# Patient Record
Sex: Female | Born: 1960 | Race: White | Hispanic: No | Smoking: Former smoker
Health system: Southern US, Community
[De-identification: ages and names within clinical notes are randomized; demographics above are authoritative.]

## PROBLEM LIST (undated history)

## (undated) DIAGNOSIS — I10 Essential (primary) hypertension: Secondary | ICD-10-CM

## (undated) DIAGNOSIS — R0600 Dyspnea, unspecified: Secondary | ICD-10-CM

## (undated) DIAGNOSIS — Z87442 Personal history of urinary calculi: Secondary | ICD-10-CM

## (undated) DIAGNOSIS — E7800 Pure hypercholesterolemia, unspecified: Secondary | ICD-10-CM

## (undated) DIAGNOSIS — J449 Chronic obstructive pulmonary disease, unspecified: Secondary | ICD-10-CM

## (undated) DIAGNOSIS — A159 Respiratory tuberculosis unspecified: Secondary | ICD-10-CM

## (undated) DIAGNOSIS — E119 Type 2 diabetes mellitus without complications: Secondary | ICD-10-CM

## (undated) DIAGNOSIS — K219 Gastro-esophageal reflux disease without esophagitis: Secondary | ICD-10-CM

## (undated) DIAGNOSIS — B1920 Unspecified viral hepatitis C without hepatic coma: Secondary | ICD-10-CM

## (undated) DIAGNOSIS — M1990 Unspecified osteoarthritis, unspecified site: Secondary | ICD-10-CM

## (undated) HISTORY — DX: Respiratory tuberculosis unspecified: A15.9

## (undated) HISTORY — DX: Pure hypercholesterolemia, unspecified: E78.00

## (undated) HISTORY — DX: Type 2 diabetes mellitus without complications: E11.9

## (undated) HISTORY — DX: Unspecified viral hepatitis C without hepatic coma: B19.20

## (undated) HISTORY — DX: Essential (primary) hypertension: I10

---

## 1995-01-29 HISTORY — PX: TUBAL LIGATION: SHX77

## 1996-11-30 HISTORY — PX: CHOLECYSTECTOMY: SHX55

## 2020-09-12 ENCOUNTER — Other Ambulatory Visit: Payer: Self-pay

## 2020-09-12 ENCOUNTER — Ambulatory Visit
Admission: RE | Admit: 2020-09-12 | Discharge: 2020-09-12 | Disposition: A | Discharge status: Home / Self Care | Payer: No Typology Code available for payment source | Source: Ambulatory Visit | Attending: Obstetrics and Gynecology | Admitting: Obstetrics and Gynecology

## 2020-09-12 ENCOUNTER — Other Ambulatory Visit: Payer: Self-pay | Admitting: Obstetrics and Gynecology

## 2020-09-12 DIAGNOSIS — A150 Tuberculosis of lung: Secondary | ICD-10-CM

## 2021-03-31 ENCOUNTER — Ambulatory Visit (INDEPENDENT_AMBULATORY_CARE_PROVIDER_SITE_OTHER): Payer: Medicaid Other | Admitting: Nurse Practitioner

## 2021-03-31 ENCOUNTER — Encounter: Payer: Self-pay | Admitting: Nurse Practitioner

## 2021-03-31 ENCOUNTER — Other Ambulatory Visit: Payer: Self-pay

## 2021-03-31 VITALS — BP 105/66 | HR 85 | Temp 98.4°F | Ht 71.0 in | Wt 169.6 lb

## 2021-03-31 DIAGNOSIS — E785 Hyperlipidemia, unspecified: Secondary | ICD-10-CM

## 2021-03-31 DIAGNOSIS — R062 Wheezing: Secondary | ICD-10-CM

## 2021-03-31 DIAGNOSIS — B373 Candidiasis of vulva and vagina: Secondary | ICD-10-CM

## 2021-03-31 DIAGNOSIS — B182 Chronic viral hepatitis C: Secondary | ICD-10-CM

## 2021-03-31 DIAGNOSIS — E119 Type 2 diabetes mellitus without complications: Secondary | ICD-10-CM

## 2021-03-31 DIAGNOSIS — E1169 Type 2 diabetes mellitus with other specified complication: Secondary | ICD-10-CM

## 2021-03-31 DIAGNOSIS — R300 Dysuria: Secondary | ICD-10-CM

## 2021-03-31 DIAGNOSIS — B3731 Acute candidiasis of vulva and vagina: Secondary | ICD-10-CM | POA: Insufficient documentation

## 2021-03-31 DIAGNOSIS — E1159 Type 2 diabetes mellitus with other circulatory complications: Secondary | ICD-10-CM | POA: Insufficient documentation

## 2021-03-31 DIAGNOSIS — E1165 Type 2 diabetes mellitus with hyperglycemia: Secondary | ICD-10-CM | POA: Insufficient documentation

## 2021-03-31 DIAGNOSIS — M25512 Pain in left shoulder: Secondary | ICD-10-CM

## 2021-03-31 DIAGNOSIS — G8929 Other chronic pain: Secondary | ICD-10-CM | POA: Insufficient documentation

## 2021-03-31 DIAGNOSIS — Z8611 Personal history of tuberculosis: Secondary | ICD-10-CM

## 2021-03-31 DIAGNOSIS — I152 Hypertension secondary to endocrine disorders: Secondary | ICD-10-CM

## 2021-03-31 DIAGNOSIS — Z7689 Persons encountering health services in other specified circumstances: Secondary | ICD-10-CM

## 2021-03-31 LAB — POCT URINALYSIS DIPSTICK
Bilirubin, UA: NEGATIVE
Blood, UA: NEGATIVE
Glucose, UA: POSITIVE — AB
Ketones, UA: NEGATIVE
Leukocytes, UA: NEGATIVE
Nitrite, UA: NEGATIVE
Protein, UA: NEGATIVE
Spec Grav, UA: 1.015 (ref 1.010–1.025)
Urobilinogen, UA: 0.2 E.U./dL
pH, UA: 5 (ref 5.0–8.0)

## 2021-03-31 LAB — POCT GLYCOSYLATED HEMOGLOBIN (HGB A1C): Hemoglobin A1C: 6.8 % — AB (ref 4.0–5.6)

## 2021-03-31 MED ORDER — GLIMEPIRIDE 4 MG PO TABS: 4.0000 mg | ORAL_TABLET | Freq: Two times a day (BID) | ORAL | 1 refills | Status: DC

## 2021-03-31 MED ORDER — FLUCONAZOLE 150 MG PO TABS: ORAL_TABLET | ORAL | 0 refills | Status: DC

## 2021-03-31 NOTE — Progress Notes (Signed)
New Patient Office Visit  Subjective:  Patient ID: Danielle Hurley, female    DOB: 02/04/1961  Age: 60 y.o. MRN: [id number]  CC:  Chief Complaint  Patient presents with  . New Patient (Initial Visit)    HPI Danielle ReddenJanet Hurley presents to establish new primary care provider. She moved to this area in 08/2020 to be closer to family, [place] in [place].  The patient was recently treated in [place] for active and now latent TB. Negative lung biopsy. Wheezing and cough noted. She denies shortness of breath. Oxygenation has been good. When first diagnosed with TB, did have biopsy of the lung with benign results. History of smoking. Quit 8 years ago. She states she has never seen a pulmonologist.  Left shoulder pain. Had [place] in [place] with tear of tendon/ limited ROM and strength due to pain in left shoulder and upper arm.  Has history of diabetes mellitus type 2. Her HgbA1c is 6.8 today. She states that in [place], she saw endocrinologist. Doing well with current medication. Needs to have Diabetic eye exam. Also has history of swelling of vessels of left eye.  Has been having  burning and pain with urination for past few weeks. Is on multiple medications per health department due to latent TB. Will be on treatment through, at least, June 2022.  She reports that she has long standing history of Hepatitis C. States that treatment for this has been on hold due to the treatment for tuberculosis. She was seeing infectious disease specialist in [place] and needs to be referred to one here.    Past Medical History:  Diagnosis Date  . Diabetes mellitus without complication (HCC)   . Hepatitis C   . High cholesterol   . Hypertension   . Tuberculosis     Past Surgical History:  Procedure Laterality Date  . CHOLECYSTECTOMY  1998  . TUBAL LIGATION  01/1995    History reviewed. No pertinent family history.  Social History   Socioeconomic History  . Marital status: Widowed    Spouse  name: Not on file  . Number of children: 4  . Years of education: Not on file  . Highest education level: 12th grade  Occupational History  . Not on file  Tobacco Use  . Smoking status: Former Smoker    Types: Cigarettes    Quit date: 2014    Years since quitting: 8.3  . Smokeless tobacco: Never Used  Vaping Use  . Vaping Use: Unknown  Substance and Sexual Activity  . Alcohol use: Yes  . Drug use: Not Currently  . Sexual activity: Not Currently  Other Topics Concern  . Not on file  Social History Narrative  . Not on file   Social Determinants of Health   Financial Resource Strain: Not on file  Food Insecurity: Not on file  Transportation Needs: Not on file  Physical Activity: Not on file  Stress: Not on file  Social Connections: Not on file  Intimate Partner Violence: Not on file    ROS Review of Systems  Constitutional: Negative for activity change, chills and fever.  HENT: Negative for congestion, postnasal drip, rhinorrhea, sinus pressure and sinus pain.   Eyes: Negative.   Respiratory: Positive for cough and wheezing. Negative for shortness of breath.   Cardiovascular: Negative for chest pain and palpitations.  Gastrointestinal: Positive for diarrhea. Negative for constipation, nausea and vomiting.       Patient states that she has diarrhea off and on since she  has been on long-term antibiotics for tuberculosis.   Endocrine: Negative for cold intolerance, heat intolerance, polydipsia and polyuria.       Blood sugars doing well   Genitourinary: Positive for dysuria, frequency and urgency.  Musculoskeletal: Positive for arthralgias. Negative for back pain and myalgias.       Left shoulder pain. Limited ROM and strength due to pain.   Skin: Negative for rash.  Allergic/Immunologic: Negative.   Neurological: Negative for dizziness, weakness and headaches.  Hematological: Negative for adenopathy.  Psychiatric/Behavioral: Negative for dysphoric mood and sleep  disturbance. The patient is not nervous/anxious.   All other systems reviewed and are negative.   Objective:   Today's Vitals   03/31/21 0938  BP: 105/66  Pulse: 85  Temp: 98.4 F (36.9 C)  SpO2: 94%  Weight: 169 lb 9.6 oz (76.9 kg)  Height: 5\' 11"  (1.803 m)   Body mass index is 23.65 kg/m.   Physical Exam Vitals and nursing note reviewed.  Constitutional:      Appearance: Normal appearance. She is well-developed.  HENT:     Head: Normocephalic and atraumatic.     Nose: Nose normal.  Eyes:     Extraocular Movements: Extraocular movements intact.     Conjunctiva/sclera: Conjunctivae normal.     Pupils: Pupils are equal, round, and reactive to light.  Neck:     Vascular: No carotid bruit.  Cardiovascular:     Rate and Rhythm: Normal rate and regular rhythm.     Pulses: Normal pulses.     Heart sounds: Normal heart sounds.  Pulmonary:     Effort: Pulmonary effort is normal.     Breath sounds: Normal breath sounds.     Comments: Congested, non-productive cough noted.  Abdominal:     Palpations: Abdomen is soft.  Genitourinary:    Comments: Urine sample positive for glucose only. No evidence of infection or other abnormalities.  Musculoskeletal:        General: Normal range of motion.       Arms:     Cervical back: Normal range of motion and neck supple.  Skin:    General: Skin is warm and dry.     Capillary Refill: Capillary refill takes less than 2 seconds.  Neurological:     General: No focal deficit present.     Mental Status: She is alert and oriented to person, place, and time.  Psychiatric:        Mood and Affect: Mood normal.        Behavior: Behavior normal.        Thought Content: Thought content normal.        Judgment: Judgment normal.     Assessment & Plan:  1. Encounter to establish care Appointment today to establish new primary care provider. Will get records, labs, images, and progress notes from previous providers to update chart.   2.  Diabetes mellitus without complication (HCC) HgbA1c is 6.8 today. Patient to continue all diabetic medication as prescribed. Refer for diabetic eye exam.  - POCT HgB A1C - Ambulatory referral to Ophthalmology - glimepiride (AMARYL) 4 MG tablet; Take 1 tablet (4 mg total) by mouth 2 (two) times daily.  Dispense: 180 tablet; Refill: 1  3. Hypertension associated with diabetes (HCC) Stable. Continue bp medication as prescribed.   4. Hyperlipidemia associated with type 2 diabetes mellitus (HCC) Continue atorvastatin as prescribed   5. History of active tuberculosis Patient routinely followed per [facility] to ensure proper  medication administration and compliance. Will refer to pulmonology and infectious disease for surveillance.  - Ambulatory referral to Pulmonology - Ambulatory referral to Infectious Disease  6. Wheezing Wheezing, mostly at night, has developed since diagnosed with tuberculosis. Oxygenation within normal limits. Will refer to pulmonology for further evaluation and treatment.  - Ambulatory referral to Pulmonology  7. Chronic hepatitis C without hepatic coma (HCC) Treatment for hepatitis c on hold since currently being treated for tuberculosis. Refer to infectious disease for further evaluation and treatment.  - Ambulatory referral to Infectious Disease  8. Chronic left shoulder pain Patient has recent MRI results showing tendon tear in left upper arm. Has limited ROM and strength in left arm. Will refer to orthopedics for further evaluation and treatment.  - Ambulatory referral to Orthopedic Surgery  9. Dysuria Urine dip positive for glucose only. Will treat as yeast infection. - POCT urinalysis dipstick  10. Vaginal yeast infection No active UTI at this time. Will treat for yeast infection due to symptoms and recent strong antibiotic use. Take diflucan 150mg  once. May repeat in three days for persistent symptoms.  - fluconazole (DIFLUCAN) 150  MG tablet; Take 1 tablet po once. May repeat dose in 3 days as needed for persistent symptoms.  Dispense: 3 tablet; Refill: 0  Problem List Items Addressed This Visit      Cardiovascular and Mediastinum   Hypertension associated with diabetes (HCC)   Relevant Medications   lisinopril (ZESTRIL) 20 MG tablet   SEGLUROMET 7.03-999 MG TABS   atorvastatin (LIPITOR) 20 MG tablet   glimepiride (AMARYL) 4 MG tablet     Digestive   Chronic hepatitis C without hepatic coma (HCC)   Relevant Medications   isoniazid (NYDRAZID) 300 MG tablet   rifampin (RIFADIN) 300 MG capsule   fluconazole (DIFLUCAN) 150 MG tablet   Other Relevant Orders   Ambulatory referral to Infectious Disease     Endocrine   Diabetes mellitus without complication (HCC)   Relevant Medications   lisinopril (ZESTRIL) 20 MG tablet   SEGLUROMET 7.03-999 MG TABS   atorvastatin (LIPITOR) 20 MG tablet   glimepiride (AMARYL) 4 MG tablet   Other Relevant Orders   POCT HgB A1C (Completed)   Ambulatory referral to Ophthalmology   Hyperlipidemia associated with type 2 diabetes mellitus (HCC)   Relevant Medications   lisinopril (ZESTRIL) 20 MG tablet   SEGLUROMET 7.03-999 MG TABS   atorvastatin (LIPITOR) 20 MG tablet   glimepiride (AMARYL) 4 MG tablet     Genitourinary   Vaginal yeast infection   Relevant Medications   fluconazole (DIFLUCAN) 150 MG tablet     Other   Encounter to establish care - Primary   History of active tuberculosis   Relevant Orders   Ambulatory referral to Pulmonology   Ambulatory referral to Infectious Disease   Wheezing   Relevant Orders   Ambulatory referral to Pulmonology   Chronic left shoulder pain   Relevant Orders   Ambulatory referral to Orthopedic Surgery   Dysuria   Relevant Orders   POCT urinalysis dipstick (Completed)      Outpatient Encounter Medications as of 03/31/2021  Medication Sig  . atorvastatin (LIPITOR) 20 MG tablet Take 0.5 tablets by mouth daily.  .  calcium-vitamin D (OSCAL WITH D) 500-200 MG-UNIT tablet Take 1 tablet by mouth.  . fluconazole (DIFLUCAN) 150 MG tablet Take 1 tablet po once. May repeat dose in 3 days as needed for persistent symptoms.  05/31/2021 isoniazid (NYDRAZID) 300 MG tablet Take 300  mg by mouth daily. Take 1 tablet by mouth daily  . lisinopril (ZESTRIL) 20 MG tablet Take 1 tablet by mouth daily.  . Multiple Vitamin (MULTIVITAMIN) tablet Take 1 tablet by mouth daily.  . pantoprazole (PROTONIX) 40 MG tablet Take 1 tablet by mouth daily.  Marland Kitchen pyridOXINE (VITAMIN B-6) 50 MG tablet Take 50 mg by mouth daily. Take 2 tablets by mouth daily  . rifampin (RIFADIN) 300 MG capsule Take 300 mg by mouth daily. Take 2 capsules by mouth daily  . SEGLUROMET 7.03-999 MG TABS Take 1 tablet by mouth 2 (two) times daily.  . [DISCONTINUED] glimepiride (AMARYL) 4 MG tablet Take 4 mg by mouth 2 (two) times daily.  Marland Kitchen glimepiride (AMARYL) 4 MG tablet Take 1 tablet (4 mg total) by mouth 2 (two) times daily.   No facility-administered encounter medications on file as of 03/31/2021.    Follow-up: Return in 3 months (on 07/01/2021) for will need to get progress notes, labs, images from prior PCP in [place]. TY..   Carlean Jews, NP

## 2021-03-31 NOTE — Progress Notes (Signed)
Discussed with patient during visit.

## 2021-03-31 NOTE — Patient Instructions (Signed)
Vaginal Yeast Infection, Adult  Vaginal yeast infection is a condition that causes vaginal discharge as well as soreness, swelling, and redness (inflammation) of the vagina. This is a common condition. Some women get this infection frequently. What are the causes? This condition is caused by a change in the normal balance of the yeast (candida) and bacteria that live in the vagina. This change causes an overgrowth of yeast, which causes the inflammation. What increases the risk? The condition is more likely to develop in women who:  Take antibiotic medicines.  Have diabetes.  Take birth control pills.  Are pregnant.  Douche often.  Have a weak body defense system (immune system).  Have been taking steroid medicines for a long time.  Frequently wear tight clothing. What are the signs or symptoms? Symptoms of this condition include:  White, thick, creamy vaginal discharge.  Swelling, itching, redness, and irritation of the vagina. The lips of the vagina (vulva) may be affected as well.  Pain or a burning feeling while urinating.  Pain during sex. How is this diagnosed? This condition is diagnosed based on:  Your medical history.  A physical exam.  A pelvic exam. Your health care provider will examine a sample of your vaginal discharge under a microscope. Your health care provider may send this sample for testing to confirm the diagnosis. How is this treated? This condition is treated with medicine. Medicines may be over-the-counter or prescription. You may be told to use one or more of the following:  Medicine that is taken by mouth (orally).  Medicine that is applied as a cream (topically).  Medicine that is inserted directly into the vagina (suppository). Follow these instructions at home: Lifestyle  Do not have sex until your health care provider approves. Tell your sex partner that you have a yeast infection. That person should go to his or her health care  provider and ask if they should also be treated.  Do not wear tight clothes, such as pantyhose or tight pants.  Wear breathable cotton underwear. General instructions  Take or apply over-the-counter and prescription medicines only as told by your health care provider.  Eat more yogurt. This may help to keep your yeast infection from returning.  Do not use tampons until your health care provider approves.  Try taking a sitz bath to help with discomfort. This is a warm water bath that is taken while you are sitting down. The water should only come up to your hips and should cover your buttocks. Do this 3-4 times per day or as told by your health care provider.  Do not douche.  If you have diabetes, keep your blood sugar levels under control.  Keep all follow-up visits as told by your health care provider. This is important.   Contact a health care provider if:  You have a fever.  Your symptoms go away and then return.  Your symptoms do not get better with treatment.  Your symptoms get worse.  You have new symptoms.  You develop blisters in or around your vagina.  You have blood coming from your vagina and it is not your menstrual period.  You develop pain in your abdomen. Summary  Vaginal yeast infection is a condition that causes discharge as well as soreness, swelling, and redness (inflammation) of the vagina.  This condition is treated with medicine. Medicines may be over-the-counter or prescription.  Take or apply over-the-counter and prescription medicines only as told by your health care provider.  Do not   douche. Do not have sex or use tampons until your health care provider approves.  Contact a health care provider if your symptoms do not get better with treatment or your symptoms go away and then return. This information is not intended to replace advice given to you by your health care provider. Make sure you discuss any questions you have with your health care  provider. Document Revised: 06/16/2019 Document Reviewed: 04/04/2018 Elsevier Patient Education  2021 Elsevier Inc.  

## 2021-04-08 ENCOUNTER — Other Ambulatory Visit: Payer: Self-pay

## 2021-04-08 ENCOUNTER — Encounter: Payer: Self-pay | Admitting: Infectious Diseases

## 2021-04-08 ENCOUNTER — Ambulatory Visit (INDEPENDENT_AMBULATORY_CARE_PROVIDER_SITE_OTHER): Payer: Self-pay | Admitting: Infectious Diseases

## 2021-04-08 VITALS — BP 135/79 | HR 92 | Resp 16 | Ht 71.0 in | Wt 170.0 lb

## 2021-04-08 DIAGNOSIS — B182 Chronic viral hepatitis C: Secondary | ICD-10-CM

## 2021-04-08 NOTE — Progress Notes (Signed)
[facility]                                                             [address], [place], [place], [address]                                                                  Phn. [phone]; Fax: [phone]                                                                             Date: 04/08/21  Reason for Referral: Chronic Hepatitis C Requesting  Provider: Vincent Gros  Assessment Chronic Hepatitis C Pulmonary Tuberculosis   Plan Will get following labs for hepatitis C tx planning Will get records from [facility] Department regarding details of Pulmonary TB tx.  Fu in 3-4 weeks for discussion of lab results and plan tx  Orders Placed This Encounter  Procedures  . US ABDOMEN COMPLETE W/ELASTOGRAPHY  . CBC  . Hepatic function panel  . Hepatitis C genotype  . Hepatitis C antibody  . Hepatitis C RNA quantitative  . Liver Fibrosis, FibroTest-ActiTest  . Protime-INR  . Hepatitis B core antibody, total  . Hepatitis B surface antibody,qualitative  . Hepatitis B surface antigen  . HIV antibody (with reflex)    All questions and concerns were discussed and addressed. Patient verbalized understanding of the plan. ____________________________________________________________________________________________________________________  HPI: 60 year old female with PMH of DM, HTN, HLD, TB on tx with [facility] who is referred for evaluation and management of Hepatitis C. She was diagnosed with Hepatitis C in 2008 but she did not find it out until 2014 when her girlfriend saw it on on of her medical records. 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 Her ex-husband also had Hepatitis C ( unsure if it was treated). Denies h/o blood transfusion, has tattoos in her body. Denies any personal or family h/o liver diseases or Liver cancer. Denies tx to  date. She is interested for Hepatitis C treatment.   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 She was on [facility] tx since feb 2021. She says she was on home isolation until October 2021 and was released from isolation precautions at that time. She then moved to [facility] in October 2021 and continued to follow with [facility] for her TB care. She has been currently taking Isoniazid/Pyridoxine and Rifampin. 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 She has been tolerating her  ATT well without any issues.   She complains of diarrhea for few years. She says she has loose stool at least three times a week. Denies any hematochezia, pain or burning. Denies nausea/vomiting. Abdominal pain is occasiona. Denies cough, chest pain and SOB. Denies GU symptoms. Denies rashes or joint complaints.  Denies recent hospital admission for liver diseases.  She is seeing an Orthopedics for tear in her rt arm and also has been referred to see a pulomonologist.   Social - says she is adopted, Quit smoking 8 years ago, drinks alcohol once in a while. 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 Has not been sexually active for 8 years. She lives with her brother who is deaf and takes care of her. She has 1 adult son and 3 adult daughters.  ROS: 12 point ROS done with pertinent positives and negative as listed above   Past Medical History:  Diagnosis Date  . Diabetes mellitus without complication (HCC)   . Hepatitis C   . High cholesterol   . Hypertension   . Tuberculosis    non contagious Dx 05/2020    Past Surgical History:  Procedure Laterality Date  . CHOLECYSTECTOMY  1998  . TUBAL LIGATION  01/1995   Current Outpatient Medications on File Prior to Visit  Medication Sig Dispense Refill  . atorvastatin (LIPITOR) 20  MG tablet Take 0.5 tablets by mouth daily.    . calcium-vitamin D (OSCAL WITH D) 500-200 MG-UNIT tablet Take 1 tablet by mouth.    Marland Kitchen glimepiride (AMARYL) 4 MG tablet Take 1 tablet (4 mg total) by mouth 2 (two) times daily. 180 tablet 1  . isoniazid (NYDRAZID) 300 MG tablet Take 300 mg by mouth daily. Take 1 tablet by mouth daily    . lisinopril (ZESTRIL) 20 MG tablet Take 1 tablet by mouth daily.    . pantoprazole (PROTONIX) 40 MG tablet Take 1 tablet by mouth daily.    Marland Kitchen pyridOXINE (VITAMIN B-6) 50 MG tablet Take 50 mg by mouth daily. Take 2 tablets by mouth daily    . rifampin (RIFADIN) 300 MG capsule Take 300 mg by mouth daily. Take 2 capsules by mouth daily    . SEGLUROMET 7.03-999 MG TABS Take 1 tablet by mouth 2 (two) times daily.     No current facility-administered medications on file prior to visit.   No Known Allergies  Social History   Socioeconomic History  . Marital status: Widowed    Spouse name: Not on file  . Number of children: 4  . Years of education: Not on file  . Highest education level: 12th grade  Occupational History  . Not on file  Tobacco Use  . Smoking status: Former Smoker    Types: Cigarettes    Quit date: 2014    Years since quitting: 8.3  . Smokeless tobacco: Never Used  Vaping Use  . Vaping Use: Unknown  Substance and Sexual Activity  . Alcohol use: Yes  . Drug use: Not Currently  . Sexual activity: Not Currently  Other Topics Concern  . Not on file  Social History Narrative  . Not on file   Social Determinants of Health   Financial Resource Strain: Not on file  Food Insecurity: Not on file  Transportation Needs: Not on file  Physical Activity: Not on file  Stress: Not on file  Social Connections: Not on file  Intimate Partner Violence: Not on file    Vitals BP 135/79   Pulse 92  Resp 16   Ht 5\' 11"  (1.803 m)   Wt 170 lb (77.1 kg)   SpO2 95%   BMI 23.71 kg/m    Examination  General - not in acute distress, comfortably  sitting in chair HEENT - PEERLA, no pallor and no icterus Chest - b/l clear air entry, no additional sounds CVS- Normal s1s2, RRR Abdomen - Soft, Non tender , non distended Ext- no pedal edema Neuro: grossly normal Back - WNL Psych : calm and cooperative   Pertinent Imaging All pertinent labs/Imagings/notes reviewed. All pertinent plain films and CT images have been personally visualized and interpreted; radiology reports have been reviewed. Decision making incorporated into the Impression / Recommendations.  I have spent 60 minutes for this patient encounter including  review of prior medical records with greater than 50% of time in face to face counsel of the patient/discussing diagnostics and plan of care.   Electronically signed by:  , MD Infectious Disease Physician [facility] [address] [place], [place] Phone: [phone]  Fax: [phone]

## 2021-04-10 ENCOUNTER — Ambulatory Visit: Payer: Self-pay

## 2021-04-10 ENCOUNTER — Ambulatory Visit (INDEPENDENT_AMBULATORY_CARE_PROVIDER_SITE_OTHER): Payer: Medicaid Other | Admitting: Orthopaedic Surgery

## 2021-04-10 ENCOUNTER — Ambulatory Visit (INDEPENDENT_AMBULATORY_CARE_PROVIDER_SITE_OTHER): Payer: Self-pay

## 2021-04-10 DIAGNOSIS — G8929 Other chronic pain: Secondary | ICD-10-CM

## 2021-04-10 DIAGNOSIS — M25512 Pain in left shoulder: Secondary | ICD-10-CM

## 2021-04-10 NOTE — Progress Notes (Signed)
Office Visit Note   Patient: Danielle Hurley           Date of Birth: February 14, 1961           MRN: [id number] Visit Date: 04/10/2021              Requested by: Carlean Jews, NP [address] [place],  [place] [address] PCP: Carlean Jews, NP   Assessment & Plan: Visit Diagnoses:  1. Chronic left shoulder pain     Plan: Impression is left shoulder partial articular surface tear supraspinatus and mild infraspinatus and subscapularis tendinosis with underlying glenohumeral arthritis and adhesive capsulitis.  At this point, I recommended left shoulder glenohumeral cortisone injection by Dr. Prince Rome in addition to a course of physical therapy.  She will follow-up with [place] in 6 weeks time for recheck.  Call with concerns or questions in the meantime.  Follow-Up Instructions: Return in about 6 weeks (around 05/22/2021).   Orders:  Orders Placed This Encounter  Procedures  . XR Shoulder Left   No orders of the defined types were placed in this encounter.     Procedures: No procedures performed   Clinical Data: No additional findings.   Subjective: Chief Complaint  Patient presents with  . Left Shoulder - Pain    HPI is a pleasant 60 year old female who comes in today with left shoulder pain.  This is been ongoing for the past 3 years following a tetanus and flu shot to the arm.  No other injury or change in activity that she can think of.  The pain is from the top of her shoulder and radiates down to the deltoid and into the hand at times.  Any use of the shoulder as well as sleeping on the affected side seems to aggravate her symptoms most.  She has been taking ibuprofen and an occasional oxycodone with mild relief.  She does note occasional numbness and tingling to the left hand.  She has not previously undergone cortisone injection to left shoulder or physical therapy.  She has recently moved from [place] where she previously underwent MRI of the left shoulder back  in September 2021.  MRI showed moderate supraspinatus tendinosis with fraying at the distal articular surface with a ill-defined low-grade partial-thickness articular surface tear, mild infraspinatus and subscapularis tendinosis, mild chondral thinning inferior glenoid and mild AC joint arthritis.  Review of Systems as detailed in HPI.  All others reviewed and are negative.   Objective: Vital Signs: There were no vitals taken for this visit.  Physical Exam well-developed well-nourished female no acute distress.  Alert and oriented x3.  Ortho Exam examination of the left shoulder reveals forward flexion to approximately 75 degrees, external rotation to 45 degrees and internal rotation to her back pocket.  Positive empty can.  4 out of 5 strength throughout.  Specialty Comments:  No specialty comments available.  Imaging: XR Shoulder Left  Result Date: 04/10/2021 No acute or structural abnormalities    PMFS History: Patient Active Problem List   Diagnosis Date Noted  . Hypertension associated with diabetes (HCC) 03/31/2021  . Diabetes mellitus without complication (HCC) 03/31/2021  . Hyperlipidemia associated with type 2 diabetes mellitus (HCC) 03/31/2021  . Encounter to establish care 03/31/2021  . History of active tuberculosis 03/31/2021  . Wheezing 03/31/2021  . Chronic hepatitis C without hepatic coma (HCC) 03/31/2021  . Chronic left shoulder pain 03/31/2021  . Dysuria 03/31/2021  . Vaginal yeast infection 03/31/2021  Past Medical History:  Diagnosis Date  . Diabetes mellitus without complication (HCC)   . Hepatitis C   . High cholesterol   . Hypertension   . Tuberculosis    non contagious Dx 05/2020    No family history on file.  Past Surgical History:  Procedure Laterality Date  . CHOLECYSTECTOMY  1998  . TUBAL LIGATION  01/1995   Social History   Occupational History  . Not on file  Tobacco Use  . Smoking status: Former Smoker    Types: Cigarettes     Quit date: 2014    Years since quitting: 8.3  . Smokeless tobacco: Never Used  Vaping Use  . Vaping Use: Unknown  Substance and Sexual Activity  . Alcohol use: Yes  . Drug use: Not Currently  . Sexual activity: Not Currently

## 2021-04-10 NOTE — Progress Notes (Signed)
Subjective: Patient is here for ultrasound-guided intra-articular left glenohumeral injection.  Has frozen shoulder.  Objective:  Very limited active and passive ROM.  Procedure: Ultrasound guided injection is preferred based studies that show increased duration, increased effect, greater accuracy, decreased procedural pain, increased response rate, and decreased cost with ultrasound guided versus blind injection.   Verbal informed consent obtained.  Time-out conducted.  Noted no overlying erythema, induration, or other signs of local infection. Ultrasound-guided left glenohumeral injection: After sterile prep with Betadine, injected 4 cc 0.25% bupivocaine without epinephrine and 6 mg betamethasone using a 22-gauge spinal needle, passing the needle from posterior approach into the glenohumeral joint.  Injectate seen filling joint capsule.

## 2021-04-11 ENCOUNTER — Ambulatory Visit: Payer: Medicaid Other | Admitting: Orthopaedic Surgery

## 2021-04-11 LAB — CBC
HCT: 39.4 % (ref 35.0–45.0)
Hemoglobin: 13.2 g/dL (ref 11.7–15.5)
MCH: 30.1 pg (ref 27.0–33.0)
MCHC: 33.5 g/dL (ref 32.0–36.0)
MCV: 90 fL (ref 80.0–100.0)
MPV: 11.1 fL (ref 7.5–12.5)
Platelets: 201 10*3/uL (ref 140–400)
RBC: 4.38 10*6/uL (ref 3.80–5.10)
RDW: 14 % (ref 11.0–15.0)
WBC: 7.3 10*3/uL (ref 3.8–10.8)

## 2021-04-11 LAB — HEPATITIS B SURFACE ANTIGEN: Hepatitis B Surface Ag: NONREACTIVE

## 2021-04-11 LAB — HEPATITIS C RNA QUANTITATIVE
HCV Quantitative Log: 7.51 log IU/mL — ABNORMAL HIGH
HCV RNA, PCR, QN: 32500000 IU/mL — ABNORMAL HIGH

## 2021-04-11 LAB — LIVER FIBROSIS, FIBROTEST-ACTITEST
ALT: 29 U/L (ref 6–29)
Alpha-2-Macroglobulin: 363 mg/dL — ABNORMAL HIGH (ref 106–279)
Apolipoprotein A1: 161 mg/dL (ref 101–198)
Bilirubin: 0.6 mg/dL (ref 0.2–1.2)
Fibrosis Score: 0.69
GGT: 31 U/L (ref 3–65)
Haptoglobin: 30 mg/dL — ABNORMAL LOW (ref 43–212)
Necroinflammat ACT Score: 0.23
Reference ID: 3862820

## 2021-04-11 LAB — HEPATIC FUNCTION PANEL
AG Ratio: 1.2 (calc) (ref 1.0–2.5)
ALT: 30 U/L — ABNORMAL HIGH (ref 6–29)
AST: 37 U/L — ABNORMAL HIGH (ref 10–35)
Albumin: 4.1 g/dL (ref 3.6–5.1)
Alkaline phosphatase (APISO): 46 U/L (ref 37–153)
Bilirubin, Direct: 0.2 mg/dL (ref 0.0–0.2)
Globulin: 3.5 g/dL (calc) (ref 1.9–3.7)
Indirect Bilirubin: 0.4 mg/dL (calc) (ref 0.2–1.2)
Total Bilirubin: 0.6 mg/dL (ref 0.2–1.2)
Total Protein: 7.6 g/dL (ref 6.1–8.1)

## 2021-04-11 LAB — HEPATITIS C GENOTYPE

## 2021-04-11 LAB — PROTIME-INR
INR: 1
Prothrombin Time: 9.9 s (ref 9.0–11.5)

## 2021-04-11 LAB — HEPATITIS B CORE ANTIBODY, TOTAL: Hep B Core Total Ab: NONREACTIVE

## 2021-04-11 LAB — HEPATITIS C ANTIBODY
Hepatitis C Ab: REACTIVE — AB
SIGNAL TO CUT-OFF: 27.9 — ABNORMAL HIGH (ref <1.00)

## 2021-04-11 LAB — HIV ANTIBODY (ROUTINE TESTING W REFLEX): HIV 1&2 Ab, 4th Generation: NONREACTIVE

## 2021-04-11 LAB — HEPATITIS B SURFACE ANTIBODY,QUALITATIVE: Hep B S Ab: NONREACTIVE

## 2021-04-14 ENCOUNTER — Ambulatory Visit (HOSPITAL_COMMUNITY)
Admission: RE | Admit: 2021-04-14 | Discharge: 2021-04-14 | Disposition: A | Discharge status: Home / Self Care | Payer: Self-pay | Source: Ambulatory Visit | Attending: Infectious Diseases | Admitting: Infectious Diseases

## 2021-04-14 ENCOUNTER — Other Ambulatory Visit: Payer: Self-pay

## 2021-04-14 DIAGNOSIS — B182 Chronic viral hepatitis C: Secondary | ICD-10-CM | POA: Insufficient documentation

## 2021-04-25 ENCOUNTER — Other Ambulatory Visit: Payer: Self-pay

## 2021-04-25 ENCOUNTER — Ambulatory Visit
Admission: RE | Admit: 2021-04-25 | Discharge: 2021-04-25 | Disposition: A | Discharge status: Home / Self Care | Payer: No Typology Code available for payment source | Source: Ambulatory Visit | Attending: Obstetrics and Gynecology | Admitting: Obstetrics and Gynecology

## 2021-04-25 ENCOUNTER — Other Ambulatory Visit: Payer: Self-pay | Admitting: Obstetrics and Gynecology

## 2021-04-25 DIAGNOSIS — Z09 Encounter for follow-up examination after completed treatment for conditions other than malignant neoplasm: Secondary | ICD-10-CM

## 2021-05-08 ENCOUNTER — Other Ambulatory Visit: Payer: Self-pay

## 2021-05-08 ENCOUNTER — Ambulatory Visit (INDEPENDENT_AMBULATORY_CARE_PROVIDER_SITE_OTHER): Payer: Self-pay | Admitting: Internal Medicine

## 2021-05-08 ENCOUNTER — Encounter: Payer: Self-pay | Admitting: Internal Medicine

## 2021-05-08 DIAGNOSIS — R058 Other specified cough: Secondary | ICD-10-CM | POA: Insufficient documentation

## 2021-05-08 DIAGNOSIS — I152 Hypertension secondary to endocrine disorders: Secondary | ICD-10-CM

## 2021-05-08 DIAGNOSIS — E1159 Type 2 diabetes mellitus with other circulatory complications: Secondary | ICD-10-CM

## 2021-05-08 DIAGNOSIS — A150 Tuberculosis of lung: Secondary | ICD-10-CM

## 2021-05-08 MED ORDER — VALSARTAN 160 MG PO TABS: 160.0000 mg | ORAL_TABLET | Freq: Every day | ORAL | 11 refills | Status: DC

## 2021-05-08 NOTE — Assessment & Plan Note (Signed)
rx March 2021 -May 2022 with INH/Rif - still egg sized cavity with a/f levels on cxr at end of RX Apr 25 2021   rec repeat sputum off RX, may need FOB and/or excision of what amounts to localized bronchiectasis with likelihood of reinfection for decades.  Discussed in detail all the  indications, usual  risks and alternatives  relative to the benefits with patient who agrees to proceed with conservative f/u as outlined           Each maintenance medication was reviewed in detail including emphasizing most importantly the difference between maintenance and prns and under what circumstances the prns are to be triggered using an action plan format where appropriate.  Total time for H and P, chart review, counseling, reviewing  and generating customized AVS unique to this office visit / same day charting = 32 min

## 2021-05-08 NOTE — Assessment & Plan Note (Addendum)
Onset 2020  > d/c acei 05/08/2021  Upper airway cough syndrome (previously labeled PNDS),  is so named because it's frequently impossible to sort out how much is  CR/sinusitis with freq throat clearing (which can be related to primary GERD)   vs  causing  secondary (" extra esophageal")  GERD from wide swings in gastric pressure that occur with throat clearing, often  promoting self use of mint and menthol lozenges that reduce the lower esophageal sphincter tone and exacerbate the problem further in a cyclical fashion.   These are the same pts (now being labeled as having "irritable larynx syndrome" by some cough centers) who not infrequently have a history of having failed to tolerate ace inhibitors,  dry powder inhalers or biphosphonates or report having atypical/extraesophageal reflux symptoms that don't respond to standard doses of PPI  and are easily confused as having aecopd or asthma flares by even experienced allergists/ pulmonologists (myself included).   Try off acei first

## 2021-05-08 NOTE — Progress Notes (Signed)
Danielle Hurley, female    DOB: 10-17-1961,   MRN: [id number]   Brief patient profile:  57 yowf from [place] quit smoking in 2014 due to cough which resolved despite Lisinopril use but then started coughing again Nov 2020 > w/u in [place] with initial dx of coxy then tb and treated there completed end of May 22 with rif / inh with neg results for TB oc 2021 but persisently cxr and clear mucus production    History of Present Illness  05/08/2021  Pulmonary/ 1st office eval/Kamela Blansett  Chief Complaint  Patient presents with   Pulmonary Consult    Referred by Vincent Gros, NP. Pt states dx with TB Feb 2021- just finished isonazid, B6 and rifampin therapy that she was on for a year. She states had a cough off and on but this has been worse x 2 months- prod with clear sputum.       Dyspnea: MMRC1 = can walk nl pace, flat grade, can't hurry or go uphills or steps s sob   Cough: at hs clear mucus / doesn't keep her up but the "wheeze" does / no change since on TB rx  Sleep: bed flat / one pillow  SABA use: not using/ did help much   Past Medical History:  Diagnosis Date   Diabetes mellitus without complication (HCC)    Hepatitis C    High cholesterol    Hypertension    Tuberculosis    non contagious Dx 05/2020    Outpatient Medications Prior to Visit  Medication Sig Dispense Refill   aspirin-acetaminophen-caffeine (EXCEDRIN MIGRAINE) 250-250-65 MG tablet Take 1 tablet by mouth every 6 (six) hours as needed for headache.     atorvastatin (LIPITOR) 20 MG tablet Take 0.5 tablets by mouth daily.     calcium carbonate (TUMS - DOSED IN MG ELEMENTAL CALCIUM) 500 MG chewable tablet Chew 1 tablet by mouth daily.     calcium-vitamin D (OSCAL WITH D) 500-200 MG-UNIT tablet Take 1 tablet by mouth.     glimepiride (AMARYL) 4 MG tablet Take 1 tablet (4 mg total) by mouth 2 (two) times daily. 180 tablet 1   lisinopril (ZESTRIL) 20 MG tablet Take 1 tablet by mouth daily.     pantoprazole (PROTONIX)  40 MG tablet Take 1 tablet by mouth daily.     SEGLUROMET 7.03-999 MG TABS Take 1 tablet by mouth 2 (two) times daily.     simethicone (GAS-X) 80 MG chewable tablet Chew 80 mg by mouth every 6 (six) hours as needed for flatulence.     sodium polystyrene (KAYEXALATE) 15 GM/60ML suspension Take 15 g by mouth as needed.     isoniazid (NYDRAZID) 300 MG tablet Take 300 mg by mouth daily. Take 1 tablet by mouth daily     pyridOXINE (VITAMIN B-6) 50 MG tablet Take 50 mg by mouth daily. Take 2 tablets by mouth daily     rifampin (RIFADIN) 300 MG capsule Take 300 mg by mouth daily. Take 2 capsules by mouth daily     No facility-administered medications prior to visit.     Objective:     BP 112/74 (BP Location: Left Arm, Cuff Size: Normal)   Pulse 74   Temp (!) 97.2 F (36.2 C) (Temporal)   Ht 5\' 11"  (1.803 m)   Wt 170 lb 12.8 oz (77.5 kg)   SpO2 99% Comment: on RA  BMI 23.82 kg/m   SpO2: 99 % (on RA)  Amb wf nad   HEENT :  pt wearing mask not removed for exam due to covid -19 concerns.    NECK :  without JVD/Nodes/TM/ nl carotid upstrokes bilaterally   LUNGS: no acc muscle use,  Nl contour chest which is clear to A and P bilaterally without cough on insp or exp maneuvers   CV:  RRR  no s3 or murmur or increase in P2, and no edema   ABD:  soft and nontender with nl inspiratory excursion in the supine position. No bruits or organomegaly appreciated, bowel sounds nl  MS:  Nl gait/ ext warm without deformities, calf tenderness, cyanosis or clubbing No obvious joint restrictions   SKIN: warm and dry without lesions    NEURO:  alert, approp, nl sensorium with  no motor or cerebellar deficits apparent.       I personally reviewed images and agree with radiology impression as follows:  CXR:   04/25/21 1. Persistent areas of nodularity and cavitation in the left lung, compatible with reported tuberculosis infection. Air-fluid level could suggest superinfection of the pre-existing  cavity.      Assessment   Upper airway cough syndrome Onset 2020  > d/c acei 05/08/2021  Upper airway cough syndrome (previously labeled PNDS),  is so named because it's frequently impossible to sort out how much is  CR/sinusitis with freq throat clearing (which can be related to primary GERD)   vs  causing  secondary (" extra esophageal")  GERD from wide swings in gastric pressure that occur with throat clearing, often  promoting self use of mint and menthol lozenges that reduce the lower esophageal sphincter tone and exacerbate the problem further in a cyclical fashion.   These are the same pts (now being labeled as having "irritable larynx syndrome" by some cough centers) who not infrequently have a history of having failed to tolerate ace inhibitors,  dry powder inhalers or biphosphonates or report having atypical/extraesophageal reflux symptoms that don't respond to standard doses of PPI  and are easily confused as having aecopd or asthma flares by even experienced allergists/ pulmonologists (myself included).   Try off acei first   TB of lung w/ cavitation rx March 2021 -May 2022 with INH/Rif - still egg sized cavity with a/f levels on cxr at end of RX Apr 25 2021   rec repeat sputum off RX, may need FOB and/or excision of what amounts to localized bronchiectasis with likelihood of reinfection for decades.  Discussed in detail all the  indications, usual  risks and alternatives  relative to the benefits with patient who agrees to proceed with conservative f/u as outlined           Each maintenance medication was reviewed in detail including emphasizing most importantly the difference between maintenance and prns and under what circumstances the prns are to be triggered using an action plan format where appropriate.  Total time for H and P, chart review, counseling, reviewing  and generating customized AVS unique to this office visit / same day charting = 32 min       Hypertension  associated with diabetes (HCC) D/c acei 05/08/2021   ACE inhibitors are problematic in  pts with airway complaints because  even experienced pulmonologists can't always distinguish ace effects from copd/asthma.  By themselves they don't actually cause a problem, much like oxygen can't by itself start a fire, but they certainly serve as a powerful catalyst or enhancer for any "fire"  or inflammatory process in the upper airway, be it caused by an  ET  tube or more commonly reflux (especially in the obese or pts with known GERD or who are on biphoshonates).    In the era of ARB near equivalency until we have a better handle on the reversibility of the airway problem, it just makes sense to avoid ACEI  entirely in the short run and then decide later, having established a level of airway control using a reasonable limited regimen, whether to add back ace but even then being very careful to observe the pt for worsening airway control and number of meds used/ needed to control symptoms.   >>> try diovan 160 mg daily           Each maintenance medication was reviewed in detail including emphasizing most importantly the difference between maintenance and prns and under what circumstances the prns are to be triggered using an action plan format where appropriate.  Total time for H and P, chart review, counseling and generating customized AVS unique to this office visit / same day charting = 32 min          Sandrea Hughs, MD 05/08/2021

## 2021-05-08 NOTE — Assessment & Plan Note (Signed)
D/c acei 05/08/2021   ACE inhibitors are problematic in  pts with airway complaints because  even experienced pulmonologists can't always distinguish ace effects from copd/asthma.  By themselves they don't actually cause a problem, much like oxygen can't by itself start a fire, but they certainly serve as a powerful catalyst or enhancer for any "fire"  or inflammatory process in the upper airway, be it caused by an ET  tube or more commonly reflux (especially in the obese or pts with known GERD or who are on biphoshonates).    In the era of ARB near equivalency until we have a better handle on the reversibility of the airway problem, it just makes sense to avoid ACEI  entirely in the short run and then decide later, having established a level of airway control using a reasonable limited regimen, whether to add back ace but even then being very careful to observe the pt for worsening airway control and number of meds used/ needed to control symptoms.   >>> try diovan 160 mg daily           Each maintenance medication was reviewed in detail including emphasizing most importantly the difference between maintenance and prns and under what circumstances the prns are to be triggered using an action plan format where appropriate.  Total time for H and P, chart review, counseling and generating customized AVS unique to this office visit / same day charting = 32 min

## 2021-05-08 NOTE — Patient Instructions (Addendum)
Stop lisinopril and start valsartan 160 mg one daily   I would continue isolation as you are   I am deferring the follow up sputum studies to your ID doctor   Please schedule a follow up office visit in 6 weeks, call sooner if needed

## 2021-05-13 ENCOUNTER — Telehealth: Payer: Self-pay | Admitting: Pharmacist

## 2021-05-13 ENCOUNTER — Telehealth: Payer: Self-pay

## 2021-05-13 ENCOUNTER — Ambulatory Visit (INDEPENDENT_AMBULATORY_CARE_PROVIDER_SITE_OTHER): Payer: 59 | Admitting: Infectious Diseases

## 2021-05-13 ENCOUNTER — Other Ambulatory Visit: Payer: Self-pay

## 2021-05-13 ENCOUNTER — Encounter: Payer: Self-pay | Admitting: Infectious Diseases

## 2021-05-13 ENCOUNTER — Other Ambulatory Visit (HOSPITAL_COMMUNITY): Payer: Self-pay

## 2021-05-13 DIAGNOSIS — Z23 Encounter for immunization: Secondary | ICD-10-CM

## 2021-05-13 DIAGNOSIS — A150 Tuberculosis of lung: Secondary | ICD-10-CM | POA: Diagnosis not present

## 2021-05-13 DIAGNOSIS — Z129 Encounter for screening for malignant neoplasm, site unspecified: Secondary | ICD-10-CM

## 2021-05-13 DIAGNOSIS — B182 Chronic viral hepatitis C: Secondary | ICD-10-CM | POA: Diagnosis not present

## 2021-05-13 DIAGNOSIS — Z5181 Encounter for therapeutic drug level monitoring: Secondary | ICD-10-CM | POA: Diagnosis not present

## 2021-05-13 DIAGNOSIS — E785 Hyperlipidemia, unspecified: Secondary | ICD-10-CM

## 2021-05-13 DIAGNOSIS — E1169 Type 2 diabetes mellitus with other specified complication: Secondary | ICD-10-CM

## 2021-05-13 MED ORDER — ROSUVASTATIN CALCIUM 10 MG PO TABS: ORAL_TABLET | ORAL | 1 refills | Status: DC

## 2021-05-13 NOTE — Progress Notes (Signed)
[facility]                                                             [address], [place], [place], [address]                                                                  Phn. [phone]; Fax: [phone]                                                                             Date: 05/12/21  Reason for follow up: Chronic Hepatitis C   Assessment Problem List Items Addressed This Visit       Digestive   Chronic hepatitis C without hepatic coma (HCC) - Primary   Relevant Orders   Hepatitis A antibody, total   Hepatic function panel   Other Visit Diagnoses     TB (pulmonary tuberculosis)       Relevant Orders    MYCOBACTERIA, CULTURE, WITH FLUOROCHROME SMEAR   Need for hepatitis B vaccination       Relevant Orders   Heplisav-B (HepB-CPG) Vaccine (Completed)   Medication monitoring encounter       Cancer screening           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  Sputum AFB smear and cultures option but we can ordered other or other Fu in 1 month  Fu with Pulmonary as instructed    All questions and concerns were discussed and addressed. Patient verbalized understanding of the plan. ____________________________________________________________________________________________________________________  HPI: 60 year old female with PMH of DM, HTN, HLD, TB on tx with [facility] who is referred for evaluation and management of  Hepatitis C. She was diagnosed with Hepatitis C in 2008 but she did not find it out until 2014 when her girlfriend saw it on on of her medical records. 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 Her ex-husband also had Hepatitis C ( unsure if it was treated). Denies h/o blood transfusion, has tattoos in her body. Denies any personal or family h/o liver diseases or Liver cancer. Denies tx to date. She is interested for Hepatitis C treatment.   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 She was on RIPE tx since feb 2021. She says she was on home isolation until October 2021 and was released from isolation precautions at that time. She then moved to [facility] in October 2021 and continued to follow with [facility] for her TB care. She has been currently taking Isoniazid/Pyridoxine and Rifampin. 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 She has been tolerating her ATT well without any issues.   She complains of diarrhea for few years. She says she has loose stool at least three times a week. Denies any hematochezia, pain or burning. Denies nausea/vomiting. Abdominal pain is occasiona. Denies cough, chest pain and SOB. Denies GU symptoms. Denies rashes or joint complaints.  Denies recent hospital admission for liver diseases.  She is seeing an Orthopedics for tear in her rt arm and also has been referred to see a pulomonologist.   Social - says she is adopted, Quit smoking 8 years ago, alcohol once in a while. 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 Has not been sexually active for 8 years. She lives with her brother who is deaf and takes care of her. She has 1 adult son and 3 adult daughters.  05/13/21 Here for follow-up for hepatitis C. denies any complaints.  She has been  off pulmonary TB treatment since May 03, 2021 and was told that she has completed treatment for pulmonary TB.  She does not have a follow-up with [facility] department anymore.  She was seen by pulmonary for evaluation of her chronic cough Her recent chest x-ray Showed persistent areas of nodularity and cavitation in the left lung with air-fluid level suggesting superinfection of the pre-existing cavity.  It was recommended to repeat sputum AFB culture off treatment and possible need of FOB for localized bronchiectasis with likelihood of reinfection. she denies any fevers, chills or night sweats.  Denies any chest pain or shortness of breath.  Appetite is good and has gained 4 pounds in the last few months.   Discussed with her regarding her past hepatitis C labs and ultrasound abdomen findings.  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  She is not immune to hepatitis B and will vaccinate her against hepatitis B. I will also check for serology for hepatitis A. she drinks wine occasionally.  Denies smoking and using other illicit drugs.  ROS: 12 point ROS done with pertinent positives and negative as listed above   Past Medical History:  Diagnosis Date   Diabetes mellitus without complication (HCC)    Hepatitis C    High cholesterol    Hypertension    Tuberculosis    non contagious Dx 05/2020    Past Surgical History:  Procedure Laterality Date   CHOLECYSTECTOMY  1998   TUBAL LIGATION  01/1995   Current Outpatient Medications on File Prior to Visit  Medication Sig Dispense Refill   aspirin-acetaminophen-caffeine (EXCEDRIN MIGRAINE) 250-250-65 MG tablet Take 1 tablet by mouth every 6 (six) hours as needed for headache.     atorvastatin (LIPITOR) 20 MG tablet Take 0.5 tablets by mouth daily.     calcium carbonate (TUMS - DOSED IN MG ELEMENTAL CALCIUM) 500 MG chewable tablet Chew 1 tablet by mouth daily.      calcium-vitamin D (OSCAL  WITH D) 500-200 MG-UNIT tablet Take 1 tablet by mouth.     glimepiride (AMARYL) 4 MG tablet Take 1 tablet (4 mg total) by mouth 2 (two) times daily. 180 tablet 1   pantoprazole (PROTONIX) 40 MG tablet Take 1 tablet by mouth daily.     SEGLUROMET 7.03-999 MG TABS Take 1 tablet by mouth 2 (two) times daily.     simethicone (GAS-X) 80 MG chewable tablet Chew 80 mg by mouth every 6 (six) hours as needed for flatulence.     sodium polystyrene (KAYEXALATE) 15 GM/60ML suspension Take 15 g by mouth as needed.     valsartan (DIOVAN) 160 MG tablet Take 1 tablet (160 mg total) by mouth daily. 30 tablet 11   No current facility-administered medications on file prior to visit.   No Known Allergies  Social History   Socioeconomic History   Marital status: Widowed    Spouse name: Not on file   Number of children: 4   Years of education: Not on file   Highest education level: 12th grade  Occupational History   Not on file  Tobacco Use   Smoking status: Former    Packs/day: 1.00    Years: 40.00    Pack years: 40.00    Types: Cigarettes    Quit date: 2014    Years since quitting: 8.4   Smokeless tobacco: Never  Vaping Use   Vaping Use: Unknown  Substance and Sexual Activity   Alcohol use: Yes   Drug use: Not Currently   Sexual activity: Not Currently  Other Topics Concern   Not on file  Social History Narrative   Not on file   Social Determinants of Health   Financial Resource Strain: Not on file  Food Insecurity: Not on file  Transportation Needs: Not on file  Physical Activity: Not on file  Stress: Not on file  Social Connections: Not on file  Intimate Partner Violence: Not on file    Vitals BP 129/81   Pulse 81   Temp 98.5 F (36.9 C) (Oral)   Ht  (1.803 m)   Wt 173 lb (78.5 kg)   SpO2 94%   BMI 24.13 kg/m '   Examination  General - not in acute distress, comfortably sitting in chair HEENT - PEERLA, no pallor and no icterus Chest -  b/l clear air entry, no additional sounds CVS- Normal s1s2, RRR Abdomen - Soft, Non tender , non distended Ext- no pedal edema Neuro: grossly normal Back - WNL Psych : calm and cooperative   Pertinent Imaging All pertinent labs/Imagings/notes reviewed. All pertinent plain films and CT images have been personally visualized and interpreted; radiology reports have been reviewed. Decision making incorporated into the Impression / Recommendations.  Chest Xray 04/21/21  FINDINGS: Previously noted cavitary lesion in the apex of the left upper lobe now has a layering air-fluid level. Again noted is a large nodular density in the superior segment of the left lower lobe, similar to the prior study. Right lung is clear. No pleural effusions. No pneumothorax. No evidence of pulmonary edema. Heart size is normal.   IMPRESSION: 1. Persistent areas of nodularity and cavitation in the left lung, compatible with reported tuberculosis infection. Air-fluid level could suggest superinfection of the pre-existing cavity.   US abdomen w elastography 04/14/21 FINDINGS: ULTRASOUND ABDOMEN   Gallbladder: Surgically absent.   Common bile duct: Diameter: 2.2 mm.   Liver: Coarsened hepatic echotexture without visible lesion on submitted images. Mildly nodular contour best  seen along the hepatic renal fossa on image 73 of 131. portal vein is patent on color Doppler imaging with normal direction of blood flow towards the liver.   IVC: Not well seen. Normal appearance on grayscale with respect to visualized portions.   Pancreas: Visualized portion unremarkable.   Spleen: Size and appearance within normal limits.   Right Kidney: Length: 11.2 cm. Mild increased echogenicity. Cystic area in the interpolar RIGHT kidney showing enhanced through transmission with well-circumscribed margins 1.5 x 1.1 x 1.4 cm   Left Kidney: Length: 10.2 cm. 1.3 x 0.9 x 1.5 cm cystic area in the lateral interpolar LEFT  kidney showing enhanced through transmission. Another similar area in the lateral interpolar LEFT kidney measuring 1.3 x 1.1 x 1.5 cm.   Abdominal aorta: No aneurysm visualized.   Other findings: No ascites.   ULTRASOUND HEPATIC ELASTOGRAPHY   Device: Siemens Helix VTQ   Patient position: Supine   Transducer 5 C1   Number of measurements: 10   Hepatic segment:  VIII   Median kPa: 6.0   IQR: 1.7   IQR/Median kPa ratio: 0.3   Data quality: IQR/Median kPa ratio of 0.3 or greater indicates reduced accuracy   Diagnostic category: < or = 9 kPa: in the absence of other known clinical signs, rules out cACLD   The use of hepatic elastography is applicable to patients with viral hepatitis and non-alcoholic fatty liver disease. At this time, there is insufficient data for the referenced cut-off values and use in other causes of liver disease, including alcoholic liver disease. Patients, however, may be assessed by elastography and serve as their own reference standard/baseline.   In patients with non-alcoholic liver disease, the values suggesting compensated advanced chronic liver disease (cACLD) may be lower, and patients may need additional testing with elasticity results of 7-9 kPa.   Please note that abnormal hepatic elasticity and shear wave velocities may also be identified in clinical settings other than with hepatic fibrosis, such as: acute hepatitis, elevated right heart and central venous pressures including use of beta blockers, veno-occlusive disease (Budd-Chiari), infiltrative processes such as mastocytosis/amyloidosis/infiltrative tumor/lymphoma, extrahepatic cholestasis, with hyperemia in the post-prandial state, and with liver transplantation. Correlation with patient history, laboratory data, and clinical condition recommended.   Diagnostic Categories:   < or =5 kPa: high probability of being normal   < or =9 kPa: in the absence of other known clinical  signs, rules out cACLD   >9 kPa and ?13 kPa: suggestive of cACLD, but needs further testing   >13 kPa: highly suggestive of cACLD   > or =17 kPa: highly suggestive of cACLD with an increased probability of clinically significant portal hypertension   IMPRESSION: ULTRASOUND ABDOMEN:   Coarsened hepatic echotexture raising the question of liver disease.   Post cholecystectomy.   No acute findings in the abdomen with renal cysts.   ULTRASOUND HEPATIC ELASTOGRAPHY:   Median kPa:  6.0   Diagnostic category: < or = 9 kPa: in the absence of other known clinical signs, rules out cACLD IQR to median kilopascals ratio at 0.3 which may indicate reduced accuracy of current data. Sonographic window is challenging based on assessment of submitted images and more than 1 site was assessed. Best data was reported.     I have spent 60 minutes for this patient encounter including  review of prior medical records with greater than 50% of time in face to face counsel of the patient/discussing diagnostics and plan of care.   Electronically signed  by:  Odette FractionSabina Evangelina Delancey, MD Infectious Disease Physician [facility]  [facility] [address] [place], [place] [address] Phone: [phone]  Fax: [phone]

## 2021-05-13 NOTE — Addendum Note (Signed)
Addended by: Jennette Kettle on: 05/13/2021 11:05 AM   Modules accepted: Orders

## 2021-05-13 NOTE — Telephone Encounter (Signed)
[facility] Patient Advocate Encounter  Prior Authorization for Mavyret has been approved.    PA# [id number] Effective dates: 05/13/21 through 07/08/21  Prescription will have to be filled at [facility])  Once script is sent I will call and find out if patient will have a copay.     [facility] will continue to follow.  Clearance Coots, CPhT Specialty Pharmacy Patient [facility] for Infectious Disease Phone: [phone] Fax:  [phone]

## 2021-05-13 NOTE — Telephone Encounter (Signed)
Patient presented to clinic today to discuss receiving Mavyret x 8 weeks for chronic Hepatitis C infection as prescribed by Dr. Elinor Parkinson. Lupita Leash is currently working on insurance/assistance.  Counseled patient to take all three tablets of Mavyret daily with food.  Counseled patient the need to take all three tablets together and to not separate them out during the day. Encouraged patient not to miss any doses and explained how their chance of cure could go down with each dose missed. Counseled patient on what to do if dose is missed - if it is closer to the missed dose take immediately; if closer to next dose then skip dose and take the next dose at the usual time. Counseled patient on common side effects such as headache, fatigue, and nausea and that these normally decrease with time.   I reviewed patient medications and found one drug interaction with Mavyret and atorvastatin. Discussed with Dr. Elinor Parkinson as atorvastatin is contraindicated with Mavyret. While patient is on Mavyret, will switch to rosuvastatin 10mg  once daily. Counseled patient to continue atorvastatin until she starts Mavyret.   Discussed with patient that there are several drug interactions with Mavyret and instructed patient to call the clinic if she wishes to start a new medication during course of therapy. Also advised patient to call if she experiences any side effects.

## 2021-05-13 NOTE — Telephone Encounter (Signed)
[facility] Patient Advocate Encounter   Received notification from [employer] that prior authorization for Mavyret is required.   PA submitted on 05/13/21 Key [id number] Status is pending    [facility] will continue to follow.   Clearance Coots, CPhT Specialty Pharmacy Patient [facility] for Infectious Disease Phone: [phone] Fax:  [phone]

## 2021-05-13 NOTE — Telephone Encounter (Signed)
Spoke with Danielle Hurley at [facility] and requested that records regarding TB treatment be faxed to our office.   Sandie Ano, RN

## 2021-05-13 NOTE — Addendum Note (Signed)
Addended by: Linna Hoff D on: 05/13/2021 09:18 AM   Modules accepted: Orders

## 2021-05-14 LAB — HEPATITIS A ANTIBODY, TOTAL: Hepatitis A AB,Total: REACTIVE — AB

## 2021-05-14 LAB — HEPATIC FUNCTION PANEL
AG Ratio: 1.3 (calc) (ref 1.0–2.5)
ALT: 33 U/L — ABNORMAL HIGH (ref 6–29)
AST: 32 U/L (ref 10–35)
Albumin: 4.2 g/dL (ref 3.6–5.1)
Alkaline phosphatase (APISO): 49 U/L (ref 37–153)
Bilirubin, Direct: 0.1 mg/dL (ref 0.0–0.2)
Globulin: 3.3 g/dL (calc) (ref 1.9–3.7)
Indirect Bilirubin: 0.3 mg/dL (calc) (ref 0.2–1.2)
Total Bilirubin: 0.4 mg/dL (ref 0.2–1.2)
Total Protein: 7.5 g/dL (ref 6.1–8.1)

## 2021-05-15 ENCOUNTER — Other Ambulatory Visit: Payer: Self-pay | Admitting: Pharmacist

## 2021-05-15 DIAGNOSIS — B182 Chronic viral hepatitis C: Secondary | ICD-10-CM

## 2021-05-15 MED ORDER — MAVYRET 100-40 MG PO TABS: 3.0000 | ORAL_TABLET | Freq: Every day | ORAL | 1 refills | Status: DC

## 2021-05-15 NOTE — Progress Notes (Signed)
Sending Mavyret to [employer] as required by [employer].

## 2021-05-26 ENCOUNTER — Other Ambulatory Visit: Payer: Self-pay

## 2021-05-26 ENCOUNTER — Other Ambulatory Visit: Payer: 59

## 2021-05-26 DIAGNOSIS — A150 Tuberculosis of lung: Secondary | ICD-10-CM

## 2021-06-13 ENCOUNTER — Other Ambulatory Visit: Payer: Self-pay

## 2021-06-13 ENCOUNTER — Encounter: Payer: Self-pay | Admitting: Infectious Diseases

## 2021-06-13 ENCOUNTER — Ambulatory Visit (INDEPENDENT_AMBULATORY_CARE_PROVIDER_SITE_OTHER): Payer: 59 | Admitting: Infectious Diseases

## 2021-06-13 VITALS — BP 132/80 | HR 78 | Temp 98.0°F | Wt 183.0 lb

## 2021-06-13 DIAGNOSIS — A150 Tuberculosis of lung: Secondary | ICD-10-CM

## 2021-06-13 DIAGNOSIS — Z5181 Encounter for therapeutic drug level monitoring: Secondary | ICD-10-CM | POA: Insufficient documentation

## 2021-06-13 DIAGNOSIS — B182 Chronic viral hepatitis C: Secondary | ICD-10-CM

## 2021-06-13 NOTE — Progress Notes (Signed)
[facility]                                                             [address], [place], [place], [id number]                                                                  Phn. [phone]; Fax: [phone]                                                                             Date: 06/11/21  Reason for follow up: Chronic Hepatitis C   Assessment Problem List Items Addressed This Visit       Respiratory   TB (pulmonary tuberculosis) - Primary   Relevant Orders    MYCOBACTERIA, CULTURE, WITH FLUOROCHROME SMEAR     Digestive   Chronic hepatitis C without hepatic coma (HCC)     Other   Medication monitoring encounter    Chronic Hepatitis C Pulmonary Tuberculosis -completed treatment on May 03, 2021 Immunization counseling : Immune to Hepatitis A, s/p 1 dose of hep B Medication Monitoring - DDI with Atotvastatin and will change atorvastatin to Rosuvastatin 10mg  PO daily while on mavyret  HCC screening - will need US abdomen every 6 months   Plan Complete remainder of 8 weeks course of Mavyret Will plan for Hep B #2 in next visit  Have not received [facility] records regarding details of her Pul TB tx. Will request again I will do another AFB sputum smear and cx today  Will also plan for follow chest xray possibly in next visit Fu with Pulmonary as instructed  Fu in 4-6 weeks around end of treatment   All questions and concerns were discussed and addressed. Patient verbalized understanding of the plan. ____________________________________________________________________________________________________________________  HPI: 60 year old female with PMH of DM, HTN, HLD, TB on tx with [facility] who is referred for evaluation and management of Hepatitis C. She was diagnosed with Hepatitis C in 2008 but she did not find it out until 2014 when her girlfriend saw it on on of her  medical records. 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 Her ex-husband also had Hepatitis C ( unsure if it was treated). Denies h/o blood transfusion, has tattoos in her body. Denies any personal or family h/o liver diseases or Liver cancer. Denies tx to date. She is interested for Hepatitis C treatment.   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 She was on RIPE  tx since feb 2021. She says she was on home isolation until October 2021 and was released from isolation precautions at that time. She then moved to [facility] in October 2021 and continued to follow with [facility] for her TB care. She has been currently taking Isoniazid/Pyridoxine and Rifampin. 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 She has been tolerating her ATT well without any issues.   She complains of diarrhea for few years. She says she has loose stool at least three times a week. Denies any hematochezia, pain or burning. Denies nausea/vomiting. Abdominal pain is occasiona. Denies cough, chest pain and SOB. Denies GU symptoms. Denies rashes or joint complaints.  Denies recent hospital admission for liver diseases.  She is seeing an Orthopedics for tear in her rt arm and also has been referred to see a pulomonologist.   Social - says she is adopted, Quit smoking 8 years ago, alcohol once in a while. 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 Has not been sexually active for 8 years. She lives with her brother who is deaf and takes care of her. She has 1 adult son and 3 adult daughters.  05/13/21 Here for follow-up for hepatitis C. denies any complaints.  She has been off pulmonary TB treatment since May 03, 2021 and was told that she has completed treatment for pulmonary TB.  She does not have a  follow-up with [facility] department anymore.  She was seen by pulmonary for evaluation of her chronic cough Her recent chest x-ray Showed persistent areas of nodularity and cavitation in the left lung with air-fluid level suggesting superinfection of the pre-existing cavity.  It was recommended to repeat sputum AFB culture off treatment and possible need of FOB for localized bronchiectasis with likelihood of reinfection. she denies any fevers, chills or night sweats.  Denies any chest pain or shortness of breath.  Appetite is good and has gained 4 pounds in the last few months.   Discussed with her regarding her past hepatitis C labs and ultrasound abdomen findings.  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  She is not immune to hepatitis B and will vaccinate her against hepatitis B. I will also check for serology for hepatitis A. she drinks wine occasionally.  Denies smoking and using other illicit drugs.  06/13/21 Taking Mavyret 3 tablets a day without missing any doses. She is on her 4th week . Denies any side effects with the mavyret. She has cough and it is productive of clear sputum. She has been using Mucinex DM which has been helping with the cough. Denies any chest pain and SOB. Appetite is good and thinks she has gained weight. Her last sputum AFB cx from 6/27 ha sno growth so far. She is also closely following with Pulmonary. Discussed with her to collect one more sputum AFB cx and follow up in 4-6 weeks at the end of her hep C tx.   ROS: 12 point ROS done with pertinent positives and negative as listed above   Past Medical History:  Diagnosis Date   Diabetes mellitus without complication (HCC)    Hepatitis C    High cholesterol    Hypertension    Tuberculosis    non contagious Dx 05/2020    Past Surgical History:  Procedure Laterality Date   CHOLECYSTECTOMY  1998   TUBAL LIGATION  01/1995   Current  Outpatient Medications  on File Prior to Visit  Medication Sig Dispense Refill   aspirin-acetaminophen-caffeine (EXCEDRIN MIGRAINE) 250-250-65 MG tablet Take 1 tablet by mouth every 6 (six) hours as needed for headache.     calcium carbonate (TUMS - DOSED IN MG ELEMENTAL CALCIUM) 500 MG chewable tablet Chew 1 tablet by mouth daily.     calcium-vitamin D (OSCAL WITH D) 500-200 MG-UNIT tablet Take 1 tablet by mouth.     Glecaprevir-Pibrentasvir (MAVYRET) 100-40 MG TABS Take 3 tablets by mouth daily with breakfast. 84 tablet 1   glimepiride (AMARYL) 4 MG tablet Take 1 tablet (4 mg total) by mouth 2 (two) times daily. 180 tablet 1   pantoprazole (PROTONIX) 40 MG tablet Take 1 tablet by mouth daily.     rosuvastatin (CRESTOR) 10 MG tablet Take 1 tablet once daily while on Mavyret. (Continue taking atorvastatin until you start Mavyret) 30 tablet 1   No current facility-administered medications on file prior to visit.     No Known Allergies  Social History   Socioeconomic History   Marital status: Widowed    Spouse name: Not on file   Number of children: 4   Years of education: Not on file   Highest education level: 12th grade  Occupational History   Not on file  Tobacco Use   Smoking status: Former    Packs/day: 1.00    Years: 40.00    Pack years: 40.00    Types: Cigarettes    Quit date: 2014    Years since quitting: 8.5   Smokeless tobacco: Never  Vaping Use   Vaping Use: Unknown  Substance and Sexual Activity   Alcohol use: Yes    Comment: occasional   Drug use: Not Currently   Sexual activity: Not Currently  Other Topics Concern   Not on file  Social History Narrative   Not on file   Social Determinants of Health   Financial Resource Strain: Not on file  Food Insecurity: Not on file  Transportation Needs: Not on file  Physical Activity: Not on file  Stress: Not on file  Social Connections: Not on file  Intimate Partner Violence: Not on file    Vitals BP 132/80    Pulse 78   Temp 98 F (36.7 C) (Oral)   Wt 183 lb (83 kg)   SpO2 97%   BMI 25.52 kg/m     Examination  General - not in acute distress, comfortably sitting in chair HEENT - PEERLA, no pallor and no icterus Chest - b/l clear air entry, no additional sounds CVS- Normal s1s2, RRR Abdomen - Soft, Non tender , non distended Ext- no pedal edema Neuro: grossly normal Back - WNL Psych : calm and cooperative   Pertinent Imaging All pertinent labs/Imagings/notes reviewed. All pertinent plain films and CT images have been personally visualized and interpreted; radiology reports have been reviewed. Decision making incorporated into the Impression / Recommendations.  Chest Xray 04/21/21  FINDINGS: Previously noted cavitary lesion in the apex of the left upper lobe now has a layering air-fluid level. Again noted is a large nodular density in the superior segment of the left lower lobe, similar to the prior study. Right lung is clear. No pleural effusions. No pneumothorax. No evidence of pulmonary edema. Heart size is normal.   IMPRESSION: 1. Persistent areas of nodularity and cavitation in the left lung, compatible with reported tuberculosis infection. Air-fluid level could suggest superinfection of the pre-existing cavity.   US abdomen w elastography 04/14/21 FINDINGS: ULTRASOUND ABDOMEN  Gallbladder: Surgically absent.   Common bile duct: Diameter: 2.2 mm.   Liver: Coarsened hepatic echotexture without visible lesion on submitted images. Mildly nodular contour best seen along the hepatic renal fossa on image 73 of 131. portal vein is patent on color Doppler imaging with normal direction of blood flow towards the liver.   IVC: Not well seen. Normal appearance on grayscale with respect to visualized portions.   Pancreas: Visualized portion unremarkable.   Spleen: Size and appearance within normal limits.   Right Kidney: Length: 11.2 cm. Mild increased echogenicity.  Cystic area in the interpolar RIGHT kidney showing enhanced through transmission with well-circumscribed margins 1.5 x 1.1 x 1.4 cm   Left Kidney: Length: 10.2 cm. 1.3 x 0.9 x 1.5 cm cystic area in the lateral interpolar LEFT kidney showing enhanced through transmission. Another similar area in the lateral interpolar LEFT kidney measuring 1.3 x 1.1 x 1.5 cm.   Abdominal aorta: No aneurysm visualized.   Other findings: No ascites.   ULTRASOUND HEPATIC ELASTOGRAPHY   Device: Siemens Helix VTQ   Patient position: Supine   Transducer 5 C1   Number of measurements: 10   Hepatic segment:  VIII   Median kPa: 6.0   IQR: 1.7   IQR/Median kPa ratio: 0.3   Data quality: IQR/Median kPa ratio of 0.3 or greater indicates reduced accuracy   Diagnostic category: < or = 9 kPa: in the absence of other known clinical signs, rules out cACLD   The use of hepatic elastography is applicable to patients with viral hepatitis and non-alcoholic fatty liver disease. At this time, there is insufficient data for the referenced cut-off values and use in other causes of liver disease, including alcoholic liver disease. Patients, however, may be assessed by elastography and serve as their own reference standard/baseline.   In patients with non-alcoholic liver disease, the values suggesting compensated advanced chronic liver disease (cACLD) may be lower, and patients may need additional testing with elasticity results of 7-9 kPa.   Please note that abnormal hepatic elasticity and shear wave velocities may also be identified in clinical settings other than with hepatic fibrosis, such as: acute hepatitis, elevated right heart and central venous pressures including use of beta blockers, veno-occlusive disease (Budd-Chiari), infiltrative processes such as mastocytosis/amyloidosis/infiltrative tumor/lymphoma, extrahepatic cholestasis, with hyperemia in the post-prandial state, and with liver  transplantation. Correlation with patient history, laboratory data, and clinical condition recommended.   Diagnostic Categories:   < or =5 kPa: high probability of being normal   < or =9 kPa: in the absence of other known clinical signs, rules out cACLD   >9 kPa and ?13 kPa: suggestive of cACLD, but needs further testing   >13 kPa: highly suggestive of cACLD   > or =17 kPa: highly suggestive of cACLD with an increased probability of clinically significant portal hypertension   IMPRESSION: ULTRASOUND ABDOMEN:   Coarsened hepatic echotexture raising the question of liver disease.   Post cholecystectomy.   No acute findings in the abdomen with renal cysts.   ULTRASOUND HEPATIC ELASTOGRAPHY:   Median kPa:  6.0   Diagnostic category: < or = 9 kPa: in the absence of other known clinical signs, rules out cACLD IQR to median kilopascals ratio at 0.3 which may indicate reduced accuracy of current data. Sonographic window is challenging based on assessment of submitted images and more than 1 site was assessed. Best data was reported.   I have spent 35  minutes for this patient encounter including  review of  prior medical records with greater than 50% of time in face to face counsel of the patient/discussing diagnostics and plan of care.   Electronically signed by:  Odette Fraction, MD Infectious Disease Physician [facility] [address] [place], [place] [address] Phone: [phone]  Fax: [phone]

## 2021-06-24 ENCOUNTER — Other Ambulatory Visit: Payer: Self-pay

## 2021-06-24 ENCOUNTER — Encounter: Payer: Self-pay | Admitting: Internal Medicine

## 2021-06-24 ENCOUNTER — Ambulatory Visit (INDEPENDENT_AMBULATORY_CARE_PROVIDER_SITE_OTHER): Payer: 59

## 2021-06-24 ENCOUNTER — Ambulatory Visit (INDEPENDENT_AMBULATORY_CARE_PROVIDER_SITE_OTHER): Payer: 59 | Admitting: Internal Medicine

## 2021-06-24 VITALS — BP 132/80 | Temp 98.0°F | Ht 71.0 in | Wt 183.0 lb

## 2021-06-24 DIAGNOSIS — I152 Hypertension secondary to endocrine disorders: Secondary | ICD-10-CM | POA: Diagnosis not present

## 2021-06-24 DIAGNOSIS — R058 Other specified cough: Secondary | ICD-10-CM

## 2021-06-24 DIAGNOSIS — A150 Tuberculosis of lung: Secondary | ICD-10-CM

## 2021-06-24 DIAGNOSIS — E1159 Type 2 diabetes mellitus with other circulatory complications: Secondary | ICD-10-CM

## 2021-06-24 MED ORDER — AMOXICILLIN-POT CLAVULANATE 875-125 MG PO TABS: ORAL_TABLET | ORAL | 0 refills | Status: DC

## 2021-06-24 MED ORDER — FAMOTIDINE 20 MG PO TABS: ORAL_TABLET | ORAL | 11 refills | Status: DC

## 2021-06-24 NOTE — Progress Notes (Signed)
Danielle Hurley, female    DOB: 1961/11/29,   MRN: [id number]   Brief patient profile:  72 yowf from [place] quit smoking in 2014 due to cough which resolved despite Lisinopril use but then started coughing again Nov 2020 > w/u in [place] with initial dx of coxy then tb by FNA and treated there completed end of May 22 with rif / inh with neg results for TB oc 2021 but persisently abn cxr and clear mucus production referred to pulmonary clinic   05/08/21 by Vincent Gros NP      History of Present Illness  05/08/2021  Pulmonary/ 1st office eval/Danielle Hurley  Chief Complaint  Patient presents with   Pulmonary Consult    Referred by Vincent Gros, NP. Pt states dx with TB Feb 2021- just finished isonazid, B6 and rifampin therapy that she was on for a year. She states had a cough off and on but this has been worse x 2 months- prod with clear sputum.    Dyspnea: MMRC1 = can walk nl pace, flat grade, can't hurry or go uphills or steps s sob   Cough: at hs clear mucus / doesn't keep her up but the "wheeze" does / no change since on TB rx  Sleep: bed flat / one pillow  SABA use: not using/ did help much  Rec Stop lisinopril and start valsartan 160 mg one daily  I would continue isolation as you are  I am deferring the follow up sputum studies to your ID doctor  Please schedule a follow up office visit in 6 weeks, call sooner if needed       06/24/2021  f/u ov/Danielle Hurley re: cavitary dz s/p tb rx,  uacs ? ACEi related?  No chief complaint on file. Dyspnea:  MMRC1 = can walk nl pace, flat grade, can't hurry or go uphills or steps s sob   Cough: worst at hs p 10 min >  now described as slt discolored, thick also same in am  Sleeping: R side down on back/  SABA use: has not nusing  02: none  Covid status:   never vaccinated  No fever/ no sinus symptoms Wt trending up    No obvious day to day or daytime variability or assoc excess/ purulent sputum or mucus plugs or hemoptysis or cp or chest  tightness, subjective wheeze or overt sinus or hb symptoms.   Sleeps ok as above  without nocturnal  or early am exacerbation  of respiratory  c/o's or need for noct saba. Also denies any obvious fluctuation of symptoms with weather or environmental changes or other aggravating or alleviating factors except as outlined above   No unusual exposure hx or h/o childhood pna/ asthma or knowledge of premature birth.  Current Allergies, Complete Past Medical History, Past Surgical History, Family History, and Social History were reviewed in [employer] record.  ROS  The following are not active complaints unless bolded Hoarseness, sore throat, dysphagia, dental problems, itching, sneezing,  nasal congestion or discharge of excess mucus or purulent secretions, ear ache,   fever, chills, sweats, unintended wt loss or wt gain, classically pleuritic or exertional cp,  orthopnea pnd or arm/hand swelling  or leg swelling, presyncope, palpitations, abdominal pain, anorexia, nausea, vomiting, diarrhea  or change in bowel habits or change in bladder habits, change in stools or change in urine, dysuria, hematuria,  rash, arthralgias, visual complaints, headache, numbness, weakness or ataxia or problems with walking or coordination,  change in mood  or  memory.        Current Meds  Medication Sig   amoxicillin-clavulanate (AUGMENTIN) 875-125 MG tablet One twice daily with a glass of water   aspirin-acetaminophen-caffeine (EXCEDRIN MIGRAINE) 250-250-65 MG tablet Take 1 tablet by mouth every 6 (six) hours as needed for headache.   calcium carbonate (TUMS - DOSED IN MG ELEMENTAL CALCIUM) 500 MG chewable tablet Chew 1 tablet by mouth daily.   calcium-vitamin D (OSCAL WITH D) 500-200 MG-UNIT tablet Take 1 tablet by mouth.   dextromethorphan-guaiFENesin (MUCINEX DM) 30-600 MG 12hr tablet Take 1 tablet by mouth 2 (two) times daily.   famotidine (PEPCID) 20 MG tablet One after supper    Glecaprevir-Pibrentasvir (MAVYRET) 100-40 MG TABS Take 3 tablets by mouth daily with breakfast.   glimepiride (AMARYL) 4 MG tablet Take 1 tablet (4 mg total) by mouth 2 (two) times daily.   pantoprazole (PROTONIX) 40 MG tablet Take 1 tablet by mouth daily.   rosuvastatin (CRESTOR) 10 MG tablet Take 1 tablet once daily while on Mavyret. (Continue taking atorvastatin until you start Mavyret)          Past Medical History:  Diagnosis Date   Diabetes mellitus without complication (HCC)    Hepatitis C    High cholesterol    Hypertension    Tuberculosis    non contagious Dx 05/2020       Objective:        Wt Readings from Last 3 Encounters:  06/24/21 183 lb (83 kg)  06/13/21 183 lb (83 kg)  05/13/21 173 lb (78.5 kg)      Vital signs reviewed  06/24/2021  - Note at rest 02 sats  96% on RA   General appearance:    amb wf nad      HEENT : pt wearing mask not removed for exam due to covid -19 concerns.    NECK :  without JVD/Nodes/TM/ nl carotid upstrokes bilaterally   LUNGS: no acc muscle use,  Nl contour chest which is clear to A and P bilaterally without cough on insp or exp maneuvers   CV:  RRR  no s3 or murmur or increase in P2, and no edema   ABD:  soft and nontender with nl inspiratory excursion in the supine position. No bruits or organomegaly appreciated, bowel sounds nl  MS:  Nl gait/ ext warm without deformities, calf tenderness, cyanosis or clubbing No obvious joint restrictions   SKIN: warm and dry without lesions    NEURO:  alert, approp, nl sensorium with  no motor or cerebellar deficits apparent.    CXR PA and Lateral:   06/24/2021 :    I personally reviewed images /impression as follows:    Cavity a bit more full , otherwise no significant change            Assessment

## 2021-06-24 NOTE — Assessment & Plan Note (Addendum)
D/c acei 05/08/2021 > no change cough 06/24/2021   Although even in retrospect it may not be clear the ACEi contributed to the pt's symptoms,   adding them back at this point or in the future would risk confusion in interpretation of non-specific respiratory symptoms to which this patient is prone  ie  Better not to muddy the waters here.   >>  bp ok off ACEi,ok to  leave off

## 2021-06-24 NOTE — Assessment & Plan Note (Addendum)
rx March 2021 -May 2022 with INH/Rif - still egg sized cavity with a/f levels on cxr at end of RX Apr 25 2021  - AFB 06/13/21 > neg smear  - cxr 06/24/2021  =      Cavity a bit more full, otherwise no significant change - 06/24/2021  >>>   Augmentin x 14 days then recheck and consider excision vs bx   Same ddx as before, strongly suspect secondary infection rather than ca or recurrent / resistant tb exp since smear neg now  Discussed in detail all the  indications, usual  risks and alternatives  relative to the benefits with patient who agrees to proceed with Rx as outlined.             Each maintenance medication was reviewed in detail including emphasizing most importantly the difference between maintenance and prns and under what circumstances the prns are to be triggered using an action plan format where appropriate.  Total time for H and P, chart review, counseling,  and generating customized AVS unique to this office visit / same day charting  > 30 min

## 2021-06-24 NOTE — Assessment & Plan Note (Signed)
Onset 2020  > d/c acei 05/08/2021 > no change 06/24/2021   No better so not likely acei related but leave off acei until sort this all out   For now add max rx for gerd = continue ppi q am ac and add pepcid p supper 20 mg to see if any noct cough improves

## 2021-06-24 NOTE — Patient Instructions (Addendum)
Augmentin 875 mg take one pill twice daily  X 14 days - take at breakfast and supper with large glass of water.  It would help reduce the usual side effects (diarrhea and yeast infections) if you ate cultured yogurt at lunch.   Pepcid 20 mg an hour before bedtime and protonix Take 30-60 min before first meal of the day   GERD (REFLUX)  is an extremely common cause of respiratory symptoms just like yours , many times with no obvious heartburn at all.    It can be treated with medication, but also with lifestyle changes including  avoidance of late meals, excessive alcohol, and avoid fatty foods, chocolate, peppermint, colas, red wine, and acidic juices such as orange juice.  NO MINT OR MENTHOL PRODUCTS SO NO COUGH DROPS  USE SUGARLESS CANDY INSTEAD (Jolley ranchers or Stover's or Life Savers) or even ice chips will also do - the key is to swallow to prevent all throat clearing. NO OIL BASED VITAMINS - use powdered substitutes.  Avoid fish oil when coughing.    For cough > mucinex 1200 mg every 12 hours as needed   Please remember to go to the  x-ray department  for your tests - we will call you with the results when they are available    Return in 2 weeks for another cxr and I will call with recommendations

## 2021-06-25 ENCOUNTER — Other Ambulatory Visit: Payer: Self-pay | Admitting: Internal Medicine

## 2021-06-25 DIAGNOSIS — A150 Tuberculosis of lung: Secondary | ICD-10-CM

## 2021-06-25 NOTE — Progress Notes (Signed)
Spoke with pt and notified of results per Dr. Sherene Sires. Pt verbalized understanding and denied any questions. Order placed for repeat cxr

## 2021-07-08 ENCOUNTER — Encounter: Payer: Self-pay | Admitting: Nurse Practitioner

## 2021-07-08 ENCOUNTER — Other Ambulatory Visit: Payer: Self-pay

## 2021-07-08 ENCOUNTER — Ambulatory Visit (INDEPENDENT_AMBULATORY_CARE_PROVIDER_SITE_OTHER): Payer: 59 | Admitting: Nurse Practitioner

## 2021-07-08 VITALS — BP 122/74 | HR 76 | Temp 98.7°F | Ht 71.0 in | Wt 186.6 lb

## 2021-07-08 DIAGNOSIS — B373 Candidiasis of vulva and vagina: Secondary | ICD-10-CM | POA: Diagnosis not present

## 2021-07-08 DIAGNOSIS — E119 Type 2 diabetes mellitus without complications: Secondary | ICD-10-CM | POA: Diagnosis not present

## 2021-07-08 DIAGNOSIS — B182 Chronic viral hepatitis C: Secondary | ICD-10-CM | POA: Diagnosis not present

## 2021-07-08 DIAGNOSIS — H6011 Cellulitis of right external ear: Secondary | ICD-10-CM

## 2021-07-08 DIAGNOSIS — B3731 Acute candidiasis of vulva and vagina: Secondary | ICD-10-CM

## 2021-07-08 MED ORDER — FLUCONAZOLE 150 MG PO TABS: ORAL_TABLET | ORAL | 0 refills | Status: DC

## 2021-07-08 MED ORDER — MUPIROCIN 2 % EX OINT: TOPICAL_OINTMENT | CUTANEOUS | 1 refills | Status: DC

## 2021-07-08 NOTE — Progress Notes (Signed)
Established Patient Office Visit  Subjective:  Patient ID: Danielle ReddenJanet Hurley, female    DOB: 12/17/1960  Age: 60 y.o. MRN: [id number]  CC:  Chief Complaint  Patient presents with   Insect Bite     HPI Danielle ReddenJanet Hurley presents for evaluation of what she feels like is insect bite to her right..  Gradually getting larger.  Very tender.  Unable to lay on her right side or to put any pressure on the side of her head.  She states it does not hurt inside of her ear just around the earlobe.  Denies ear congestion or fullness.  Denies headache.  Denies fever.  She is currently on Augmentin per pulmonology for chest infection.  States she has been on Augmentin for nearly 2 weeks.  Her last dose is this coming Saturday.  States this antibiotic has not made a difference in the swelling of her right earlobe. She states she has some vaginal itching and irritation along with vaginal discharge.  States this started when Augmentin was started.  She does have a Diflucan tablet.  Currently being treated with Mavyret for hepatitis C.  Unsure if she is able to take Diflucan along with Mavyret. 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  States feels like they are stuck in mud, or that there is blood in between her toes.  Makes it difficult for her to move and bend her toes.  Denies pain in the feet and lower legs.  She is diabetic.  Do have check of hemoglobin A1c and routine follow-up.  Past Medical History:  Diagnosis Date   Diabetes mellitus without complication (HCC)    Hepatitis C    High cholesterol    Hypertension    Tuberculosis    non contagious Dx 05/2020    Past Surgical History:  Procedure Laterality Date   CHOLECYSTECTOMY  1998   TUBAL LIGATION  01/1995    History reviewed. No pertinent family history.  Social History   Socioeconomic History   Marital status: Widowed    Spouse name: Not on file   Number of children: 4   Years of education: Not on file   Highest education level: 12th  grade  Occupational History   Not on file  Tobacco Use   Smoking status: Former    Packs/day: 1.00    Years: 40.00    Pack years: 40.00    Types: Cigarettes    Quit date: 2014    Years since quitting: 8.6   Smokeless tobacco: Never  Vaping Use   Vaping Use: Unknown  Substance and Sexual Activity   Alcohol use: Yes    Comment: occasional   Drug use: Not Currently   Sexual activity: Not Currently  Other Topics Concern   Not on file  Social History Narrative   Not on file   Social Determinants of Health   Financial Resource Strain: Not on file  Food Insecurity: Not on file  Transportation Needs: Not on file  Physical Activity: Not on file  Stress: Not on file  Social Connections: Not on file  Intimate Partner Violence: Not on file    Outpatient Medications Prior to Visit  Medication Sig Dispense Refill   amoxicillin-clavulanate (AUGMENTIN) 875-125 MG tablet One twice daily with a glass of water 28 tablet 0   aspirin-acetaminophen-caffeine (EXCEDRIN MIGRAINE) 250-250-65 MG tablet Take 1 tablet by mouth every 6 (six) hours as needed for headache.     calcium carbonate (TUMS - DOSED IN MG ELEMENTAL CALCIUM)  500 MG chewable tablet Chew 1 tablet by mouth daily.     calcium-vitamin D (OSCAL WITH D) 500-200 MG-UNIT tablet Take 1 tablet by mouth.     dextromethorphan-guaiFENesin (MUCINEX DM) 30-600 MG 12hr tablet Take 1 tablet by mouth 2 (two) times daily.     famotidine (PEPCID) 20 MG tablet One after supper 30 tablet 11   Glecaprevir-Pibrentasvir (MAVYRET) 100-40 MG TABS Take 3 tablets by mouth daily with breakfast. 84 tablet 1   glimepiride (AMARYL) 4 MG tablet Take 1 tablet (4 mg total) by mouth 2 (two) times daily. 180 tablet 1   pantoprazole (PROTONIX) 40 MG tablet Take 1 tablet by mouth daily.     rosuvastatin (CRESTOR) 10 MG tablet Take 1 tablet once daily while on Mavyret. (Continue taking atorvastatin until you start Mavyret) 30 tablet 1   SEGLUROMET 7.03-999 MG TABS  Take 1 tablet by mouth 2 (two) times daily.     No facility-administered medications prior to visit.    No Known Allergies  ROS Review of Systems  Constitutional:  Negative for activity change, chills and fatigue.  HENT:  Positive for ear pain and hearing loss. Negative for congestion, postnasal drip, rhinorrhea, sinus pressure, sinus pain and sneezing.        Pain is mostly around the right earlobe.  Pain gets worse when pressure is applied to the earlobe.  Eyes: Negative.   Respiratory:  Positive for cough and wheezing.        Currently being treated with Augmentin per pulmonology for chest infection.  Cardiovascular:  Negative for chest pain and palpitations.  Gastrointestinal:  Negative for diarrhea, nausea and vomiting.  Genitourinary:  Positive for vaginal discharge.       Vaginal itching and irritation  Musculoskeletal:  Negative for back pain and myalgias.  Skin:  Negative for rash.       Feels like she has bite on the right earlobe.  Earlobe is swollen and tender.  Does not stretch into the ear canal.   Allergic/Immunologic: Negative.   Neurological:  Negative for dizziness, weakness and headaches.  Hematological: Negative.   Psychiatric/Behavioral: Negative.  The patient is not nervous/anxious.      Objective:    Physical Exam Vitals and nursing note reviewed.  Constitutional:      Appearance: Normal appearance. She is well-developed.  HENT:     Head: Normocephalic and atraumatic.     Right Ear: Tenderness present.     Left Ear: Ear canal and external ear normal.     Ears:      Nose: Nose normal.     Mouth/Throat:     Mouth: Mucous membranes are moist.  Eyes:     Extraocular Movements: Extraocular movements intact.     Conjunctiva/sclera: Conjunctivae normal.     Pupils: Pupils are equal, round, and reactive to light.  Cardiovascular:     Rate and Rhythm: Normal rate and regular rhythm.     Pulses: Normal pulses.     Heart sounds: Normal heart sounds.   Pulmonary:     Effort: Pulmonary effort is normal.     Breath sounds: Normal breath sounds.     Comments: Patient has congested, nonproductive cough while in the office today. Abdominal:     Palpations: Abdomen is soft.  Musculoskeletal:        General: Normal range of motion.     Cervical back: Normal range of motion and neck supple.  Lymphadenopathy:     Cervical: No cervical adenopathy.  Skin:    General: Skin is warm and dry.     Capillary Refill: Capillary refill takes less than 2 seconds.  Neurological:     General: No focal deficit present.     Mental Status: She is alert and oriented to person, place, and time.  Psychiatric:        Mood and Affect: Mood normal.        Behavior: Behavior normal.        Thought Content: Thought content normal.        Judgment: Judgment normal.    Today's Vitals   07/08/21 1100  BP: 122/74  Pulse: 76  Temp: 98.7 F (37.1 C)  SpO2: 93%  Weight: 186 lb 9.6 oz (84.6 kg)   Body mass index is 26.03 kg/m.  Wt Readings from Last 3 Encounters:  07/08/21 186 lb 9.6 oz (84.6 kg)  06/24/21 183 lb (83 kg)  06/13/21 183 lb (83 kg)     Health Maintenance Due  Topic Date Due   PNEUMOCOCCAL POLYSACCHARIDE VACCINE AGE 64-64 HIGH RISK  Never done   COVID-19 Vaccine (1) Never done   FOOT EXAM  Never done   OPHTHALMOLOGY EXAM  Never done   URINE MICROALBUMIN  Never done   TETANUS/TDAP  Never done   PAP SMEAR-Modifier  Never done   COLONOSCOPY (Pts 45-55yrs Insurance coverage will need to be confirmed)  Never done   MAMMOGRAM  Never done   Zoster Vaccines- Shingrix (1 of 2) Never done   INFLUENZA VACCINE  06/30/2021    There are no preventive care reminders to display for this patient.  No results found for: TSH Lab Results  Component Value Date   WBC 7.3 04/08/2021   HGB 13.2 04/08/2021   HCT 39.4 04/08/2021   MCV 90.0 04/08/2021   PLT 201 04/08/2021   Lab Results  Component Value Date   BILITOT 0.4 05/13/2021   AST 32  05/13/2021   ALT 33 (H) 05/13/2021   PROT 7.5 05/13/2021   No results found for: CHOL No results found for: HDL No results found for: LDLCALC No results found for: TRIG No results found for: CHOLHDL Lab Results  Component Value Date   HGBA1C 6.8 (A) 03/31/2021      Assessment & Plan:  1. Cellulitis of ear, right Patient currently on Augmentin for infection in lungs.  Continue to take until prescription gone.  Add Bactroban ointment.  Apply small amount to affected area twice daily for next 2 weeks and then as needed.  Advised patient to notify the office if swelling and pain of the earlobe becomes more severe.  May consider different antibiotic at that time. - mupirocin ointment (BACTROBAN) 2 %; Apply small amount to effected area twice daily for 2 weeks then as needed  Dispense: 22 g; Refill: 1  2. Vaginal yeast infection Patient may take Diflucan once for yeast infection.  May repeat the dose in 3 days if symptoms are persistent.  Cross-referenced Diflucan with Mavyret for hepatitis C and there are were no negative interactions reported. - fluconazole (DIFLUCAN) 150 MG tablet; Take 1 tablet po once. May repeat dose in 3 days as needed for persistent symptoms.  Dispense: 3 tablet; Refill: 0  3. Chronic hepatitis C without hepatic coma (HCC) Should should continue with Mavyret as prescribed.  4. Diabetes mellitus without complication (HCC) Will have patient return the office in about 6 weeks to check hemoglobin A1c and evaluate effectiveness of current medications.  Problem List  Items Addressed This Visit       Digestive   Chronic hepatitis C without hepatic coma (HCC)   Relevant Medications   fluconazole (DIFLUCAN) 150 MG tablet   mupirocin ointment (BACTROBAN) 2 %     Endocrine   Diabetes mellitus without complication (HCC)   Relevant Medications   SEGLUROMET 7.03-999 MG TABS     Nervous and Auditory   Cellulitis of ear, right - Primary   Relevant Medications    mupirocin ointment (BACTROBAN) 2 %     Genitourinary   Vaginal yeast infection   Relevant Medications   fluconazole (DIFLUCAN) 150 MG tablet   mupirocin ointment (BACTROBAN) 2 %    Meds ordered this encounter  Medications   fluconazole (DIFLUCAN) 150 MG tablet    Sig: Take 1 tablet po once. May repeat dose in 3 days as needed for persistent symptoms.    Dispense:  3 tablet    Refill:  0    Order Specific Question:   Supervising Provider    Answer:   Nani Gasser D [[id number]]   mupirocin ointment (BACTROBAN) 2 %    Sig: Apply small amount to effected area twice daily for 2 weeks then as needed    Dispense:  22 g    Refill:  1    Order Specific Question:   Supervising Provider    Answer:   Nani Gasser D [[id number]]   This note was dictated using Dragon Voice Recognition Software. Rapid proofreading was performed to expedite the delivery of the information. Despite proofreading, phonetic errors will occur which are common with this voice recognition software. Please take this into consideration. If there are any concerns, please contact our office.    Follow-up: Return in about 6 weeks (around 08/19/2021) for diabetes with HgbA1c check.    Carlean Jews, NP

## 2021-07-10 LAB — MYCOBACTERIA,CULT W/FLUOROCHROME SMEAR
MICRO NUMBER:: 12056783
SMEAR:: NONE SEEN
SPECIMEN QUALITY:: ADEQUATE

## 2021-07-15 ENCOUNTER — Ambulatory Visit (INDEPENDENT_AMBULATORY_CARE_PROVIDER_SITE_OTHER): Payer: 59

## 2021-07-15 DIAGNOSIS — A150 Tuberculosis of lung: Secondary | ICD-10-CM

## 2021-07-22 ENCOUNTER — Encounter: Payer: Self-pay | Admitting: Infectious Diseases

## 2021-07-22 ENCOUNTER — Ambulatory Visit (INDEPENDENT_AMBULATORY_CARE_PROVIDER_SITE_OTHER): Payer: 59 | Admitting: Infectious Diseases

## 2021-07-22 ENCOUNTER — Telehealth: Payer: Self-pay | Admitting: Internal Medicine

## 2021-07-22 ENCOUNTER — Other Ambulatory Visit: Payer: Self-pay

## 2021-07-22 VITALS — BP 149/85 | HR 85 | Temp 98.0°F | Ht 71.0 in | Wt 188.0 lb

## 2021-07-22 DIAGNOSIS — Z23 Encounter for immunization: Secondary | ICD-10-CM | POA: Diagnosis not present

## 2021-07-22 DIAGNOSIS — B182 Chronic viral hepatitis C: Secondary | ICD-10-CM | POA: Diagnosis not present

## 2021-07-22 DIAGNOSIS — A150 Tuberculosis of lung: Secondary | ICD-10-CM

## 2021-07-22 MED ORDER — AMOXICILLIN-POT CLAVULANATE 875-125 MG PO TABS: ORAL_TABLET | ORAL | 0 refills | Status: DC

## 2021-07-22 NOTE — Progress Notes (Signed)
Tried calling the pt and there was no answer- LMTCB.  

## 2021-07-22 NOTE — Progress Notes (Signed)
[facility]                                                             [address], [place], [place], [id number]                                                                  Phn. [phone]; Fax: [phone]                                                                             Date: 07/22/21  Reason for follow up: Chronic Hepatitis C   Assessment # Chronic Hepatitis C, s/p 8 weeks of Mavyret, Fibrosis score F3  # LUL cavitary lesion with increasing air fluid levels - Patient has a h/o Pulmonary Tuberculosis -completed treatment on May 03, 2021 per [facility] notes ( media tab). Chest xray 5/28 around end of pul TB tx with areas of nodularity and cavitation in the left lung with air fluid levels. Sputum AFB smear and cx on 6/27 and 7/5 negative to date. Also has a h/o Upper Airway syndome. Most likely a superimposed bacterial infection in previous cavitary lesion from TB. Less likely to be re-infection with MTB given negative sputum smear and cultures. R/o malignancy. Follows with Pulmonary    # Immunization counseling : Immune to Hepatitis A, s/p 1 dose of hep B # Medication Monitoring: switch back rosuvastatin to atorvastatin  # HCC screening - will plan for US abdomen every 6 months   Plan HCV RNA today  Hep B #2 Will send refills for augmentin given persistent cavity with increasing air fluid levels after 2 weeks course of augmentin. She is going to contact Pulmonary. It seems they were trying to contact her. Follow up with Pulmonary for possible need of excision/biopsy post abtx course  Follow up with us in 12 -14 weeks for HCV RNA ( SVR)  All questions and concerns were discussed and addressed. Patient verbalized understanding of the plan. ____________________________________________________________________________________________________________________  HPI: 60 year  old female with PMH of DM, HTN, HLD, TB on tx with [facility] who is referred for evaluation and management of Hepatitis C. She was diagnosed with Hepatitis C in 2008 but she did not find it out until 2014 when her girlfriend saw it on on of her medical records. 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 Her ex-husband also had Hepatitis C ( unsure if it was treated). Denies h/o blood transfusion, has tattoos in her body. Denies any personal or family h/o liver diseases or Liver cancer. Denies tx to date. She is interested for Hepatitis C treatment.   In terms of TB, she says she was diagnosed with pulmonary TB in February  2021 when she was being worked for persistent cough. 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 She was on [facility] tx since feb 2021. She says she was on home isolation until October 2021 and was released from isolation precautions at that time. She then moved to [facility] in October 2021 and continued to follow with [facility] for her TB care. She has been currently taking Isoniazid/Pyridoxine and Rifampin. 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 She has been tolerating her ATT well without any issues.   She complains of diarrhea for few years. She says she has loose stool at least three times a week. Denies any hematochezia, pain or burning. Denies nausea/vomiting. Abdominal pain is occasiona. Denies cough, chest pain and SOB. Denies GU symptoms. Denies rashes or joint complaints.  Denies recent hospital admission for liver diseases.  She is seeing an Orthopedics for tear in her rt arm and also has been referred to see a pulomonologist.   Social - says she is adopted, Quit smoking 8 years ago, alcohol once in a while. 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 Has not been sexually active for 8 years. She lives with her brother who is deaf and takes care of her. She has 1 adult  son and 3 adult daughters.  05/13/21 Here for follow-up for hepatitis C. denies any complaints.  She has been off pulmonary TB treatment since May 03, 2021 and was told that she has completed treatment for pulmonary TB.  She does not have a follow-up with [facility] department anymore.  She was seen by pulmonary for evaluation of her chronic cough Her recent chest x-ray Showed persistent areas of nodularity and cavitation in the left lung with air-fluid level suggesting superinfection of the pre-existing cavity.  It was recommended to repeat sputum AFB culture off treatment and possible need of FOB for localized bronchiectasis with likelihood of reinfection. she denies any fevers, chills or night sweats.  Denies any chest pain or shortness of breath.  Appetite is good and has gained 4 pounds in the last few months.   Discussed with her regarding her past hepatitis C labs and ultrasound abdomen findings.  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  She is not immune to hepatitis B and will vaccinate her against hepatitis B. I will also check for serology for hepatitis A. she drinks wine occasionally.  Denies smoking and using other illicit drugs.  06/13/21 Taking Mavyret 3 tablets a day without missing any doses. She is on her 4th week . Denies any side effects with the mavyret. She has cough and it is productive of clear sputum. She has been using Mucinex DM which has been helping with the cough. Denies any chest pain and SOB. Appetite is good and thinks she has gained weight. Her last sputum AFB cx from 6/27 ha sno growth so far. She is also closely following with Pulmonary. Discussed with her to collect one more sputum AFB cx and follow up in 4-6 weeks at the end of her hep C tx.   07/22/21 Here for end of treatment follow up for Hepatitis C. She completed her tx with Mavyret for 8 weeks, last dose on 07/17/21. Denies missing any  doses. She will be switching Rosuvastatin back to Atorvastatin as she has completed her Mavyret course.  Discussed about her HCC screening with [place] every 6 months or so given her fibrosis score of F3.  Patient is also being followed by Dr Sherene Sires for left upper lobe cavitary lesion which seems to have been present at the time of end of tx for pulmonary TB ( as per Chest xray done on 5/27/2). She has completed 2 weeks course of Augmentin on 8/15 and had a chest Xray done on 8/16 which showed Left upper lobe cavity with increased air-fluid level compared to most recent prior, concerning for worsening superimposed infection.  She denies any fevers, chills, sweats. Continues to cough, mostly dry and sometimes with minimal white phlegm. She can walk one quarter of a mile without getting SOB. She does not like to take stairs as it makes her SOB. 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 Denies any nausea, vomiting, abdominal pain and diarrhea. Appetite is good and denies weight loss.   ROS: 12 point ROS done with pertinent positives and negative as listed above   Past Medical History:  Diagnosis Date   Diabetes mellitus without complication (HCC)    Hepatitis C    High cholesterol    Hypertension    Tuberculosis    non contagious Dx 05/2020    Past Surgical History:  Procedure Laterality Date   CHOLECYSTECTOMY  1998   TUBAL LIGATION  01/1995   Current Outpatient Medications on File Prior to Visit  Medication Sig Dispense Refill   amoxicillin-clavulanate (AUGMENTIN) 875-125 MG tablet One twice daily with a glass of water 28 tablet 0   aspirin-acetaminophen-caffeine (EXCEDRIN MIGRAINE) 250-250-65 MG tablet Take 1 tablet by mouth every 6 (six) hours as needed for headache.     calcium carbonate (TUMS - DOSED IN MG ELEMENTAL CALCIUM) 500 MG chewable tablet Chew 1 tablet by mouth daily.     calcium-vitamin D (OSCAL WITH D)  500-200 MG-UNIT tablet Take 1 tablet by mouth.     dextromethorphan-guaiFENesin (MUCINEX DM) 30-600 MG 12hr tablet Take 1 tablet by mouth 2 (two) times daily.     famotidine (PEPCID) 20 MG tablet One after supper 30 tablet 11   fluconazole (DIFLUCAN) 150 MG tablet Take 1 tablet po once. May repeat dose in 3 days as needed for persistent symptoms. 3 tablet 0   Glecaprevir-Pibrentasvir (MAVYRET) 100-40 MG TABS Take 3 tablets by mouth daily with breakfast. 84 tablet 1   glimepiride (AMARYL) 4 MG tablet Take 1 tablet (4 mg total) by mouth 2 (two) times daily. 180 tablet 1   mupirocin ointment (BACTROBAN) 2 % Apply small amount to effected area twice daily for 2 weeks then as needed 22 g 1   pantoprazole (PROTONIX) 40 MG tablet Take 1 tablet by mouth daily.     rosuvastatin (CRESTOR) 10 MG tablet Take 1 tablet once daily while on Mavyret. (Continue taking atorvastatin until you start Mavyret) 30 tablet 1   SEGLUROMET 7.03-999 MG TABS Take 1 tablet by mouth 2 (two) times daily.     No current facility-administered medications on file prior to visit.     No Known Allergies  Social History   Socioeconomic History   Marital status: Widowed    Spouse name: Not on file   Number of children: 4   Years of education: Not on file   Highest education level: 12th grade  Occupational History   Not on file  Tobacco Use   Smoking  status: Former    Packs/day: 1.00    Years: 40.00    Pack years: 40.00    Types: Cigarettes    Quit date: 2014    Years since quitting: 8.6   Smokeless tobacco: Never  Vaping Use   Vaping Use: Unknown  Substance and Sexual Activity   Alcohol use: Yes    Comment: occasional   Drug use: Not Currently   Sexual activity: Not Currently  Other Topics Concern   Not on file  Social History Narrative   Not on file   Social Determinants of Health   Financial Resource Strain: Not on file  Food Insecurity: Not on file  Transportation Needs: Not on file  Physical  Activity: Not on file  Stress: Not on file  Social Connections: Not on file  Intimate Partner Violence: Not on file    Vitals BP (!) 149/85   Pulse 85   Temp 98 F (36.7 C)   Ht 5\' 11"  (1.803 m)   Wt 188 lb (85.3 kg)   BMI 26.22 kg/m    Examination  General - not in acute distress, comfortably sitting in chair HEENT - PEERLA, no pallor and no icterus Chest - b/l clear air entry, no additional sounds CVS- Normal s1s2, RRR Abdomen - Soft, Non tender , non distended Ext- no pedal edema Neuro: grossly normal Back - WNL Psych : calm and cooperative   Pertinent Imaging All pertinent labs/Imagings/notes reviewed. All pertinent plain films and CT images have been personally visualized and interpreted; radiology reports have been reviewed. Decision making incorporated into the Impression / Recommendations.   abdomen w elastography 04/14/21 FINDINGS: ULTRASOUND ABDOMEN   Gallbladder: Surgically absent.   Common bile duct: Diameter: 2.2 mm.   Liver: Coarsened hepatic echotexture without visible lesion on submitted images. Mildly nodular contour best seen along the hepatic renal fossa on image 73 of 131. portal vein is patent on color Doppler imaging with normal direction of blood flow towards the liver.   IVC: Not well seen. Normal appearance on grayscale with respect to visualized portions.   Pancreas: Visualized portion unremarkable.   Spleen: Size and appearance within normal limits.   Right Kidney: Length: 11.2 cm. Mild increased echogenicity. Cystic area in the interpolar RIGHT kidney showing enhanced through transmission with well-circumscribed margins 1.5 x 1.1 x 1.4 cm   Left Kidney: Length: 10.2 cm. 1.3 x 0.9 x 1.5 cm cystic area in the lateral interpolar LEFT kidney showing enhanced through transmission. Another similar area in the lateral interpolar LEFT kidney measuring 1.3 x 1.1 x 1.5 cm.   Abdominal aorta: No aneurysm visualized.   Other  findings: No ascites.   ULTRASOUND HEPATIC ELASTOGRAPHY   Device: Siemens Helix VTQ   Patient position: Supine   Transducer 5 C1   Number of measurements: 10   Hepatic segment:  VIII   Median kPa: 6.0   IQR: 1.7   IQR/Median kPa ratio: 0.3   Data quality: IQR/Median kPa ratio of 0.3 or greater indicates reduced accuracy   Diagnostic category: < or = 9 kPa: in the absence of other known clinical signs, rules out cACLD   The use of hepatic elastography is applicable to patients with viral hepatitis and non-alcoholic fatty liver disease. At this time, there is insufficient data for the referenced cut-off values and use in other causes of liver disease, including alcoholic liver disease. Patients, however, may be assessed by elastography and serve as their own reference standard/baseline.   In patients  with non-alcoholic liver disease, the values suggesting compensated advanced chronic liver disease (cACLD) may be lower, and patients may need additional testing with elasticity results of 7-9 kPa.   Please note that abnormal hepatic elasticity and shear wave velocities may also be identified in clinical settings other than with hepatic fibrosis, such as: acute hepatitis, elevated right heart and central venous pressures including use of beta blockers, veno-occlusive disease (Budd-Chiari), infiltrative processes such as mastocytosis/amyloidosis/infiltrative tumor/lymphoma, extrahepatic cholestasis, with hyperemia in the post-prandial state, and with liver transplantation. Correlation with patient history, laboratory data, and clinical condition recommended.   Diagnostic Categories:   < or =5 kPa: high probability of being normal   < or =9 kPa: in the absence of other known clinical signs, rules out cACLD   >9 kPa and ?13 kPa: suggestive of cACLD, but needs further testing   >13 kPa: highly suggestive of cACLD   > or =17 kPa: highly suggestive of cACLD with an  increased probability of clinically significant portal hypertension   IMPRESSION: ULTRASOUND ABDOMEN:   Coarsened hepatic echotexture raising the question of liver disease.   Post cholecystectomy.   No acute findings in the abdomen with renal cysts.   ULTRASOUND HEPATIC ELASTOGRAPHY:   Median kPa:  6.0   Diagnostic category: < or = 9 kPa: in the absence of other known clinical signs, rules out cACLD IQR to median kilopascals ratio at 0.3 which may indicate reduced accuracy of current data. Sonographic window is challenging based on assessment of submitted images and more than 1 site was assessed. Best data was reported.   I have spent 35  minutes for this patient encounter including  review of prior medical records with greater than 50% of time in face to face counsel of the patient/discussing diagnostics and plan of care.   Electronically signed by:  Odette Fraction, MD Infectious Disease Physician [facility] [address] [place], [place] [address] Phone: [phone]  Fax: [phone]

## 2021-07-22 NOTE — Telephone Encounter (Signed)
Call patient :  Study is improved  and I've discussed it with colleagues who feel next step is see a thoracic surgeon to review a surgical option as we've done all we can with medications   I have called the pt and she voiced her understanding of these results.  She is ok with the referral to see thoracic surgeon.  I have placed the referral. Nothing further is needed.

## 2021-07-23 ENCOUNTER — Telehealth: Payer: Self-pay | Admitting: Internal Medicine

## 2021-07-23 DIAGNOSIS — R918 Other nonspecific abnormal finding of lung field: Secondary | ICD-10-CM

## 2021-07-23 NOTE — Telephone Encounter (Signed)
MW, can you please advise if you are ok with ordered a CT scan? Thanks.

## 2021-07-23 NOTE — Telephone Encounter (Signed)
Fine no contrast

## 2021-07-23 NOTE — Telephone Encounter (Signed)
Danielle Hurley Our surgeon would like a CT Chest ordered/scheduled before we schedule appointment here at our office for better images.  Thanks   [employer]

## 2021-07-25 NOTE — Telephone Encounter (Signed)
Called and spoke with patient. She is aware to receive a call to schedule her CT scan. She is also aware that this is needed to complete her [facility] referral.   Order has been placed.   Nothing further needed at time of call.   Will route message to [place] so she is aware.

## 2021-07-27 LAB — LIVER FIBROSIS, FIBROTEST-ACTITEST
ALT: 21 U/L (ref 6–29)
Alpha-2-Macroglobulin: 372 mg/dL — ABNORMAL HIGH (ref 106–279)
Apolipoprotein A1: 173 mg/dL (ref 101–198)
Bilirubin: 0.4 mg/dL (ref 0.2–1.2)
Fibrosis Score: 0.32
GGT: 13 U/L (ref 3–65)
Haptoglobin: 112 mg/dL (ref 43–212)
Necroinflammat ACT Score: 0.09
Reference ID: 4000726

## 2021-07-27 LAB — HEPATITIS C RNA QUANTITATIVE
HCV Quantitative Log: <1.18 log IU/mL
HCV RNA, PCR, QN: <15 IU/mL

## 2021-07-29 LAB — MYCOBACTERIA,CULT W/FLUOROCHROME SMEAR
MICRO NUMBER:: 12126313
SMEAR:: NONE SEEN
SPECIMEN QUALITY:: ADEQUATE

## 2021-08-05 ENCOUNTER — Ambulatory Visit (HOSPITAL_COMMUNITY)
Admission: RE | Admit: 2021-08-05 | Discharge: 2021-08-05 | Disposition: A | Discharge status: Home / Self Care | Payer: 59 | Source: Ambulatory Visit | Attending: Internal Medicine | Admitting: Internal Medicine

## 2021-08-05 ENCOUNTER — Other Ambulatory Visit: Payer: Self-pay

## 2021-08-05 DIAGNOSIS — R918 Other nonspecific abnormal finding of lung field: Secondary | ICD-10-CM | POA: Insufficient documentation

## 2021-08-07 ENCOUNTER — Institutional Professional Consult (permissible substitution) (INDEPENDENT_AMBULATORY_CARE_PROVIDER_SITE_OTHER): Payer: 59 | Admitting: Thoracic Surgery (Cardiothoracic Vascular Surgery)

## 2021-08-07 ENCOUNTER — Other Ambulatory Visit: Payer: Self-pay

## 2021-08-07 VITALS — BP 116/74 | HR 85 | Resp 20 | Ht 71.0 in | Wt 185.0 lb

## 2021-08-07 DIAGNOSIS — R911 Solitary pulmonary nodule: Secondary | ICD-10-CM

## 2021-08-07 NOTE — Progress Notes (Signed)
PCP is Carlean Jews, NP Referring Provider is Nyoka Cowden, MD  Chief Complaint  Patient presents with   Lung Lesion    Surgical consult, Chest CT 08/05/21    HPI: [name] sent for consultation regarding cavitary lung nodule.  [name] is a 60 year old woman with a past medical history significant for tobacco abuse, type 2 diabetes without complication, hepatitis C, hypertension, hyperlipidemia, and tuberculosis.  She had a left upper lobe lung nodule dating back about a year.  She was diagnosed with tuberculosis based on CT-guided needle biopsy.  She was treated with antibiotics.  She moved to [place].  She has been followed by Dr. Elinor Parkinson from infectious disease.  She completed her course of treatment for tuberculosis in June.  She had follow-up chest x-rays which showed progressive filling of a cavitary lesion in the left upper lobe.  She was treated with empiric Augmentin for that.  She initially received a 2-week course but now is on an additional 4-week course.  She was not having any fevers, chills, or sweats when she was started on antibiotics.  She has not had any change in appetite or weight loss.  She does have a 40-pack-year history of smoking prior to quitting in 2014.  She has a chronic cough occasionally productive of more often nonproductive recently.  Also occasional wheezing.  Zubrod Score: At the time of surgery this patient's most appropriate activity status/level should be described as: []     0    Normal activity, no symptoms [x]     1    Restricted in physical strenuous activity but ambulatory, able to do out light work []     2    Ambulatory and capable of self care, unable to do work activities, up and about >50 % of waking hours                              []     3    Only limited self care, in bed greater than 50% of waking hours []     4    Completely disabled, no self care, confined to bed or chair []     5    Moribund  Past Medical History:   Diagnosis Date   Diabetes mellitus without complication (HCC)    Hepatitis C    High cholesterol    Hypertension    Tuberculosis    non contagious Dx 05/2020    Past Surgical History:  Procedure Laterality Date   CHOLECYSTECTOMY  1998   TUBAL LIGATION  01/1995    No family history on file.  Social History Social History   Tobacco Use   Smoking status: Former    Packs/day: 1.00    Years: 40.00    Pack years: 40.00    Types: Cigarettes    Quit date: 2014    Years since quitting: 8.6   Smokeless tobacco: Never  Vaping Use   Vaping Use: Unknown  Substance Use Topics   Alcohol use: Yes    Comment: occasional   Drug use: Not Currently    Current Outpatient Medications  Medication Sig Dispense Refill   amoxicillin-clavulanate (AUGMENTIN) 875-125 MG tablet One twice daily with a glass of water 60 tablet 0   aspirin-acetaminophen-caffeine (EXCEDRIN MIGRAINE) 250-250-65 MG tablet Take 1 tablet by mouth every 6 (six) hours as needed for headache.     atorvastatin (LIPITOR) 10 MG tablet Take 10 mg by mouth  daily.     calcium carbonate (TUMS - DOSED IN MG ELEMENTAL CALCIUM) 500 MG chewable tablet Chew 1 tablet by mouth daily.     calcium-vitamin D (OSCAL WITH D) 500-200 MG-UNIT tablet Take 1 tablet by mouth.     dextromethorphan-guaiFENesin (MUCINEX DM) 30-600 MG 12hr tablet Take 1 tablet by mouth 2 (two) times daily.     famotidine (PEPCID) 20 MG tablet One after supper 30 tablet 11   Glecaprevir-Pibrentasvir (MAVYRET) 100-40 MG TABS Take 3 tablets by mouth daily with breakfast. 84 tablet 1   glimepiride (AMARYL) 4 MG tablet Take 1 tablet (4 mg total) by mouth 2 (two) times daily. 180 tablet 1   mupirocin ointment (BACTROBAN) 2 % Apply small amount to effected area twice daily for 2 weeks then as needed 22 g 1   pantoprazole (PROTONIX) 40 MG tablet Take 1 tablet by mouth daily.     SEGLUROMET 7.03-999 MG TABS Take 1 tablet by mouth 2 (two) times daily.     valsartan (DIOVAN)  160 MG tablet Take 160 mg by mouth daily.     rosuvastatin (CRESTOR) 10 MG tablet Take 1 tablet once daily while on Mavyret. (Continue taking atorvastatin until you start Mavyret) 30 tablet 1   No current facility-administered medications for this visit.    No Known Allergies  Review of Systems  Constitutional:  Positive for unexpected weight change (Weight gain). Negative for activity change.  HENT:  Negative for trouble swallowing and voice change.   Respiratory:  Positive for cough and wheezing. Negative for shortness of breath.   Cardiovascular:  Negative for chest pain.  Gastrointestinal:  Positive for abdominal pain (Heartburn). Negative for abdominal distention.  Genitourinary:  Positive for frequency. Negative for dysuria.  Musculoskeletal:  Negative for arthralgias and myalgias.  Neurological:  Positive for numbness. Negative for syncope and weakness.  Hematological:  Bruises/bleeds easily.  All other systems reviewed and are negative.  BP 116/74   Pulse 85   Resp 20   Ht 5\' 11"  (1.803 m)   Wt 185 lb (83.9 kg)   SpO2 96% Comment: RA  BMI 25.80 kg/m  Physical Exam Vitals reviewed.  Constitutional:      General: She is not in acute distress.    Appearance: Normal appearance.  HENT:     Head: Normocephalic and atraumatic.  Eyes:     General: No scleral icterus.    Extraocular Movements: Extraocular movements intact.  Cardiovascular:     Rate and Rhythm: Normal rate and regular rhythm.     Heart sounds: Normal heart sounds. No murmur heard. Pulmonary:     Effort: Pulmonary effort is normal. No respiratory distress.     Breath sounds: Normal breath sounds. No wheezing.  Abdominal:     General: There is no distension.     Palpations: Abdomen is soft.     Tenderness: There is no abdominal tenderness.  Musculoskeletal:     Cervical back: Neck supple.  Lymphadenopathy:     Cervical: No cervical adenopathy.  Skin:    General: Skin is warm and dry.  Neurological:      General: No focal deficit present.     Mental Status: She is alert and oriented to person, place, and time.     Cranial Nerves: No cranial nerve deficit.     Motor: No weakness.     Diagnostic Tests: CT CHEST WITHOUT CONTRAST   TECHNIQUE: Multidetector CT imaging of the chest was performed following the standard protocol without  IV contrast.   COMPARISON:  Chest radiographs, 07/15/2021   FINDINGS: Cardiovascular: No significant vascular findings. Normal heart size. No pericardial effusion.   Mediastinum/Nodes: No enlarged mediastinal, hilar, or axillary lymph nodes. Thyroid gland, trachea, and esophagus demonstrate no significant findings.   Lungs/Pleura: There is a spiculated appearing cavitary lesion of the left apex containing a small air level, measuring 3.4 x 3.3 cm (series 7, image 34). There is an additional spiculated, mixed fluid and soft tissue attenuation lesion of the superior segment left lower lobe measuring 2.5 x 2.2 cm (series 7, image 63). There is a somewhat spiculated appearing cavitary lesion of the medial right upper lobe, containing a small air level. Diffuse bilateral bronchial wall thickening. No pleural effusion or pneumothorax.   Upper Abdomen: No acute abnormality.  Status post cholecystectomy.   Musculoskeletal: No chest wall mass or suspicious bone lesions identified.   IMPRESSION: 1. Somewhat spiculated appearing cavitary lesions of the left upper lobe, left lower lobe, and right upper lobe, generally in keeping with prior radiographic findings. Multilobar distribution would be unusual for tuberculosis, and both other infectious agents and malignancy are substantial differential considerations. At absolute minimum, recommend additional follow-up CT in 3-6 months to assess for stability or resolution following treatment. 2. Diffuse bilateral bronchial wall thickening, consistent with nonspecific infectious or inflammatory bronchitis.      Electronically Signed   By: Lauralyn Primes M.D.   On: 08/06/2021 10:07    Impression: Danielle Hurley is a 60 year old woman with a past medical history significant for tobacco abuse, type 2 diabetes without complication, hepatitis C, hypertension, hyperlipidemia, and tuberculosis.  She was diagnosed with tuberculosis a year ago while living in [place].  She completed treatment for that in June.  She has been feeling well but serial chest x-ray showed progressive filling of a cavitary lesion with fluid.  She has been treated with antibiotics for possible bacterial superinfection.  Her CT shows multiple cavitary lesions bilaterally.  There are least 2 areas in the right upper lobe in addition to the left upper lobe and the superior segment of the left lower lobe.  I do not think surgery is a particularly good option for treatment.  It would be reserved for significant complications such as hemoptysis.  I think the next step in her work-up would be a navigational bronchoscopy to get samples from multiple lesions on both sides for cultures and pathology.  I recommended we proceed with a navigational bronchoscopy.  I informed her of the general nature of the procedure.  She understands it is endoscopic but would be done in the operating room under general anesthesia.  I informed her the indications, risks, benefits, and alternatives.  She understands the risks include, but not limited to death, MI, DVT, PE, bleeding, pneumothorax, as well as possibility of other unforeseeable complications.  She accepts the risk and agrees to proceed.  Plan: We will call her to schedule.  I spent over 30 minutes in review of records, images, and in consultation with Mrs. Olvey today. Loreli Slot, MD [facility] [phone]

## 2021-08-07 NOTE — H&P (View-Only) (Signed)
PCP is Carlean Jews, NP Referring Provider is Nyoka Cowden, MD  Chief Complaint  Patient presents with   Lung Lesion    Surgical consult, Chest CT 08/05/21    HPI: Danielle Hurley sent for consultation regarding cavitary lung nodule.  Danielle Hurley is a 60 year old woman with a past medical history significant for tobacco abuse, type 2 diabetes without complication, hepatitis C, hypertension, hyperlipidemia, and tuberculosis.  She had a left upper lobe lung nodule dating back about a year.  She was diagnosed with tuberculosis based on CT-guided needle biopsy.  She was treated with antibiotics.  She moved to [place].  She has been followed by Dr. Elinor Parkinson from infectious disease.  She completed her course of treatment for tuberculosis in June.  She had follow-up chest x-rays which showed progressive filling of a cavitary lesion in the left upper lobe.  She was treated with empiric Augmentin for that.  She initially received a 2-week course but now is on an additional 4-week course.  She was not having any fevers, chills, or sweats when she was started on antibiotics.  She has not had any change in appetite or weight loss.  She does have a 40-pack-year history of smoking prior to quitting in 2014.  She has a chronic cough occasionally productive of more often nonproductive recently.  Also occasional wheezing.  Zubrod Score: At the time of surgery this patient's most appropriate activity status/level should be described as: []     0    Normal activity, no symptoms [x]     1    Restricted in physical strenuous activity but ambulatory, able to do out light work []     2    Ambulatory and capable of self care, unable to do work activities, up and about >50 % of waking hours                              []     3    Only limited self care, in bed greater than 50% of waking hours []     4    Completely disabled, no self care, confined to bed or chair []     5    Moribund  Past Medical History:   Diagnosis Date   Diabetes mellitus without complication (HCC)    Hepatitis C    High cholesterol    Hypertension    Tuberculosis    non contagious Dx 05/2020    Past Surgical History:  Procedure Laterality Date   CHOLECYSTECTOMY  1998   TUBAL LIGATION  01/1995    No family history on file.  Social History Social History   Tobacco Use   Smoking status: Former    Packs/day: 1.00    Years: 40.00    Pack years: 40.00    Types: Cigarettes    Quit date: 2014    Years since quitting: 8.6   Smokeless tobacco: Never  Vaping Use   Vaping Use: Unknown  Substance Use Topics   Alcohol use: Yes    Comment: occasional   Drug use: Not Currently    Current Outpatient Medications  Medication Sig Dispense Refill   amoxicillin-clavulanate (AUGMENTIN) 875-125 MG tablet One twice daily with a glass of water 60 tablet 0   aspirin-acetaminophen-caffeine (EXCEDRIN MIGRAINE) 250-250-65 MG tablet Take 1 tablet by mouth every 6 (six) hours as needed for headache.     atorvastatin (LIPITOR) 10 MG tablet Take 10 mg by mouth  daily.     calcium carbonate (TUMS - DOSED IN MG ELEMENTAL CALCIUM) 500 MG chewable tablet Chew 1 tablet by mouth daily.     calcium-vitamin D (OSCAL WITH D) 500-200 MG-UNIT tablet Take 1 tablet by mouth.     dextromethorphan-guaiFENesin (MUCINEX DM) 30-600 MG 12hr tablet Take 1 tablet by mouth 2 (two) times daily.     famotidine (PEPCID) 20 MG tablet One after supper 30 tablet 11   Glecaprevir-Pibrentasvir (MAVYRET) 100-40 MG TABS Take 3 tablets by mouth daily with breakfast. 84 tablet 1   glimepiride (AMARYL) 4 MG tablet Take 1 tablet (4 mg total) by mouth 2 (two) times daily. 180 tablet 1   mupirocin ointment (BACTROBAN) 2 % Apply small amount to effected area twice daily for 2 weeks then as needed 22 g 1   pantoprazole (PROTONIX) 40 MG tablet Take 1 tablet by mouth daily.     SEGLUROMET 7.03-999 MG TABS Take 1 tablet by mouth 2 (two) times daily.     valsartan (DIOVAN)  160 MG tablet Take 160 mg by mouth daily.     rosuvastatin (CRESTOR) 10 MG tablet Take 1 tablet once daily while on Mavyret. (Continue taking atorvastatin until you start Mavyret) 30 tablet 1   No current facility-administered medications for this visit.    No Known Allergies  Review of Systems  Constitutional:  Positive for unexpected weight change (Weight gain). Negative for activity change.  HENT:  Negative for trouble swallowing and voice change.   Respiratory:  Positive for cough and wheezing. Negative for shortness of breath.   Cardiovascular:  Negative for chest pain.  Gastrointestinal:  Positive for abdominal pain (Heartburn). Negative for abdominal distention.  Genitourinary:  Positive for frequency. Negative for dysuria.  Musculoskeletal:  Negative for arthralgias and myalgias.  Neurological:  Positive for numbness. Negative for syncope and weakness.  Hematological:  Bruises/bleeds easily.  All other systems reviewed and are negative.  BP 116/74   Pulse 85   Resp 20   Ht 5\' 11"  (1.803 m)   Wt 185 lb (83.9 kg)   SpO2 96% Comment: RA  BMI 25.80 kg/m  Physical Exam Vitals reviewed.  Constitutional:      General: She is not in acute distress.    Appearance: Normal appearance.  HENT:     Head: Normocephalic and atraumatic.  Eyes:     General: No scleral icterus.    Extraocular Movements: Extraocular movements intact.  Cardiovascular:     Rate and Rhythm: Normal rate and regular rhythm.     Heart sounds: Normal heart sounds. No murmur heard. Pulmonary:     Effort: Pulmonary effort is normal. No respiratory distress.     Breath sounds: Normal breath sounds. No wheezing.  Abdominal:     General: There is no distension.     Palpations: Abdomen is soft.     Tenderness: There is no abdominal tenderness.  Musculoskeletal:     Cervical back: Neck supple.  Lymphadenopathy:     Cervical: No cervical adenopathy.  Skin:    General: Skin is warm and dry.  Neurological:      General: No focal deficit present.     Mental Status: She is alert and oriented to person, place, and time.     Cranial Nerves: No cranial nerve deficit.     Motor: No weakness.     Diagnostic Tests: CT CHEST WITHOUT CONTRAST   TECHNIQUE: Multidetector CT imaging of the chest was performed following the standard protocol without  IV contrast.   COMPARISON:  Chest radiographs, 07/15/2021   FINDINGS: Cardiovascular: No significant vascular findings. Normal heart size. No pericardial effusion.   Mediastinum/Nodes: No enlarged mediastinal, hilar, or axillary lymph nodes. Thyroid gland, trachea, and esophagus demonstrate no significant findings.   Lungs/Pleura: There is a spiculated appearing cavitary lesion of the left apex containing a small air level, measuring 3.4 x 3.3 cm (series 7, image 34). There is an additional spiculated, mixed fluid and soft tissue attenuation lesion of the superior segment left lower lobe measuring 2.5 x 2.2 cm (series 7, image 63). There is a somewhat spiculated appearing cavitary lesion of the medial right upper lobe, containing a small air level. Diffuse bilateral bronchial wall thickening. No pleural effusion or pneumothorax.   Upper Abdomen: No acute abnormality.  Status post cholecystectomy.   Musculoskeletal: No chest wall mass or suspicious bone lesions identified.   IMPRESSION: 1. Somewhat spiculated appearing cavitary lesions of the left upper lobe, left lower lobe, and right upper lobe, generally in keeping with prior radiographic findings. Multilobar distribution would be unusual for tuberculosis, and both other infectious agents and malignancy are substantial differential considerations. At absolute minimum, recommend additional follow-up CT in 3-6 months to assess for stability or resolution following treatment. 2. Diffuse bilateral bronchial wall thickening, consistent with nonspecific infectious or inflammatory bronchitis.      Electronically Signed   By: Lauralyn Primes M.D.   On: 08/06/2021 10:07    Impression: Saul Dorsi is a 60 year old woman with a past medical history significant for tobacco abuse, type 2 diabetes without complication, hepatitis C, hypertension, hyperlipidemia, and tuberculosis.  She was diagnosed with tuberculosis a year ago while living in [place].  She completed treatment for that in June.  She has been feeling well but serial chest x-ray showed progressive filling of a cavitary lesion with fluid.  She has been treated with antibiotics for possible bacterial superinfection.  Her CT shows multiple cavitary lesions bilaterally.  There are least 2 areas in the right upper lobe in addition to the left upper lobe and the superior segment of the left lower lobe.  I do not think surgery is a particularly good option for treatment.  It would be reserved for significant complications such as hemoptysis.  I think the next step in her work-up would be a navigational bronchoscopy to get samples from multiple lesions on both sides for cultures and pathology.  I recommended we proceed with a navigational bronchoscopy.  I informed her of the general nature of the procedure.  She understands it is endoscopic but would be done in the operating room under general anesthesia.  I informed her the indications, risks, benefits, and alternatives.  She understands the risks include, but not limited to death, MI, DVT, PE, bleeding, pneumothorax, as well as possibility of other unforeseeable complications.  She accepts the risk and agrees to proceed.  Plan: We will call her to schedule.  I spent over 30 minutes in review of records, images, and in consultation with Mrs. Olvey today. Loreli Slot, MD [facility] [phone]

## 2021-08-07 NOTE — H&P (View-Only) (Signed)
PCP is Carlean Jews, NP Referring Provider is Nyoka Cowden, MD  Chief Complaint  Patient presents with   Lung Lesion    Surgical consult, Chest CT 08/05/21    HPI: [name] sent for consultation regarding cavitary lung nodule.  Danielle Hurley is a 60 year old woman with a past medical history significant for tobacco abuse, type 2 diabetes without complication, hepatitis C, hypertension, hyperlipidemia, and tuberculosis.  She had a left upper lobe lung nodule dating back about a year.  She was diagnosed with tuberculosis based on CT-guided needle biopsy.  She was treated with antibiotics.  She moved to [place].  She has been followed by Dr. Elinor Parkinson from infectious disease.  She completed her course of treatment for tuberculosis in June.  She had follow-up chest x-rays which showed progressive filling of a cavitary lesion in the left upper lobe.  She was treated with empiric Augmentin for that.  She initially received a 2-week course but now is on an additional 4-week course.  She was not having any fevers, chills, or sweats when she was started on antibiotics.  She has not had any change in appetite or weight loss.  She does have a 40-pack-year history of smoking prior to quitting in 2014.  She has a chronic cough occasionally productive of more often nonproductive recently.  Also occasional wheezing.  Zubrod Score: At the time of surgery this patient's most appropriate activity status/level should be described as: []     0    Normal activity, no symptoms [x]     1    Restricted in physical strenuous activity but ambulatory, able to do out light work []     2    Ambulatory and capable of self care, unable to do work activities, up and about >50 % of waking hours                              []     3    Only limited self care, in bed greater than 50% of waking hours []     4    Completely disabled, no self care, confined to bed or chair []     5    Moribund  Past Medical History:   Diagnosis Date   Diabetes mellitus without complication (HCC)    Hepatitis C    High cholesterol    Hypertension    Tuberculosis    non contagious Dx 05/2020    Past Surgical History:  Procedure Laterality Date   CHOLECYSTECTOMY  1998   TUBAL LIGATION  01/1995    No family history on file.  Social History Social History   Tobacco Use   Smoking status: Former    Packs/day: 1.00    Years: 40.00    Pack years: 40.00    Types: Cigarettes    Quit date: 2014    Years since quitting: 8.6   Smokeless tobacco: Never  Vaping Use   Vaping Use: Unknown  Substance Use Topics   Alcohol use: Yes    Comment: occasional   Drug use: Not Currently    Current Outpatient Medications  Medication Sig Dispense Refill   amoxicillin-clavulanate (AUGMENTIN) 875-125 MG tablet One twice daily with a glass of water 60 tablet 0   aspirin-acetaminophen-caffeine (EXCEDRIN MIGRAINE) 250-250-65 MG tablet Take 1 tablet by mouth every 6 (six) hours as needed for headache.     atorvastatin (LIPITOR) 10 MG tablet Take 10 mg by mouth  daily.     calcium carbonate (TUMS - DOSED IN MG ELEMENTAL CALCIUM) 500 MG chewable tablet Chew 1 tablet by mouth daily.     calcium-vitamin D (OSCAL WITH D) 500-200 MG-UNIT tablet Take 1 tablet by mouth.     dextromethorphan-guaiFENesin (MUCINEX DM) 30-600 MG 12hr tablet Take 1 tablet by mouth 2 (two) times daily.     famotidine (PEPCID) 20 MG tablet One after supper 30 tablet 11   Glecaprevir-Pibrentasvir (MAVYRET) 100-40 MG TABS Take 3 tablets by mouth daily with breakfast. 84 tablet 1   glimepiride (AMARYL) 4 MG tablet Take 1 tablet (4 mg total) by mouth 2 (two) times daily. 180 tablet 1   mupirocin ointment (BACTROBAN) 2 % Apply small amount to effected area twice daily for 2 weeks then as needed 22 g 1   pantoprazole (PROTONIX) 40 MG tablet Take 1 tablet by mouth daily.     SEGLUROMET 7.03-999 MG TABS Take 1 tablet by mouth 2 (two) times daily.     valsartan (DIOVAN)  160 MG tablet Take 160 mg by mouth daily.     rosuvastatin (CRESTOR) 10 MG tablet Take 1 tablet once daily while on Mavyret. (Continue taking atorvastatin until you start Mavyret) 30 tablet 1   No current facility-administered medications for this visit.    No Known Allergies  Review of Systems  Constitutional:  Positive for unexpected weight change (Weight gain). Negative for activity change.  HENT:  Negative for trouble swallowing and voice change.   Respiratory:  Positive for cough and wheezing. Negative for shortness of breath.   Cardiovascular:  Negative for chest pain.  Gastrointestinal:  Positive for abdominal pain (Heartburn). Negative for abdominal distention.  Genitourinary:  Positive for frequency. Negative for dysuria.  Musculoskeletal:  Negative for arthralgias and myalgias.  Neurological:  Positive for numbness. Negative for syncope and weakness.  Hematological:  Bruises/bleeds easily.  All other systems reviewed and are negative.  BP 116/74   Pulse 85   Resp 20   Ht 5\' 11"  (1.803 m)   Wt 185 lb (83.9 kg)   SpO2 96% Comment: RA  BMI 25.80 kg/m  Physical Exam Vitals reviewed.  Constitutional:      General: She is not in acute distress.    Appearance: Normal appearance.  HENT:     Head: Normocephalic and atraumatic.  Eyes:     General: No scleral icterus.    Extraocular Movements: Extraocular movements intact.  Cardiovascular:     Rate and Rhythm: Normal rate and regular rhythm.     Heart sounds: Normal heart sounds. No murmur heard. Pulmonary:     Effort: Pulmonary effort is normal. No respiratory distress.     Breath sounds: Normal breath sounds. No wheezing.  Abdominal:     General: There is no distension.     Palpations: Abdomen is soft.     Tenderness: There is no abdominal tenderness.  Musculoskeletal:     Cervical back: Neck supple.  Lymphadenopathy:     Cervical: No cervical adenopathy.  Skin:    General: Skin is warm and dry.  Neurological:      General: No focal deficit present.     Mental Status: She is alert and oriented to person, place, and time.     Cranial Nerves: No cranial nerve deficit.     Motor: No weakness.     Diagnostic Tests: CT CHEST WITHOUT CONTRAST   TECHNIQUE: Multidetector CT imaging of the chest was performed following the standard protocol without  IV contrast.   COMPARISON:  Chest radiographs, 07/15/2021   FINDINGS: Cardiovascular: No significant vascular findings. Normal heart size. No pericardial effusion.   Mediastinum/Nodes: No enlarged mediastinal, hilar, or axillary lymph nodes. Thyroid gland, trachea, and esophagus demonstrate no significant findings.   Lungs/Pleura: There is a spiculated appearing cavitary lesion of the left apex containing a small air level, measuring 3.4 x 3.3 cm (series 7, image 34). There is an additional spiculated, mixed fluid and soft tissue attenuation lesion of the superior segment left lower lobe measuring 2.5 x 2.2 cm (series 7, image 63). There is a somewhat spiculated appearing cavitary lesion of the medial right upper lobe, containing a small air level. Diffuse bilateral bronchial wall thickening. No pleural effusion or pneumothorax.   Upper Abdomen: No acute abnormality.  Status post cholecystectomy.   Musculoskeletal: No chest wall mass or suspicious bone lesions identified.   IMPRESSION: 1. Somewhat spiculated appearing cavitary lesions of the left upper lobe, left lower lobe, and right upper lobe, generally in keeping with prior radiographic findings. Multilobar distribution would be unusual for tuberculosis, and both other infectious agents and malignancy are substantial differential considerations. At absolute minimum, recommend additional follow-up CT in 3-6 months to assess for stability or resolution following treatment. 2. Diffuse bilateral bronchial wall thickening, consistent with nonspecific infectious or inflammatory bronchitis.      Electronically Signed   By: Lauralyn Primes M.D.   On: 08/06/2021 10:07    Impression: Danielle Hurley is a 60 year old woman with a past medical history significant for tobacco abuse, type 2 diabetes without complication, hepatitis C, hypertension, hyperlipidemia, and tuberculosis.  She was diagnosed with tuberculosis a year ago while living in [place].  She completed treatment for that in June.  She has been feeling well but serial chest x-ray showed progressive filling of a cavitary lesion with fluid.  She has been treated with antibiotics for possible bacterial superinfection.  Her CT shows multiple cavitary lesions bilaterally.  There are least 2 areas in the right upper lobe in addition to the left upper lobe and the superior segment of the left lower lobe.  I do not think surgery is a particularly good option for treatment.  It would be reserved for significant complications such as hemoptysis.  I think the next step in her work-up would be a navigational bronchoscopy to get samples from multiple lesions on both sides for cultures and pathology.  I recommended we proceed with a navigational bronchoscopy.  I informed her of the general nature of the procedure.  She understands it is endoscopic but would be done in the operating room under general anesthesia.  I informed her the indications, risks, benefits, and alternatives.  She understands the risks include, but not limited to death, MI, DVT, PE, bleeding, pneumothorax, as well as possibility of other unforeseeable complications.  She accepts the risk and agrees to proceed.  Plan: We will call her to schedule.  I spent over 30 minutes in review of records, images, and in consultation with Mrs. Olvey today. Loreli Slot, MD [facility] [phone]

## 2021-08-08 ENCOUNTER — Encounter: Payer: Self-pay | Admitting: *Deleted

## 2021-08-08 ENCOUNTER — Other Ambulatory Visit: Payer: Self-pay | Admitting: *Deleted

## 2021-08-08 ENCOUNTER — Encounter: Payer: 59 | Admitting: Thoracic Surgery (Cardiothoracic Vascular Surgery)

## 2021-08-08 DIAGNOSIS — R911 Solitary pulmonary nodule: Secondary | ICD-10-CM

## 2021-08-15 NOTE — Progress Notes (Signed)
Surgical Instructions    Your procedure is scheduled on Wednesday, September 21st, 2022.   Report to [facility] Main Entrance "A" at 06:30 A.M., then check in with the Admitting office.  Call this number if you have problems the morning of surgery:  [phone]   If you have any questions prior to your surgery date call [phone]: Open Monday-Friday 8am-4pm    Remember:  Do not eat after midnight the night before your surgery  You may drink clear liquids until 05:30 the morning of your surgery.   Clear liquids allowed are: Water, Non-Citrus Juices (without pulp), Carbonated Beverages, Clear Tea, Black Coffee ONLY (NO MILK, CREAM OR POWDERED CREAMER of any kind), and Gatorade    Take these medicines the morning of surgery with A SIP OF WATER:  amoxicillin-clavulanate (AUGMENTIN) atorvastatin (LIPITOR) guaifenesin (MUCUS RELIEF) pantoprazole (PROTONIX)  If needed:  cetirizine (ZYRTEC) famotidine (PEPCID) HYDROcodone-acetaminophen (NORCO/VICODIN)  As of today, STOP taking any Aspirin (unless otherwise instructed by your surgeon) Aleve, Naproxen, Ibuprofen, Motrin, Advil, Goody's, BC's, all herbal medications, fish oil, and all vitamins.           WHAT DO I DO ABOUT MY DIABETES MEDICATION?   Do not take SEGLUROMET the day before surgery and the day of surgery  Do not take glimepiride (AMARYL) the night before surgery and the day of surgery   HOW TO MANAGE YOUR DIABETES BEFORE AND AFTER SURGERY  Why is it important to control my blood sugar before and after surgery? Improving blood sugar levels before and after surgery helps healing and can limit problems. A way of improving blood sugar control is eating a healthy diet by:  Eating less sugar and carbohydrates  Increasing activity/exercise  Talking with your doctor about reaching your blood sugar goals High blood sugars (greater than 180 mg/dL) can raise your risk of infections and slow your recovery, so you will need  to focus on controlling your diabetes during the weeks before surgery. Make sure that the doctor who takes care of your diabetes knows about your planned surgery including the date and location.  How do I manage my blood sugar before surgery? Check your blood sugar at least 4 times a day, starting 2 days before surgery, to make sure that the level is not too high or low.  Check your blood sugar the morning of your surgery when you wake up and every 2 hours until you get to the Short Stay unit.  If your blood sugar is less than 70 mg/dL, you will need to treat for low blood sugar: Do not take insulin. Treat a low blood sugar (less than 70 mg/dL) with  cup of clear juice (cranberry or apple), 4 glucose tablets, OR glucose gel. Recheck blood sugar in 15 minutes after treatment (to make sure it is greater than 70 mg/dL). If your blood sugar is not greater than 70 mg/dL on recheck, call [phone] for further instructions. Report your blood sugar to the short stay nurse when you get to Short Stay.  If you are admitted to the hospital after surgery: Your blood sugar will be checked by the staff and you will probably be given insulin after surgery (instead of oral diabetes medicines) to make sure you have good blood sugar levels. The goal for blood sugar control after surgery is 80-180 mg/dL.    Do not wear jewelry or makeup Do not wear lotions, powders, perfumes, or deodorant. Do not shave 48 hours prior to surgery.   Do not  bring valuables to the hospital. DO Not wear nail polish, gel polish, artificial nails, or any other type of covering on natural nails including finger and toenails. If patients have artificial nails, gel coating, etc. that need to be removed by a nail salon please have this removed prior to surgery or surgery may need to be canceled/delayed if the surgeon/ anesthesia feels like the patient is unable to be adequately monitored.              [facility] is not responsible  for any belongings or valuables.  Do NOT Smoke (Tobacco/Vaping)  24 hours prior to your procedure If you use a CPAP at night, you may bring your mask for your overnight stay.   Contacts, glasses, dentures or bridgework may not be worn into surgery, please bring cases for these belongings   For patients admitted to the hospital, discharge time will be determined by your treatment team.   Patients discharged the day of surgery will not be allowed to drive home, and someone needs to stay with them for 24 hours.  NO VISITORS WILL BE ALLOWED IN PRE-OP WHERE PATIENTS GET READY FOR SURGERY.  ONLY 1 SUPPORT PERSON MAY BE PRESENT WHILE YOU ARE IN SURGERY.  IF YOU ARE TO BE ADMITTED, ONCE YOU ARE IN YOUR ROOM YOU WILL BE ALLOWED TWO (2) VISITORS.  Minor children may have two parents present. Special consideration for safety and communication needs will be reviewed on a case by case basis.  Special instructions:    Oral Hygiene is also important to reduce your risk of infection.  Remember - BRUSH YOUR TEETH THE MORNING OF SURGERY WITH YOUR REGULAR TOOTHPASTE   [facility]- Preparing For Surgery  Before surgery, you can play an important role. Because skin is not sterile, your skin needs to be as free of germs as possible. You can reduce the number of germs on your skin by washing with CHG (chlorahexidine gluconate) Soap before surgery.  CHG is an antiseptic cleaner which kills germs and bonds with the skin to continue killing germs even after washing.     Please do not use if you have an allergy to CHG or antibacterial soaps. If your skin becomes reddened/irritated stop using the CHG.  Do not shave (including legs and underarms) for at least 48 hours prior to first CHG shower. It is OK to shave your face.  Please follow these instructions carefully.     Shower the NIGHT BEFORE SURGERY and the MORNING OF SURGERY with CHG Soap.   If you chose to wash your hair, wash your hair first as usual with  your normal shampoo. After you shampoo, rinse your hair and body thoroughly to remove the shampoo.  Then [employer] and genitals (private parts) with your normal soap and rinse thoroughly to remove soap.  After that Use CHG Soap as you would any other liquid soap. You can apply CHG directly to the skin and wash gently with a scrungie or a clean washcloth.   Apply the CHG Soap to your body ONLY FROM THE NECK DOWN.  Do not use on open wounds or open sores. Avoid contact with your eyes, ears, mouth and genitals (private parts). Wash Face and genitals (private parts)  with your normal soap.   Wash thoroughly, paying special attention to the area where your surgery will be performed.  Thoroughly rinse your body with warm water from the neck down.  DO NOT shower/wash with your normal soap after using  and rinsing off the CHG Soap.  Pat yourself dry with a CLEAN TOWEL.  Wear CLEAN PAJAMAS to bed the night before surgery  Place CLEAN SHEETS on your bed the night before your surgery  DO NOT SLEEP WITH PETS.   Day of Surgery:  Take a shower with CHG soap. Wear Clean/Comfortable clothing the morning of surgery Do not apply any deodorants/lotions.   Remember to brush your teeth WITH YOUR REGULAR TOOTHPASTE.   Please read over the following fact sheets that you were given.

## 2021-08-18 ENCOUNTER — Ambulatory Visit (HOSPITAL_COMMUNITY)
Admission: RE | Admit: 2021-08-18 | Discharge: 2021-08-18 | Disposition: A | Discharge status: Home / Self Care | Payer: 59 | Source: Ambulatory Visit | Attending: Thoracic Surgery (Cardiothoracic Vascular Surgery) | Admitting: Thoracic Surgery (Cardiothoracic Vascular Surgery)

## 2021-08-18 ENCOUNTER — Encounter (HOSPITAL_COMMUNITY)
Admission: RE | Admit: 2021-08-18 | Discharge: 2021-08-18 | Disposition: A | Discharge status: Home / Self Care | Payer: 59 | Source: Ambulatory Visit | Attending: Thoracic Surgery (Cardiothoracic Vascular Surgery) | Admitting: Thoracic Surgery (Cardiothoracic Vascular Surgery)

## 2021-08-18 ENCOUNTER — Encounter (HOSPITAL_COMMUNITY): Payer: Self-pay

## 2021-08-18 ENCOUNTER — Other Ambulatory Visit: Payer: Self-pay

## 2021-08-18 DIAGNOSIS — R911 Solitary pulmonary nodule: Secondary | ICD-10-CM

## 2021-08-18 DIAGNOSIS — Z01818 Encounter for other preprocedural examination: Secondary | ICD-10-CM | POA: Insufficient documentation

## 2021-08-18 HISTORY — DX: Gastro-esophageal reflux disease without esophagitis: K21.9

## 2021-08-18 HISTORY — DX: Unspecified osteoarthritis, unspecified site: M19.90

## 2021-08-18 HISTORY — DX: Personal history of urinary calculi: Z87.442

## 2021-08-18 HISTORY — DX: Chronic obstructive pulmonary disease, unspecified: J44.9

## 2021-08-18 LAB — COMPREHENSIVE METABOLIC PANEL
ALT: 13 U/L (ref 0–44)
AST: 20 U/L (ref 15–41)
Albumin: 4 g/dL (ref 3.5–5.0)
Alkaline Phosphatase: 82 U/L (ref 38–126)
Anion gap: 10 (ref 5–15)
BUN: 25 mg/dL — ABNORMAL HIGH (ref 8–23)
CO2: 24 mmol/L (ref 22–32)
Calcium: 9.5 mg/dL (ref 8.9–10.3)
Chloride: 104 mmol/L (ref 98–111)
Creatinine, Ser: 1.25 mg/dL — ABNORMAL HIGH (ref 0.44–1.00)
GFR, Estimated: 49 mL/min — ABNORMAL LOW (ref >60)
Glucose, Bld: 167 mg/dL — ABNORMAL HIGH (ref 70–99)
Potassium: 4 mmol/L (ref 3.5–5.1)
Sodium: 138 mmol/L (ref 135–145)
Total Bilirubin: 0.6 mg/dL (ref 0.3–1.2)
Total Protein: 8.3 g/dL — ABNORMAL HIGH (ref 6.5–8.1)

## 2021-08-18 LAB — CBC
HCT: 41.6 % (ref 36.0–46.0)
Hemoglobin: 13.7 g/dL (ref 12.0–15.0)
MCH: 30.4 pg (ref 26.0–34.0)
MCHC: 32.9 g/dL (ref 30.0–36.0)
MCV: 92.4 fL (ref 80.0–100.0)
Platelets: 214 10*3/uL (ref 150–400)
RBC: 4.5 MIL/uL (ref 3.87–5.11)
RDW: 13.2 % (ref 11.5–15.5)
WBC: 8.9 10*3/uL (ref 4.0–10.5)
nRBC: 0 % (ref 0.0–0.2)

## 2021-08-18 LAB — PROTIME-INR
INR: 1 (ref 0.8–1.2)
Prothrombin Time: 13.3 seconds (ref 11.4–15.2)

## 2021-08-18 LAB — HEMOGLOBIN A1C
Hgb A1c MFr Bld: 6.6 % — ABNORMAL HIGH (ref 4.8–5.6)
Mean Plasma Glucose: 142.72 mg/dL

## 2021-08-18 LAB — APTT: aPTT: 40 seconds — ABNORMAL HIGH (ref 24–36)

## 2021-08-18 LAB — GLUCOSE, CAPILLARY: Glucose-Capillary: 153 mg/dL — ABNORMAL HIGH (ref 70–99)

## 2021-08-18 NOTE — Progress Notes (Addendum)
PCP - Vincent Gros, NP Cardiologist - denies  PPM/ICD - denies Device Orders - n/a Rep Notified - n/a  Chest x-ray - 08/18/2021 EKG - 08/18/2021 Stress Test - denies ECHO - 08/2020 - in [place] (normal per patient) Cardiac Cath - denies  Sleep Study - denies CPAP - n/a  Fasting Blood Sugar - patient is not checking CBG at home Checks Blood Sugar 0 times a day CBG today - 153 A1C - done in PAT 08/18/2021  Blood Thinner Instructions:n/a  Aspirin Instructions: Patient was instructed: As of today, STOP taking any Aspirin (unless otherwise instructed by your surgeon) Aleve, Naproxen, Ibuprofen, Motrin, Advil, Goody's, BC's, all herbal medications, fish oil, and all vitamins.  ERAS Protcol - yes PRE-SURGERY Ensure or G2- no  COVID TEST- no - ambulatory surgery  Anesthesia review: yes, APTT abnormal in PAT  Patient denies shortness of breath, fever, cough and chest pain at PAT appointment   All instructions explained to the patient, with a verbal understanding of the material. Patient agrees to go over the instructions while at home for a better understanding. Patient also instructed to self quarantine after being tested for COVID-19. The opportunity to ask questions was provided.

## 2021-08-18 NOTE — Progress Notes (Signed)
Abnormal lab in PAT - APTT 40 - Dr. Dorris Fetch office was notified Alycia Rossetti, [place], [place])

## 2021-08-19 ENCOUNTER — Ambulatory Visit (HOSPITAL_COMMUNITY): Payer: 59 | Admitting: Anesthesiology

## 2021-08-19 ENCOUNTER — Ambulatory Visit (INDEPENDENT_AMBULATORY_CARE_PROVIDER_SITE_OTHER): Payer: 59 | Admitting: Nurse Practitioner

## 2021-08-19 ENCOUNTER — Encounter: Payer: Self-pay | Admitting: Nurse Practitioner

## 2021-08-19 ENCOUNTER — Ambulatory Visit (HOSPITAL_COMMUNITY): Payer: 59 | Admitting: Emergency Medicine

## 2021-08-19 VITALS — BP 135/83 | HR 77 | Temp 98.2°F | Ht 71.0 in | Wt 190.9 lb

## 2021-08-19 DIAGNOSIS — M25512 Pain in left shoulder: Secondary | ICD-10-CM

## 2021-08-19 DIAGNOSIS — Z6826 Body mass index (BMI) 26.0-26.9, adult: Secondary | ICD-10-CM

## 2021-08-19 DIAGNOSIS — G8929 Other chronic pain: Secondary | ICD-10-CM

## 2021-08-19 DIAGNOSIS — Z6827 Body mass index (BMI) 27.0-27.9, adult: Secondary | ICD-10-CM | POA: Insufficient documentation

## 2021-08-19 DIAGNOSIS — E119 Type 2 diabetes mellitus without complications: Secondary | ICD-10-CM | POA: Diagnosis not present

## 2021-08-19 DIAGNOSIS — E785 Hyperlipidemia, unspecified: Secondary | ICD-10-CM

## 2021-08-19 DIAGNOSIS — R911 Solitary pulmonary nodule: Secondary | ICD-10-CM | POA: Diagnosis not present

## 2021-08-19 DIAGNOSIS — E1169 Type 2 diabetes mellitus with other specified complication: Secondary | ICD-10-CM | POA: Diagnosis not present

## 2021-08-19 DIAGNOSIS — I152 Hypertension secondary to endocrine disorders: Secondary | ICD-10-CM

## 2021-08-19 DIAGNOSIS — E1159 Type 2 diabetes mellitus with other circulatory complications: Secondary | ICD-10-CM | POA: Diagnosis not present

## 2021-08-19 NOTE — Progress Notes (Signed)
Established Patient Office Visit  Subjective:  Patient ID: Danielle Hurley, female    DOB: [birth year]  Age: 60 y.o. MRN: [id number]  CC:  Chief Complaint  Patient presents with   Diabetes    HPI Danielle Hurley presents for follow up. She is scheduled to have bronchoscopy in the morning. Has a few pulmonary nodules found. 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  Blood sugars are doing well. Hgba1c was 6.6 yesterday at preoperative visit for bronchoscopy. She states she has noted a knot on the left side of her neck states that it has been there for a while. Not present on the right side. She had ECG done during pre op visit. It was normal. Labs indicated elevated renal functions, but was otherwise normal.  She had been having left shoulder pain. She did see orthopedic provider back in May. She did get cortisone injection which did help. She states that shoulder pain has gradually started to bother her again. She has finished treatment with Maveret for Hepatitis C.  Past Medical History:  Diagnosis Date   Arthritis    COPD (chronic obstructive pulmonary disease) (HCC)    Diabetes mellitus without complication (HCC)    Dyspnea    GERD (gastroesophageal reflux disease)    Hepatitis C    High cholesterol    History of kidney stones    Hypertension    Tuberculosis    non contagious Dx 05/2020    Past Surgical History:  Procedure Laterality Date   CHOLECYSTECTOMY  1998   TUBAL LIGATION  01/1995   VIDEO BRONCHOSCOPY WITH ENDOBRONCHIAL NAVIGATION N/A 09/04/2021   Procedure: VIDEO BRONCHOSCOPY WITH ENDOBRONCHIAL NAVIGATION;  Surgeon: Loreli Slot, MD;  Location: [facility] OR;  Service: Thoracic;  Laterality: N/A;    History reviewed. No pertinent family history.  Social History   Socioeconomic History   Marital status: Widowed    Spouse name: Not on file   Number of children: 4   Years of education: Not on file   Highest education level: 12th grade   Occupational History   Not on file  Tobacco Use   Smoking status: Former    Packs/day: 1.00    Years: 40.00    Pack years: 40.00    Types: Cigarettes    Quit date: 2014    Years since quitting: 8.7   Smokeless tobacco: Never  Vaping Use   Vaping Use: Never used  Substance and Sexual Activity   Alcohol use: Yes    Comment: occasional   Drug use: Not Currently   Sexual activity: Not Currently  Other Topics Concern   Not on file  Social History Narrative   Not on file   Social Determinants of Health   Financial Resource Strain: Not on file  Food Insecurity: Not on file  Transportation Needs: Not on file  Physical Activity: Not on file  Stress: Not on file  Social Connections: Not on file  Intimate Partner Violence: Not on file    Outpatient Medications Prior to Visit  Medication Sig Dispense Refill   amoxicillin-clavulanate (AUGMENTIN) 875-125 MG tablet One twice daily with a glass of water (Patient taking differently: Take 1 tablet by mouth 2 (two) times daily. One twice daily with a glass of water) 60 tablet 0   aspirin-acetaminophen-caffeine (EXCEDRIN MIGRAINE) 250-250-65 MG tablet Take 1 tablet by mouth every 6 (six) hours as needed for headache.     atorvastatin (LIPITOR) 20 MG tablet Take 10 mg  by mouth daily.     Bismuth Subsalicylate (KAOPECTATE) 262 MG TABS Take 262 mg by mouth daily as needed (upset stomach).     calcium carbonate (TUMS - DOSED IN MG ELEMENTAL CALCIUM) 500 MG chewable tablet Chew 1-2 tablets by mouth daily as needed for indigestion or heartburn.     cetirizine (ZYRTEC) 10 MG tablet Take 10 mg by mouth daily as needed for allergies.     Cholecalciferol (VITAMIN D) 50 MCG (2000 UT) tablet Take 2,000 Units by mouth daily.     diphenhydrAMINE (BENADRYL) 2 % cream Apply 1 application topically 3 (three) times daily as needed for itching.     famotidine (PEPCID) 20 MG tablet One after supper (Patient taking differently: Take 20 mg by mouth daily as  needed for heartburn or indigestion.) 30 tablet 11   Glecaprevir-Pibrentasvir (MAVYRET) 100-40 MG TABS Take 3 tablets by mouth daily with breakfast. 84 tablet 1   glimepiride (AMARYL) 4 MG tablet Take 1 tablet (4 mg total) by mouth 2 (two) times daily. 180 tablet 1   guaifenesin (HUMIBID E) 400 MG TABS tablet Take 400 mg by mouth daily.     HYDROcodone-acetaminophen (NORCO/VICODIN) 5-325 MG tablet Take 0.5 tablets by mouth every 6 (six) hours as needed for severe pain.     ibuprofen (ADVIL) 200 MG tablet Take 800 mg by mouth every 8 (eight) hours as needed for mild pain or moderate pain.     mupirocin ointment (BACTROBAN) 2 % Apply small amount to effected area twice daily for 2 weeks then as needed (Patient taking differently: Apply 1 application topically 2 (two) times daily as needed (sore). Apply small amount to effected area twice daily for 2 weeks then as needed) 22 g 1   pantoprazole (PROTONIX) 40 MG tablet Take 40 mg by mouth daily.     rosuvastatin (CRESTOR) 10 MG tablet Take 1 tablet once daily while on Mavyret. (Continue taking atorvastatin until you start Mavyret) 30 tablet 1   SEGLUROMET 7.03-999 MG TABS Take 1 tablet by mouth 2 (two) times daily.     valsartan (DIOVAN) 160 MG tablet Take 160 mg by mouth daily.     No facility-administered medications prior to visit.    No Known Allergies  ROS Review of Systems  Constitutional:  Negative for activity change, appetite change, chills, fatigue and fever.  HENT:  Negative for congestion, postnasal drip, rhinorrhea, sinus pressure, sinus pain, sneezing and sore throat.   Eyes: Negative.   Respiratory:  Positive for cough and wheezing. Negative for chest tightness and shortness of breath.        Well managed   Cardiovascular:  Negative for chest pain and palpitations.  Gastrointestinal:  Negative for abdominal pain, constipation, diarrhea, nausea and vomiting.  Endocrine: Negative for cold intolerance, heat intolerance, polydipsia  and polyuria.       Blood sugars are stable.   Genitourinary:  Negative for dyspareunia, dysuria, flank pain, frequency and urgency.  Musculoskeletal:  Positive for arthralgias and myalgias. Negative for back pain.       Left shoulder pain. Gradually getting worse again.  Skin:  Negative for rash.  Allergic/Immunologic: Negative for environmental allergies.  Neurological:  Positive for headaches. Negative for dizziness and weakness.  Hematological:  Negative for adenopathy.  Psychiatric/Behavioral:  The patient is not nervous/anxious.      Objective:    Physical Exam Vitals and nursing note reviewed.  Constitutional:      Appearance: Normal appearance. She is well-developed.  HENT:  Head: Normocephalic and atraumatic.     Nose: Nose normal.     Mouth/Throat:     Mouth: Mucous membranes are moist.  Eyes:     Extraocular Movements: Extraocular movements intact.     Conjunctiva/sclera: Conjunctivae normal.     Pupils: Pupils are equal, round, and reactive to light.  Cardiovascular:     Rate and Rhythm: Normal rate and regular rhythm.     Pulses: Normal pulses.     Heart sounds: Normal heart sounds.  Pulmonary:     Effort: Pulmonary effort is normal.     Breath sounds: Normal breath sounds.  Abdominal:     Palpations: Abdomen is soft.  Musculoskeletal:        General: Normal range of motion.     Cervical back: Normal range of motion and neck supple.     Comments: Left shoulder tenderness. She does have limited ROM and strength of the left shoulder.   Lymphadenopathy:     Cervical: No cervical adenopathy.  Skin:    General: Skin is warm and dry.     Capillary Refill: Capillary refill takes less than 2 seconds.  Neurological:     General: No focal deficit present.     Mental Status: She is alert and oriented to person, place, and time.  Psychiatric:        Mood and Affect: Mood normal.        Behavior: Behavior normal.        Thought Content: Thought content normal.         Judgment: Judgment normal.   Today's Vitals   08/19/21 1046  BP: 135/83  Pulse: 77  Temp: 98.2 F (36.8 C)  SpO2: 95%  Weight: 190 lb 14.4 oz (86.6 kg)  Height: 5\' 11"  (1.803 m)   Body mass index is 26.63 kg/m.   Wt Readings from Last 3 Encounters:  09/04/21 195 lb (88.5 kg)  09/02/21 194 lb 14.4 oz (88.4 kg)  08/20/21 190 lb (86.2 kg)     Health Maintenance Due  Topic Date Due   COVID-19 Vaccine (1) Never done   FOOT EXAM  Never done   OPHTHALMOLOGY EXAM  Never done   TETANUS/TDAP  Never done   PAP SMEAR-Modifier  Never done   COLONOSCOPY (Pts 45-65yrs Insurance coverage will need to be confirmed)  Never done   MAMMOGRAM  Never done   Zoster Vaccines- Shingrix (1 of 2) Never done   INFLUENZA VACCINE  Never done    There are no preventive care reminders to display for this patient.  No results found for: TSH Lab Results  Component Value Date   WBC 8.9 08/18/2021   HGB 13.7 08/18/2021   HCT 41.6 08/18/2021   MCV 92.4 08/18/2021   PLT 214 08/18/2021   Lab Results  Component Value Date   NA 138 08/18/2021   K 4.0 08/18/2021   CO2 24 08/18/2021   GLUCOSE 167 (H) 08/18/2021   BUN 25 (H) 08/18/2021   CREATININE 1.25 (H) 08/18/2021   BILITOT 0.6 08/18/2021   ALKPHOS 82 08/18/2021   AST 20 08/18/2021   ALT 13 08/18/2021   PROT 8.3 (H) 08/18/2021   ALBUMIN 4.0 08/18/2021   CALCIUM 9.5 08/18/2021   ANIONGAP 10 08/18/2021   No results found for: CHOL No results found for: HDL No results found for: LDLCALC No results found for: TRIG No results found for: [facility] Lab Results  Component Value Date   HGBA1C 6.6 (H) 08/18/2021  Assessment & Plan:  1. Diabetes mellitus without complication (HCC) The patient had hemoglobin A1c drawn yesterday.  It was 6.6.  Continue all diabetic medications as prescribed.  Recheck at next visit in December, 2022.  2. Hypertension associated with diabetes (HCC) Blood pressure stable.  Continue blood pressure  medications as prescribed.  3. Hyperlipidemia associated with type 2 diabetes mellitus (HCC) Will continue rosuvastatin as prescribed.  4. Pulmonary nodule Patient to have bronchoscopy to evaluate pulmonary nodule.  5. Body mass index 26.0-26.9, adult She should consume a 1500-calorie diet.  Diet should be low fat and low cholesterol.  She should gradually incorporate exercise into her daily routine.  6. Chronic left shoulder pain Continuous visits with orthopedic provider as needed and as scheduled.  Problem List Items Addressed This Visit       Cardiovascular and Mediastinum   Hypertension associated with diabetes (HCC)     Endocrine   Diabetes mellitus without complication (HCC) - Primary   Hyperlipidemia associated with type 2 diabetes mellitus (HCC)     Other   Chronic left shoulder pain   Pulmonary nodule   Body mass index 26.0-26.9, adult    This note was dictated using Dragon Voice Recognition Software. Rapid proofreading was performed to expedite the delivery of the information. Despite proofreading, phonetic errors will occur which are common with this voice recognition software. Please take this into consideration. If there are any concerns, please contact our office.     Follow-up: Return in about 3 months (around 11/18/2021) for health maintenance exam, with pap, check HgbA1c - needs to get flu shot in beginning of october .    Carlean Jews, NP

## 2021-08-19 NOTE — Anesthesia Preprocedure Evaluation (Addendum)
Anesthesia Evaluation  Patient identified by MRN, date of birth, ID band Patient awake    Reviewed: Allergy & Precautions, NPO status , Patient's Chart, lab work & pertinent test results  History of Anesthesia Complications Negative for: history of anesthetic complications  Airway Mallampati: I  TM Distance: >3 FB Neck ROM: Full    Dental  (+) Edentulous Upper, Edentulous Lower   Pulmonary former smoker,  H/o TB: treated 04/2021 CXR: No pneumothorax. No pleural effusion. No confluent airspace disease. Rounded opacity of the left upper lobe and overlying the left hilum   breath sounds clear to auscultation       Cardiovascular hypertension, Pt. on medications (-) angina Rhythm:Regular Rate:Normal     Neuro/Psych negative neurological ROS     GI/Hepatic GERD  Medicated and Controlled,(+) Hepatitis -, C  Endo/Other  diabetes (glu 187), Oral Hypoglycemic Agents  Renal/GU Renal InsufficiencyRenal disease     Musculoskeletal  (+) Arthritis ,   Abdominal   Peds  Hematology negative hematology ROS (+)   Anesthesia Other Findings   Reproductive/Obstetrics                           Anesthesia Physical Anesthesia Plan  ASA: 3  Anesthesia Plan: General   Post-op Pain Management:    Induction: Intravenous  PONV Risk Score and Plan: 3 and Ondansetron, Dexamethasone and Scopolamine patch - Pre-op  Airway Management Planned: Oral ETT  Additional Equipment: None  Intra-op Plan:   Post-operative Plan: Extubation in OR  Informed Consent: I have reviewed the patients History and Physical, chart, labs and discussed the procedure including the risks, benefits and alternatives for the proposed anesthesia with the patient or authorized representative who has indicated his/her understanding and acceptance.       Plan Discussed with: CRNA and Surgeon  Anesthesia Plan Comments: (See APP note by  Joslyn Hy, FNP   Addendum: case cancelled by Dr Dorris Fetch: pt COVID pos)     Anesthesia Quick Evaluation

## 2021-08-19 NOTE — Progress Notes (Addendum)
Anesthesia Chart Review:   Case: [id number] Date/Time: 08/20/21 0815   Procedure: VIDEO BRONCHOSCOPY WITH ENDOBRONCHIAL NAVIGATION   Anesthesia type: General   Pre-op diagnosis: MULTIPLE BILATERAL LUNG NODULES   Location: [facility] ROOM 15 / [facility]   Surgeons: Loreli Slot, MD       DISCUSSION: Pt is 60 years old with hx HTN, DM, COPD, chronic hepatitis C, pulmonary tuberculosis (treatment completed June 2022)  VS: BP 124/67   Pulse 83   Temp 37 C (Oral)   Resp 19   Ht 5\' 11"  (1.803 m)   Wt 86.5 kg   SpO2 96%   BMI 26.60 kg/m   PROVIDERS: - PCP is , NP - ID is Carlean Jews, MD - Pulmonologist is Odette Fraction, MD   LABS: Labs reviewed: Acceptable for surgery. (all labs ordered are listed, but only abnormal results are displayed)  Labs Reviewed  GLUCOSE, CAPILLARY - Abnormal; Notable for the following components:      Result Value   Glucose-Capillary 153 (*)    All other components within normal limits  COMPREHENSIVE METABOLIC PANEL - Abnormal; Notable for the following components:   Glucose, Bld 167 (*)    BUN 25 (*)    Creatinine, Ser 1.25 (*)    Total Protein 8.3 (*)    GFR, Estimated 49 (*)    All other components within normal limits  APTT - Abnormal; Notable for the following components:   aPTT 40 (*)    All other components within normal limits  HEMOGLOBIN A1C - Abnormal; Notable for the following components:   Hgb A1c MFr Bld 6.6 (*)    All other components within normal limits  CBC  PROTIME-INR     IMAGES:  CXR 08/18/21:  - No significant change in the appearance of the chest x-ray, without acute cardiopulmonary disease.  CT chest 08/06/21:  1. Somewhat spiculated appearing cavitary lesions of the left upper lobe, left lower lobe, and right upper lobe, generally in keeping with prior radiographic findings. Multilobar distribution would be unusual for tuberculosis, and both other infectious agents and malignancy are substantial  differential considerations. At absolute minimum, recommend additional follow-up CT in 3-6 months to assess for stability or resolution following treatment. 2. Diffuse bilateral bronchial wall thickening, consistent with nonspecific infectious or inflammatory bronchitis.  10/06/21 abdomen complete 04/14/21:  -Coarsened hepatic echotexture raising the question of liver disease. - Post cholecystectomy. - No acute findings in the abdomen with renal cysts   EKG 08/18/21: NSR   CV: N/A  Past Medical History:  Diagnosis Date   Arthritis    COPD (chronic obstructive pulmonary disease) (HCC)    Diabetes mellitus without complication (HCC)    GERD (gastroesophageal reflux disease)    Hepatitis C    High cholesterol    History of kidney stones    Hypertension    Tuberculosis    non contagious Dx 05/2020    Past Surgical History:  Procedure Laterality Date   CHOLECYSTECTOMY  1998   TUBAL LIGATION  01/1995    MEDICATIONS:  amoxicillin-clavulanate (AUGMENTIN) 875-125 MG tablet   aspirin-acetaminophen-caffeine (EXCEDRIN MIGRAINE) 250-250-65 MG tablet   atorvastatin (LIPITOR) 20 MG tablet   Bismuth Subsalicylate (KAOPECTATE) 262 MG TABS   calcium carbonate (TUMS - DOSED IN MG ELEMENTAL CALCIUM) 500 MG chewable tablet   cetirizine (ZYRTEC) 10 MG tablet   Cholecalciferol (VITAMIN D) 50 MCG (2000 UT) tablet   diphenhydrAMINE (BENADRYL) 2 % cream   famotidine (PEPCID) 20  MG tablet   Glecaprevir-Pibrentasvir (MAVYRET) 100-40 MG TABS   glimepiride (AMARYL) 4 MG tablet   guaifenesin (MUCUS RELIEF) 400 MG TABS tablet   HYDROcodone-acetaminophen (NORCO/VICODIN) 5-325 MG tablet   ibuprofen (ADVIL) 200 MG tablet   mupirocin ointment (BACTROBAN) 2 %   pantoprazole (PROTONIX) 40 MG tablet   rosuvastatin (CRESTOR) 10 MG tablet   SEGLUROMET 7.03-999 MG TABS   valsartan (DIOVAN) 160 MG tablet   No current facility-administered medications for this encounter.    If no changes, I anticipate pt can  proceed with surgery as scheduled.   Rica Mast, PhD, FNP-BC [facility]/Anesthesiology Phone: [phone] 08/19/2021 9:09 AM

## 2021-08-19 NOTE — Patient Instructions (Signed)

## 2021-08-20 ENCOUNTER — Encounter: Payer: Self-pay | Admitting: *Deleted

## 2021-08-20 ENCOUNTER — Ambulatory Visit (HOSPITAL_COMMUNITY)
Admission: RE | Admit: 2021-08-20 | Discharge: 2021-08-20 | Disposition: A | Discharge status: Home / Self Care | Payer: 59 | Attending: Thoracic Surgery (Cardiothoracic Vascular Surgery) | Admitting: Thoracic Surgery (Cardiothoracic Vascular Surgery)

## 2021-08-20 ENCOUNTER — Encounter (HOSPITAL_COMMUNITY)
Admission: RE | Disposition: A | Discharge status: Home / Self Care | Payer: Self-pay | Source: Home / Self Care | Attending: Thoracic Surgery (Cardiothoracic Vascular Surgery)

## 2021-08-20 ENCOUNTER — Encounter (HOSPITAL_COMMUNITY): Payer: Self-pay | Admitting: Thoracic Surgery (Cardiothoracic Vascular Surgery)

## 2021-08-20 ENCOUNTER — Other Ambulatory Visit: Payer: Self-pay | Admitting: *Deleted

## 2021-08-20 ENCOUNTER — Other Ambulatory Visit: Payer: Self-pay

## 2021-08-20 DIAGNOSIS — U071 COVID-19: Secondary | ICD-10-CM | POA: Insufficient documentation

## 2021-08-20 DIAGNOSIS — R911 Solitary pulmonary nodule: Secondary | ICD-10-CM | POA: Diagnosis not present

## 2021-08-20 DIAGNOSIS — Z538 Procedure and treatment not carried out for other reasons: Secondary | ICD-10-CM | POA: Insufficient documentation

## 2021-08-20 LAB — GLUCOSE, CAPILLARY: Glucose-Capillary: 187 mg/dL — ABNORMAL HIGH (ref 70–99)

## 2021-08-20 LAB — SARS CORONAVIRUS 2 BY RT PCR (HOSPITAL ORDER, PERFORMED IN ~~LOC~~ HOSPITAL LAB): SARS Coronavirus 2: POSITIVE — AB

## 2021-08-20 SURGERY — VIDEO BRONCHOSCOPY WITH ENDOBRONCHIAL NAVIGATION
Anesthesia: General

## 2021-08-20 MED ORDER — PROPOFOL 10 MG/ML IV BOLUS
INTRAVENOUS | Status: AC
  Filled 2021-08-20: qty 20

## 2021-08-20 MED ORDER — LACTATED RINGERS IV SOLN: INTRAVENOUS | Status: DC

## 2021-08-20 MED ORDER — ONDANSETRON HCL 4 MG/2ML IJ SOLN
INTRAMUSCULAR | Status: AC
  Filled 2021-08-20: qty 2

## 2021-08-20 MED ORDER — SCOPOLAMINE 1 MG/3DAYS TD PT72
1.0000 | MEDICATED_PATCH | TRANSDERMAL | Status: DC
  Administered 2021-08-20: 1.5 mg via TRANSDERMAL
  Filled 2021-08-20: qty 1

## 2021-08-20 MED ORDER — FENTANYL CITRATE (PF) 250 MCG/5ML IJ SOLN
INTRAMUSCULAR | Status: AC
  Filled 2021-08-20: qty 5

## 2021-08-20 MED ORDER — ORAL CARE MOUTH RINSE: 15.0000 mL | Freq: Once | OROMUCOSAL | Status: AC

## 2021-08-20 MED ORDER — MIDAZOLAM HCL 2 MG/2ML IJ SOLN
INTRAMUSCULAR | Status: AC
  Filled 2021-08-20: qty 2

## 2021-08-20 MED ORDER — EPINEPHRINE PF 1 MG/ML IJ SOLN
INTRAMUSCULAR | Status: AC
  Filled 2021-08-20: qty 1

## 2021-08-20 MED ORDER — CHLORHEXIDINE GLUCONATE 0.12 % MT SOLN
15.0000 mL | Freq: Once | OROMUCOSAL | Status: AC
  Administered 2021-08-20: 15 mL via OROMUCOSAL
  Filled 2021-08-20: qty 15

## 2021-08-20 MED ORDER — EPINEPHRINE PF 1 MG/ML IJ SOLN
INTRAMUSCULAR | Status: DC | PRN
  Administered 2021-08-20: 1 mg via ENDOTRACHEOPULMONARY

## 2021-08-20 MED ORDER — 0.9 % SODIUM CHLORIDE (POUR BTL) OPTIME
TOPICAL | Status: DC | PRN
  Administered 2021-08-20: 1000 mL

## 2021-08-20 MED ORDER — PHENYLEPHRINE 40 MCG/ML (10ML) SYRINGE FOR IV PUSH (FOR BLOOD PRESSURE SUPPORT)
PREFILLED_SYRINGE | INTRAVENOUS | Status: AC
  Filled 2021-08-20: qty 10

## 2021-08-20 NOTE — Interval H&P Note (Signed)
History and Physical Interval Note: Planning completed 08/20/2021 8:27 AM  Danielle Hurley  has presented today for surgery, with the diagnosis of MULTIPLE BILATERAL LUNG NODULES.  The various methods of treatment have been discussed with the patient and family. After consideration of risks, benefits and other options for treatment, the patient has consented to  Procedure(s): VIDEO BRONCHOSCOPY WITH ENDOBRONCHIAL NAVIGATION (N/A) as a surgical intervention.  The patient's history has been reviewed, patient examined, no change in status, stable for surgery.  I have reviewed the patient's chart and labs.  Questions were answered to the patient's satisfaction.     Loreli Slot

## 2021-08-20 NOTE — Progress Notes (Signed)
Patient's aware of covid positive results.  Patient states that she currently has no symptoms.  IV removed, site is clean, dry, and intact.

## 2021-08-20 NOTE — Progress Notes (Signed)
      [address]       [place] [address]             [phone]      Notified by lab that COVID test was positive  Obviously cannot proceed with elective bronch in setting of positive COVID  Will reschedule in about 2 weeks  Viviann Spare C. Dorris Fetch, MD [facility] [phone]

## 2021-09-01 NOTE — Progress Notes (Signed)
Surgical Instructions    Your procedure is scheduled on Thursday, October 6th, 2022.   Report [facility] Main Entr[facility] "A" at 06:00 A.M., then check in with the Admitting office.  Call this number if you have problems the morning of surgery:  [phone]   If you have any questions prior to your surgery date call [phone]: Open Monday-Friday 8am-4pm    Remember:  Do not eat after midnight the night before your surgery  You may drink clear liquids until 05:00 the morning of your surgery.   Clear liquids allowed are: Water, Non-Citrus Juices (without pulp), Carbonated Beverages, Clear Tea, Black Coffee ONLY (NO MILK, CREAM OR POWDERED CREAMER of any kind), and Gatorade    Take these medicines the morning of surgery with A SIP OF WATER:  amoxicillin-clavulanate (AUGMENTIN)  atorvastatin (LIPITOR)  guaifenesin (HUMIBID E) pantoprazole (PROTONIX)  rosuvastatin (CRESTOR)   If needed:  cetirizine (ZYRTEC)  famotidine (PEPCID) HYDROcodone-acetaminophen (NORCO/VICODIN)   As of today, STOP taking any Aspirin (unless otherwise instructed by your surgeon) Aleve, Naproxen, Ibuprofen, Motrin, Advil, Goody's, BC's, all herbal medications, fish oil, and all vitamins.   WHAT DO I DO ABOUT MY DIABETES MEDICATION?  Do not take SEGLUROMET the day before surgery and the day of surgery   Do not take glimepiride (AMARYL) the night before surgery and the day of surgery   HOW TO MANAGE YOUR DIABETES BEFORE AND AFTER SURGERY  Why is it important to control my blood sugar before and after surgery? Improving blood sugar levels before and after surgery helps healing and can limit problems. A way of improving blood sugar control is eating a healthy diet by:  Eating less sugar and carbohydrates  Increasing activity/exercise  Talking with your doctor about reaching your blood sugar goals High blood sugars (greater than 180 mg/dL) can raise your risk of infections and slow your recovery, so  you will need to focus on controlling your diabetes during the weeks before surgery. Make sure that the doctor who takes care of your diabetes knows about your planned surgery including the date and location.  How do I manage my blood sugar before surgery? Check your blood sugar at least 4 times a day, starting 2 days before surgery, to make sure that the level is not too high or low.  Check your blood sugar the morning of your surgery when you wake up and every 2 hours until you get to the Short Stay unit.  If your blood sugar is less than 70 mg/dL, you will need to treat for low blood sugar: Do not take insulin. Treat a low blood sugar (less than 70 mg/dL) with  cup of clear juice (cranberry or apple), 4 glucose tablets, OR glucose gel. Recheck blood sugar in 15 minutes after treatment (to make sure it is greater than 70 mg/dL). If your blood sugar is not greater than 70 mg/dL on recheck, call [phone] for further instructions. Report your blood sugar to the short stay nurse when you get to Short Stay.  If you are admitted to the hospital after surgery: Your blood sugar will be checked by the staff and you will probably be given insulin after surgery (instead of oral diabetes medicines) to make sure you have good blood sugar levels. The goal for blood sugar control after surgery is 80-180 mg/dL.    After your COVID test   You are not required to quarantine however you are required to wear a well-fitting mask when you are out and  around people not in your household.  If your mask becomes wet or soiled, replace with a new one.  Wash your hands often with soap and water for 20 seconds or clean your hands with an alcohol-based hand sanitizer that contains at least 60% alcohol.  Do not share personal items.  Notify your provider: if you are in close contact with someone who has COVID  or if you develop a fever of 100.4 or greater, sneezing, cough, sore throat, shortness of breath or  body aches.   The day of surgery:          Do not wear jewelry or makeup Do not wear lotions, powders, perfumes, or deodorant. Do not shave 48 hours prior to surgery.   Do not bring valuables to the hospital. DO Not wear nail polish, gel polish, artificial nails, or any other type of covering on natural nails including finger and toenails. If patients have artificial nails, gel coating, etc. that need to be removed by a nail salon please have this removed prior to surgery or surgery may need to be canceled/delayed if the surgeon/ anesthesia feels like the patient is unable to be adequately monitored.              [facility] is not responsible for any belongings or valuables.  Do NOT Smoke (Tobacco/Vaping)  24 hours prior to your procedure If you use a CPAP at night, you may bring your mask for your overnight stay.   Contacts, glasses, dentures or bridgework may not be worn into surgery, please bring cases for these belongings   For patients admitted to the hospital, discharge time will be determined by your treatment team.   Patients discharged the day of surgery will not be allowed to drive home, and someone needs to stay with them for 24 hours.  NO VISITORS WILL BE ALLOWED IN PRE-OP WHERE PATIENTS ARE PREPPED FOR SURGERY.  ONLY 1 SUPPORT PERSON MAY BE PRESENT IN THE WAITING ROOM WHILE YOU ARE IN SURGERY.  IF YOU ARE TO BE ADMITTED, ONCE YOU ARE IN YOUR ROOM YOU WILL BE ALLOWED TWO (2) VISITORS. 1 (ONE) VISITOR MAY STAY OVERNIGHT BUT MUST ARRIVE TO THE ROOM BY 8pm.  Minor children may have two parents present. Special consideration for safety and communication needs will be reviewed on a case by case basis.  Special instructions:    Oral Hygiene is also important to reduce your risk of infection.  Remember - BRUSH YOUR TEETH THE MORNING OF SURGERY WITH YOUR REGULAR TOOTHPASTE   [facility]- Preparing For Surgery  Before surgery, you can play an important role. Because skin is not  sterile, your skin needs to be as free of germs as possible. You can reduce the number of germs on your skin by washing with CHG (chlorahexidine gluconate) Soap before surgery.  CHG is an antiseptic cleaner which kills germs and bonds with the skin to continue killing germs even after washing.     Please do not use if you have an allergy to CHG or antibacterial soaps. If your skin becomes reddened/irritated stop using the CHG.  Do not shave (including legs and underarms) for at least 48 hours prior to first CHG shower. It is OK to shave your face.  Please follow these instructions carefully.     Shower the NIGHT BEFORE SURGERY and the MORNING OF SURGERY with CHG Soap.   If you chose to wash your hair, wash your hair first as usual with your normal  shampoo. After you shampoo, rinse your hair and body thoroughly to remove the shampoo.  Then [employer] and genitals (private parts) with your normal soap and rinse thoroughly to remove soap.  After that Use CHG Soap as you would any other liquid soap. You can apply CHG directly to the skin and wash gently with a scrungie or a clean washcloth.   Apply the CHG Soap to your body ONLY FROM THE NECK DOWN.  Do not use on open wounds or open sores. Avoid contact with your eyes, ears, mouth and genitals (private parts). Wash Face and genitals (private parts)  with your normal soap.   Wash thoroughly, paying special attention to the area where your surgery will be performed.  Thoroughly rinse your body with warm water from the neck down.  DO NOT shower/wash with your normal soap after using and rinsing off the CHG Soap.  Pat yourself dry with a CLEAN TOWEL.  Wear CLEAN PAJAMAS to bed the night before surgery  Place CLEAN SHEETS on your bed the night before your surgery  DO NOT SLEEP WITH PETS.   Day of Surgery:  Take a shower with CHG soap. Wear Clean/Comfortable clothing the morning of surgery Do not apply any deodorants/lotions.   Remember to  brush your teeth WITH YOUR REGULAR TOOTHPASTE.   Please read over the following fact sheets that you were given.

## 2021-09-02 ENCOUNTER — Other Ambulatory Visit: Payer: Self-pay

## 2021-09-02 ENCOUNTER — Encounter (HOSPITAL_COMMUNITY)
Admission: RE | Admit: 2021-09-02 | Discharge: 2021-09-02 | Disposition: A | Discharge status: Home / Self Care | Payer: 59 | Source: Ambulatory Visit | Attending: Thoracic Surgery (Cardiothoracic Vascular Surgery) | Admitting: Thoracic Surgery (Cardiothoracic Vascular Surgery)

## 2021-09-02 ENCOUNTER — Encounter (HOSPITAL_COMMUNITY): Payer: Self-pay

## 2021-09-02 ENCOUNTER — Ambulatory Visit (HOSPITAL_COMMUNITY)
Admission: RE | Admit: 2021-09-02 | Discharge: 2021-09-02 | Disposition: A | Discharge status: Home / Self Care | Payer: 59 | Source: Ambulatory Visit | Attending: Thoracic Surgery (Cardiothoracic Vascular Surgery) | Admitting: Thoracic Surgery (Cardiothoracic Vascular Surgery)

## 2021-09-02 DIAGNOSIS — R911 Solitary pulmonary nodule: Secondary | ICD-10-CM

## 2021-09-02 HISTORY — DX: Dyspnea, unspecified: R06.00

## 2021-09-02 LAB — SURGICAL PCR SCREEN
MRSA, PCR: NEGATIVE
Staphylococcus aureus: NEGATIVE

## 2021-09-02 LAB — GLUCOSE, CAPILLARY: Glucose-Capillary: 233 mg/dL — ABNORMAL HIGH (ref 70–99)

## 2021-09-02 NOTE — Progress Notes (Signed)
PCP: Vincent Gros, NP Cardiologist: denies  EKG: 08/18/21 CXR: 09/02/21 ECHO: denies Stress Test: denies Cardiac Cath: denies  Fasting Blood Sugar- does not check Checks Blood Sugar_0_ times a day A1C 6.6 on 08/18/21  Covid positive 08/20/21  ASA/Blood Thinner: No  OSA/CPAP: No  Anesthesia Review: No  Patient denies shortness of breath, fever, cough, and chest pain at PAT appointment.  Patient verbalized understanding of instructions provided today at the PAT appointment.  Patient asked to review instructions at home and day of surgery.

## 2021-09-03 NOTE — Anesthesia Preprocedure Evaluation (Addendum)
Anesthesia Evaluation  Patient identified by MRN, date of birth, ID band Patient awake    Reviewed: Allergy & Precautions, NPO status , Patient's Chart, lab work & pertinent test results  History of Anesthesia Complications Negative for: history of anesthetic complications  Airway Mallampati: I  TM Distance: >3 FB Neck ROM: Full    Dental  (+) Edentulous Upper, Edentulous Lower, Dental Advisory Given   Pulmonary shortness of breath, COPD, former smoker,  H/o TB: treated 04/2021 CXR: No pneumothorax. No pleural effusion. No confluent airspace disease. Rounded opacity of the left upper lobe and overlying the left hilum   breath sounds clear to auscultation       Cardiovascular hypertension, Pt. on medications (-) angina Rhythm:Regular Rate:Normal     Neuro/Psych negative neurological ROS     GI/Hepatic GERD  Medicated and Controlled,(+) Hepatitis -, C  Endo/Other  diabetes, Oral Hypoglycemic Agents  Renal/GU Renal InsufficiencyRenal disease     Musculoskeletal  (+) Arthritis ,   Abdominal   Peds  Hematology negative hematology ROS (+)   Anesthesia Other Findings   Reproductive/Obstetrics                            Anesthesia Physical  Anesthesia Plan  ASA: 3  Anesthesia Plan: General   Post-op Pain Management:    Induction: Intravenous  PONV Risk Score and Plan: 3 and Ondansetron, Dexamethasone, Scopolamine patch - Pre-op, Treatment may vary due to age or medical condition and Midazolam  Airway Management Planned: Oral ETT  Additional Equipment: None  Intra-op Plan:   Post-operative Plan: Extubation in OR  Informed Consent: I have reviewed the patients History and Physical, chart, labs and discussed the procedure including the risks, benefits and alternatives for the proposed anesthesia with the patient or authorized representative who has indicated his/her understanding and  acceptance.     Dental advisory given  Plan Discussed with: CRNA  Anesthesia Plan Comments:        Anesthesia Quick Evaluation

## 2021-09-04 ENCOUNTER — Ambulatory Visit (HOSPITAL_COMMUNITY): Payer: 59

## 2021-09-04 ENCOUNTER — Ambulatory Visit (HOSPITAL_COMMUNITY): Payer: 59 | Admitting: Anesthesiology

## 2021-09-04 ENCOUNTER — Ambulatory Visit (HOSPITAL_COMMUNITY)
Admission: RE | Admit: 2021-09-04 | Discharge: 2021-09-04 | Disposition: A | Discharge status: Home / Self Care | Payer: 59 | Attending: Thoracic Surgery (Cardiothoracic Vascular Surgery) | Admitting: Thoracic Surgery (Cardiothoracic Vascular Surgery)

## 2021-09-04 ENCOUNTER — Encounter (HOSPITAL_COMMUNITY)
Admission: RE | Disposition: A | Discharge status: Home / Self Care | Payer: Self-pay | Source: Home / Self Care | Attending: Thoracic Surgery (Cardiothoracic Vascular Surgery)

## 2021-09-04 ENCOUNTER — Other Ambulatory Visit: Payer: Self-pay

## 2021-09-04 DIAGNOSIS — Z8611 Personal history of tuberculosis: Secondary | ICD-10-CM | POA: Insufficient documentation

## 2021-09-04 DIAGNOSIS — Z419 Encounter for procedure for purposes other than remedying health state, unspecified: Secondary | ICD-10-CM

## 2021-09-04 DIAGNOSIS — I10 Essential (primary) hypertension: Secondary | ICD-10-CM | POA: Diagnosis not present

## 2021-09-04 DIAGNOSIS — E7800 Pure hypercholesterolemia, unspecified: Secondary | ICD-10-CM | POA: Insufficient documentation

## 2021-09-04 DIAGNOSIS — R918 Other nonspecific abnormal finding of lung field: Secondary | ICD-10-CM | POA: Insufficient documentation

## 2021-09-04 DIAGNOSIS — R911 Solitary pulmonary nodule: Secondary | ICD-10-CM

## 2021-09-04 DIAGNOSIS — Z792 Long term (current) use of antibiotics: Secondary | ICD-10-CM | POA: Insufficient documentation

## 2021-09-04 DIAGNOSIS — Z79899 Other long term (current) drug therapy: Secondary | ICD-10-CM | POA: Diagnosis not present

## 2021-09-04 DIAGNOSIS — E119 Type 2 diabetes mellitus without complications: Secondary | ICD-10-CM | POA: Diagnosis not present

## 2021-09-04 DIAGNOSIS — Z7984 Long term (current) use of oral hypoglycemic drugs: Secondary | ICD-10-CM | POA: Insufficient documentation

## 2021-09-04 DIAGNOSIS — Z8619 Personal history of other infectious and parasitic diseases: Secondary | ICD-10-CM | POA: Diagnosis not present

## 2021-09-04 DIAGNOSIS — Z7982 Long term (current) use of aspirin: Secondary | ICD-10-CM | POA: Insufficient documentation

## 2021-09-04 DIAGNOSIS — Z87891 Personal history of nicotine dependence: Secondary | ICD-10-CM | POA: Diagnosis not present

## 2021-09-04 HISTORY — PX: VIDEO BRONCHOSCOPY WITH ENDOBRONCHIAL NAVIGATION: SHX6175

## 2021-09-04 LAB — GLUCOSE, CAPILLARY
Glucose-Capillary: 163 mg/dL — ABNORMAL HIGH (ref 70–99)
Glucose-Capillary: 164 mg/dL — ABNORMAL HIGH (ref 70–99)

## 2021-09-04 SURGERY — VIDEO BRONCHOSCOPY WITH ENDOBRONCHIAL NAVIGATION
Anesthesia: General

## 2021-09-04 MED ORDER — 0.9 % SODIUM CHLORIDE (POUR BTL) OPTIME
TOPICAL | Status: DC | PRN
  Administered 2021-09-04: 1000 mL

## 2021-09-04 MED ORDER — LACTATED RINGERS IV SOLN: INTRAVENOUS | Status: DC

## 2021-09-04 MED ORDER — LIDOCAINE 2% (20 MG/ML) 5 ML SYRINGE
INTRAMUSCULAR | Status: DC | PRN
  Administered 2021-09-04: 80 mg via INTRAVENOUS

## 2021-09-04 MED ORDER — PHENYLEPHRINE 40 MCG/ML (10ML) SYRINGE FOR IV PUSH (FOR BLOOD PRESSURE SUPPORT)
PREFILLED_SYRINGE | INTRAVENOUS | Status: DC | PRN
  Administered 2021-09-04 (×3): 80 ug via INTRAVENOUS
  Administered 2021-09-04 (×2): 40 ug via INTRAVENOUS

## 2021-09-04 MED ORDER — FENTANYL CITRATE (PF) 250 MCG/5ML IJ SOLN
INTRAMUSCULAR | Status: DC | PRN
  Administered 2021-09-04: 50 ug via INTRAVENOUS
  Administered 2021-09-04: 100 ug via INTRAVENOUS

## 2021-09-04 MED ORDER — PHENYLEPHRINE 40 MCG/ML (10ML) SYRINGE FOR IV PUSH (FOR BLOOD PRESSURE SUPPORT)
PREFILLED_SYRINGE | INTRAVENOUS | Status: AC
  Filled 2021-09-04: qty 10

## 2021-09-04 MED ORDER — ORAL CARE MOUTH RINSE: 15.0000 mL | Freq: Once | OROMUCOSAL | Status: AC

## 2021-09-04 MED ORDER — FENTANYL CITRATE (PF) 250 MCG/5ML IJ SOLN
INTRAMUSCULAR | Status: AC
  Filled 2021-09-04: qty 5

## 2021-09-04 MED ORDER — ONDANSETRON HCL 4 MG/2ML IJ SOLN
INTRAMUSCULAR | Status: DC | PRN
  Administered 2021-09-04: 4 mg via INTRAVENOUS

## 2021-09-04 MED ORDER — PROMETHAZINE HCL 25 MG/ML IJ SOLN: 6.2500 mg | INTRAMUSCULAR | Status: DC | PRN

## 2021-09-04 MED ORDER — PROPOFOL 10 MG/ML IV BOLUS
INTRAVENOUS | Status: AC
  Filled 2021-09-04: qty 20

## 2021-09-04 MED ORDER — MIDAZOLAM HCL 2 MG/2ML IJ SOLN
INTRAMUSCULAR | Status: AC
  Filled 2021-09-04: qty 2

## 2021-09-04 MED ORDER — MIDAZOLAM HCL 2 MG/2ML IJ SOLN
INTRAMUSCULAR | Status: DC | PRN
  Administered 2021-09-04: 1 mg via INTRAVENOUS

## 2021-09-04 MED ORDER — LIDOCAINE 2% (20 MG/ML) 5 ML SYRINGE
INTRAMUSCULAR | Status: AC
  Filled 2021-09-04: qty 5

## 2021-09-04 MED ORDER — CHLORHEXIDINE GLUCONATE 0.12 % MT SOLN: 15.0000 mL | Freq: Once | OROMUCOSAL | Status: AC

## 2021-09-04 MED ORDER — PROPOFOL 10 MG/ML IV BOLUS
INTRAVENOUS | Status: DC | PRN
  Administered 2021-09-04: 120 mg via INTRAVENOUS

## 2021-09-04 MED ORDER — CHLORHEXIDINE GLUCONATE 0.12 % MT SOLN
OROMUCOSAL | Status: AC
  Administered 2021-09-04: 15 mL via OROMUCOSAL
  Filled 2021-09-04: qty 15

## 2021-09-04 MED ORDER — DEXAMETHASONE SODIUM PHOSPHATE 10 MG/ML IJ SOLN
INTRAMUSCULAR | Status: AC
  Filled 2021-09-04: qty 1

## 2021-09-04 MED ORDER — FENTANYL CITRATE (PF) 100 MCG/2ML IJ SOLN: 25.0000 ug | INTRAMUSCULAR | Status: DC | PRN

## 2021-09-04 MED ORDER — DEXAMETHASONE SODIUM PHOSPHATE 10 MG/ML IJ SOLN
INTRAMUSCULAR | Status: DC | PRN
  Administered 2021-09-04: 4 mg via INTRAVENOUS

## 2021-09-04 MED ORDER — SCOPOLAMINE 1 MG/3DAYS TD PT72
1.0000 | MEDICATED_PATCH | TRANSDERMAL | Status: DC
  Administered 2021-09-04: 1.5 mg via TRANSDERMAL
  Filled 2021-09-04: qty 1

## 2021-09-04 MED ORDER — ROCURONIUM BROMIDE 10 MG/ML (PF) SYRINGE
PREFILLED_SYRINGE | INTRAVENOUS | Status: AC
  Filled 2021-09-04: qty 10

## 2021-09-04 MED ORDER — EPINEPHRINE PF 1 MG/ML IJ SOLN
INTRAMUSCULAR | Status: DC | PRN
  Administered 2021-09-04: 1 mg via ENDOTRACHEOPULMONARY

## 2021-09-04 MED ORDER — MEPERIDINE HCL 25 MG/ML IJ SOLN: 6.2500 mg | INTRAMUSCULAR | Status: DC | PRN

## 2021-09-04 MED ORDER — EPINEPHRINE PF 1 MG/ML IJ SOLN
INTRAMUSCULAR | Status: AC
  Filled 2021-09-04: qty 1

## 2021-09-04 MED ORDER — ONDANSETRON HCL 4 MG/2ML IJ SOLN
INTRAMUSCULAR | Status: AC
  Filled 2021-09-04: qty 2

## 2021-09-04 MED ORDER — ROCURONIUM BROMIDE 10 MG/ML (PF) SYRINGE
PREFILLED_SYRINGE | INTRAVENOUS | Status: DC | PRN
  Administered 2021-09-04: 10 mg via INTRAVENOUS
  Administered 2021-09-04: 20 mg via INTRAVENOUS
  Administered 2021-09-04: 40 mg via INTRAVENOUS

## 2021-09-04 MED ORDER — SUGAMMADEX SODIUM 200 MG/2ML IV SOLN
INTRAVENOUS | Status: DC | PRN
  Administered 2021-09-04 (×2): 100 mg via INTRAVENOUS

## 2021-09-04 SURGICAL SUPPLY — 41 items
ADAPTER BRONCHOSCOPE OLYMPUS (ADAPTER) ×2 IMPLANT
ADAPTER VALVE BIOPSY EBUS (MISCELLANEOUS) IMPLANT
ADPTR VALVE BIOPSY EBUS (MISCELLANEOUS)
BLADE CLIPPER SURG (BLADE) ×2 IMPLANT
BRUSH BIOPSY BRONCH 10 SDTNB (MISCELLANEOUS) IMPLANT
BRUSH SUPERTRAX BIOPSY (INSTRUMENTS) IMPLANT
BRUSH SUPERTRAX NDL-TIP CYTO (INSTRUMENTS) ×2 IMPLANT
CANISTER SUCT 3000ML PPV (MISCELLANEOUS) ×2 IMPLANT
CNTNR URN SCR LID CUP LEK RST (MISCELLANEOUS) ×2 IMPLANT
CONT SPEC 4OZ STRL OR WHT (MISCELLANEOUS) ×2
COVER BACK TABLE 60X90IN (DRAPES) ×2 IMPLANT
FILTER STRAW FLUID ASPIR (MISCELLANEOUS) IMPLANT
FORCEPS BIOP SUPERTRX PREMAR (INSTRUMENTS) IMPLANT
GAUZE SPONGE 4X4 12PLY STRL (GAUZE/BANDAGES/DRESSINGS) ×2 IMPLANT
GLOVE SURG SIGNA 7.5 PF LTX (GLOVE) ×2 IMPLANT
GOWN STRL REUS W/ TWL XL LVL3 (GOWN DISPOSABLE) ×1 IMPLANT
GOWN STRL REUS W/TWL XL LVL3 (GOWN DISPOSABLE) ×1
KIT CLEAN ENDO COMPLIANCE (KITS) ×2 IMPLANT
KIT ILLUMISITE 180 PROCEDURE (KITS) ×2 IMPLANT
KIT ILLUMISITE 90 PROCEDURE (KITS) IMPLANT
KIT TURNOVER KIT B (KITS) ×2 IMPLANT
MARKER SKIN DUAL TIP RULER LAB (MISCELLANEOUS) ×2 IMPLANT
NEEDLE SUPERTRX PREMARK BIOPSY (NEEDLE) ×4 IMPLANT
NS IRRIG 1000ML POUR BTL (IV SOLUTION) ×2 IMPLANT
OIL SILICONE PENTAX (PARTS (SERVICE/REPAIRS)) ×2 IMPLANT
PAD ARMBOARD 7.5X6 YLW CONV (MISCELLANEOUS) ×4 IMPLANT
PATCHES PATIENT (LABEL) ×6 IMPLANT
SYR 20ML ECCENTRIC (SYRINGE) ×2 IMPLANT
SYR 20ML LL LF (SYRINGE) ×2 IMPLANT
SYR 30ML LL (SYRINGE) ×2 IMPLANT
SYR 5ML LL (SYRINGE) ×2 IMPLANT
SYR TB 1ML LUER SLIP (SYRINGE) IMPLANT
TOWEL GREEN STERILE (TOWEL DISPOSABLE) ×2 IMPLANT
TOWEL GREEN STERILE FF (TOWEL DISPOSABLE) ×2 IMPLANT
TRAP SPECIMEN MUCUS 40CC (MISCELLANEOUS) ×2 IMPLANT
TUBE CONNECTING 20X1/4 (TUBING) ×4 IMPLANT
UNDERPAD 30X36 HEAVY ABSORB (UNDERPADS AND DIAPERS) ×2 IMPLANT
VALVE BIOPSY  SINGLE USE (MISCELLANEOUS) ×1
VALVE BIOPSY SINGLE USE (MISCELLANEOUS) ×1 IMPLANT
VALVE SUCTION BRONCHIO DISP (MISCELLANEOUS) ×2 IMPLANT
WATER STERILE IRR 1000ML POUR (IV SOLUTION) ×2 IMPLANT

## 2021-09-04 NOTE — Discharge Instructions (Addendum)
Do not drive or engage in heavy physical activity for 24 hours  You may resume normal activities tomorrow  You may cough up small amounts of blood over the next few days  You may use acetaminophen (Tylenol) if needed for discomfort. You may use an over the counter cough medication if needed.  My office will contact you with follow up information  Call [phone] if you develop chest pain, shortness of breath, fever > 101 F or cough up more than a tablespoon of blood

## 2021-09-04 NOTE — Brief Op Note (Signed)
09/04/2021  10:13 AM  PATIENT:  Danielle Hurley  60 y.o. female  PRE-OPERATIVE DIAGNOSIS:  MULTIPLE BILATERAL LUNG NODULES  POST-OPERATIVE DIAGNOSIS:  MULTIPLE BILATERAL LUNG NODULES  PROCEDURE:  Procedure(s): VIDEO BRONCHOSCOPY WITH ENDOBRONCHIAL NAVIGATION (N/A) Brushings, needle aspirations and transbronchial biopsies  SURGEON:  Surgeon(s) and Role:    * Loreli Slot, MD - Primary  PHYSICIAN ASSISTANT:   ASSISTANTS: none   ANESTHESIA:   general  EBL:  0 mL   BLOOD ADMINISTERED:none  DRAINS: none   LOCAL MEDICATIONS USED:  NONE  SPECIMEN:  Source of Specimen:  LUL and LLL nodules  DISPOSITION OF SPECIMEN:   Path and micro  COUNTS:  NO endoscopic  TOURNIQUET:  * No tourniquets in log *  DICTATION: .Other Dictation: Dictation Number -  PLAN OF CARE: Discharge to home after PACU  PATIENT DISPOSITION:  PACU - hemodynamically stable.   Delay start of Pharmacological VTE agent (>24hrs) due to surgical blood loss or risk of bleeding: not applicable

## 2021-09-04 NOTE — Transfer of Care (Signed)
Immediate Anesthesia Transfer of Care Note  Patient: Danielle Hurley  Procedure(s) Performed: VIDEO BRONCHOSCOPY WITH ENDOBRONCHIAL NAVIGATION  Patient Location: PACU  Anesthesia Type:General  Level of Consciousness: awake, alert  and oriented  Airway & Oxygen Therapy: Patient Spontanous Breathing  Post-op Assessment: Report given to RN and Post -op Vital signs reviewed and stable  Post vital signs: Reviewed and stable  Last Vitals:  Vitals Value Taken Time  BP 105/48 09/04/21 1009  Temp    Pulse 79 09/04/21 1011  Resp 7 09/04/21 1011  SpO2 89 % 09/04/21 1011  Vitals shown include unvalidated device data.  Last Pain:  Vitals:   09/04/21 0648  TempSrc:   PainSc: 0-No pain         Complications: No notable events documented.

## 2021-09-04 NOTE — Interval H&P Note (Signed)
History and Physical Interval Note:  09/04/2021 7:49 AM  Danielle Hurley  has presented today for surgery, with the diagnosis of MULTIPLE BILATERAL LUNG NODULES.  The various methods of treatment have been discussed with the patient and family. After consideration of risks, benefits and other options for treatment, the patient has consented to  Procedure(s): VIDEO BRONCHOSCOPY WITH ENDOBRONCHIAL NAVIGATION (N/A) as a surgical intervention.  The patient's history has been reviewed, patient examined, no change in status, stable for surgery.  I have reviewed the patient's chart and labs.  Questions were answered to the patient's satisfaction.     Loreli Slot

## 2021-09-04 NOTE — Anesthesia Procedure Notes (Signed)
Procedure Name: Intubation Date/Time: 09/04/2021 8:14 AM Performed by: Trinna Post., CRNA Pre-anesthesia Checklist: Patient identified, Emergency Drugs available, Suction available, Patient being monitored and Timeout performed Patient Re-evaluated:Patient Re-evaluated prior to induction Oxygen Delivery Method: Circle system utilized Preoxygenation: Pre-oxygenation with 100% oxygen Induction Type: IV induction Ventilation: Mask ventilation without difficulty Laryngoscope Size: Mac and 3 Grade View: Grade I Tube type: Oral Tube size: 8.5 mm Number of attempts: 1 Airway Equipment and Method: Stylet Placement Confirmation: ETT inserted through vocal cords under direct vision, positive ETCO2 and breath sounds checked- equal and bilateral Secured at: 22 cm Tube secured with: Tape Dental Injury: Teeth and Oropharynx as per pre-operative assessment

## 2021-09-05 ENCOUNTER — Encounter (HOSPITAL_COMMUNITY): Payer: Self-pay | Admitting: Thoracic Surgery (Cardiothoracic Vascular Surgery)

## 2021-09-05 LAB — ACID FAST SMEAR (AFB, MYCOBACTERIA)
Acid Fast Smear: NEGATIVE
Acid Fast Smear: NEGATIVE
Acid Fast Smear: NEGATIVE

## 2021-09-05 LAB — CYTOLOGY - NON PAP

## 2021-09-05 NOTE — Op Note (Signed)
NAMEEVAH, Danielle Hurley MEDICAL RECORD NO: [id number] ACCOUNT NO: [id number] DATE OF BIRTH: December 09, 1960 FACILITY: [facility] LOCATION: [facility] PHYSICIAN: Salvatore Decent. Dorris Fetch, MD  Operative Report   DATE OF PROCEDURE: 09/04/2021  PREOPERATIVE DIAGNOSIS:  Multiple bilateral lung nodules.  POSTOPERATIVE DIAGNOSIS:  Multiple bilateral lung nodules.  PROCEDURE:  Electromagnetic navigational bronchoscopy with brushings, needle aspirations and transbronchial biopsy.  SURGEON:  Salvatore Decent. Dorris Fetch, MD.  ASSISTANT:  None.  ANESTHESIA:  General.  FINDINGS:  Thick secretions.  Normal endobronchial anatomy with no endobronchial lesions. No malignancy on quick prep. Unable to cannulate appropriate subsegmental bronchus for  approaching the right upper lobe lesion.  CLINICAL NOTE:  Danielle Hurley is a 60 year old woman with a history of tobacco abuse and tuberculosis.  She has had a cavitary lesion in the left upper lobe that had progressively filled with fluid.  She was treated with antibiotics.  CT of the chest  showed multiple bilateral cavitary lung lesions.  She was advised to undergo navigational bronchoscopy for biopsies and to obtain cultures.  The indications, risks, benefits, and alternatives were discussed in detail with the patient.  She understood and  accepted the risks and agreed to proceed.  DESCRIPTION OF PROCEDURE:  Planning for the navigational bronchoscopy had been done on the console previously.  Danielle Hurley was brought to the operating room on 09/04/2021.  She had induction of general anesthesia and was intubated.  Sequential compression devices were placed for DVT prophylaxis.  A Bair Hugger was placed for active  warming.  A timeout was performed.  Flexible fiberoptic bronchoscopy was performed via the endotracheal tube. It revealed normal endobronchial anatomy with no  endobronchial lesions to the level of the subsegmental bronchi.  There was abundant thick clear secretions.  The  washings were sent for cultures. Locatable guide for navigation was placed.  Registration was performed.  There was good correlation of the  video and virtual bronchoscopy.  The bronchoscope was directed to the left upper lobe bronchus and the appropriate subsegmental bronchus was cannulated to approach the left upper lobe lesion. Brushings and needle aspirations were performed. While  performing those, there was difficulty with the console and we were unable to reboot.  A second console was brought in and the planning was reperformed.  Additional sampling was performed with needle aspirations and transbronchial biopsies of the left  upper lobe lesion.  While these specimens were being evaluated, the catheter was redirected to the left lower lobe lesion and sampling was performed of that nodule as well.  Again, there was good proximity and alignment with both left-sided nodules.  The quick preps  returned showing benign bronchial cells.  An attempt was made to cannulate the right upper lobe bronchus to sample the right upper lobe lesion.  This was a relatively central lesion, but I was unable to cannulate the appropriate subsegmental bronchus despite multiple attempts.  All sampling was performed with fluoroscopy.  The total fluoroscopy time was 4.6 minutes.  The total dose was 21 milligray.  Specimens were sent for both pathology and AFB and fungal cultures.  The patient then was extubated in the operating room and taken to the Postanesthetic Care Unit in good condition.     PAA D: 09/04/2021 10:20:17 am T: 09/05/2021 12:17:00 am  JOB: [id number]/ [id number]

## 2021-09-05 NOTE — Anesthesia Postprocedure Evaluation (Signed)
Anesthesia Post Note  Patient: Danielle Hurley  Procedure(s) Performed: VIDEO BRONCHOSCOPY WITH ENDOBRONCHIAL NAVIGATION     Patient location during evaluation: PACU Anesthesia Type: General Level of consciousness: sedated and patient cooperative Pain management: pain level controlled Vital Signs Assessment: post-procedure vital signs reviewed and stable Respiratory status: spontaneous breathing Cardiovascular status: stable Anesthetic complications: no   No notable events documented.  Last Vitals:  Vitals:   09/04/21 1055 09/04/21 1110  BP: (!) 114/57 120/60  Pulse: 76 78  Resp: 13 15  Temp:    SpO2: 93% 100%    Last Pain:  Vitals:   09/04/21 1110  TempSrc:   PainSc: 0-No pain                 Lewie Loron

## 2021-09-06 LAB — CULTURE, RESPIRATORY W GRAM STAIN: Culture: NORMAL

## 2021-09-06 LAB — AEROBIC CULTURE W GRAM STAIN (SUPERFICIAL SPECIMEN)
Culture: NO GROWTH
Culture: NO GROWTH
Gram Stain: NONE SEEN
Gram Stain: NONE SEEN

## 2021-09-08 LAB — SURGICAL PATHOLOGY

## 2021-09-09 ENCOUNTER — Ambulatory Visit: Payer: 59 | Admitting: Thoracic Surgery (Cardiothoracic Vascular Surgery)

## 2021-09-09 ENCOUNTER — Other Ambulatory Visit: Payer: Self-pay

## 2021-09-09 VITALS — BP 140/83 | HR 100 | Resp 20 | Ht 71.0 in | Wt 191.0 lb

## 2021-09-09 DIAGNOSIS — R911 Solitary pulmonary nodule: Secondary | ICD-10-CM | POA: Diagnosis not present

## 2021-09-09 LAB — ANAEROBIC CULTURE W GRAM STAIN
Gram Stain: NONE SEEN
Gram Stain: NONE SEEN

## 2021-09-09 NOTE — Progress Notes (Signed)
[address]       [place] [address]             [phone]      HPI: [name] returns for follow-up after recent bronchoscopy  Danielle Hurley is a 60 year old woman with a history of tobacco abuse, tuberculosis, type 2 diabetes, hypertension, hyperlipidemia, and hepatitis C.  She has had a left upper lobe lung nodule for about a year.  She was diagnosed with tuberculosis based on CT-guided needle biopsy.  She was living in [place] at the time.  She was treated with multiple antibiotics.  She moved to [place].  She is now followed by Dr. Elinor Parkinson of infectious disease.  After completing her antitubercular therapy she had follow-up chest x-rays which showed progressive filling of a cavitary lesion in the left upper lobe.  She was treated with a 2-week course of Augmentin initially and then was given an additional 4-week course.  The lesion has remained unchanged.  She was not having any fevers or chills or sweats prior to initiation of antibiotics.  A CT of the chest showed multiple cavitary lesions bilaterally.  I did a navigational bronchoscopy to get biopsies and cultures.  She tolerated the procedure well.  She coughed up a small amount of blood for couple days afterwards but that has resolved.  Past Medical History:  Diagnosis Date   Arthritis    COPD (chronic obstructive pulmonary disease) (HCC)    Diabetes mellitus without complication (HCC)    Dyspnea    GERD (gastroesophageal reflux disease)    Hepatitis C    High cholesterol    History of kidney stones    Hypertension    Tuberculosis    non contagious Dx 05/2020     Current Outpatient Medications  Medication Sig Dispense Refill   aspirin-acetaminophen-caffeine (EXCEDRIN MIGRAINE) 250-250-65 MG tablet Take 1 tablet by mouth every 6 (six) hours as needed for headache.     atorvastatin (LIPITOR) 20 MG tablet Take 10 mg by mouth daily.     Bismuth Subsalicylate (KAOPECTATE) 262 MG TABS Take  262 mg by mouth daily as needed (upset stomach).     calcium carbonate (TUMS - DOSED IN MG ELEMENTAL CALCIUM) 500 MG chewable tablet Chew 1-2 tablets by mouth daily as needed for indigestion or heartburn.     cetirizine (ZYRTEC) 10 MG tablet Take 10 mg by mouth daily as needed for allergies.     Cholecalciferol (VITAMIN D) 50 MCG (2000 UT) tablet Take 2,000 Units by mouth daily.     diphenhydrAMINE (BENADRYL) 2 % cream Apply 1 application topically 3 (three) times daily as needed for itching.     famotidine (PEPCID) 20 MG tablet One after supper (Patient taking differently: Take 20 mg by mouth daily as needed for heartburn or indigestion.) 30 tablet 11   glimepiride (AMARYL) 4 MG tablet Take 1 tablet (4 mg total) by mouth 2 (two) times daily. 180 tablet 1   guaifenesin (HUMIBID E) 400 MG TABS tablet Take 400 mg by mouth daily.     HYDROcodone-acetaminophen (NORCO/VICODIN) 5-325 MG tablet Take 0.5 tablets by mouth every 6 (six) hours as needed for severe pain.     ibuprofen (ADVIL) 200 MG tablet Take 800 mg by mouth every 8 (eight) hours as needed for mild pain or moderate pain.     mupirocin ointment (BACTROBAN) 2 % Apply small amount to effected area twice daily for 2 weeks then as needed (Patient taking  differently: Apply 1 application topically 2 (two) times daily as needed (sore). Apply small amount to effected area twice daily for 2 weeks then as needed) 22 g 1   pantoprazole (PROTONIX) 40 MG tablet Take 40 mg by mouth daily.     SEGLUROMET 7.03-999 MG TABS Take 1 tablet by mouth 2 (two) times daily.     valsartan (DIOVAN) 160 MG tablet Take 160 mg by mouth daily.     amoxicillin-clavulanate (AUGMENTIN) 875-125 MG tablet One twice daily with a glass of water (Patient not taking: Reported on 09/09/2021) 60 tablet 0   Glecaprevir-Pibrentasvir (MAVYRET) 100-40 MG TABS Take 3 tablets by mouth daily with breakfast. (Patient not taking: Reported on 09/09/2021) 84 tablet 1   rosuvastatin (CRESTOR) 10  MG tablet Take 1 tablet once daily while on Mavyret. (Continue taking atorvastatin until you start Mavyret) (Patient not taking: Reported on 09/09/2021) 30 tablet 1   No current facility-administered medications for this visit.    Physical Exam BP 140/83   Pulse 100   Resp 20   Ht 5\' 11"  (1.803 m)   Wt 191 lb (86.6 kg)   SpO2 95% Comment: RA  BMI 26.52 kg/m  60 year old woman in no acute distress  Diagnostic Tests: One of the cultures grew diphtheroids.  The other cultures have been negative.  AFB stains were negative.  Pathology showed acute inflammation and no evidence of malignancy.  Impression: Danielle Hurley is a 60 year old woman with history of tuberculosis who has multiple lung nodules.  She was treated with empiric antibiotics for a cavitary lesion and filled with fluid.  She was treated for total of 6 weeks without any real change in the appearance of the nodule.  I do not see any indication for surgery at this point, especially given that she has multiple lesions in multiple lobes.  She may well end up developing recurrent infections.  If so, we can consider doing a resection.  Repeat AFB stains were negative cultures are still pending.   Plan: Follow-up with Dr. 68 and Dr. Elinor Parkinson I will be happy to see her again if needed.  Sherene Sires, MD [facility] [phone]

## 2021-09-10 ENCOUNTER — Ambulatory Visit (INDEPENDENT_AMBULATORY_CARE_PROVIDER_SITE_OTHER): Payer: 59 | Admitting: Nurse Practitioner

## 2021-09-10 VITALS — BP 119/69 | HR 80 | Temp 98.0°F | Ht 71.0 in | Wt 192.4 lb

## 2021-09-10 DIAGNOSIS — Z23 Encounter for immunization: Secondary | ICD-10-CM | POA: Diagnosis not present

## 2021-09-10 LAB — FUNGAL STAIN REFLEX

## 2021-09-10 LAB — FUNGUS STAIN

## 2021-09-10 NOTE — Progress Notes (Signed)
Patient is here for Influenza vaccine

## 2021-09-25 LAB — CULTURE, FUNGUS WITHOUT SMEAR

## 2021-09-29 ENCOUNTER — Telehealth: Payer: Self-pay | Admitting: Nurse Practitioner

## 2021-09-29 ENCOUNTER — Other Ambulatory Visit: Payer: Self-pay | Admitting: Nurse Practitioner

## 2021-09-29 DIAGNOSIS — E1169 Type 2 diabetes mellitus with other specified complication: Secondary | ICD-10-CM

## 2021-09-29 MED ORDER — ATORVASTATIN CALCIUM 20 MG PO TABS: 20.0000 mg | ORAL_TABLET | Freq: Every day | ORAL | 0 refills | Status: DC

## 2021-09-29 NOTE — Telephone Encounter (Signed)
Patient is requesting refill of Atorvastatin 20mg . Our office has not refilled this med before. AS, CMA

## 2021-09-29 NOTE — Telephone Encounter (Signed)
That's fine. I renewed it and sent it to [facility] on [address].

## 2021-10-07 ENCOUNTER — Other Ambulatory Visit: Payer: Self-pay | Admitting: Nurse Practitioner

## 2021-10-07 DIAGNOSIS — Z1231 Encounter for screening mammogram for malignant neoplasm of breast: Secondary | ICD-10-CM

## 2021-10-07 MED ORDER — PANTOPRAZOLE SODIUM 40 MG PO TBEC: 40.0000 mg | DELAYED_RELEASE_TABLET | Freq: Every day | ORAL | 0 refills | Status: DC

## 2021-10-07 NOTE — Telephone Encounter (Signed)
Patient calling requesting refill of Pantoprazole. Last refilled by MD in [place]. Please approve if deemed appropriate. AS, CMA   Also, patient requesting MM to screen for breast cancer. AS,CMA

## 2021-10-20 ENCOUNTER — Telehealth: Payer: Self-pay | Admitting: Nurse Practitioner

## 2021-10-20 ENCOUNTER — Other Ambulatory Visit: Payer: Self-pay

## 2021-10-20 DIAGNOSIS — E119 Type 2 diabetes mellitus without complications: Secondary | ICD-10-CM

## 2021-10-20 MED ORDER — GLIMEPIRIDE 4 MG PO TABS: 4.0000 mg | ORAL_TABLET | Freq: Two times a day (BID) | ORAL | 1 refills | Status: DC

## 2021-10-20 MED ORDER — SEGLUROMET 7.5-1000 MG PO TABS
1.0000 | ORAL_TABLET | Freq: Two times a day (BID) | ORAL | 1 refills | Status: DC
Start: 2021-10-20 — End: 2022-01-05

## 2021-10-20 NOTE — Telephone Encounter (Signed)
Patient called requesting refills on Glimepiride 4 mg and Segluromet 7.03-999 mg. [id number]

## 2021-10-21 ENCOUNTER — Other Ambulatory Visit: Payer: Self-pay

## 2021-10-21 ENCOUNTER — Encounter: Payer: Self-pay | Admitting: Infectious Diseases

## 2021-10-21 ENCOUNTER — Ambulatory Visit (INDEPENDENT_AMBULATORY_CARE_PROVIDER_SITE_OTHER): Payer: 59 | Admitting: Infectious Diseases

## 2021-10-21 VITALS — BP 140/82 | HR 81 | Temp 97.7°F | Wt 197.0 lb

## 2021-10-21 DIAGNOSIS — K7460 Unspecified cirrhosis of liver: Secondary | ICD-10-CM

## 2021-10-21 DIAGNOSIS — B182 Chronic viral hepatitis C: Secondary | ICD-10-CM

## 2021-10-21 DIAGNOSIS — Z7185 Encounter for immunization safety counseling: Secondary | ICD-10-CM | POA: Insufficient documentation

## 2021-10-21 DIAGNOSIS — A150 Tuberculosis of lung: Secondary | ICD-10-CM | POA: Diagnosis not present

## 2021-10-21 LAB — ACID FAST CULTURE WITH REFLEXED SENSITIVITIES (MYCOBACTERIA)
Acid Fast Culture: NEGATIVE
Acid Fast Culture: NEGATIVE
Acid Fast Culture: NEGATIVE

## 2021-10-21 NOTE — Progress Notes (Signed)
[facility]                                                             [place], [place], [place], [address]                                                                  Phn. 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 No granulomas and malignancy on path. Cultures- fungal stain and cx no growth in 21 days, Diptheroids+, AFB smear negative, AFB cx no growth to date  - Follows with Pulmonary     # Immunization counseling : Immune to Hepatitis A, s/p 2 doses of hep B, s/p Flu vaccine  # HCC screening - will plan for US abdomen every 6 months   Plan HCV RNA today to check for SVR US abdomen w elastography  Follow up with Pulmonary as instructed for COPD Follow up with [place] in 5-6 months   All questions and concerns were discussed and addressed. Patient verbalized understanding of the plan. ____________________________________________________________________________________________________________________  HPI: 60 year old female with PMH of DM, HTN, HLD, TB on tx with [facility] who is referred for evaluation and management of Hepatitis C. She was diagnosed with Hepatitis C in 2008 but she did not find it out until 2014 when her girlfriend saw it on on of her medical records. 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 Her ex-husband also had Hepatitis C ( unsure if it was  treated). Denies h/o blood transfusion, has tattoos in her body. Denies any personal or family h/o liver diseases or Liver cancer. Denies tx to date. She is interested for Hepatitis C treatment.   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 She was on RIPE tx since feb 2021. She says she was on home isolation until October 2021 and was released from isolation precautions at that time. She then moved to [facility] in October 2021 and continued  to follow with [facility] for her TB care. She has been currently taking Isoniazid/Pyridoxine and Rifampin. 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 She has been tolerating her ATT well without any issues.   She complains of diarrhea for few years. She says she has loose stool at least three times a week. Denies any hematochezia, pain or burning. Denies nausea/vomiting. Abdominal pain is occasiona. Denies cough, chest pain and SOB. Denies GU symptoms. Denies rashes or joint complaints.  Denies recent hospital admission for liver diseases.  She is seeing an Orthopedics for tear in her rt arm and also has been referred to see a pulomonologist.   Social - says she is adopted, Quit smoking 8 years ago, alcohol once in a while. 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 Has not been sexually active for 8 years. She lives with her brother who is deaf and takes care of her. She has 1 adult son and 3 adult daughters.  05/13/21 Here for follow-up for hepatitis C. denies any complaints.  She has been off pulmonary TB treatment since May 03, 2021 and was told that she has completed treatment for pulmonary TB.  She does not have a follow-up with [facility] department anymore.  She was seen by pulmonary for evaluation of her chronic cough Her recent chest x-ray Showed  persistent areas of nodularity and cavitation in the left lung with air-fluid level suggesting superinfection of the pre-existing cavity.  It was recommended to repeat sputum AFB culture off treatment and possible need of FOB for localized bronchiectasis with likelihood of reinfection. she denies any fevers, chills or night sweats.  Denies any chest pain or shortness of breath.  Appetite is good and has gained 4 pounds in the last few months.   Discussed with her regarding her past hepatitis C labs and ultrasound abdomen findings.  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  She is not immune to hepatitis B and will vaccinate her against hepatitis B. I will also check for serology for hepatitis A. she drinks wine occasionally.  Denies smoking and using other illicit drugs.  06/13/21 Taking Mavyret 3 tablets a day without missing any doses. She is on her 4th week . Denies any side effects with the mavyret. She has cough and it is productive of clear sputum. She has been using Mucinex DM which has been helping with the cough. Denies any chest pain and SOB. Appetite is good and thinks she has gained weight. Her last sputum AFB cx from 6/27 ha sno growth so far. She is also closely following with Pulmonary. Discussed with her to collect one more sputum AFB cx and follow up in 4-6 weeks at the end of her hep C tx.   07/22/21 Here for end of treatment follow up for Hepatitis C. She completed her tx with Mavyret for 8 weeks, last dose on 07/17/21. Denies missing any doses. She will be switching Rosuvastatin back to Atorvastatin as she has completed her Mavyret course. Discussed about her [place] screening with [place] every 6 months or so given her fibrosis score of F3.  Patient is also being followed by Dr Melvyn Novas for left upper lobe cavitary lesion which seems to have been present at the time of end of tx for pulmonary TB ( as per Chest xray done on 5/27/2). She  has completed 2 weeks course of Augmentin on 8/15 and had  a chest Xray done on 8/16 which showed Left upper lobe cavity with increased air-fluid level compared to most recent prior, concerning for worsening superimposed infection.  She denies any fevers, chills, sweats. Continues to cough, mostly dry and sometimes with minimal white phlegm. She can walk one quarter of a mile without getting SOB. She does not like to take stairs as it makes her SOB. 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 Denies any nausea, vomiting, abdominal pain and diarrhea. Appetite is good and denies weight loss.   10/21/21 Here for follow up for Hepatitis C. She has completed 8 weeks course of Mavyret, denies missed doses. She was seen by CT surgery Dr Roxan Hockey recently and had (09/04/21) Electromagnetic navigational bronchoscopy with brushings, needle aspirations and transbronchial biopsy. No granulomas and malignancy on path. Cultures are negative so far. After the bronch, she had 2-3 times where she coughed up bloody streak in her phlegm ( last one 3 days ago). She coughs mostly at night and produces minimal whitish phlegm. Denies any purulence. Denies any fevers, chills, sweats and chest pain. Appetite is good and tells me she has gained almost 33lbs of weight. She is considering to start exercising. She is able to do her household activities without any SOB. She is able to climb stairs without any issues and walks around her home which is 1/5th of a mile on a daily bases. She has received flu vaccine.  ROS: 12 point ROS done with pertinent positives and negative as listed above   Past Medical History:  Diagnosis Date   Arthritis    COPD (chronic obstructive pulmonary disease) (HCC)    Diabetes mellitus without complication (HCC)    Dyspnea    GERD (gastroesophageal reflux disease)    Hepatitis C    High cholesterol    History of kidney  stones    Hypertension    Tuberculosis    non contagious Dx 05/2020    Past Surgical History:  Procedure Laterality Date   CHOLECYSTECTOMY  1998   TUBAL LIGATION  01/1995   VIDEO BRONCHOSCOPY WITH ENDOBRONCHIAL NAVIGATION N/A 09/04/2021   Procedure: VIDEO BRONCHOSCOPY WITH ENDOBRONCHIAL NAVIGATION;  Surgeon: Melrose Nakayama, MD;  Location: [facility] OR;  Service: Thoracic;  Laterality: N/A;   Current Outpatient Medications on File Prior to Visit  Medication Sig Dispense Refill   aspirin-acetaminophen-caffeine (EXCEDRIN MIGRAINE) 250-250-65 MG tablet Take 1 tablet by mouth every 6 (six) hours as needed for headache.     atorvastatin (LIPITOR) 20 MG tablet Take 1 tablet (20 mg total) by mouth daily. 90 tablet 0   Bismuth Subsalicylate (KAOPECTATE) 262 MG TABS Take 262 mg by mouth daily as needed (upset stomach).     calcium carbonate (TUMS - DOSED IN MG ELEMENTAL CALCIUM) 500 MG chewable tablet Chew 1-2 tablets by mouth daily as needed for indigestion or heartburn.     cetirizine (ZYRTEC) 10 MG tablet Take 10 mg by mouth daily as needed for allergies.     Cholecalciferol (VITAMIN D) 50 MCG (2000 UT) tablet Take 2,000 Units by mouth daily.     diphenhydrAMINE (BENADRYL) 2 % cream Apply 1 application topically 3 (three) times daily as needed for itching.     famotidine (PEPCID) 20 MG tablet One after supper (Patient taking differently: Take 20 mg by mouth daily as needed for heartburn or indigestion.) 30 tablet 11   glimepiride (AMARYL) 4 MG tablet  Take 1 tablet (4 mg total) by mouth 2 (two) times daily. 180 tablet 1   guaifenesin (HUMIBID E) 400 MG TABS tablet Take 400 mg by mouth daily.     HYDROcodone-acetaminophen (NORCO/VICODIN) 5-325 MG tablet Take 0.5 tablets by mouth every 6 (six) hours as needed for severe pain.     ibuprofen (ADVIL) 200 MG tablet Take 800 mg by mouth every 8 (eight) hours as needed for mild pain or moderate pain.     mupirocin ointment (BACTROBAN) 2 % Apply small amount  to effected area twice daily for 2 weeks then as needed (Patient taking differently: Apply 1 application topically 2 (two) times daily as needed (sore). Apply small amount to effected area twice daily for 2 weeks then as needed) 22 g 1   pantoprazole (PROTONIX) 40 MG tablet Take 1 tablet (40 mg total) by mouth daily. 90 tablet 0   SEGLUROMET 7.03-999 MG TABS Take 1 tablet by mouth 2 (two) times daily. 60 tablet 1   valsartan (DIOVAN) 160 MG tablet Take 160 mg by mouth daily.     No current facility-administered medications on file prior to visit.    No Known Allergies  Social History   Socioeconomic History   Marital status: Widowed    Spouse name: Not on file   Number of children: 4   Years of education: Not on file   Highest education level: 12th grade  Occupational History   Not on file  Tobacco Use   Smoking status: Former    Packs/day: 1.00    Years: 40.00    Pack years: 40.00    Types: Cigarettes    Quit date: 2014    Years since quitting: 8.8   Smokeless tobacco: Never  Vaping Use   Vaping Use: Never used  Substance and Sexual Activity   Alcohol use: Yes    Comment: occasional   Drug use: Not Currently   Sexual activity: Not Currently  Other Topics Concern   Not on file  Social History Narrative   Not on file   Social Determinants of Health   Financial Resource Strain: Not on file  Food Insecurity: Not on file  Transportation Needs: Not on file  Physical Activity: Not on file  Stress: Not on file  Social Connections: Not on file  Intimate Partner Violence: Not on file    Vitals BP 140/82   Pulse 81   Temp 97.7 F (36.5 C) (Oral)   Wt 197 lb (89.4 kg)   SpO2 96%   BMI 27.48 kg/m     Examination  General - not in acute distress, comfortably sitting in chair HEENT - PEERLA, no pallor and no icterus Chest - b/l clear air entry, no additional sounds CVS- Normal s1s2, RRR Abdomen - Soft, Non tender , non distended Ext- no pedal edema Neuro:  grossly normal Psych : calm and cooperative   Pertinent Lab CBC Latest Ref Rng & Units 08/18/2021 04/08/2021  WBC 4.0 - 10.5 K/uL 8.9 7.3  Hemoglobin 12.0 - 15.0 g/dL 13.7 13.2  Hematocrit 36.0 - 46.0 % 41.6 39.4  Platelets 150 - 400 K/uL 214 201   CMP Latest Ref Rng & Units 08/18/2021 07/22/2021 05/13/2021  Glucose 70 - 99 mg/dL 167(H) - -  BUN 8 - 23 mg/dL 25(H) - -  Creatinine 0.44 - 1.00 mg/dL 1.25(H) - -  Sodium 135 - 145 mmol/L 138 - -  Potassium 3.5 - 5.1 mmol/L 4.0 - -  Chloride 98 - 111  mmol/L 104 - -  CO2 22 - 32 mmol/L 24 - -  Calcium 8.9 - 10.3 mg/dL 9.5 - -  Total Protein 6.5 - 8.1 g/dL 8.3(H) - 7.5  Total Bilirubin 0.3 - 1.2 mg/dL 0.6 - 0.4  Alkaline Phos 38 - 126 U/L 82 - -  AST 15 - 41 U/L 20 - 32  ALT 0 - 44 U/L 13 21 33(H)    Pertinent Microbiology  Results for orders placed or performed during the hospital encounter of 09/04/21  Culture, fungus without smear     Status: None   Collection Time: 09/04/21  6:25 AM   Specimen: Lung, Left Upper Lobe  Result Value Ref Range Status   Specimen Description TISSUE  Final   Special Requests  LEFT LUNG UPPER  Final   Culture   Final    NO FUNGUS ISOLATED AFTER 21 DAYS Performed at [facility], [place] [address], [place], [place] [address]    Report Status 09/25/2021 FINAL  Final  Aerobic Culture w Gram Stain (superficial specimen)     Status: None   Collection Time: 09/04/21  6:25 AM   Specimen: Lung, Left Upper Lobe  Result Value Ref Range Status   Specimen Description TISSUE  Final   Special Requests  LEFT LUNG UPPER  Final   Gram Stain NO WBC SEEN NO ORGANISMS SEEN   Final   Culture   Final    NO GROWTH 2 DAYS Performed at [facility], [place] [address] [place], [place] [address]    Report Status 09/06/2021 FINAL  Final  Acid Fast Smear (AFB)     Status: None   Collection Time: 09/04/21  6:25 AM   Specimen: Lung, Left Upper Lobe  Result Value Ref Range Status   AFB Specimen Processing Comment   Final    Comment: Tissue Grinding and Digestion/Decontamination   Acid Fast Smear Negative  Final    Comment: (NOTE) Performed At: [facility] [place], [place] [id number] Rush Farmer MD BC:[id number]    Source (AFB)  LEFT LUNG UPPER  Final    Comment: Performed at [facility], [place] [address], [place], [place] [address]  Anaerobic culture w Gram Stain     Status: None   Collection Time: 09/04/21  6:25 AM   Specimen: Lung, Left Upper Lobe  Result Value Ref Range Status   Specimen Description TISSUE  Final   Special Requests  LEFT LUNG UPPER  Final   Gram Stain NO WBC SEEN NO ORGANISMS SEEN   Final   Culture   Final    NO ANAEROBES ISOLATED Performed at [facility], [place] [address], [place], [place] [address]    Report Status 09/09/2021 FINAL  Final  Aerobic Culture w Gram Stain (superficial specimen)     Status: None   Collection Time: 09/04/21  6:25 AM   Specimen: Lung, Left Lower Lobe  Result Value Ref Range Status   Specimen Description TISSUE  Final   Special Requests  LEFT LOWER LUNG  Final   Gram Stain NO WBC SEEN NO ORGANISMS SEEN   Final   Culture   Final    NO GROWTH 2 DAYS Performed at [facility], [place] [address], [place], [place] [address]    Report Status 09/06/2021 FINAL  Final  Acid Fast Smear (AFB)     Status: None   Collection Time: 09/04/21  6:25 AM   Specimen: Lung, Left  Lower Lobe  Result Value Ref Range Status   AFB Specimen Processing Comment  Final    Comment: Tissue Grinding and Digestion/Decontamination   Acid Fast Smear Negative  Final    Comment: (NOTE) Performed At: [facility] [place], [place] [id number] Rush Farmer MD ID:[id number]    Source (AFB)  LEFT LOWER LUNG  Final    Comment: Performed at [facility], [place] [address] [place], [place] [address]  Anaerobic culture w Gram Stain     Status: None   Collection Time: 09/04/21  6:25 AM   Specimen: Lung, Left  Lower Lobe  Result Value Ref Range Status   Specimen Description TISSUE  Final   Special Requests  LEFT LOWER LUNG  Final   Gram Stain NO WBC SEEN NO ORGANISMS SEEN   Final   Culture   Final    RARE DIPHTHEROIDS(CORYNEBACTERIUM SPECIES) Standardized susceptibility testing for this organism is not available. NO ANAEROBES ISOLATED Performed at [facility], [place] [address], [place], [place] [address]    Report Status 09/09/2021 FINAL  Final  Culture, Respiratory w Gram Stain     Status: None   Collection Time: 09/04/21  6:25 AM   Specimen: Bronchial Alveolar Lavage; Respiratory  Result Value Ref Range Status   Specimen Description BRONCHIAL ALVEOLAR LAVAGE  Final   Special Requests NONE  Final   Gram Stain   Final    MODERATE WBC PRESENT,BOTH PMN AND MONONUCLEAR NO ORGANISMS SEEN    Culture   Final    RARE Normal respiratory flora-no Staph aureus or Pseudomonas seen Performed at [facility], [address] [place], [place] [address]    Report Status 09/06/2021 FINAL  Final  Fungus Stain     Status: None   Collection Time: 09/04/21  6:25 AM   Specimen: Bronchial Alveolar Lavage; Respiratory  Result Value Ref Range Status   FUNGUS STAIN Final report  Final    Comment: (NOTE) Performed At: [facility] [address] [place], [place] [id number] Rush Farmer MD PY:[id number]    Fungal Source BRONCHIAL ALVEOLAR LAVAGE  Final    Comment: Performed at [facility], [place] [address] [place], [place] [address]  Acid Fast Smear (AFB)     Status: None   Collection Time: 09/04/21  6:25 AM   Specimen: Bronchial Alveolar Lavage; Respiratory  Result Value Ref Range Status   AFB Specimen Processing Concentration  Final   Acid Fast Smear Negative  Final    Comment: (NOTE) Performed At: [facility] [place], [place] [id number] Rush Farmer MD WI:[id number]    Source (AFB)  BAL  Final    Comment: Performed at [place], [place] [address], [place], [place] [address]  Fungal Stain reflex     Status: None   Collection Time: 09/04/21  6:25 AM  Result Value Ref Range Status   Fungal stain result 1 Comment  Final    Comment: (NOTE) KOH/Calcofluor preparation:  no fungus observed. Performed At: [facility] [place], [place] [id number] Rush Farmer MD LG:[id number]       Pertinent Pathology  FINAL MICROSCOPIC DIAGNOSIS:   A. LUNG, LEFT UPPER LOBE, BIOPSY:  - Lung tissue with infiltrate showing crush artifact.  - See comment.   B. LUNG, LEFT LOWER LOBE, BIOPSY:  - Benign lung tissue.  - No granulomas or malignancy.   Pertinent Imaging All pertinent labs/Imagings/notes reviewed. All pertinent  plain films and CT images have been personally visualized and interpreted; radiology reports have been reviewed. Decision making incorporated into the Impression / Recommendations.  Chest Xray 09/02/21 IMPRESSION: 1. No acute cardiopulmonary disease. 2. Lung masses and nodules stable compared to the recent prior exams.  Chest Xray 08/18/21 IMPRESSION: No significant change in the appearance of the chest x-ray, without acute cardiopulmonary disease.  US abdomen w elastography 04/14/21   IMPRESSION: ULTRASOUND ABDOMEN:   Coarsened hepatic echotexture raising the question of liver disease.   Post cholecystectomy.   No acute findings in the abdomen with renal cysts.   ULTRASOUND HEPATIC ELASTOGRAPHY:   Median kPa:  6.0   Diagnostic category: < or = 9 kPa: in the absence of other known clinical signs, rules out cACLD IQR to median kilopascals ratio at 0.3 which may indicate reduced accuracy of current data. Sonographic window is challenging based on assessment of submitted images and more than 1 site was assessed. Best data was reported.   I have spent 35  minutes for this patient encounter including  review of prior medical records with greater than 50% of time in face to face  counsel of the patient/discussing diagnostics and plan of care.   Electronically signed by:  Rosiland Oz, MD Infectious Disease Physician [facility] [address] [place], [place] [address] Phone: [phone]  Fax: [phone]

## 2021-10-23 LAB — HEPATITIS C RNA QUANTITATIVE
HCV Quantitative Log: <1.18 log IU/mL
HCV RNA, PCR, QN: <15 IU/mL

## 2021-10-27 ENCOUNTER — Ambulatory Visit (INDEPENDENT_AMBULATORY_CARE_PROVIDER_SITE_OTHER): Payer: 59 | Admitting: Nurse Practitioner

## 2021-10-27 ENCOUNTER — Encounter: Payer: Self-pay | Admitting: Nurse Practitioner

## 2021-10-27 ENCOUNTER — Other Ambulatory Visit: Payer: Self-pay

## 2021-10-27 VITALS — BP 122/73 | HR 73 | Temp 98.2°F | Ht 71.0 in | Wt 197.0 lb

## 2021-10-27 DIAGNOSIS — B3731 Acute candidiasis of vulva and vagina: Secondary | ICD-10-CM

## 2021-10-27 DIAGNOSIS — E119 Type 2 diabetes mellitus without complications: Secondary | ICD-10-CM

## 2021-10-27 DIAGNOSIS — Z6827 Body mass index (BMI) 27.0-27.9, adult: Secondary | ICD-10-CM | POA: Diagnosis not present

## 2021-10-27 DIAGNOSIS — N3281 Overactive bladder: Secondary | ICD-10-CM

## 2021-10-27 MED ORDER — OXYBUTYNIN CHLORIDE ER 5 MG PO TB24: 5.0000 mg | ORAL_TABLET | Freq: Every day | ORAL | 1 refills | Status: DC

## 2021-10-27 MED ORDER — FLUCONAZOLE 150 MG PO TABS: ORAL_TABLET | ORAL | 0 refills | Status: DC

## 2021-10-27 NOTE — Progress Notes (Signed)
Established Patient Office Visit  Subjective:  Patient ID: Danielle Hurley, female    DOB: 1961-07-14  Age: 60 y.o. MRN: [id number]  CC:  Chief Complaint  Patient presents with   bladder    HPI Danielle Hurley presents for evaluation of bladder issues. She is describing symptoms which coincide with overactive bladder. She states that she has to wear a panti liner at all times because she always has a trickle of urine coming out. Unable to hold urine in the bladder. She states that due to overactive bladder, she gets frequent urinary tract infections and yeast infections.  The patient states that she has been having some vaginal itching and irritation as a result of the constant urinary leakage. She denies nausea or vomiting. Would like to have a new prescription for glucose test strips.   Past Medical History:  Diagnosis Date   Arthritis    COPD (chronic obstructive pulmonary disease) (HCC)    Diabetes mellitus without complication (HCC)    Dyspnea    GERD (gastroesophageal reflux disease)    Hepatitis C    High cholesterol    History of kidney stones    Hypertension    Tuberculosis    non contagious Dx 05/2020    Past Surgical History:  Procedure Laterality Date   CHOLECYSTECTOMY  1998   TUBAL LIGATION  01/1995   VIDEO BRONCHOSCOPY WITH ENDOBRONCHIAL NAVIGATION N/A 09/04/2021   Procedure: VIDEO BRONCHOSCOPY WITH ENDOBRONCHIAL NAVIGATION;  Surgeon: Loreli Slot, MD;  Location: [facility] OR;  Service: Thoracic;  Laterality: N/A;    History reviewed. No pertinent family history.  Social History   Socioeconomic History   Marital status: Widowed    Spouse name: Not on file   Number of children: 4   Years of education: Not on file   Highest education level: 12th grade  Occupational History   Not on file  Tobacco Use   Smoking status: Former    Packs/day: 1.00    Years: 40.00    Pack years: 40.00    Types: Cigarettes    Quit date: 2014    Years since quitting: 8.9    Smokeless tobacco: Never  Vaping Use   Vaping Use: Never used  Substance and Sexual Activity   Alcohol use: Yes    Comment: occasional   Drug use: Not Currently   Sexual activity: Not Currently  Other Topics Concern   Not on file  Social History Narrative   Not on file   Social Determinants of Health   Financial Resource Strain: Not on file  Food Insecurity: Not on file  Transportation Needs: Not on file  Physical Activity: Not on file  Stress: Not on file  Social Connections: Not on file  Intimate Partner Violence: Not on file    Outpatient Medications Prior to Visit  Medication Sig Dispense Refill   aspirin-acetaminophen-caffeine (EXCEDRIN MIGRAINE) 250-250-65 MG tablet Take 1 tablet by mouth every 6 (six) hours as needed for headache.     atorvastatin (LIPITOR) 20 MG tablet Take 1 tablet (20 mg total) by mouth daily. 90 tablet 0   Bismuth Subsalicylate (KAOPECTATE) 262 MG TABS Take 262 mg by mouth daily as needed (upset stomach).     calcium carbonate (TUMS - DOSED IN MG ELEMENTAL CALCIUM) 500 MG chewable tablet Chew 1-2 tablets by mouth daily as needed for indigestion or heartburn.     cetirizine (ZYRTEC) 10 MG tablet Take 10 mg by mouth daily as needed for allergies.  Cholecalciferol (VITAMIN D) 50 MCG (2000 UT) tablet Take 2,000 Units by mouth daily.     diphenhydrAMINE (BENADRYL) 2 % cream Apply 1 application topically 3 (three) times daily as needed for itching.     famotidine (PEPCID) 20 MG tablet One after supper (Patient taking differently: Take 20 mg by mouth daily as needed for heartburn or indigestion.) 30 tablet 11   glimepiride (AMARYL) 4 MG tablet Take 1 tablet (4 mg total) by mouth 2 (two) times daily. 180 tablet 1   guaifenesin (HUMIBID E) 400 MG TABS tablet Take 400 mg by mouth daily.     HYDROcodone-acetaminophen (NORCO/VICODIN) 5-325 MG tablet Take 0.5 tablets by mouth every 6 (six) hours as needed for severe pain.     ibuprofen (ADVIL) 200 MG tablet Take  800 mg by mouth every 8 (eight) hours as needed for mild pain or moderate pain.     mupirocin ointment (BACTROBAN) 2 % Apply small amount to effected area twice daily for 2 weeks then as needed (Patient taking differently: Apply 1 application topically 2 (two) times daily as needed (sore). Apply small amount to effected area twice daily for 2 weeks then as needed) 22 g 1   pantoprazole (PROTONIX) 40 MG tablet Take 1 tablet (40 mg total) by mouth daily. 90 tablet 0   SEGLUROMET 7.03-999 MG TABS Take 1 tablet by mouth 2 (two) times daily. 60 tablet 1   valsartan (DIOVAN) 160 MG tablet Take 160 mg by mouth daily.     No facility-administered medications prior to visit.    No Known Allergies  ROS Review of Systems  Constitutional:  Negative for activity change, appetite change, chills, fatigue and fever.  HENT:  Negative for congestion, postnasal drip, rhinorrhea, sinus pressure, sinus pain, sneezing and sore throat.   Eyes: Negative.   Respiratory:  Negative for cough, chest tightness, shortness of breath and wheezing.   Cardiovascular:  Negative for chest pain and palpitations.  Gastrointestinal:  Negative for abdominal pain, constipation, diarrhea, nausea and vomiting.  Endocrine: Negative for cold intolerance, heat intolerance, polydipsia and polyuria.  Genitourinary:  Positive for frequency and urgency. Negative for dyspareunia, dysuria and flank pain.       Urinary incontinence.  Musculoskeletal:  Negative for arthralgias, back pain and myalgias.  Skin:  Negative for rash.  Allergic/Immunologic: Negative for environmental allergies.  Neurological:  Negative for dizziness, weakness and headaches.  Hematological:  Negative for adenopathy.  Psychiatric/Behavioral:  The patient is not nervous/anxious.      Objective:    Physical Exam Vitals and nursing note reviewed.  Constitutional:      Appearance: Normal appearance. She is well-developed.  HENT:     Head: Normocephalic and  atraumatic.     Nose: Nose normal.     Mouth/Throat:     Mouth: Mucous membranes are moist.  Eyes:     Extraocular Movements: Extraocular movements intact.     Conjunctiva/sclera: Conjunctivae normal.     Pupils: Pupils are equal, round, and reactive to light.  Cardiovascular:     Rate and Rhythm: Normal rate and regular rhythm.     Pulses: Normal pulses.     Heart sounds: Normal heart sounds.  Pulmonary:     Effort: Pulmonary effort is normal.     Breath sounds: Normal breath sounds.  Abdominal:     Palpations: Abdomen is soft.  Musculoskeletal:        General: Normal range of motion.     Cervical back: Normal range  of motion and neck supple.  Lymphadenopathy:     Cervical: No cervical adenopathy.  Skin:    General: Skin is warm and dry.     Capillary Refill: Capillary refill takes less than 2 seconds.  Neurological:     General: No focal deficit present.     Mental Status: She is alert and oriented to person, place, and time.  Psychiatric:        Mood and Affect: Mood normal.        Behavior: Behavior normal.        Thought Content: Thought content normal.        Judgment: Judgment normal.   Today's Vitals   10/27/21 1521  BP: 122/73  Pulse: 73  Temp: 98.2 F (36.8 C)  SpO2: 97%  Weight: 197 lb (89.4 kg)  Height: 5\' 11"  (1.803 m)   Body mass index is 27.48 kg/m.   Wt Readings from Last 3 Encounters:  10/27/21 197 lb (89.4 kg)  10/21/21 197 lb (89.4 kg)  09/10/21 192 lb 6.4 oz (87.3 kg)     Health Maintenance Due  Topic Date Due   COVID-19 Vaccine (1) Never done   FOOT EXAM  Never done   OPHTHALMOLOGY EXAM  Never done   TETANUS/TDAP  Never done   PAP SMEAR-Modifier  Never done   COLONOSCOPY (Pts 45-71yrs Insurance coverage will need to be confirmed)  Never done   MAMMOGRAM  Never done   Zoster Vaccines- Shingrix (1 of 2) Never done    There are no preventive care reminders to display for this patient.  No results found for: TSH Lab Results   Component Value Date   WBC 8.9 08/18/2021   HGB 13.7 08/18/2021   HCT 41.6 08/18/2021   MCV 92.4 08/18/2021   PLT 214 08/18/2021   Lab Results  Component Value Date   NA 138 08/18/2021   K 4.0 08/18/2021   CO2 24 08/18/2021   GLUCOSE 167 (H) 08/18/2021   BUN 25 (H) 08/18/2021   CREATININE 1.25 (H) 08/18/2021   BILITOT 0.6 08/18/2021   ALKPHOS 82 08/18/2021   AST 20 08/18/2021   ALT 13 08/18/2021   PROT 8.3 (H) 08/18/2021   ALBUMIN 4.0 08/18/2021   CALCIUM 9.5 08/18/2021   ANIONGAP 10 08/18/2021   No results found for: CHOL No results found for: HDL No results found for: LDLCALC No results found for: TRIG No results found for: CHOLHDL Lab Results  Component Value Date   HGBA1C 6.6 (H) 08/18/2021      Assessment & Plan:  1. Overactive bladder Trial oxybutynin XL 5 mg tablets daily.  Reviewed Kegel exercises to strengthen pelvic floor muscle. - oxybutynin (DITROPAN-XL) 5 MG 24 hr tablet; Take 1 tablet (5 mg total) by mouth at bedtime.  Dispense: 30 tablet; Refill: 1  2. Vaginal yeast infection Medicated with Diflucan 100 mg tablet once.  May repeat dose in 3 days for persistent symptoms. - fluconazole (DIFLUCAN) 150 MG tablet; Take 1 tablet po once. May repeat dose in 3 days as needed for persistent symptoms.  Dispense: 3 tablet; Refill: 0  3. Body mass index 27.0-27.9, adult Discussed lowering calorie intake to 1500 calories per day and incorporating exercise into daily routine to help lose weight.  4. Diabetes mellitus without complication [facility]) New prescription for diabetic test strips sent to patient's pharmacy today.  Testing should be done once daily and as needed. - glucose blood (TRUETRACK TEST) test strip; Bloodsugar testing QD and prn  Dispense: 100 each; Refill: 12   Problem List Items Addressed This Visit       Endocrine   Diabetes mellitus without complication (HCC)   Relevant Medications   glucose blood (TRUETRACK TEST) test strip      Genitourinary   Vaginal yeast infection   Relevant Medications   fluconazole (DIFLUCAN) 150 MG tablet   Overactive bladder - Primary   Relevant Medications   oxybutynin (DITROPAN-XL) 5 MG 24 hr tablet     Other   Body mass index 27.0-27.9, adult    Meds ordered this encounter  Medications   oxybutynin (DITROPAN-XL) 5 MG 24 hr tablet    Sig: Take 1 tablet (5 mg total) by mouth at bedtime.    Dispense:  30 tablet    Refill:  1    Order Specific Question:   Supervising Provider    Answer:   Nani Gasser D [[id number]]   fluconazole (DIFLUCAN) 150 MG tablet    Sig: Take 1 tablet po once. May repeat dose in 3 days as needed for persistent symptoms.    Dispense:  3 tablet    Refill:  0    Order Specific Question:   Supervising Provider    Answer:   Nani Gasser D [[id number]]   glucose blood (TRUETRACK TEST) test strip    Sig: Bloodsugar testing QD and prn    Dispense:  100 each    Refill:  12    Order Specific Question:   Supervising Provider    Answer:   Nani Gasser D [[id number]]    Follow-up: Return for as scheduled.    Carlean Jews, NP  This note was dictated using Conservation officer, historic buildings. Rapid proofreading was performed to expedite the delivery of the information. Despite proofreading, phonetic errors will occur which are common with this voice recognition software. Please take this into consideration. If there are any concerns, please contact our office.

## 2021-10-28 ENCOUNTER — Ambulatory Visit (HOSPITAL_COMMUNITY): Payer: 59

## 2021-11-02 DIAGNOSIS — N3281 Overactive bladder: Secondary | ICD-10-CM | POA: Insufficient documentation

## 2021-11-02 MED ORDER — TRUETRACK TEST VI STRP: ORAL_STRIP | 12 refills | Status: DC

## 2021-11-02 NOTE — Patient Instructions (Signed)
Fat and Cholesterol Restricted Eating Plan Getting too much fat and cholesterol in your diet may cause health problems. Choosing the right foods helps keep your fat and cholesterol at normal levels. This can keep you from getting certain diseases. Your doctor may recommend an eating plan that includes: Total fat: ______% or less of total calories a day. This is ______g of fat a day. Saturated fat: ______% or less of total calories a day. This is ______g of saturated fat a day. Cholesterol: less than _________mg a day. Fiber: ______g a day. What are tips for following this plan? General tips Work with your doctor to lose weight if you need to. Avoid: Foods with added sugar. Fried foods. Foods with trans fat or partially hydrogenated oils. This includes some margarines and baked goods. If you drink alcohol: Limit how much you have to: 0-1 drink a day for women who are not pregnant. 0-2 drinks a day for men. Know how much alcohol is in a drink. In the [place] one drink equals one 12 oz bottle of beer (355 mL), one 5 oz glass of wine (148 mL), or one 1 oz glass of hard liquor (44 mL). Reading food labels Check food labels for: Trans fats. Partially hydrogenated oils. Saturated fat (g) in each serving. Cholesterol (mg) in each serving. Fiber (g) in each serving. Choose foods with healthy fats, such as: Monounsaturated fats and polyunsaturated fats. These include olive and canola oil, flaxseeds, walnuts, almonds, and seeds. Omega-3 fats. These are found in certain fish, flaxseed oil, and ground flaxseeds. Choose grain products that have whole grains. Look for the word "whole" as the first word in the ingredient list. Cooking Cook foods using low-fat methods. These include baking, boiling, grilling, and broiling. Eat more home-cooked foods. Eat at restaurants and buffets less often. Eat less fast food. Avoid cooking using saturated fats, such as butter, cream, palm oil, palm kernel oil, and  coconut oil. Meal planning  At meals, divide your plate into four equal parts: Fill one-half of your plate with vegetables, green salads, and fruit. Fill one-fourth of your plate with whole grains. Fill one-fourth of your plate with low-fat (lean) protein foods. Eat fish that is high in omega-3 fats at least two times a week. This includes mackerel, tuna, sardines, and salmon. Eat foods that are high in fiber, such as whole grains, beans, apples, pears, berries, broccoli, carrots, peas, and barley. What foods should I eat? Fruits All fresh, canned (in natural juice), or frozen fruits. Vegetables Fresh or frozen vegetables (raw, steamed, roasted, or grilled). Green salads. Grains Whole grains, such as whole wheat or whole grain breads, crackers, cereals, and pasta. Unsweetened oatmeal, bulgur, barley, quinoa, or brown rice. Corn or whole wheat flour tortillas. Meats and other protein foods Ground beef (85% or leaner), grass-fed beef, or beef trimmed of fat. Skinless chicken or turkey. Ground chicken or turkey. Pork trimmed of fat. All fish and seafood. Egg whites. Dried beans, peas, or lentils. Unsalted nuts or seeds. Unsalted canned beans. Nut butters without added sugar or oil. Dairy Low-fat or nonfat dairy products, such as skim or 1% milk, 2% or reduced-fat cheeses, low-fat and fat-free ricotta or cottage cheese, or plain low-fat and nonfat yogurt. Fats and oils Tub margarine without trans fats. Light or reduced-fat mayonnaise and salad dressings. Avocado. Olive, canola, sesame, or safflower oils. The items listed above may not be a complete list of foods and beverages you can eat. Contact a dietitian for more information. What foods   should I avoid? Fruits Canned fruit in heavy syrup. Fruit in cream or butter sauce. Fried fruit. Vegetables Vegetables cooked in cheese, cream, or butter sauce. Fried vegetables. Grains White bread. White pasta. White rice. Cornbread. Bagels, pastries,  and croissants. Crackers and snack foods that contain trans fat and hydrogenated oils. Meats and other protein foods Fatty cuts of meat. Ribs, chicken wings, bacon, sausage, bologna, salami, chitterlings, fatback, hot dogs, bratwurst, and packaged lunch meats. Liver and organ meats. Whole eggs and egg yolks. Chicken and turkey with skin. Fried meat. Dairy Whole or 2% milk, cream, half-and-half, and cream cheese. Whole milk cheeses. Whole-fat or sweetened yogurt. Full-fat cheeses. Nondairy creamers and whipped toppings. Processed cheese, cheese spreads, and cheese curds. Fats and oils Butter, stick margarine, lard, shortening, ghee, or bacon fat. Coconut, palm kernel, and palm oils. Beverages Alcohol. Sugar-sweetened drinks such as sodas, lemonade, and fruit drinks. Sweets and desserts Corn syrup, sugars, honey, and molasses. Candy. Jam and jelly. Syrup. Sweetened cereals. Cookies, pies, cakes, donuts, muffins, and ice cream. The items listed above may not be a complete list of foods and beverages you should avoid. Contact a dietitian for more information. Summary Choosing the right foods helps keep your fat and cholesterol at normal levels. This can keep you from getting certain diseases. At meals, fill one-half of your plate with vegetables, green salads, and fruits. Eat high fiber foods, like whole grains, beans, apples, pears, berries, carrots, peas, and barley. Limit added sugar, saturated fats, alcohol, and fried foods. This information is not intended to replace advice given to you by your health care provider. Make sure you discuss any questions you have with your health care provider. Document Revised: 03/28/2021 Document Reviewed: 03/28/2021 Elsevier Patient Education  2022 [employer]  

## 2021-11-03 ENCOUNTER — Telehealth: Payer: Self-pay | Admitting: Nurse Practitioner

## 2021-11-03 NOTE — Telephone Encounter (Signed)
Patient states RX she picked up of the Segluromet was 7.5-500 and that she normally takes the 7.03-999. I advised patient that last RX sent to pharmacy was for the 7.5-1000mg  and she needs to contact pharmacy and advise she was given the wrong medication. She asks if she can just take 2 tabs, I advised patient no. She needs to contact pharmacy. Pt verbalized understanding. AS, CMA

## 2021-11-17 ENCOUNTER — Ambulatory Visit (HOSPITAL_COMMUNITY)
Admission: RE | Admit: 2021-11-17 | Discharge: 2021-11-17 | Disposition: A | Discharge status: Home / Self Care | Payer: 59 | Source: Ambulatory Visit | Attending: Infectious Diseases | Admitting: Infectious Diseases

## 2021-11-17 ENCOUNTER — Other Ambulatory Visit: Payer: Self-pay

## 2021-11-17 DIAGNOSIS — B182 Chronic viral hepatitis C: Secondary | ICD-10-CM | POA: Diagnosis present

## 2021-11-18 ENCOUNTER — Telehealth: Payer: Self-pay

## 2021-11-18 NOTE — Progress Notes (Signed)
Hi team,  Please let her know that the Ultrasound abdomen is suggestive of chronic liver disease. She will need to have ultrasound of her abdomen done every 6 months to screen for Hepatocellular cancer. She also needs to see a GI doctor for EGD for screening for varices. Let me know if I need to put in a referral or she can get referred through her PCP.

## 2021-11-18 NOTE — Telephone Encounter (Signed)
Attempted to contact patient, phone n/a. Will send patient a MyChart message with results and recommendations.  Danielle Hurley

## 2021-11-18 NOTE — Telephone Encounter (Signed)
-----   Message from Odette Fraction, MD sent at 11/18/2021  8:08 AM EST ----- Hi team,  Please let her know that the Ultrasound abdomen is suggestive of chronic liver disease. She will need to have ultrasound of her abdomen done every 6 months to screen for Hepatocellular cancer. She also needs to see a GI doctor for EGD for screening for varices. Let me know if I need to put in a referral or she can get referred through her PCP.

## 2021-11-19 ENCOUNTER — Ambulatory Visit: Payer: 59 | Admitting: Nurse Practitioner

## 2021-11-19 NOTE — Telephone Encounter (Signed)
Ok. Thank you.

## 2021-11-19 NOTE — Telephone Encounter (Signed)
Yes. It looks like she has an appointment with me next week and I can place the order then. Is there anywhere in particular you want her to go for the ultrasound?

## 2021-11-19 NOTE — Telephone Encounter (Signed)
Great. Thank you.

## 2021-11-19 NOTE — Progress Notes (Signed)
Will discus with patient at her next viit and set her up for six month ultrasound. She has history of hepatitis C.

## 2021-11-26 ENCOUNTER — Other Ambulatory Visit (HOSPITAL_COMMUNITY)
Admission: RE | Admit: 2021-11-26 | Discharge: 2021-11-26 | Disposition: A | Discharge status: Home / Self Care | Payer: 59 | Source: Ambulatory Visit | Attending: Nurse Practitioner | Admitting: Nurse Practitioner

## 2021-11-26 ENCOUNTER — Encounter: Payer: Self-pay | Admitting: Nurse Practitioner

## 2021-11-26 ENCOUNTER — Ambulatory Visit (INDEPENDENT_AMBULATORY_CARE_PROVIDER_SITE_OTHER): Payer: 59 | Admitting: Nurse Practitioner

## 2021-11-26 ENCOUNTER — Other Ambulatory Visit: Payer: Self-pay

## 2021-11-26 VITALS — BP 136/73 | HR 79 | Temp 98.2°F | Ht 71.0 in | Wt 195.0 lb

## 2021-11-26 DIAGNOSIS — E119 Type 2 diabetes mellitus without complications: Secondary | ICD-10-CM | POA: Diagnosis not present

## 2021-11-26 DIAGNOSIS — Z23 Encounter for immunization: Secondary | ICD-10-CM | POA: Diagnosis not present

## 2021-11-26 DIAGNOSIS — Z01419 Encounter for gynecological examination (general) (routine) without abnormal findings: Secondary | ICD-10-CM

## 2021-11-26 DIAGNOSIS — I152 Hypertension secondary to endocrine disorders: Secondary | ICD-10-CM

## 2021-11-26 DIAGNOSIS — Z6827 Body mass index (BMI) 27.0-27.9, adult: Secondary | ICD-10-CM

## 2021-11-26 DIAGNOSIS — K7460 Unspecified cirrhosis of liver: Secondary | ICD-10-CM

## 2021-11-26 DIAGNOSIS — E1159 Type 2 diabetes mellitus with other circulatory complications: Secondary | ICD-10-CM | POA: Diagnosis not present

## 2021-11-26 DIAGNOSIS — N3281 Overactive bladder: Secondary | ICD-10-CM

## 2021-11-26 LAB — POCT GLYCOSYLATED HEMOGLOBIN (HGB A1C): Hemoglobin A1C: 7.1 % — AB (ref 4.0–5.6)

## 2021-11-26 NOTE — Progress Notes (Signed)
Established Patient Office Visit  Subjective:  Patient ID: Danielle Hurley, female    DOB: Jul 28, 1961  Age: 60 y.o. MRN: [id number]  CC:  Chief Complaint  Patient presents with   Annual Exam   Gynecologic Exam    HPI Danielle Hurley presents for annual wellness visit and pap smear. Blood sugars are doing well. HgbA1c 7.1 today. This is up slightly from the previous check which was 6.6. she states this continues to be good for her.  She states that she has not started taking medication for overactive bladder. She states that now, she has to force the urine out of the bladder. She does have some pelvic heaviness.  Had episode of intestinal cramping the other day.  Felt like intestines were getting tied in knots. Did not have diarrhea.  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 Recently had bronchoscopy. Was told no reason to continue to see him unless she developed a problem. Does still have chronic cough which is productive. No color to sputum  She reports no new negative concerns or complaints.   Past Medical History:  Diagnosis Date   Arthritis    COPD (chronic obstructive pulmonary disease) (HCC)    Diabetes mellitus without complication (HCC)    Dyspnea    GERD (gastroesophageal reflux disease)    Hepatitis C    High cholesterol    History of kidney stones    Hypertension    Tuberculosis    non contagious Dx 05/2020    Past Surgical History:  Procedure Laterality Date   CHOLECYSTECTOMY  1998   TUBAL LIGATION  01/1995   VIDEO BRONCHOSCOPY WITH ENDOBRONCHIAL NAVIGATION N/A 09/04/2021   Procedure: VIDEO BRONCHOSCOPY WITH ENDOBRONCHIAL NAVIGATION;  Surgeon: Loreli Slot, MD;  Location: [facility] OR;  Service: Thoracic;  Laterality: N/A;    History reviewed. No pertinent family history.  Social History   Socioeconomic History   Marital status: Widowed    Spouse name: Not on file   Number of children: 4   Years of education: Not on file   Highest education level:  12th grade  Occupational History   Not on file  Tobacco Use   Smoking status: Former    Packs/day: 1.00    Years: 40.00    Pack years: 40.00    Types: Cigarettes    Quit date: 2014    Years since quitting: 9.0   Smokeless tobacco: Never  Vaping Use   Vaping Use: Never used  Substance and Sexual Activity   Alcohol use: Yes    Comment: occasional   Drug use: Not Currently   Sexual activity: Not Currently  Other Topics Concern   Not on file  Social History Narrative   Not on file   Social Determinants of Health   Financial Resource Strain: Not on file  Food Insecurity: Not on file  Transportation Needs: Not on file  Physical Activity: Not on file  Stress: Not on file  Social Connections: Not on file  Intimate Partner Violence: Not on file    Outpatient Medications Prior to Visit  Medication Sig Dispense Refill   aspirin-acetaminophen-caffeine (EXCEDRIN MIGRAINE) 250-250-65 MG tablet Take 1 tablet by mouth every 6 (six) hours as needed for headache.     atorvastatin (LIPITOR) 20 MG tablet Take 1 tablet (20 mg total) by mouth daily. 90 tablet 0   Bismuth Subsalicylate (KAOPECTATE) 262 MG TABS Take 262 mg by mouth daily as needed (upset stomach).     calcium carbonate (TUMS -  DOSED IN MG ELEMENTAL CALCIUM) 500 MG chewable tablet Chew 1-2 tablets by mouth daily as needed for indigestion or heartburn.     cetirizine (ZYRTEC) 10 MG tablet Take 10 mg by mouth daily as needed for allergies.     Cholecalciferol (VITAMIN D) 50 MCG (2000 UT) tablet Take 2,000 Units by mouth daily.     diphenhydrAMINE (BENADRYL) 2 % cream Apply 1 application topically 3 (three) times daily as needed for itching.     famotidine (PEPCID) 20 MG tablet One after supper (Patient taking differently: Take 20 mg by mouth daily as needed for heartburn or indigestion.) 30 tablet 11   fluconazole (DIFLUCAN) 150 MG tablet Take 1 tablet po once. May repeat dose in 3 days as needed for persistent symptoms. 3 tablet 0    glimepiride (AMARYL) 4 MG tablet Take 1 tablet (4 mg total) by mouth 2 (two) times daily. 180 tablet 1   glucose blood (TRUETRACK TEST) test strip Bloodsugar testing QD and prn 100 each 12   guaifenesin (HUMIBID E) 400 MG TABS tablet Take 400 mg by mouth daily.     HYDROcodone-acetaminophen (NORCO/VICODIN) 5-325 MG tablet Take 0.5 tablets by mouth every 6 (six) hours as needed for severe pain.     ibuprofen (ADVIL) 200 MG tablet Take 800 mg by mouth every 8 (eight) hours as needed for mild pain or moderate pain.     mupirocin ointment (BACTROBAN) 2 % Apply small amount to effected area twice daily for 2 weeks then as needed (Patient taking differently: Apply 1 application topically 2 (two) times daily as needed (sore). Apply small amount to effected area twice daily for 2 weeks then as needed) 22 g 1   oxybutynin (DITROPAN-XL) 5 MG 24 hr tablet Take 1 tablet (5 mg total) by mouth at bedtime. 30 tablet 1   pantoprazole (PROTONIX) 40 MG tablet Take 1 tablet (40 mg total) by mouth daily. 90 tablet 0   SEGLUROMET 7.03-999 MG TABS Take 1 tablet by mouth 2 (two) times daily. 60 tablet 1   valsartan (DIOVAN) 160 MG tablet Take 160 mg by mouth daily.     No facility-administered medications prior to visit.    No Known Allergies  ROS Review of Systems  Constitutional:  Positive for fatigue. Negative for activity change, appetite change, chills and fever.  HENT:  Negative for congestion, postnasal drip, rhinorrhea, sinus pressure, sinus pain, sneezing and sore throat.   Eyes: Negative.   Respiratory:  Positive for cough and shortness of breath. Negative for chest tightness and wheezing.   Cardiovascular:  Negative for chest pain and palpitations.  Gastrointestinal:  Positive for nausea. Negative for abdominal pain, constipation, diarrhea and vomiting.       Histolry of hepatitis C. Did have treatment with Maveret.   Endocrine: Negative for cold intolerance, heat intolerance, polydipsia and  polyuria.       Blood sugars have been stable.   Genitourinary:  Negative for dyspareunia, dysuria, flank pain, frequency and urgency.  Musculoskeletal:  Negative for arthralgias, back pain and myalgias.  Skin:  Negative for rash.  Allergic/Immunologic: Negative for environmental allergies.  Neurological:  Negative for dizziness, weakness and headaches.  Hematological:  Negative for adenopathy.  Psychiatric/Behavioral:  The patient is nervous/anxious.      Objective:    Physical Exam Vitals and nursing note reviewed. Exam conducted with a chaperone present.  Constitutional:      Appearance: Normal appearance. She is well-developed.  HENT:     Head:  Normocephalic and atraumatic.     Right Ear: Tympanic membrane, ear canal and external ear normal.     Left Ear: Tympanic membrane, ear canal and external ear normal.     Nose: Nose normal.     Mouth/Throat:     Mouth: Mucous membranes are moist.     Pharynx: Oropharynx is clear.  Eyes:     Extraocular Movements: Extraocular movements intact.     Conjunctiva/sclera: Conjunctivae normal.     Pupils: Pupils are equal, round, and reactive to light.  Neck:     Vascular: No carotid bruit.  Cardiovascular:     Rate and Rhythm: Normal rate and regular rhythm.     Pulses: Normal pulses.     Heart sounds: Normal heart sounds.  Pulmonary:     Effort: Pulmonary effort is normal.     Breath sounds: Normal breath sounds.  Chest:  Breasts:    Right: Normal. No swelling, bleeding, inverted nipple, mass, nipple discharge, skin change or tenderness.     Left: Normal. No swelling, bleeding, inverted nipple, mass, nipple discharge, skin change or tenderness.  Abdominal:     General: Bowel sounds are normal. There is no distension.     Palpations: Abdomen is soft. There is no mass.     Tenderness: There is abdominal tenderness. There is no guarding or rebound.     Hernia: No hernia is present. There is no hernia in the left inguinal area or right  inguinal area.     Comments: Mild, generalized abdominal tenderness with palpation of the abdomen.   Genitourinary:    General: Normal vulva.     Exam position: Supine.     Labia:        Right: No rash, tenderness or lesion.        Left: No rash, tenderness or lesion.      Vagina: No signs of injury and foreign body. No vaginal discharge, erythema, tenderness or bleeding.     Cervix: Normal.     Uterus: Normal.      Adnexa: Right adnexa normal and left adnexa normal.       Comments: No tenderness, masses, or organomeglay present during bimanual exam .   Musculoskeletal:        General: Normal range of motion.     Cervical back: Normal range of motion and neck supple.  Lymphadenopathy:     Cervical: No cervical adenopathy.     Upper Body:     Right upper body: No axillary adenopathy.     Left upper body: No axillary adenopathy.     Lower Body: No right inguinal adenopathy. No left inguinal adenopathy.  Skin:    General: Skin is warm and dry.     Capillary Refill: Capillary refill takes less than 2 seconds.  Neurological:     General: No focal deficit present.     Mental Status: She is alert and oriented to person, place, and time.  Psychiatric:        Mood and Affect: Mood normal.        Behavior: Behavior normal.        Thought Content: Thought content normal.        Judgment: Judgment normal.    Today's Vitals   11/26/21 1454  BP: 136/73  Pulse: 79  Temp: 98.2 F (36.8 C)  SpO2: 95%  Weight: 195 lb (88.5 kg)  Height: 5\' 11"  (1.803 m)   Body mass index is 27.2 kg/m.  Wt Readings  from Last 3 Encounters:  11/26/21 195 lb (88.5 kg)  10/27/21 197 lb (89.4 kg)  10/21/21 197 lb (89.4 kg)     Health Maintenance Due  Topic Date Due   COVID-19 Vaccine (1) Never done   FOOT EXAM  Never done   OPHTHALMOLOGY EXAM  Never done   TETANUS/TDAP  Never done   COLONOSCOPY (Pts 45-62yrs Insurance coverage will need to be confirmed)  Never done   MAMMOGRAM  Never done     There are no preventive care reminders to display for this patient.  No results found for: TSH Lab Results  Component Value Date   WBC 8.9 08/18/2021   HGB 13.7 08/18/2021   HCT 41.6 08/18/2021   MCV 92.4 08/18/2021   PLT 214 08/18/2021   Lab Results  Component Value Date   NA 138 08/18/2021   K 4.0 08/18/2021   CO2 24 08/18/2021   GLUCOSE 167 (H) 08/18/2021   BUN 25 (H) 08/18/2021   CREATININE 1.25 (H) 08/18/2021   BILITOT 0.6 08/18/2021   ALKPHOS 82 08/18/2021   AST 20 08/18/2021   ALT 13 08/18/2021   PROT 8.3 (H) 08/18/2021   ALBUMIN 4.0 08/18/2021   CALCIUM 9.5 08/18/2021   ANIONGAP 10 08/18/2021    Lab Results  Component Value Date   HGBA1C 7.1 (A) 11/26/2021      Assessment & Plan:  1. Well woman exam Annual wellness visit today.  Pap smear obtained during today's visit. - Cytology - PAP( [place])  2. Diabetes mellitus without complication (HCC) Hemoglobin A1c 7.1 today.  No changes made in diabetic medications.  3. Hypertension associated with diabetes (HCC) Continue diabetic medication as prescribed.  Blood pressure stable.  Continue meds as prescribed. - POCT glycosylated hemoglobin (Hb A1C)  4. Hepatic cirrhosis, unspecified hepatic cirrhosis type, unspecified whether ascites present [facility]) Reviewed results of most recent abdominal ultrasound.  It did show hepatic echotexture nodular hepatic contour suggestive of cirrhosis and diagnostic category: > or =17 kPa: highly suggestive of cACLD with an increased probability of clinically significant portal hypertension. No overt hepatic lesion visualized.  We will set up for repeat ultrasound in 6 months as requested by GI provider.  We will also refer to GI for continued evaluation. - Ambulatory referral to Gastroenterology - US Abdomen Complete; Future  5. Overactive bladder Patient continues to have overactive bladder.  Reluctant to start medication due to potential side effects.  Will refer to  neurology for further evaluation. - Ambulatory referral to Urology  6. Body mass index 27.0-27.9, adult Discussed lowering calorie intake to 1500 calories per day and incorporating exercise into daily routine to help lose weight.  7. Need for shingles vaccine Initial shingles vaccine administered during today's visit. - Varicella-zoster vaccine IM   Problem List Items Addressed This Visit       Cardiovascular and Mediastinum   Hypertension associated with diabetes (HCC)   Relevant Orders   POCT glycosylated hemoglobin (Hb A1C) (Completed)     Digestive   Hepatic cirrhosis (HCC)   Relevant Orders   Ambulatory referral to Gastroenterology   US Abdomen Complete     Endocrine   Diabetes mellitus without complication [facility])     Genitourinary   Overactive bladder   Relevant Orders   Ambulatory referral to Urology     Other   Body mass index 27.0-27.9, adult   Other Visit Diagnoses     Well woman exam    -  Primary   Relevant  Orders   Cytology - PAP( [place]) (Completed)   Need for shingles vaccine       Relevant Orders   Varicella-zoster vaccine IM (Completed)        Follow-up: Return in about 3 months (around 02/24/2022) for diabetes with HgbA1c check - needs to make appontment for [facility] in next few days.    Carlean Jews, NP  This note was dictated using Conservation officer, historic buildings. Rapid proofreading was performed to expedite the delivery of the information. Despite proofreading, phonetic errors will occur which are common with this voice recognition software. Please take this into consideration. If there are any concerns, please contact our office.

## 2021-11-28 LAB — CYTOLOGY - PAP
Comment: NEGATIVE
Diagnosis: NEGATIVE
High risk HPV: NEGATIVE

## 2021-12-01 NOTE — Patient Instructions (Signed)
Fat and Cholesterol Restricted Eating Plan Getting too much fat and cholesterol in your diet may cause health problems. Choosing the right foods helps keep your fat and cholesterol at normal levels. This can keep you from getting certain diseases. Your doctor may recommend an eating plan that includes: Total fat: ______% or less of total calories a day. This is ______g of fat a day. Saturated fat: ______% or less of total calories a day. This is ______g of saturated fat a day. Cholesterol: less than _________mg a day. Fiber: ______g a day. What are tips for following this plan? General tips Work with your doctor to lose weight if you need to. Avoid: Foods with added sugar. Fried foods. Foods with trans fat or partially hydrogenated oils. This includes some margarines and baked goods. If you drink alcohol: Limit how much you have to: 0-1 drink a day for women who are not pregnant. 0-2 drinks a day for men. Know how much alcohol is in a drink. In the [place] one drink equals one 12 oz bottle of beer (355 mL), one 5 oz glass of wine (148 mL), or one 1 oz glass of hard liquor (44 mL). Reading food labels Check food labels for: Trans fats. Partially hydrogenated oils. Saturated fat (g) in each serving. Cholesterol (mg) in each serving. Fiber (g) in each serving. Choose foods with healthy fats, such as: Monounsaturated fats and polyunsaturated fats. These include olive and canola oil, flaxseeds, walnuts, almonds, and seeds. Omega-3 fats. These are found in certain fish, flaxseed oil, and ground flaxseeds. Choose grain products that have whole grains. Look for the word "whole" as the first word in the ingredient list. Cooking Cook foods using low-fat methods. These include baking, boiling, grilling, and broiling. Eat more home-cooked foods. Eat at restaurants and buffets less often. Eat less fast food. Avoid cooking using saturated fats, such as butter, cream, palm oil, palm kernel oil, and  coconut oil. Meal planning  At meals, divide your plate into four equal parts: Fill one-half of your plate with vegetables, green salads, and fruit. Fill one-fourth of your plate with whole grains. Fill one-fourth of your plate with low-fat (lean) protein foods. Eat fish that is high in omega-3 fats at least two times a week. This includes mackerel, tuna, sardines, and salmon. Eat foods that are high in fiber, such as whole grains, beans, apples, pears, berries, broccoli, carrots, peas, and barley. What foods should I eat? Fruits All fresh, canned (in natural juice), or frozen fruits. Vegetables Fresh or frozen vegetables (raw, steamed, roasted, or grilled). Green salads. Grains Whole grains, such as whole wheat or whole grain breads, crackers, cereals, and pasta. Unsweetened oatmeal, bulgur, barley, quinoa, or brown rice. Corn or whole wheat flour tortillas. Meats and other protein foods Ground beef (85% or leaner), grass-fed beef, or beef trimmed of fat. Skinless chicken or turkey. Ground chicken or turkey. Pork trimmed of fat. All fish and seafood. Egg whites. Dried beans, peas, or lentils. Unsalted nuts or seeds. Unsalted canned beans. Nut butters without added sugar or oil. Dairy Low-fat or nonfat dairy products, such as skim or 1% milk, 2% or reduced-fat cheeses, low-fat and fat-free ricotta or cottage cheese, or plain low-fat and nonfat yogurt. Fats and oils Tub margarine without trans fats. Light or reduced-fat mayonnaise and salad dressings. Avocado. Olive, canola, sesame, or safflower oils. The items listed above may not be a complete list of foods and beverages you can eat. Contact a dietitian for more information. What foods   should I avoid? Fruits Canned fruit in heavy syrup. Fruit in cream or butter sauce. Fried fruit. Vegetables Vegetables cooked in cheese, cream, or butter sauce. Fried vegetables. Grains White bread. White pasta. White rice. Cornbread. Bagels, pastries,  and croissants. Crackers and snack foods that contain trans fat and hydrogenated oils. Meats and other protein foods Fatty cuts of meat. Ribs, chicken wings, bacon, sausage, bologna, salami, chitterlings, fatback, hot dogs, bratwurst, and packaged lunch meats. Liver and organ meats. Whole eggs and egg yolks. Chicken and turkey with skin. Fried meat. Dairy Whole or 2% milk, cream, half-and-half, and cream cheese. Whole milk cheeses. Whole-fat or sweetened yogurt. Full-fat cheeses. Nondairy creamers and whipped toppings. Processed cheese, cheese spreads, and cheese curds. Fats and oils Butter, stick margarine, lard, shortening, ghee, or bacon fat. Coconut, palm kernel, and palm oils. Beverages Alcohol. Sugar-sweetened drinks such as sodas, lemonade, and fruit drinks. Sweets and desserts Corn syrup, sugars, honey, and molasses. Candy. Jam and jelly. Syrup. Sweetened cereals. Cookies, pies, cakes, donuts, muffins, and ice cream. The items listed above may not be a complete list of foods and beverages you should avoid. Contact a dietitian for more information. Summary Choosing the right foods helps keep your fat and cholesterol at normal levels. This can keep you from getting certain diseases. At meals, fill one-half of your plate with vegetables, green salads, and fruits. Eat high fiber foods, like whole grains, beans, apples, pears, berries, carrots, peas, and barley. Limit added sugar, saturated fats, alcohol, and fried foods. This information is not intended to replace advice given to you by your health care provider. Make sure you discuss any questions you have with your health care provider. Document Revised: 03/28/2021 Document Reviewed: 03/28/2021 Elsevier Patient Education  2022 [employer]  

## 2021-12-02 ENCOUNTER — Other Ambulatory Visit: Payer: Self-pay

## 2021-12-02 ENCOUNTER — Other Ambulatory Visit: Payer: 59

## 2021-12-02 DIAGNOSIS — I152 Hypertension secondary to endocrine disorders: Secondary | ICD-10-CM

## 2021-12-02 DIAGNOSIS — Z1329 Encounter for screening for other suspected endocrine disorder: Secondary | ICD-10-CM

## 2021-12-02 DIAGNOSIS — E1169 Type 2 diabetes mellitus with other specified complication: Secondary | ICD-10-CM

## 2021-12-02 DIAGNOSIS — K7460 Unspecified cirrhosis of liver: Secondary | ICD-10-CM

## 2021-12-03 ENCOUNTER — Encounter: Payer: Self-pay | Admitting: Nurse Practitioner

## 2021-12-03 LAB — COMPREHENSIVE METABOLIC PANEL
ALT: 9 IU/L (ref 0–32)
AST: 17 IU/L (ref 0–40)
Albumin/Globulin Ratio: 1.3 (ref 1.2–2.2)
Albumin: 4.4 g/dL (ref 3.8–4.8)
Alkaline Phosphatase: 133 IU/L — ABNORMAL HIGH (ref 44–121)
BUN/Creatinine Ratio: 22 (ref 12–28)
BUN: 27 mg/dL (ref 8–27)
Bilirubin Total: 0.4 mg/dL (ref 0.0–1.2)
CO2: 22 mmol/L (ref 20–29)
Calcium: 9.5 mg/dL (ref 8.7–10.3)
Chloride: 103 mmol/L (ref 96–106)
Creatinine, Ser: 1.23 mg/dL — ABNORMAL HIGH (ref 0.57–1.00)
Globulin, Total: 3.3 g/dL (ref 1.5–4.5)
Glucose: 158 mg/dL — ABNORMAL HIGH (ref 70–99)
Potassium: 4 mmol/L (ref 3.5–5.2)
Sodium: 142 mmol/L (ref 134–144)
Total Protein: 7.7 g/dL (ref 6.0–8.5)
eGFR: 50 mL/min/{1.73_m2} — ABNORMAL LOW (ref >59)

## 2021-12-03 LAB — CBC WITH DIFFERENTIAL/PLATELET
Basophils Absolute: 0 10*3/uL (ref 0.0–0.2)
Basos: 1 %
EOS (ABSOLUTE): 0.3 10*3/uL (ref 0.0–0.4)
Eos: 4 %
Hematocrit: 37.8 % (ref 34.0–46.6)
Hemoglobin: 12.9 g/dL (ref 11.1–15.9)
Immature Grans (Abs): 0 10*3/uL (ref 0.0–0.1)
Immature Granulocytes: 0 %
Lymphocytes Absolute: 2.4 10*3/uL (ref 0.7–3.1)
Lymphs: 31 %
MCH: 28.5 pg (ref 26.6–33.0)
MCHC: 34.1 g/dL (ref 31.5–35.7)
MCV: 84 fL (ref 79–97)
Monocytes Absolute: 0.4 10*3/uL (ref 0.1–0.9)
Monocytes: 6 %
Neutrophils Absolute: 4.7 10*3/uL (ref 1.4–7.0)
Neutrophils: 58 %
Platelets: 233 10*3/uL (ref 150–450)
RBC: 4.52 x10E6/uL (ref 3.77–5.28)
RDW: 13.1 % (ref 11.7–15.4)
WBC: 7.9 10*3/uL (ref 3.4–10.8)

## 2021-12-03 LAB — LIPID PANEL
Chol/HDL Ratio: 3.3 ratio (ref 0.0–4.4)
Cholesterol, Total: 161 mg/dL (ref 100–199)
HDL: 49 mg/dL (ref >39)
LDL Chol Calc (NIH): 83 mg/dL (ref 0–99)
Triglycerides: 171 mg/dL — ABNORMAL HIGH (ref 0–149)
VLDL Cholesterol Cal: 29 mg/dL (ref 5–40)

## 2021-12-03 LAB — TSH: TSH: 2.63 u[IU]/mL (ref 0.450–4.500)

## 2021-12-03 NOTE — Progress Notes (Signed)
Triglycerides mildly elevated and renal functions are mildly elevatedbut stable. Will continue to monitor. Mychart message sent to the patient.

## 2021-12-03 NOTE — Progress Notes (Signed)
Normal pap smear. Repeat in three years. myChart message sent to patient.

## 2021-12-22 ENCOUNTER — Ambulatory Visit: Payer: 59 | Admitting: Urology

## 2021-12-22 NOTE — Progress Notes (Deleted)
° °  Assessment: No diagnosis found.   Plan: ***  Chief Complaint: No chief complaint on file.   History of Present Illness:  Danielle Hurley is a 61 y.o. year old female who is seen in consultation from Ronnell Freshwater, NP for evaluation of urinary incontinence.   Past Medical History:  Past Medical History:  Diagnosis Date   Arthritis    COPD (chronic obstructive pulmonary disease) ([place])    Diabetes mellitus without complication (HCC)    Dyspnea    GERD (gastroesophageal reflux disease)    Hepatitis C    High cholesterol    History of kidney stones    Hypertension    Tuberculosis    non contagious Dx 05/2020    Past Surgical History:  Past Surgical History:  Procedure Laterality Date   CHOLECYSTECTOMY  1998   TUBAL LIGATION  01/1995   VIDEO BRONCHOSCOPY WITH ENDOBRONCHIAL NAVIGATION N/A 09/04/2021   Procedure: VIDEO BRONCHOSCOPY WITH ENDOBRONCHIAL NAVIGATION;  Surgeon: Melrose Nakayama, MD;  Location: [facility] OR;  Service: Thoracic;  Laterality: N/A;    Allergies:  No Known Allergies  Family History:  No family history on file.  Social History:  Social History   Tobacco Use   Smoking status: Former    Packs/day: 1.00    Years: 40.00    Pack years: 40.00    Types: Cigarettes    Quit date: 2014    Years since quitting: 9.0   Smokeless tobacco: Never  Vaping Use   Vaping Use: Never used  Substance Use Topics   Alcohol use: Yes    Comment: occasional   Drug use: Not Currently    Review of symptoms:  Constitutional:  Negative for unexplained weight loss, night sweats, fever, chills ENT:  Negative for nose bleeds, sinus pain, painful swallowing CV:  Negative for chest pain, shortness of breath, exercise intolerance, palpitations, loss of consciousness Resp:  Negative for cough, wheezing, shortness of breath GI:  Negative for nausea, vomiting, diarrhea, bloody stools GU:  Positives noted in HPI; otherwise negative for gross hematuria, dysuria, urinary  incontinence Neuro:  Negative for seizures, poor balance, limb weakness, slurred speech Psych:  Negative for lack of energy, depression, anxiety Endocrine:  Negative for polydipsia, polyuria, symptoms of hypoglycemia (dizziness, hunger, sweating) Hematologic:  Negative for anemia, purpura, petechia, prolonged or excessive bleeding, use of anticoagulants  Allergic:  Negative for difficulty breathing or choking as a result of exposure to anything; no shellfish allergy; no allergic response (rash/itch) to materials, foods  Physical exam: There were no vitals taken for this visit. GENERAL APPEARANCE:  Well appearing, well developed, well nourished, NAD HEENT: Atraumatic, Normocephalic, oropharynx clear. NECK: Supple without lymphadenopathy or thyromegaly. LUNGS: Clear to auscultation bilaterally. HEART: Regular Rate and Rhythm without murmurs, gallops, or rubs. ABDOMEN: Soft, non-tender, No Masses. EXTREMITIES: Moves all extremities well.  Without clubbing, cyanosis, or edema. NEUROLOGIC:  Alert and oriented x 3, normal gait, CN II-XII grossly intact.  MENTAL STATUS:  Appropriate. BACK:  Non-tender to palpation.  No CVAT SKIN:  Warm, dry and intact.    Results: No results found for this or any previous visit (from the past 24 hour(s)).

## 2021-12-30 ENCOUNTER — Telehealth: Payer: Self-pay | Admitting: Nurse Practitioner

## 2021-12-30 ENCOUNTER — Other Ambulatory Visit: Payer: Self-pay | Admitting: Nurse Practitioner

## 2021-12-30 DIAGNOSIS — N3281 Overactive bladder: Secondary | ICD-10-CM

## 2021-12-30 NOTE — Telephone Encounter (Signed)
Please let the patient know that I have put a new referral for urology into Epic for her. I made a note that she needed to go somewhere closer to her home due to transportation conflict. Thanks  -HB

## 2021-12-30 NOTE — Telephone Encounter (Signed)
Patient is requesting she be referred to a closer urologist, the one she is being sent to is in [place] and she doesn't drive. Can you please advise?

## 2021-12-31 NOTE — Telephone Encounter (Signed)
Called she is advised of her referral that was placed for Urology

## 2022-01-03 ENCOUNTER — Other Ambulatory Visit: Payer: Self-pay | Admitting: Nurse Practitioner

## 2022-01-03 DIAGNOSIS — E119 Type 2 diabetes mellitus without complications: Secondary | ICD-10-CM

## 2022-01-06 ENCOUNTER — Ambulatory Visit: Payer: 59 | Admitting: Urology

## 2022-01-19 ENCOUNTER — Telehealth: Payer: Self-pay | Admitting: Nurse Practitioner

## 2022-01-19 ENCOUNTER — Other Ambulatory Visit: Payer: Self-pay | Admitting: Nurse Practitioner

## 2022-01-19 DIAGNOSIS — E119 Type 2 diabetes mellitus without complications: Secondary | ICD-10-CM

## 2022-01-19 MED ORDER — SYNJARDY XR 25-1000 MG PO TB24: 1.0000 | ORAL_TABLET | Freq: Every day | ORAL | 3 refills | Status: DC

## 2022-01-19 NOTE — Progress Notes (Signed)
D/c segluramet due to non coverage by patient's insurance. Added synjardy XR 25/1000 mg daily. Sent to her pharmacy.

## 2022-01-19 NOTE — Telephone Encounter (Signed)
I d/c'd her segluramet due to non coverage from her insurance. Added synjardy XR 25/1000 mg daily. Sent to her pharmacy.

## 2022-01-19 NOTE — Telephone Encounter (Signed)
Patients insurance does not cover Segluromet. She states they will cover Synjardy. She is requesting this medication be sent to the pharmacy. AS, CMA

## 2022-02-02 ENCOUNTER — Ambulatory Visit: Payer: Self-pay | Admitting: Urology

## 2022-02-12 ENCOUNTER — Encounter: Payer: Self-pay | Admitting: Internal Medicine

## 2022-02-24 ENCOUNTER — Ambulatory Visit (INDEPENDENT_AMBULATORY_CARE_PROVIDER_SITE_OTHER): Payer: 59 | Admitting: Nurse Practitioner

## 2022-02-24 ENCOUNTER — Other Ambulatory Visit: Payer: Self-pay

## 2022-02-24 ENCOUNTER — Encounter: Payer: Self-pay | Admitting: Nurse Practitioner

## 2022-02-24 VITALS — BP 136/75 | HR 78 | Temp 98.4°F | Ht 70.87 in | Wt 173.4 lb

## 2022-02-24 DIAGNOSIS — E1165 Type 2 diabetes mellitus with hyperglycemia: Secondary | ICD-10-CM

## 2022-02-24 DIAGNOSIS — E119 Type 2 diabetes mellitus without complications: Secondary | ICD-10-CM

## 2022-02-24 DIAGNOSIS — B3731 Acute candidiasis of vulva and vagina: Secondary | ICD-10-CM | POA: Diagnosis not present

## 2022-02-24 DIAGNOSIS — Z23 Encounter for immunization: Secondary | ICD-10-CM | POA: Diagnosis not present

## 2022-02-24 DIAGNOSIS — N3281 Overactive bladder: Secondary | ICD-10-CM

## 2022-02-24 LAB — POCT GLYCOSYLATED HEMOGLOBIN (HGB A1C): Hemoglobin A1C: 8.9 % — AB (ref 4.0–5.6)

## 2022-02-24 MED ORDER — SEGLUROMET 7.5-1000 MG PO TABS: 1.0000 | ORAL_TABLET | Freq: Two times a day (BID) | ORAL | 2 refills | Status: DC

## 2022-02-24 MED ORDER — FLUCONAZOLE 150 MG PO TABS: ORAL_TABLET | ORAL | 0 refills | Status: DC

## 2022-02-24 NOTE — Progress Notes (Signed)
Established patient visit ? ? ?Patient: Danielle Hurley   DOB: 1961/06/30   61 y.o. Female  MRN: [id number] ?Visit Date: 02/24/2022 ? ? ?Chief Complaint  ?Patient presents with  ? Follow-up  ? Diabetes  ? ?Subjective  ?  ?HPI  ?-Diabetes - check HgbA1c - very elevated today. It is 8.9 up from 7.1 at her last check. Has noted some negative side effects of taking SynJardy. This was changed due to insurance preference. It is definitively not controlling her sugar as well as sigluramet.  ?-?overactive bladder- has appointment with urology 03/02/2022.  ?-has new patient appointment with GI on 02/27/2022. ?-due to have 2nd shingles vaccine today  ?-health department to get initial COVID vaccines ?-vaginal itching and irritation. Similar to other yeast infection.  ? ? ? ?Medications: ?Outpatient Medications Prior to Visit  ?Medication Sig  ? aspirin-acetaminophen-caffeine (EXCEDRIN MIGRAINE) 250-250-65 MG tablet Take 1 tablet by mouth every 6 (six) hours as needed for headache.  ? atorvastatin (LIPITOR) 20 MG tablet Take 1 tablet (20 mg total) by mouth daily.  ? Bismuth Subsalicylate (KAOPECTATE) 262 MG TABS Take 262 mg by mouth daily as needed (upset stomach).  ? calcium carbonate (TUMS - DOSED IN MG ELEMENTAL CALCIUM) 500 MG chewable tablet Chew 1-2 tablets by mouth daily as needed for indigestion or heartburn.  ? cetirizine (ZYRTEC) 10 MG tablet Take 10 mg by mouth daily as needed for allergies.  ? Cholecalciferol (VITAMIN D) 50 MCG (2000 UT) tablet Take 2,000 Units by mouth daily.  ? diphenhydrAMINE (BENADRYL) 2 % cream Apply 1 application topically 3 (three) times daily as needed for itching.  ? glimepiride (AMARYL) 4 MG tablet Take 1 tablet (4 mg total) by mouth 2 (two) times daily.  ? glucose blood (TRUETRACK TEST) test strip Bloodsugar testing QD and prn  ? guaifenesin (HUMIBID E) 400 MG TABS tablet Take 400 mg by mouth daily as needed.  ? HYDROcodone-acetaminophen (NORCO/VICODIN) 5-325 MG tablet Take 0.5 tablets by mouth  every 6 (six) hours as needed for severe pain.  ? ibuprofen (ADVIL) 200 MG tablet Take 800 mg by mouth every 8 (eight) hours as needed for mild pain or moderate pain.  ? mupirocin ointment (BACTROBAN) 2 % Apply small amount to effected area twice daily for 2 weeks then as needed (Patient taking differently: Apply 1 application. topically 2 (two) times daily as needed (sore). Apply small amount to effected area twice daily for 2 weeks then as needed)  ? oxybutynin (DITROPAN-XL) 5 MG 24 hr tablet Take 1 tablet (5 mg total) by mouth at bedtime. (Patient not taking: Reported on 02/27/2022)  ? pantoprazole (PROTONIX) 40 MG tablet Take 1 tablet by mouth once daily  ? valsartan (DIOVAN) 160 MG tablet Take 160 mg by mouth daily.  ? [DISCONTINUED] Empagliflozin-metFORMIN HCl ER (SYNJARDY XR) 25-1000 MG TB24 Take 1 tablet by mouth daily.  ? [DISCONTINUED] famotidine (PEPCID) 20 MG tablet One after supper (Patient taking differently: Take 20 mg by mouth daily as needed for heartburn or indigestion.)  ? [DISCONTINUED] fluconazole (DIFLUCAN) 150 MG tablet Take 1 tablet po once. May repeat dose in 3 days as needed for persistent symptoms.  ? ?No facility-administered medications prior to visit.  ? ? ?Review of Systems  ?Constitutional:  Positive for fatigue. Negative for activity change, appetite change, chills and fever.  ?HENT:  Negative for congestion, postnasal drip, rhinorrhea, sinus pressure, sinus pain, sneezing and sore throat.   ?Eyes: Negative.   ?Respiratory:  Negative for cough, chest tightness,  shortness of breath and wheezing.   ?Cardiovascular:  Negative for chest pain and palpitations.  ?Gastrointestinal:  Negative for abdominal pain, constipation, diarrhea, nausea and vomiting.  ?     Has upcoming appointment with [facility]   ?Endocrine: Positive for cold intolerance. Negative for heat intolerance, polydipsia and polyuria.  ?     Blood sugars elevated since last visit. Had to switch oral medication due to  insurance preference. Having generalized joint pain and severe indigestion since medication change.   ?Genitourinary:  Positive for vaginal discharge and vaginal pain. Negative for dyspareunia, dysuria, flank pain, frequency and urgency.  ?     Vaginal itching and irritation   ?Musculoskeletal:  Positive for arthralgias, back pain and myalgias.  ?Skin:  Negative for rash.  ?     Upper arms feel cold. Even cold to the touch.   ?Allergic/Immunologic: Negative for environmental allergies.  ?Neurological:  Negative for dizziness, weakness and headaches.  ?Hematological:  Negative for adenopathy.  ?Psychiatric/Behavioral:  The patient is not nervous/anxious.   ? ? ? ? Objective  ?  ? ?Today's Vitals  ? 02/24/22 1503  ?BP: 136/75  ?Pulse: 78  ?Temp: 98.4 ?F (36.9 ?C)  ?SpO2: 97%  ?Weight: 173 lb 6.4 oz (78.7 kg)  ?Height: 5' 10.87" (1.8 m)  ? ?Body mass index is 24.28 kg/m?.  ? ?BP Readings from Last 3 Encounters:  ?02/27/22 (!) 152/72  ?02/24/22 136/75  ?11/26/21 136/73  ?  ?Wt Readings from Last 3 Encounters:  ?02/27/22 199 lb 3.2 oz (90.4 kg)  ?02/24/22 173 lb 6.4 oz (78.7 kg)  ?11/26/21 195 lb (88.5 kg)  ?  ?Physical Exam ?Vitals and nursing note reviewed.  ?Constitutional:   ?   Appearance: Normal appearance. She is well-developed.  ?HENT:  ?   Head: Normocephalic.  ?Eyes:  ?   Pupils: Pupils are equal, round, and reactive to light.  ?Cardiovascular:  ?   Rate and Rhythm: Normal rate and regular rhythm.  ?   Pulses: Normal pulses.  ?   Heart sounds: Normal heart sounds.  ?Pulmonary:  ?   Effort: Pulmonary effort is normal.  ?   Breath sounds: Normal breath sounds.  ?Abdominal:  ?   Palpations: Abdomen is soft.  ?Musculoskeletal:     ?   General: Normal range of motion.  ?   Cervical back: Normal range of motion and neck supple.  ?Lymphadenopathy:  ?   Cervical: No cervical adenopathy.  ?Skin: ?   General: Skin is warm and dry.  ?   Capillary Refill: Capillary refill takes less than 2 seconds.  ?Neurological:  ?    General: No focal deficit present.  ?   Mental Status: She is alert and oriented to person, place, and time.  ?Psychiatric:     ?   Mood and Affect: Mood normal.     ?   Behavior: Behavior normal.     ?   Thought Content: Thought content normal.     ?   Judgment: Judgment normal.  ?  ? ?Results for orders placed or performed in visit on 02/24/22  ?POCT glycosylated hemoglobin (Hb A1C)  ?Result Value Ref Range  ? Hemoglobin A1C 8.9 (A) 4.0 - 5.6 %  ? HbA1c POC (<> result, manual entry)    ? HbA1c, POC (prediabetic range)    ? HbA1c, POC (controlled diabetic range)    ? ? Assessment & Plan  ?  ?1. Type 2 diabetes mellitus with hyperglycemia, without  long-term current use of insulin (HCC) ?HgbA1c 8.9 today, up from 7.1 at most recent visit.  This is up significantly since change from Segluromet to [place].  We will try to switch back to Segluromet twice daily, continuing other diabetic medications as prescribed.  Recheck hemoglobin A1c in 3 months. ?- POCT glycosylated hemoglobin (Hb A1C) ? ?2. Vaginal yeast infection ?Diflucan to be taken once.  Repeat after 3 days for persistent symptoms. ?- fluconazole (DIFLUCAN) 150 MG tablet; Take 1 tablet po once. May repeat dose in 3 days as needed for persistent symptoms.  Dispense: 3 tablet; Refill: 0 ? ?3. Overactive bladder ?Patient has upcoming new patient appointment with urology. ? ?4. Need for shingles vaccine ?Second shingles vaccine administered during today's visit. ?- Varicella-zoster vaccine IM  ? ?Problem List Items Addressed This Visit   ? ?  ? Endocrine  ? Diabetes mellitus without complication (HCC) - Primary  ?  ? Genitourinary  ? Vaginal yeast infection  ? Relevant Medications  ? fluconazole (DIFLUCAN) 150 MG tablet  ? Overactive bladder  ? ?Other Visit Diagnoses   ? ? Need for shingles vaccine      ? Relevant Orders  ? Varicella-zoster vaccine IM (Completed)  ? ?  ?  ? ?Return in about 3 months (around 05/27/2022) for diabetes with HgbA1c check.  ?    ? ? ? ? ?Carlean Jews, NP  ?[facility] ?[phone] (phone) ?[phone] (fax) ? ?[facility]  ?

## 2022-02-27 ENCOUNTER — Ambulatory Visit: Payer: 59 | Admitting: Internal Medicine

## 2022-02-27 ENCOUNTER — Other Ambulatory Visit (INDEPENDENT_AMBULATORY_CARE_PROVIDER_SITE_OTHER): Payer: 59

## 2022-02-27 ENCOUNTER — Encounter: Payer: Self-pay | Admitting: Internal Medicine

## 2022-02-27 VITALS — BP 152/72 | Temp 99.0°F | Ht 70.5 in | Wt 199.2 lb

## 2022-02-27 DIAGNOSIS — R109 Unspecified abdominal pain: Secondary | ICD-10-CM

## 2022-02-27 DIAGNOSIS — R159 Full incontinence of feces: Secondary | ICD-10-CM

## 2022-02-27 DIAGNOSIS — R1310 Dysphagia, unspecified: Secondary | ICD-10-CM

## 2022-02-27 DIAGNOSIS — K7469 Other cirrhosis of liver: Secondary | ICD-10-CM

## 2022-02-27 DIAGNOSIS — B1920 Unspecified viral hepatitis C without hepatic coma: Secondary | ICD-10-CM

## 2022-02-27 LAB — IBC + FERRITIN
Ferritin: 14 ng/mL (ref 10.0–291.0)
Iron: 43 ug/dL (ref 42–145)
Saturation Ratios: 11.4 % — ABNORMAL LOW (ref 20.0–50.0)
TIBC: 378 ug/dL (ref 250.0–450.0)
Transferrin: 270 mg/dL (ref 212.0–360.0)

## 2022-02-27 NOTE — Progress Notes (Signed)
? ?Chief Complaint: Ab pain, possible cirrhosis ? ?HPI : 61 year old female with history of chronic hep C s/p treatment, COPD, DM, TB s/p treatment presents with abdominal pain and possible cirrhosis. ? ?Patient presents with RUQ ab pain that has been present for over a year.  She has noticed some issues with abdominal distention that also started last year.  Denies any lower extremity edema.  Denies any blood in her stools, diarrhea, confusion, constipation, nausea, vomiting.  She has dysphagia on occasion.  The dysphagia occurs mostly when food gets caught in the bottom of her neck.  This dysphagia has been going on for the last 1 to 2 months.  She does have some GERD issues for which she takes pantoprazole 40 mg QD. She has been having some issues with fecal incontinence. She has never had a colonoscopy in the past but has been getting Cologuard tests with her PCP.  She does not drink alcohol.  Denies family history of liver issues. ? ?Past Medical History:  ?Diagnosis Date  ? Arthritis   ? COPD (chronic obstructive pulmonary disease) ([place])   ? Diabetes mellitus without complication ([place])   ? Dyspnea   ? GERD (gastroesophageal reflux disease)   ? Hepatitis C   ? treated 2022  ? High cholesterol   ? History of kidney stones   ? Hypertension   ? Tuberculosis   ? non contagious Dx 05/2020  ? ? ? ?Past Surgical History:  ?Procedure Laterality Date  ? CHOLECYSTECTOMY  1998  ? TUBAL LIGATION  01/1995  ? VIDEO BRONCHOSCOPY WITH ENDOBRONCHIAL NAVIGATION N/A 09/04/2021  ? Procedure: VIDEO BRONCHOSCOPY WITH ENDOBRONCHIAL NAVIGATION;  Surgeon: Melrose Nakayama, MD;  Location: [place];  Service: Thoracic;  Laterality: N/A;  ? ?Family History  ?Adopted: Yes  ? ?Social History  ? ?Tobacco Use  ? Smoking status: Former  ?  Packs/day: 1.00  ?  Years: 40.00  ?  Pack years: 40.00  ?  Types: Cigarettes  ?  Quit date: 2014  ?  Years since quitting: 9.2  ? Smokeless tobacco: Never  ?Vaping Use  ? Vaping Use: Never used  ?Substance  Use Topics  ? Alcohol use: Not Currently  ?  Comment: occasional  ? Drug use: Not Currently  ? ?Current Outpatient Medications  ?Medication Sig Dispense Refill  ? aspirin-acetaminophen-caffeine (EXCEDRIN MIGRAINE) 250-250-65 MG tablet Take 1 tablet by mouth every 6 (six) hours as needed for headache.    ? atorvastatin (LIPITOR) 20 MG tablet Take 1 tablet (20 mg total) by mouth daily. 90 tablet 0  ? Bismuth Subsalicylate (KAOPECTATE) 262 MG TABS Take 262 mg by mouth daily as needed (upset stomach).    ? calcium carbonate (TUMS - DOSED IN MG ELEMENTAL CALCIUM) 500 MG chewable tablet Chew 1-2 tablets by mouth daily as needed for indigestion or heartburn.    ? cetirizine (ZYRTEC) 10 MG tablet Take 10 mg by mouth daily as needed for allergies.    ? Cholecalciferol (VITAMIN D) 50 MCG (2000 UT) tablet Take 2,000 Units by mouth daily.    ? diphenhydrAMINE (BENADRYL) 2 % cream Apply 1 application topically 3 (three) times daily as needed for itching.    ? fluconazole (DIFLUCAN) 150 MG tablet Take 1 tablet po once. May repeat dose in 3 days as needed for persistent symptoms. 3 tablet 0  ? glimepiride (AMARYL) 4 MG tablet Take 1 tablet (4 mg total) by mouth 2 (two) times daily. 180 tablet 1  ? glucose  blood (TRUETRACK TEST) test strip Bloodsugar testing QD and prn 100 each 12  ? guaifenesin (HUMIBID E) 400 MG TABS tablet Take 400 mg by mouth daily as needed.    ? HYDROcodone-acetaminophen (NORCO/VICODIN) 5-325 MG tablet Take 0.5 tablets by mouth every 6 (six) hours as needed for severe pain.    ? ibuprofen (ADVIL) 200 MG tablet Take 800 mg by mouth every 8 (eight) hours as needed for mild pain or moderate pain.    ? mupirocin ointment (BACTROBAN) 2 % Apply small amount to effected area twice daily for 2 weeks then as needed (Patient taking differently: Apply 1 application. topically 2 (two) times daily as needed (sore). Apply small amount to effected area twice daily for 2 weeks then as needed) 22 g 1  ? pantoprazole  (PROTONIX) 40 MG tablet Take 1 tablet by mouth once daily 90 tablet 0  ? valsartan (DIOVAN) 160 MG tablet Take 160 mg by mouth daily.    ? oxybutynin (DITROPAN-XL) 5 MG 24 hr tablet Take 1 tablet (5 mg total) by mouth at bedtime. (Patient not taking: Reported on 02/27/2022) 30 tablet 1  ? ?No current facility-administered medications for this visit.  ? ?No Known Allergies ? ? ?Review of Systems: ?All systems reviewed and negative except where noted in HPI.  ? ?Physical Exam: ?BP (!) 152/72   Temp 99 ?F (37.2 ?C)   Ht 5' 10.5" (1.791 m)   Wt 199 lb 3.2 oz (90.4 kg)   SpO2 97%   BMI 28.18 kg/m?  ?Constitutional: Pleasant,well-developed, female in no acute distress. ?HEENT: Normocephalic and atraumatic. Conjunctivae are normal. No scleral icterus. ?Cardiovascular: Normal rate, regular rhythm.  ?Pulmonary/chest: Effort normal and breath sounds normal. No wheezing, rales or rhonchi. ?Abdominal: Soft, nondistended, nontender. Bowel sounds active throughout. There are no masses palpable. No hepatomegaly. ?Extremities: No edema ?Neurological: Alert and oriented to person place and time. ?Skin: Skin is warm and dry. No rashes noted. ?Psychiatric: Normal mood and affect. Behavior is normal. ? ?Labs 03/2021: Hep B surface antigen NR, Hep A antibody positive ? ?Labs 11/2021: CBC nml, CMP with elevated Cr of 1.23. LFTs with elevated alk phos of 133 ? ?U/S with Elastography 11/17/21: ?IMPRESSION: ?ULTRASOUND ABDOMEN: ?Hepatic echotexture nodular hepatic contour suggestive of cirrhosis. ?No overt hepatic lesion visualized. ?ULTRASOUND HEPATIC ELASTOGRAPHY: ?Median kPa:  20 ?Diagnostic category: > or =17 kPa: highly suggestive of cACLD with ?an increased probability of clinically significant portal ?hypertension ? 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 Unclear that this time what is leading to her  symptoms. Will plan to start the patient on a daily fiber supplement to try to help with her fecal incontinence and regulate her BMs. She was found on recent elastography to have concern for possible cirrhosis. Will check some additional labs to rule out alternative etiologies of her elevated LFTs. Patient did describe some issues with dysphagia so will plan for EGD for further evaluation. ?- Check Hep B surface antibody, ferritin, iron/TIBC, ANA, ASMA, IgG ?- Start daily fiber supplmeent ?- Refer to nutritionist to encourage protein consumption 1-1.5 g/kg/day along with a carb controlled diet ?- Liver doppler 04/2022, already ordered ?- EGD L[facility] ?- Continue coffee consumption ?- Patient will continue to get Cologuard tests with her PCP ? ?Christia Reading, MD ? ?

## 2022-02-27 NOTE — Patient Instructions (Addendum)
Your provider has requested that you go to the basement level for lab work before leaving today. Press "B" on the elevator. The lab is located at the first door on the left as you exit the elevator. ? ?You have been scheduled for an endoscopy. Please follow written instructions given to you at your visit today. ?If you use inhalers (even only as needed), please bring them with you on the day of your procedure. ? ?A high fiber diet with plenty of fluids (up to 8 glasses of water daily) is suggested to relieve these symptoms.  Metamucil, 1 tablespoon once or twice daily can be used to keep bowels regular if needed. ? ? ?You have been referred to Nutrition, someone from their office will contact you to schedule. If you have not heard from them within 1-2 weeks please contact our office and let us know.  ? ?If you are age 9 or older, your body mass index should be between 23-30. Your Body mass index is 28.18 kg/m?Marland Kitchen If this is out of the aforementioned range listed, please consider follow up with your Primary Care Provider. ? ?If you are age 44 or younger, your body mass index should be between 19-25. Your Body mass index is 28.18 kg/m?Marland Kitchen If this is out of the aformentioned range listed, please consider follow up with your Primary Care Provider.  ? ?________________________________________________________ ? ?The [facility] providers would like to encourage you to use [facility] to communicate with providers for non-urgent requests or questions.  Due to long hold times on the telephone, sending your provider a message by [facility] may be a faster and more efficient way to get a response.  Please allow 48 business hours for a response.  Please remember that this is for non-urgent requests.  ?_______________________________________________________ ? ?Due to recent changes in healthcare laws, you may see the results of your imaging and laboratory studies on MyChart before your provider has had a chance to review them.  We  understand that in some cases there may be results that are confusing or concerning to you. Not all laboratory results come back in the same time frame and the provider may be waiting for multiple results in order to interpret others.  Please give [place] 48 hours in order for your provider to thoroughly review all the results before contacting the office for clarification of your results.  ? ?Thank you for choosing me and [facility]. ? ?Dr [name]  ? ? ?  ?

## 2022-03-01 MED ORDER — ERTUGLIFLOZIN-METFORMIN HCL 7.5-1000 MG PO TABS: 1.0000 | ORAL_TABLET | Freq: Two times a day (BID) | ORAL | 3 refills | Status: DC

## 2022-03-02 ENCOUNTER — Ambulatory Visit: Payer: 59 | Admitting: Urology

## 2022-03-02 VITALS — BP 164/80 | HR 79 | Ht 70.5 in | Wt 199.0 lb

## 2022-03-02 DIAGNOSIS — N3281 Overactive bladder: Secondary | ICD-10-CM

## 2022-03-02 LAB — URINALYSIS, COMPLETE
Bilirubin, UA: NEGATIVE
Ketones, UA: NEGATIVE
Leukocytes,UA: NEGATIVE
Nitrite, UA: NEGATIVE
Specific Gravity, UA: 1.01 (ref 1.005–1.030)
Urobilinogen, Ur: 0.2 mg/dL (ref 0.2–1.0)
pH, UA: 5.5 (ref 5.0–7.5)

## 2022-03-02 LAB — MICROSCOPIC EXAMINATION: Bacteria, UA: NONE SEEN

## 2022-03-02 LAB — BLADDER SCAN AMB NON-IMAGING: Scan Result: 75

## 2022-03-02 NOTE — Patient Instructions (Signed)

## 2022-03-02 NOTE — Progress Notes (Signed)
? ?03/02/2022 ?1:56 PM  ? ?Danielle Hurley ?12/06/1960 ?[id number] ? ?Referring provider: Carlean Jews, NP ?[address] ?[address] ?[place],  [place] [address] ? ?Chief Complaint  ?Patient presents with  ? Over Active Bladder  ? ? ?HPI: I was consulted to assess the patient for recurrent bladder infections and incontinence.  She gets approximately 2 bladder infections a year with pain burning and cloudy urine and sometimes bubbles that responds favorably to antibiotics. ? ?She can leak with coughing sneezing but probably not bending lifting.  I think she also has urge incontinence.  I do not think she has bedwetting.  When she is at home she does not wear a pad.  When she goes out she can wear 3 damp liners. ? ?She voids every 30 to 60 minutes and cannot hold it for 2 hours.  She is up 3 times a night ? ?She voids a small amount.  She will lean forward and empty a little bit more a reasonable amount.  He will often dribble ? ?She never took oxybutynin.  Has not had a hysterectomy.  Had a normal renal ultrasound November 17, 2021 ? ?Has not had bladder surgery.  She has passed a kidney stone.  She is on oral hypoglycemics.  Bowel function reasonably normal ? ?   ? ? ?PMH: ?Past Medical History:  ?Diagnosis Date  ? Arthritis   ? COPD (chronic obstructive pulmonary disease) (HCC)   ? Diabetes mellitus without complication (HCC)   ? Dyspnea   ? GERD (gastroesophageal reflux disease)   ? Hepatitis C   ? treated 2022  ? High cholesterol   ? History of kidney stones   ? Hypertension   ? Tuberculosis   ? non contagious Dx 05/2020  ? ? ?Surgical History: ?Past Surgical History:  ?Procedure Laterality Date  ? CHOLECYSTECTOMY  1998  ? TUBAL LIGATION  01/1995  ? VIDEO BRONCHOSCOPY WITH ENDOBRONCHIAL NAVIGATION N/A 09/04/2021  ? Procedure: VIDEO BRONCHOSCOPY WITH ENDOBRONCHIAL NAVIGATION;  Surgeon: Loreli Slot, MD;  Location: [facility];  Service: Thoracic;  Laterality: N/A;  ? ? ?Home Medications:  ?Allergies as of 03/02/2022    ?No Known Allergies ?  ? ?  ?Medication List  ?  ? ?  ? Accurate as of March 02, 2022  1:56 PM. If you have any questions, ask your nurse or doctor.  ?  ?  ? ?  ? ?aspirin-acetaminophen-caffeine 250-250-65 MG tablet ?Commonly known as: EXCEDRIN MIGRAINE ?Take 1 tablet by mouth every 6 (six) hours as needed for headache. ?  ?atorvastatin 20 MG tablet ?Commonly known as: LIPITOR ?Take 1 tablet (20 mg total) by mouth daily. ?  ?calcium carbonate 500 MG chewable tablet ?Commonly known as: TUMS - dosed in mg elemental calcium ?Chew 1-2 tablets by mouth daily as needed for indigestion or heartburn. ?  ?cetirizine 10 MG tablet ?Commonly known as: ZYRTEC ?Take 10 mg by mouth daily as needed for allergies. ?  ?diphenhydrAMINE 2 % cream ?Commonly known as: BENADRYL ?Apply 1 application topically 3 (three) times daily as needed for itching. ?  ?Ertugliflozin-metFORMIN HCl 7.03-999 MG Tabs ?Take 1 tablet by mouth 2 (two) times daily. ?  ?fluconazole 150 MG tablet ?Commonly known as: DIFLUCAN ?Take 1 tablet po once. May repeat dose in 3 days as needed for persistent symptoms. ?  ?glimepiride 4 MG tablet ?Commonly known as: AMARYL ?Take 1 tablet (4 mg total) by mouth 2 (two) times daily. ?  ?guaifenesin 400 MG Tabs tablet ?  Commonly known as: HUMIBID E ?Take 400 mg by mouth daily as needed. ?  ?HYDROcodone-acetaminophen 5-325 MG tablet ?Commonly known as: NORCO/VICODIN ?Take 0.5 tablets by mouth every 6 (six) hours as needed for severe pain. ?  ?ibuprofen 200 MG tablet ?Commonly known as: ADVIL ?Take 800 mg by mouth every 8 (eight) hours as needed for mild pain or moderate pain. ?  ?Kaopectate 262 MG Tabs ?Generic drug: Bismuth Subsalicylate ?Take 262 mg by mouth daily as needed (upset stomach). ?  ?mupirocin ointment 2 % ?Commonly known as: BACTROBAN ?Apply small amount to effected area twice daily for 2 weeks then as needed ?What changed:  ?how much to take ?how to take this ?when to take this ?reasons to take this ?   ?oxybutynin 5 MG 24 hr tablet ?Commonly known as: DITROPAN-XL ?Take 1 tablet (5 mg total) by mouth at bedtime. ?  ?pantoprazole 40 MG tablet ?Commonly known as: PROTONIX ?Take 1 tablet by mouth once daily ?  ?TrueTrack Test test strip ?Generic drug: glucose blood ?Bloodsugar testing QD and prn ?  ?valsartan 160 MG tablet ?Commonly known as: DIOVAN ?Take 160 mg by mouth daily. ?  ?Vitamin D 50 MCG (2000 UT) tablet ?Take 2,000 Units by mouth daily. ?  ? ?  ? ? ?Allergies: No Known Allergies ? ?Family History: ?Family History  ?Adopted: Yes  ? ? ?Social History:  reports that she quit smoking about 9 years ago. Her smoking use included cigarettes. She has a 40.00 pack-year smoking history. She has never used smokeless tobacco. She reports that she does not currently use alcohol. She reports that she does not currently use drugs. ? ?ROS: ?  ? ?  ? ?  ? ?  ? ?  ? ?  ? ?  ? ?  ? ?  ? ?  ? ?  ? ?  ? ?  ? ?Physical Exam: ?There were no vitals taken for this visit.  ?Constitutional:  Alert and oriented, No acute distress. ?HEENT:  AT, moist mucus membranes.  Trachea midline, no masses. ?Cardiovascular: No clubbing, cyanosis, or edema. ?Respiratory: Normal respiratory effort, no increased work of breathing. ?GI: Abdomen is soft, nontender, nondistended, no abdominal masses ?GU: On pelvic examination patient had small grade 2 or grade 1 cystocele that is asymptomatic.  It did not encroach on the bladder neck.  It did not descend further.  No stress incontinence.  Mild atrophy.  No rectocele ?Skin: No rashes, bruises or suspicious lesions. ?Lymph: No cervical or inguinal adenopathy. ?Neurologic: Grossly intact, no focal deficits, moving all 4 extremities. ?Psychiatric: Normal mood and affect. ? ?Laboratory Data: ?Lab Results  ?Component Value Date  ? WBC 7.9 12/02/2021  ? HGB 12.9 12/02/2021  ? HCT 37.8 12/02/2021  ? MCV 84 12/02/2021  ? PLT 233 12/02/2021  ? ? ?Lab Results  ?Component Value Date  ? CREATININE 1.23 (H)  12/02/2021  ? ? ?No results found for: PSA ? ?No results found for: TESTOSTERONE ? ?Lab Results  ?Component Value Date  ? HGBA1C 8.9 (A) 02/24/2022  ? ? ?Urinalysis ?   ?Component Value Date/Time  ? BILIRUBINUR Negative 03/31/2021 1131  ? PROTEINUR Negative 03/31/2021 1131  ? UROBILINOGEN 0.2 03/31/2021 1131  ? NITRITE Negative 03/31/2021 1131  ? LEUKOCYTESUR Negative 03/31/2021 1131  ? ? ?Pertinent Imaging: ?Urine reviewed.  Chart reviewed.  Urine sent for culture ?Bladder scan residual 75 mL ? ?Assessment & Plan: Patient has mixed incontinence.  She has flow symptoms.  She has  recurrent bladder infections.  Call if culture positive.  She has a very frequent bladder.  Patient is bothered by stress incontinence and nocturia.  I believe she has a lot of overactive bladder probably causing the flow symptoms.  The role of urodynamics and cystoscopy was discussed moving forward appropriate treatment goals will need to be considered.  She has mild stress incontinence with flow symptoms and I think a significant overactive bladder.  She is annoyed by her symptoms I will treat infections as needed ? ?1. Overactive bladder ? ?- Urinalysis, Complete ?- CULTURE, URINE COMPREHENSIVE; Future ? ? ?No follow-ups on file. ? ?Martina Sinner, MD ? ?[facility] ?[address] ?[place], [place] [address] ?[phone] ?  ?

## 2022-03-03 LAB — HEPATITIS B SURFACE ANTIBODY,QUALITATIVE: Hep B S Ab: REACTIVE — AB

## 2022-03-03 LAB — ANA: Anti Nuclear Antibody (ANA): NEGATIVE

## 2022-03-03 LAB — MITOCHONDRIAL ANTIBODIES: Mitochondrial M2 Ab, IgG: <=20 U (ref <=20.0)

## 2022-03-03 LAB — IGG: IgG (Immunoglobin G), Serum: 1500 mg/dL (ref 600–1540)

## 2022-03-03 LAB — ANTI-SMOOTH MUSCLE ANTIBODY, IGG: Actin (Smooth Muscle) Antibody (IGG): 21 U — ABNORMAL HIGH (ref <20)

## 2022-03-04 LAB — CULTURE, URINE COMPREHENSIVE

## 2022-03-11 ENCOUNTER — Encounter: Payer: Self-pay | Admitting: Internal Medicine

## 2022-03-11 ENCOUNTER — Ambulatory Visit (AMBULATORY_SURGERY_CENTER): Payer: 59 | Admitting: Internal Medicine

## 2022-03-11 VITALS — BP 114/62 | HR 73 | Temp 97.1°F | Resp 12 | Ht 70.0 in | Wt 199.0 lb

## 2022-03-11 DIAGNOSIS — R1310 Dysphagia, unspecified: Secondary | ICD-10-CM

## 2022-03-11 DIAGNOSIS — K2100 Gastro-esophageal reflux disease with esophagitis, without bleeding: Secondary | ICD-10-CM | POA: Diagnosis not present

## 2022-03-11 DIAGNOSIS — K219 Gastro-esophageal reflux disease without esophagitis: Secondary | ICD-10-CM

## 2022-03-11 DIAGNOSIS — K319 Disease of stomach and duodenum, unspecified: Secondary | ICD-10-CM | POA: Diagnosis not present

## 2022-03-11 DIAGNOSIS — K3189 Other diseases of stomach and duodenum: Secondary | ICD-10-CM | POA: Diagnosis not present

## 2022-03-11 MED ORDER — SODIUM CHLORIDE 0.9 % IV SOLN: 500.0000 mL | Freq: Once | INTRAVENOUS | Status: DC

## 2022-03-11 NOTE — Patient Instructions (Addendum)
Handouts Provided:  Post Dilation diet ? ?YOU HAD AN ENDOSCOPIC PROCEDURE TODAY AT THE [facility]:   Refer to the procedure report that was given to you for any specific questions about what was found during the examination.  If the procedure report does not answer your questions, please call your gastroenterologist to clarify.  If you requested that your care partner not be given the details of your procedure findings, then the procedure report has been included in a sealed envelope for you to review at your convenience later. ? ?YOU SHOULD EXPECT: Some feelings of bloating in the abdomen. Passage of more gas than usual.  Walking can help get rid of the air that was put into your GI tract during the procedure and reduce the bloating. If you had a lower endoscopy (such as a colonoscopy or flexible sigmoidoscopy) you may notice spotting of blood in your stool or on the toilet paper. If you underwent a bowel prep for your procedure, you may not have a normal bowel movement for a few days. ? ?Please Note:  You might notice some irritation and congestion in your nose or some drainage.  This is from the oxygen used during your procedure.  There is no need for concern and it should clear up in a day or so. ? ?SYMPTOMS TO REPORT IMMEDIATELY: ? ?Following upper endoscopy (EGD) ? Vomiting of blood or coffee ground material ? New chest pain or pain under the shoulder blades ? Painful or persistently difficult swallowing ? New shortness of breath ? Fever of 100?F or higher ? Black, tarry-looking stools ? ?For urgent or emergent issues, a gastroenterologist can be reached at any hour by calling [phone]. ?Do not use MyChart messaging for urgent concerns.  ? ? ?DIET:  Post dilation diet.  Drink plenty of fluids but you should avoid alcoholic beverages for 24 hours. ? ?ACTIVITY:  You should plan to take it easy for the rest of today and you should NOT DRIVE or use heavy machinery until tomorrow (because of the  sedation medicines used during the test).   ? ?FOLLOW UP: ?Our staff will call the number listed on your records 48-72 hours following your procedure to check on you and address any questions or concerns that you may have regarding the information given to you following your procedure. If we do not reach you, we will leave a message.  We will attempt to reach you two times.  During this call, we will ask if you have developed any symptoms of COVID 19. If you develop any symptoms (ie: fever, flu-like symptoms, shortness of breath, cough etc.) before then, please call [phone].  If you test positive for Covid 19 in the 2 weeks post procedure, please call and report this information to [place].   ? ?If any biopsies were taken you will be contacted by phone or by letter within the next 1-3 weeks.  Please call us at [phone] if you have not heard about the biopsies in 3 weeks.  ? ? ?SIGNATURES/CONFIDENTIALITY: ?You and/or your care partner have signed paperwork which will be entered into your electronic medical record.  These signatures attest to the fact that that the information above on your After Visit Summary has been reviewed and is understood.  Full responsibility of the confidentiality of this discharge information lies with you and/or your care-partner. ? ?

## 2022-03-11 NOTE — Progress Notes (Signed)
Pt non-responsive, VVS, Report to RN  °

## 2022-03-11 NOTE — Op Note (Signed)
[facility] ?Patient Name: Danielle Hurley ?Procedure Date: 03/11/2022 11:55 AM ?MRN: [id number] ?Endoscopist: Nicole Kindred "Eulah Pont ,  ?Age: 61 ?Referring MD:  ?Date of Birth: 1961/10/18 ?Gender: Female ?Account #: [id number] ?Procedure:                Upper GI endoscopy ?Indications:              Dysphagia ?Medicines:                Monitored Anesthesia Care ?Procedure:                Pre-Anesthesia Assessment: ?                          - Prior to the procedure, a History and Physical  ?                          was performed, and patient medications and  ?                          allergies were reviewed. The patient's tolerance of  ?                          previous anesthesia was also reviewed. The risks  ?                          and benefits of the procedure and the sedation  ?                          options and risks were discussed with the patient.  ?                          All questions were answered, and informed consent  ?                          was obtained. Prior Anticoagulants: The patient has  ?                          taken no previous anticoagulant or antiplatelet  ?                          agents. ASA Grade Assessment: III - A patient with  ?                          severe systemic disease. After reviewing the risks  ?                          and benefits, the patient was deemed in  ?                          satisfactory condition to undergo the procedure. ?                          After obtaining informed consent, the endoscope was  ?  passed under direct vision. Throughout the  ?                          procedure, the patient's blood pressure, pulse, and  ?                          oxygen saturations were monitored continuously. The  ?                          Endoscope was introduced through the mouth, and  ?                          advanced to the second part of duodenum. The upper  ?                          GI endoscopy was accomplished without  difficulty.  ?                          The patient tolerated the procedure well. ?Scope In: ?Scope Out: ?Findings:                 Biopsies were taken with a cold forceps in the  ?                          proximal esophagus and in the distal esophagus for  ?                          histology. ?                          One benign-appearing, intrinsic mild  ?                          (non-circumferential scarring) stenosis was found  ?                          at the gastroesophageal junction. The stenosis was  ?                          traversed. A TTS dilator was passed through the  ?                          scope. Dilation with an 18-19-20 mm balloon dilator  ?                          was performed to 20 mm. There was no heme  ?                          disruption at the end of the dilation. Biopsies  ?                          were taken with a cold forceps for histology. ?  Localized mildly erythematous mucosa without  ?                          bleeding was found in the gastric antrum. Biopsies  ?                          were taken with a cold forceps for histology. ?                          The examined duodenum was normal. ?Complications:            No immediate complications. ?Estimated Blood Loss:     Estimated blood loss was minimal. ?Impression:               - Benign-appearing esophageal stenosis. Dilated.  ?                          Biopsied. ?                          - Erythematous mucosa in the antrum. Biopsied. ?                          - Normal examined duodenum. ?                          - Biopsies were taken with a cold forceps for  ?                          histology in the proximal esophagus and in the  ?                          distal esophagus. ?Recommendation:           - Discharge patient to home (with escort). ?                          - Await pathology results. ?                          - Return to GI clinic in 3 months. ?                          -  The findings and recommendations were discussed  ?                          with the patient. ?Particia Lather,  ?03/11/2022 12:31:54 PM ?

## 2022-03-11 NOTE — Progress Notes (Signed)
Called to room to assist during endoscopic procedure.  Patient ID and intended procedure confirmed with present staff. Received instructions for my participation in the procedure from the performing physician.  

## 2022-03-11 NOTE — Progress Notes (Signed)
? ?GASTROENTEROLOGY PROCEDURE H&P NOTE  ? ?Primary Care Physician: ?Ronnell Freshwater, NP ? ? ? ?Reason for Procedure:   EGD ? 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 ?Patient is appropriate for endoscopic procedure(s) in the ambulatory ([place]) setting. ? ?The nature of the procedure, as well as the risks, benefits, and alternatives were carefully and thoroughly reviewed with the patient. Ample time for discussion and questions allowed. The patient understood, was satisfied, and agreed to proceed.  ? ? ? ?HPI: ?Danielle Hurley is a 61 y.o. female who presents for EGD for evaluation of dysphagia and possible HCV cirrhosis .  Patient was most recently seen in the Gastroenterology Clinic on 02/27/22.  No interval change in medical history since that appointment. Please refer to that note for full details regarding GI history and clinical presentation.  ? ?Past Medical History:  ?Diagnosis Date  ? Arthritis   ? COPD (chronic obstructive pulmonary disease) ([place])   ? Diabetes mellitus without complication ([facility])   ? Dyspnea   ? GERD (gastroesophageal reflux disease)   ? Hepatitis C   ? treated 2022  ? High cholesterol   ? History of kidney stones   ? Hypertension   ? Tuberculosis   ? non contagious Dx 05/2020  ? ? ?Past Surgical History:  ?Procedure Laterality Date  ? CHOLECYSTECTOMY  1998  ? TUBAL LIGATION  01/1995  ? VIDEO BRONCHOSCOPY WITH ENDOBRONCHIAL NAVIGATION N/A 09/04/2021  ? Procedure: VIDEO BRONCHOSCOPY WITH ENDOBRONCHIAL NAVIGATION;  Surgeon: Melrose Nakayama, MD;  Location: [facility];  Service: Thoracic;  Laterality: N/A;  ? ? ?Prior to Admission medications   ?Medication Sig Start Date End Date Taking? Authorizing Provider  ?aspirin-acetaminophen-caffeine (EXCEDRIN MIGRAINE) 250-250-65 MG tablet Take 1 tablet by mouth every 6 (six) hours as needed for headache.   Yes [provider]  ?atorvastatin (LIPITOR) 20 MG tablet Take 1 tablet (20 mg total) by mouth daily. 09/29/21  Yes Ronnell Freshwater, NP   ?Bismuth Subsalicylate (KAOPECTATE) 262 MG TABS Take 262 mg by mouth daily as needed (upset stomach).   Yes [provider]  ?cetirizine (ZYRTEC) 10 MG tablet Take 10 mg by mouth daily as needed for allergies.   Yes [provider]  ?Cholecalciferol (VITAMIN D) 50 MCG (2000 UT) tablet Take 2,000 Units by mouth daily.   Yes [provider]  ?Ertugliflozin-metFORMIN HCl 7.03-999 MG TABS Take 1 tablet by mouth 2 (two) times daily. 03/01/22  Yes Ronnell Freshwater, NP  ?fluconazole (DIFLUCAN) 150 MG tablet Take 1 tablet po once. May repeat dose in 3 days as needed for persistent symptoms. 02/24/22  Yes Boscia, Greer Ee, NP  ?glimepiride (AMARYL) 4 MG tablet Take 1 tablet (4 mg total) by mouth 2 (two) times daily. 10/20/21  Yes Ronnell Freshwater, NP  ?glucose blood (TRUETRACK TEST) test strip Bloodsugar testing QD and prn 11/02/21  Yes Boscia, Heather E, NP  ?pantoprazole (PROTONIX) 40 MG tablet Take 1 tablet by mouth once daily 01/05/22  Yes Boscia, Heather E, NP  ?valsartan (DIOVAN) 160 MG tablet Take 160 mg by mouth daily. 07/03/21  Yes [provider]  ?calcium carbonate (TUMS - DOSED IN MG ELEMENTAL CALCIUM) 500 MG chewable tablet Chew 1-2 tablets by mouth daily as needed for indigestion or heartburn.    [provider]  ?diphenhydrAMINE (BENADRYL) 2 % cream Apply 1 application topically 3 (three) times daily as needed for itching.    [provider]  ?guaifenesin (HUMIBID E) 400 MG  TABS tablet Take 400 mg by mouth daily as needed.    [provider]  ?HYDROcodone-acetaminophen (NORCO/VICODIN) 5-325 MG tablet Take 0.5 tablets by mouth every 6 (six) hours as needed for severe pain.    [provider]  ?ibuprofen (ADVIL) 200 MG tablet Take 800 mg by mouth every 8 (eight) hours as needed for mild pain or moderate pain.    [provider]  ?mupirocin ointment (BACTROBAN) 2 % Apply small amount to effected area twice daily for 2 weeks then as  needed ?Patient taking differently: Apply 1 application. topically 2 (two) times daily as needed (sore). Apply small amount to effected area twice daily for 2 weeks then as needed 07/08/21   Ronnell Freshwater, NP  ? ? ?Current Outpatient Medications  ?Medication Sig Dispense Refill  ? aspirin-acetaminophen-caffeine (EXCEDRIN MIGRAINE) 250-250-65 MG tablet Take 1 tablet by mouth every 6 (six) hours as needed for headache.    ? atorvastatin (LIPITOR) 20 MG tablet Take 1 tablet (20 mg total) by mouth daily. 90 tablet 0  ? Bismuth Subsalicylate (KAOPECTATE) 262 MG TABS Take 262 mg by mouth daily as needed (upset stomach).    ? cetirizine (ZYRTEC) 10 MG tablet Take 10 mg by mouth daily as needed for allergies.    ? Cholecalciferol (VITAMIN D) 50 MCG (2000 UT) tablet Take 2,000 Units by mouth daily.    ? Ertugliflozin-metFORMIN HCl 7.03-999 MG TABS Take 1 tablet by mouth 2 (two) times daily. 60 tablet 3  ? fluconazole (DIFLUCAN) 150 MG tablet Take 1 tablet po once. May repeat dose in 3 days as needed for persistent symptoms. 3 tablet 0  ? glimepiride (AMARYL) 4 MG tablet Take 1 tablet (4 mg total) by mouth 2 (two) times daily. 180 tablet 1  ? glucose blood (TRUETRACK TEST) test strip Bloodsugar testing QD and prn 100 each 12  ? pantoprazole (PROTONIX) 40 MG tablet Take 1 tablet by mouth once daily 90 tablet 0  ? valsartan (DIOVAN) 160 MG tablet Take 160 mg by mouth daily.    ? calcium carbonate (TUMS - DOSED IN MG ELEMENTAL CALCIUM) 500 MG chewable tablet Chew 1-2 tablets by mouth daily as needed for indigestion or heartburn.    ? diphenhydrAMINE (BENADRYL) 2 % cream Apply 1 application topically 3 (three) times daily as needed for itching.    ? guaifenesin (HUMIBID E) 400 MG TABS tablet Take 400 mg by mouth daily as needed.    ? HYDROcodone-acetaminophen (NORCO/VICODIN) 5-325 MG tablet Take 0.5 tablets by mouth every 6 (six) hours as needed for severe pain.    ? ibuprofen (ADVIL) 200 MG tablet Take 800 mg by mouth every 8  (eight) hours as needed for mild pain or moderate pain.    ? mupirocin ointment (BACTROBAN) 2 % Apply small amount to effected area twice daily for 2 weeks then as needed (Patient taking differently: Apply 1 application. topically 2 (two) times daily as needed (sore). Apply small amount to effected area twice daily for 2 weeks then as needed) 22 g 1  ? ?Current Facility-Administered Medications  ?Medication Dose Route Frequency Provider Last Rate Last Admin  ? 0.9 %  sodium chloride infusion  500 mL Intravenous Once Sharyn Creamer, MD      ? ? ?Allergies as of 03/11/2022  ? (No Known Allergies)  ? ? ?Family History  ?Adopted: Yes  ? ? ?Social History  ? ?Socioeconomic History  ? Marital status: Widowed  ?  Spouse name: Not on  file  ? Number of children: 4  ? Years of education: Not on file  ? Highest education level: 12th grade  ?Occupational History  ? Not on file  ?Tobacco Use  ? Smoking status: Former  ?  Packs/day: 1.00  ?  Years: 40.00  ?  Pack years: 40.00  ?  Types: Cigarettes  ?  Quit date: 2014  ?  Years since quitting: 9.2  ? Smokeless tobacco: Never  ?Vaping Use  ? Vaping Use: Never used  ?Substance and Sexual Activity  ? Alcohol use: Not Currently  ?  Comment: occasional  ? Drug use: Not Currently  ? Sexual activity: Not Currently  ?Other Topics Concern  ? Not on file  ?Social History Narrative  ? Not on file  ? ?Social Determinants of Health  ? ?Financial Resource Strain: Not on file  ?Food Insecurity: Not on file  ?Transportation Needs: Not on file  ?Physical Activity: Not on file  ?Stress: Not on file  ?Social Connections: Not on file  ?Intimate Partner Violence: Not on file  ? ? ?Physical Exam: ?Vital signs in last 24 hours: ?BP 134/66   Pulse 78   Temp (!) 97.1 ?F (36.2 ?C)   Ht 5\' 10"  (1.778 m)   Wt 199 lb (90.3 kg)   SpO2 96%   BMI 28.55 kg/m?  ?GEN: NAD ?EYE: Sclerae anicteric ?ENT: MMM ?CV: Non-tachycardic ?Pulm: No increased WOB ?GI: Soft ?NEURO:  Alert & Oriented ? ? ?Christia Reading,  MD ?[facility] ? ? ?03/11/2022 11:14 AM ? ?

## 2022-03-13 ENCOUNTER — Telehealth: Payer: Self-pay | Admitting: *Deleted

## 2022-03-13 NOTE — Telephone Encounter (Signed)
First follow up call attempt.  LVM. 

## 2022-03-13 NOTE — Telephone Encounter (Signed)
?  Follow up Call- ? ? ?  03/11/2022  ? 11:06 AM  ?Call back number  ?Post procedure Call Back phone  # [phone]  ?Permission to leave phone message Yes  ?  ? ?Patient questions: ? ?Do you have a fever, pain , or abdominal swelling? No. ?Pain Score  0 * ? ?Have you tolerated food without any problems? Yes.   ? ?Have you been able to return to your normal activities? Yes.   ? ?Do you have any questions about your discharge instructions: ?Diet   No. ?Medications  No. ?Follow up visit  No. ? ?Do you have questions or concerns about your Care? No. ? ?Actions: ?* If pain score is 4 or above: ?No action needed, pain <4. ? ? ?

## 2022-03-16 ENCOUNTER — Encounter: Payer: Self-pay | Admitting: Internal Medicine

## 2022-03-22 ENCOUNTER — Other Ambulatory Visit: Payer: Self-pay | Admitting: Nurse Practitioner

## 2022-03-22 DIAGNOSIS — E1169 Type 2 diabetes mellitus with other specified complication: Secondary | ICD-10-CM

## 2022-04-04 ENCOUNTER — Other Ambulatory Visit: Payer: Self-pay | Admitting: Nurse Practitioner

## 2022-04-12 ENCOUNTER — Other Ambulatory Visit: Payer: Self-pay | Admitting: Nurse Practitioner

## 2022-04-12 DIAGNOSIS — E119 Type 2 diabetes mellitus without complications: Secondary | ICD-10-CM

## 2022-04-13 ENCOUNTER — Ambulatory Visit: Payer: 59 | Admitting: Urology

## 2022-04-13 ENCOUNTER — Encounter: Payer: Self-pay | Admitting: Urology

## 2022-04-13 VITALS — BP 153/83 | HR 77 | Ht 70.0 in | Wt 199.0 lb

## 2022-04-13 DIAGNOSIS — N3281 Overactive bladder: Secondary | ICD-10-CM

## 2022-04-13 DIAGNOSIS — Z87440 Personal history of urinary (tract) infections: Secondary | ICD-10-CM | POA: Diagnosis not present

## 2022-04-13 DIAGNOSIS — N3946 Mixed incontinence: Secondary | ICD-10-CM | POA: Diagnosis not present

## 2022-04-13 LAB — URINALYSIS, COMPLETE
Bilirubin, UA: NEGATIVE
Ketones, UA: NEGATIVE
Leukocytes,UA: NEGATIVE
Nitrite, UA: NEGATIVE
Protein,UA: NEGATIVE
RBC, UA: NEGATIVE
Specific Gravity, UA: 1.01 (ref 1.005–1.030)
Urobilinogen, Ur: 0.2 mg/dL (ref 0.2–1.0)
pH, UA: 5.5 (ref 5.0–7.5)

## 2022-04-13 LAB — MICROSCOPIC EXAMINATION
Bacteria, UA: NONE SEEN
Epithelial Cells (non renal): NONE SEEN /hpf (ref 0–10)

## 2022-04-13 MED ORDER — MIRABEGRON ER 50 MG PO TB24: 50.0000 mg | ORAL_TABLET | Freq: Every day | ORAL | 11 refills | Status: DC

## 2022-04-13 NOTE — Progress Notes (Signed)
? ?04/13/2022 ?2:13 PM  ? ?Danielle Hurley ?01/02/1961 ?[id number] ? ?Referring provider: Carlean Jews, NP ?[address] ?[address] ?[place],  [place] [address] ? ?Chief Complaint  ?Patient presents with  ? Cysto  ? ? ?HPI: ?I was consulted to assess the patient for recurrent bladder infections and incontinence.  She gets approximately 2 bladder infections a year with pain burning and cloudy urine and sometimes bubbles that responds favorably to antibiotics. ?  ?She can leak with coughing sneezing but probably not bending lifting.  I think she also has urge incontinence.  I do not think she has bedwetting.  When she is at home she does not wear a pad.  When she goes out she can wear 3 damp liners. ?  ?She voids every 30 to 60 minutes and cannot hold it for 2 hours.  She is up 3 times a night ? ?She voids a small amount.  She will lean forward and empty a little bit more a reasonable amount.  He will often dribble ?  ?She never took oxybutynin.  Has not had a hysterectomy.  Had a normal renal ultrasound November 17, 2021   ? ?On pelvic examination patient had small grade 2 or grade 1 cystocele that is asymptomatic.  It did not encroach on the bladder neck.  It did not descend further.  No stress incontinence.  Mild atrophy.  No rectocele ? ?Patient has mixed incontinence.  She has flow symptoms.  She has recurrent bladder infections.  Call if culture positive.  She has a very frequent bladder.  Patient is bothered by stress incontinence and nocturia.  I believe she has a lot of overactive bladder probably causing the flow symptoms.  The role of urodynamics and cystoscopy was discussed moving forward appropriate treatment goals will need to be considered.  She has mild stress incontinence with flow symptoms and I think a significant overactive bladder.  She is annoyed by her symptoms I will treat infections as needed ? ?Today ?Frequency stable.  Incontinence stable.  Patient never had urodynamics.  Last urine culture was  negative ? ?For some reason patient had not been called for urodynamics. ? ?Patient underwent flexible cystoscopy.  Bladder mucosa and trigone were normal.  No cystitis.  No carcinoma.  I looked around the bladder multiple times and within normal limits.  Tolerated it well ? ? ?PMH: ?Past Medical History:  ?Diagnosis Date  ? Arthritis   ? COPD (chronic obstructive pulmonary disease) (HCC)   ? Diabetes mellitus without complication (HCC)   ? Dyspnea   ? GERD (gastroesophageal reflux disease)   ? Hepatitis C   ? treated 2022  ? High cholesterol   ? History of kidney stones   ? Hypertension   ? Tuberculosis   ? non contagious Dx 05/2020  ? ? ?Surgical History: ?Past Surgical History:  ?Procedure Laterality Date  ? CHOLECYSTECTOMY  1998  ? TUBAL LIGATION  01/1995  ? VIDEO BRONCHOSCOPY WITH ENDOBRONCHIAL NAVIGATION N/A 09/04/2021  ? Procedure: VIDEO BRONCHOSCOPY WITH ENDOBRONCHIAL NAVIGATION;  Surgeon: Loreli Slot, MD;  Location: [facility] OR;  Service: Thoracic;  Laterality: N/A;  ? ? ?Home Medications:  ?Allergies as of 04/13/2022   ?No Known Allergies ?  ? ?  ?Medication List  ?  ? ?  ? Accurate as of Apr 13, 2022  2:13 PM. If you have any questions, ask your nurse or doctor.  ?  ?  ? ?  ? ?STOP taking these medications   ? ?  Ertugliflozin-metFORMIN HCl 7.03-999 MG Tabs ?Stopped by: Martina Sinner, MD ?  ? ?  ? ?TAKE these medications   ? ?aspirin-acetaminophen-caffeine 250-250-65 MG tablet ?Commonly known as: EXCEDRIN MIGRAINE ?Take 1 tablet by mouth every 6 (six) hours as needed for headache. ?  ?atorvastatin 20 MG tablet ?Commonly known as: LIPITOR ?Take 1 tablet by mouth once daily ?  ?calcium carbonate 500 MG chewable tablet ?Commonly known as: TUMS - dosed in mg elemental calcium ?Chew 1-2 tablets by mouth daily as needed for indigestion or heartburn. ?  ?cetirizine 10 MG tablet ?Commonly known as: ZYRTEC ?Take 10 mg by mouth daily as needed for allergies. ?  ?diphenhydrAMINE 2 % cream ?Commonly known as:  BENADRYL ?Apply 1 application topically 3 (three) times daily as needed for itching. ?  ?fluconazole 150 MG tablet ?Commonly known as: DIFLUCAN ?Take 1 tablet po once. May repeat dose in 3 days as needed for persistent symptoms. ?  ?glimepiride 4 MG tablet ?Commonly known as: AMARYL ?Take 1 tablet by mouth twice daily ?  ?guaifenesin 400 MG Tabs tablet ?Commonly known as: HUMIBID E ?Take 400 mg by mouth daily as needed. ?  ?HYDROcodone-acetaminophen 5-325 MG tablet ?Commonly known as: NORCO/VICODIN ?Take 0.5 tablets by mouth every 6 (six) hours as needed for severe pain. ?  ?ibuprofen 200 MG tablet ?Commonly known as: ADVIL ?Take 800 mg by mouth every 8 (eight) hours as needed for mild pain or moderate pain. ?  ?Kaopectate 262 MG Tabs ?Generic drug: Bismuth Subsalicylate ?Take 262 mg by mouth daily as needed (upset stomach). ?  ?mupirocin ointment 2 % ?Commonly known as: BACTROBAN ?Apply small amount to effected area twice daily for 2 weeks then as needed ?What changed:  ?how much to take ?how to take this ?when to take this ?reasons to take this ?  ?pantoprazole 40 MG tablet ?Commonly known as: PROTONIX ?Take 1 tablet by mouth once daily ?  ?Synjardy XR 25-1000 MG Tb24 ?Generic drug: Empagliflozin-metFORMIN HCl ER ?Take 1 tablet by mouth daily. ?  ?TrueTrack Test test strip ?Generic drug: glucose blood ?Bloodsugar testing QD and prn ?  ?valsartan 160 MG tablet ?Commonly known as: DIOVAN ?Take 160 mg by mouth daily. ?  ?Vitamin D 50 MCG (2000 UT) tablet ?Take 2,000 Units by mouth daily. ?  ? ?  ? ? ?Allergies: No Known Allergies ? ?Family History: ?Family History  ?Adopted: Yes  ? ? ?Social History:  reports that she quit smoking about 9 years ago. Her smoking use included cigarettes. She has a 40.00 pack-year smoking history. She has never used smokeless tobacco. She reports that she does not currently use alcohol. She reports that she does not currently use drugs. ? ?ROS: ?  ? ?  ? ?  ? ?  ? ?  ? ?  ? ?  ? ?  ? ?   ? ?  ? ?  ? ?  ? ?  ? ?Physical Exam: ?BP (!) 153/83   Pulse 77   Ht 5\' 10"  (1.778 m)   Wt 90.3 kg   BMI 28.55 kg/m?   ?Constitutional:  Alert and oriented, No acute distress. ? ? ?Laboratory Data: ?Lab Results  ?Component Value Date  ? WBC 7.9 12/02/2021  ? HGB 12.9 12/02/2021  ? HCT 37.8 12/02/2021  ? MCV 84 12/02/2021  ? PLT 233 12/02/2021  ? ? ?Lab Results  ?Component Value Date  ? CREATININE 1.23 (H) 12/02/2021  ? ? ?No results found for: PSA ? ?  No results found for: TESTOSTERONE ? ?Lab Results  ?Component Value Date  ? HGBA1C 8.9 (A) 02/24/2022  ? ? ?Urinalysis ?   ?Component Value Date/Time  ? APPEARANCEUR Clear 03/02/2022 1354  ? GLUCOSEU 3+ (A) 03/02/2022 1354  ? BILIRUBINUR Negative 03/02/2022 1354  ? PROTEINUR Trace (A) 03/02/2022 1354  ? UROBILINOGEN 0.2 03/31/2021 1131  ? NITRITE Negative 03/02/2022 1354  ? LEUKOCYTESUR Negative 03/02/2022 1354  ? ? ?Pertinent Imaging: ? ? ?Assessment & Plan: Schedule patient for urodynamic.  Because of the delay I thought was reasonable to give her some Myrbetriq samples and prescription. ? ?1. Overactive bladder ? ?- Urinalysis, Complete ? ? ?No follow-ups on file. ? ?Martina SinnerScott A Zeanna Sunde, MD ? ?[facility] ?[address], [address] ?[place], [place] [address] ?[phone]) [phone] ?  ?

## 2022-05-09 ENCOUNTER — Other Ambulatory Visit: Payer: Self-pay | Admitting: Internal Medicine

## 2022-05-09 ENCOUNTER — Other Ambulatory Visit: Payer: Self-pay | Admitting: Nurse Practitioner

## 2022-05-12 ENCOUNTER — Telehealth: Payer: Self-pay | Admitting: Nurse Practitioner

## 2022-05-12 ENCOUNTER — Other Ambulatory Visit: Payer: Self-pay | Admitting: Nurse Practitioner

## 2022-05-12 ENCOUNTER — Other Ambulatory Visit: Payer: Self-pay | Admitting: Internal Medicine

## 2022-05-12 DIAGNOSIS — I152 Hypertension secondary to endocrine disorders: Secondary | ICD-10-CM

## 2022-05-12 MED ORDER — VALSARTAN 160 MG PO TABS: 160.0000 mg | ORAL_TABLET | Freq: Every day | ORAL | 0 refills | Status: DC

## 2022-05-12 NOTE — Telephone Encounter (Signed)
Renewed prescription for valsartan and sent to [facility] [address].  

## 2022-05-12 NOTE — Progress Notes (Signed)
Renewed prescription for valsartan and sent to [facility] drive.

## 2022-05-12 NOTE — Telephone Encounter (Signed)
Patient is requesting refill of Valsartan. Please advise.

## 2022-05-13 NOTE — Telephone Encounter (Signed)
Pt is advised of Rx that was sent  

## 2022-05-18 ENCOUNTER — Other Ambulatory Visit: Payer: 59

## 2022-05-19 ENCOUNTER — Encounter: Payer: Self-pay | Admitting: Infectious Diseases

## 2022-05-19 ENCOUNTER — Other Ambulatory Visit: Payer: Self-pay

## 2022-05-19 ENCOUNTER — Ambulatory Visit: Payer: 59 | Admitting: Infectious Diseases

## 2022-05-19 VITALS — BP 113/73 | HR 80 | Resp 16 | Ht 70.0 in | Wt 199.9 lb

## 2022-05-19 DIAGNOSIS — B182 Chronic viral hepatitis C: Secondary | ICD-10-CM

## 2022-05-19 DIAGNOSIS — Z129 Encounter for screening for malignant neoplasm, site unspecified: Secondary | ICD-10-CM | POA: Insufficient documentation

## 2022-05-19 NOTE — Progress Notes (Addendum)
[facility]                                                             [address], [place], [place], [address]                                                                  Phn. 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 No granulomas and malignancy on path. Cultures- fungal stain and cx no growth in 21 days, Diptheroids+, AFB smear negative, AFB cx no growth to date  - Follows with Pulmonary   # HCC screening   Plan US abdomen has been ordered by PCP, pending Will need [facility] abdomen [facility], can be arranged with PCP.  Follow up with Pulmonary as instructed for COPD Follow up with us as needed   All questions and concerns were discussed and addressed. Patient verbalized understanding of the plan. ____________________________________________________________________________________________________________________  HPI: 61 year old female with PMH of DM, HTN, HLD, TB on tx with [facility] who is referred for evaluation and management of Hepatitis C. She was diagnosed with Hepatitis C in 2008 but she did not find it out until 2014 when her girlfriend saw it on on of her medical records. 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 Her ex-husband also had Hepatitis C ( unsure if it was treated). Denies h/o blood transfusion, has tattoos in her  body. Denies any personal or family h/o liver diseases or Liver cancer. Denies tx to date. She is interested for Hepatitis C treatment.   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 She was on RIPE tx since feb 2021. She says she was on home isolation until October 2021 and was released from isolation precautions at that time. [place] and continued to follow with [facility] for her TB care. She has been currently  taking Isoniazid/Pyridoxine and Rifampin. 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 She has been tolerating her ATT well without any issues.   She complains of diarrhea for few years. She says she has loose stool at least three times a week. Denies any hematochezia, pain or burning. Denies nausea/vomiting. Abdominal pain is occasiona. Denies cough, chest pain and SOB. Denies GU symptoms. Denies rashes or joint complaints.  Denies recent hospital admission for liver diseases.  She is seeing an Orthopedics for tear in her rt arm and also has been referred to see a pulomonologist.   Social - says she is adopted, Quit smoking 8 years ago, alcohol once in a while. 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 Has not been sexually active for 8 years. She lives with her brother who is deaf and takes care of her. She has 1 adult son and 3 adult daughters.  05/13/21 Here for follow-up for hepatitis C. denies any complaints.  She has been off pulmonary TB treatment since May 03, 2021 and was told that she has completed treatment for pulmonary TB.  She does not have a follow-up with [facility] department anymore.  She was seen by pulmonary for evaluation of her chronic cough Her recent chest x-ray Showed persistent areas of nodularity and cavitation in the left lung  with air-fluid level suggesting superinfection of the pre-existing cavity.  It was recommended to repeat sputum AFB culture off treatment and possible need of FOB for localized bronchiectasis with likelihood of reinfection. she denies any fevers, chills or night sweats.  Denies any chest pain or shortness of breath.  Appetite is good and has gained 4 pounds in the last few months.   Discussed with her regarding her past hepatitis C labs and ultrasound abdomen findings.  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  She is not immune to hepatitis B and will vaccinate her against hepatitis B. I will also check for serology for hepatitis A. she drinks wine occasionally.  Denies smoking and using other illicit drugs.  06/13/21 Taking Mavyret 3 tablets a day without missing any doses. She is on her 4th week . Denies any side effects with the mavyret. She has cough and it is productive of clear sputum. She has been using Mucinex DM which has been helping with the cough. Denies any chest pain and SOB. Appetite is good and thinks she has gained weight. Her last sputum AFB cx from 6/27 ha sno growth so far. She is also closely following with Pulmonary. Discussed with her to collect one more sputum AFB cx and follow up in 4-6 weeks at the end of her hep C tx.   07/22/21 Here for end of treatment follow up for Hepatitis C. She completed her tx with Mavyret for 8 weeks, last dose on 07/17/21. Denies missing any doses. She will be switching Rosuvastatin back to Atorvastatin as she has completed her Mavyret course. Discussed about her HCC screening with [place] every 6 months or so given her fibrosis score of F3.  Patient is also being followed by Dr Sherene Sires for left upper lobe cavitary lesion which seems to have been present at the time of end of tx for pulmonary TB ( as per Chest xray done on 5/27/2). She has completed 2 weeks course of Augmentin on 8/15 and had a  chest Xray done on 8/16 which showed Left upper lobe cavity with  increased air-fluid level compared to most recent prior, concerning for worsening superimposed infection.  She denies any fevers, chills, sweats. Continues to cough, mostly dry and sometimes with minimal white phlegm. She can walk one quarter of a mile without getting SOB. She does not like to take stairs as it makes her SOB. 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 Denies any nausea, vomiting, abdominal pain and diarrhea. Appetite is good and denies weight loss.   10/21/21 Here for follow up for Hepatitis C. She has completed 8 weeks course of Mavyret, denies missed doses. She was seen by CT surgery Dr Dorris Fetch recently and had (09/04/21) Electromagnetic navigational bronchoscopy with brushings, needle aspirations and transbronchial biopsy. No granulomas and malignancy on path. Cultures are negative so far. After the bronch, she had 2-3 times where she coughed up bloody streak in her phlegm ( last one 3 days ago). She coughs mostly at night and produces minimal whitish phlegm. Denies any purulence. Denies any fevers, chills, sweats and chest pain. Appetite is good and tells me she has gained almost 33lbs of weight. She is considering to start exercising. She is able to do her household activities without any SOB. She is able to climb stairs without any issues and walks around her home which is 1/5th of a mile on a daily bases. She has received flu vaccine.  05/19/22 Here for follow up for Hepatitis C. She has completed 8 weeks course of Mavyret and has achieved SVR in 09/2021. US abdomen 10/2021 concerning for cirrhosis. She was scheduled to have US abdomen done by PCP yesterday at [facility] but it was not covered by her insurance. She is asking her PCP to refer to another place which is covered by her insurance. Denies any SOB, cough once in a while  with mild mild phlegm production. Appetite is good and thinks she has gained weight. Denies nausea, vomiting and diarrhea.   ROS: ROS done with pertinent positives and negative as listed above   Past Medical History:  Diagnosis Date   Arthritis    COPD (chronic obstructive pulmonary disease) (HCC)    Diabetes mellitus without complication (HCC)    Dyspnea    GERD (gastroesophageal reflux disease)    Hepatitis C    treated 2022   High cholesterol    History of kidney stones    Hypertension    Tuberculosis    non contagious Dx 05/2020    Past Surgical History:  Procedure Laterality Date   CHOLECYSTECTOMY  1998   TUBAL LIGATION  01/1995   VIDEO BRONCHOSCOPY WITH ENDOBRONCHIAL NAVIGATION N/A 09/04/2021   Procedure: VIDEO BRONCHOSCOPY WITH ENDOBRONCHIAL NAVIGATION;  Surgeon: Loreli Slot, MD;  Location: [facility] OR;  Service: Thoracic;  Laterality: N/A;   Current Outpatient Medications on File Prior to Visit  Medication Sig Dispense Refill   aspirin-acetaminophen-caffeine (EXCEDRIN MIGRAINE) 250-250-65 MG tablet Take 1 tablet by mouth every 6 (six) hours as needed for headache.     atorvastatin (LIPITOR) 20 MG tablet Take 1 tablet by mouth once daily 90 tablet 0   Bismuth Subsalicylate (KAOPECTATE) 262 MG TABS Take 262 mg by mouth daily as needed (upset stomach).     calcium carbonate (TUMS - DOSED IN MG ELEMENTAL CALCIUM) 500 MG chewable tablet Chew 1-2 tablets by mouth daily as needed for indigestion or heartburn.     cetirizine (ZYRTEC) 10 MG tablet Take 10 mg by  mouth daily as needed for allergies.     Cholecalciferol (VITAMIN D) 50 MCG (2000 UT) tablet Take 2,000 Units by mouth daily.     diphenhydrAMINE (BENADRYL) 2 % cream Apply 1 application topically 3 (three) times daily as needed for itching.     fluconazole (DIFLUCAN) 150 MG tablet Take 1 tablet po once. May repeat dose in 3 days as needed for persistent symptoms. 3 tablet 0   glimepiride (AMARYL) 4 MG tablet Take 1  tablet by mouth twice daily 180 tablet 0   glucose blood (TRUETRACK TEST) test strip Bloodsugar testing QD and prn 100 each 12   guaifenesin (HUMIBID E) 400 MG TABS tablet Take 400 mg by mouth daily as needed.     HYDROcodone-acetaminophen (NORCO/VICODIN) 5-325 MG tablet Take 0.5 tablets by mouth every 6 (six) hours as needed for severe pain.     ibuprofen (ADVIL) 200 MG tablet Take 800 mg by mouth every 8 (eight) hours as needed for mild pain or moderate pain.     mirabegron ER (MYRBETRIQ) 50 MG TB24 tablet Take 1 tablet (50 mg total) by mouth daily. 30 tablet 11   mupirocin ointment (BACTROBAN) 2 % Apply small amount to effected area twice daily for 2 weeks then as needed (Patient taking differently: Apply 1 application  topically 2 (two) times daily as needed (sore). Apply small amount to effected area twice daily for 2 weeks then as needed) 22 g 1   pantoprazole (PROTONIX) 40 MG tablet Take 1 tablet by mouth once daily 90 tablet 0   SYNJARDY XR 25-1000 MG TB24 Take 1 tablet by mouth once daily 30 tablet 0   valsartan (DIOVAN) 160 MG tablet Take 1 tablet (160 mg total) by mouth daily. 90 tablet 0   No current facility-administered medications on file prior to visit.    No Known Allergies  Social History   Socioeconomic History   Marital status: Widowed    Spouse name: Not on file   Number of children: 4   Years of education: Not on file   Highest education level: 12th grade  Occupational History   Not on file  Tobacco Use   Smoking status: Former    Packs/day: 1.00    Years: 40.00    Total pack years: 40.00    Types: Cigarettes    Quit date: 2014    Years since quitting: 9.4   Smokeless tobacco: Never  Vaping Use   Vaping Use: Never used  Substance and Sexual Activity   Alcohol use: Not Currently    Comment: occasional   Drug use: Not Currently   Sexual activity: Not Currently  Other Topics Concern   Not on file  Social History Narrative   Not on file   Social  Determinants of Health   Financial Resource Strain: Not on file  Food Insecurity: Not on file  Transportation Needs: Not on file  Physical Activity: Not on file  Stress: Not on file  Social Connections: Not on file  Intimate Partner Violence: Not on file    Vitals BP 113/73   Pulse 80   Resp 16   Ht 5\' 10"  (1.778 m)   Wt 199 lb 14.4 oz (90.7 kg)   SpO2 96%   BMI 28.68 kg/m    Examination  General - not in acute distress, comfortably sitting in chair HEENT - PEERLA, no pallor and no icterus Chest - b/l clear air entry, no additional sounds CVS- Normal s1s2, RRR Abdomen - Soft,  Non tender , non distended Ext- no pedal edema Neuro: grossly normal Psych : calm and cooperative   Pertinent Lab    Latest Ref Rng & Units 12/02/2021   10:20 AM 08/18/2021    9:01 AM 04/08/2021    3:50 PM  CBC  WBC 3.4 - 10.8 x10E3/uL 7.9  8.9  7.3   Hemoglobin 11.1 - 15.9 g/dL 45.4  09.8  11.9   Hematocrit 34.0 - 46.6 % 37.8  41.6  39.4   Platelets 150 - 450 x10E3/uL 233  214  201       Latest Ref Rng & Units 12/02/2021   10:20 AM 08/18/2021    9:01 AM 07/22/2021    4:11 PM  CMP  Glucose 70 - 99 mg/dL 147  829    BUN 8 - 27 mg/dL 27  25    Creatinine 5.62 - 1.00 mg/dL 1.30  8.65    Sodium 784 - 144 mmol/L 142  138    Potassium 3.5 - 5.2 mmol/L 4.0  4.0    Chloride 96 - 106 mmol/L 103  104    CO2 20 - 29 mmol/L 22  24    Calcium 8.7 - 10.3 mg/dL 9.5  9.5    Total Protein 6.0 - 8.5 g/dL 7.7  8.3    Total Bilirubin 0.0 - 1.2 mg/dL 0.4  0.6    Alkaline Phos 44 - 121 IU/L 133  82    AST 0 - 40 IU/L 17  20    ALT 0 - 32 IU/L Pertinent Microbiology  Results for orders placed or performed in visit on 04/13/22  Microscopic Examination     Status: None   Collection Time: 04/13/22  1:41 PM   Urine  Result Value Ref Range Status   WBC, UA 0-5 0 - 5 /hpf Final   RBC 0-2 0 - 2 /hpf Final   Epithelial Cells (non renal) None seen 0 - 10 /hpf Final   Bacteria, UA None seen None  seen/Few Final      Pertinent Pathology  FINAL MICROSCOPIC DIAGNOSIS:   A. LUNG, LEFT UPPER LOBE, BIOPSY:  - Lung tissue with infiltrate showing crush artifact.  - See comment.   B. LUNG, LEFT LOWER LOBE, BIOPSY:  - Benign lung tissue.  - No granulomas or malignancy.   Pertinent Imaging All pertinent labs/Imagings/notes reviewed. All pertinent plain films and CT images have been personally visualized and interpreted; radiology reports have been reviewed. Decision making incorporated into the Impression / Recommendations.  Chest Xray 09/02/21 IMPRESSION: 1. No acute cardiopulmonary disease. 2. Lung masses and nodules stable compared to the recent prior exams.  Chest Xray 08/18/21 IMPRESSION: No significant change in the appearance of the chest x-ray, without acute cardiopulmonary disease.  US abdomen w elastography 04/14/21   IMPRESSION: ULTRASOUND ABDOMEN:   Coarsened hepatic echotexture raising the question of liver disease.   Post cholecystectomy.   No acute findings in the abdomen with renal cysts.   ULTRASOUND HEPATIC ELASTOGRAPHY:   Median kPa:  6.0   Diagnostic category: < or = 9 kPa: in the absence of other known clinical signs, rules out cACLD IQR to median kilopascals ratio at 0.3 which may indicate reduced accuracy of current data. Sonographic window is challenging based on assessment of submitted images and more than 1 site was assessed. Best data was reported.   I have spent 40 minutes for this patient encounter including  review  of prior medical records with greater than 50% of time in face to face counsel of the patient/discussing diagnostics and plan of care.   Electronically signed by:  Odette Fraction, MD Infectious Disease Physician [facility] [address] [place], [place] [address] Phone: [phone]  Fax: [phone]

## 2022-05-27 ENCOUNTER — Encounter: Payer: Self-pay | Admitting: Nurse Practitioner

## 2022-05-27 ENCOUNTER — Ambulatory Visit (INDEPENDENT_AMBULATORY_CARE_PROVIDER_SITE_OTHER): Payer: 59 | Admitting: Nurse Practitioner

## 2022-05-27 VITALS — BP 109/72 | HR 82 | Temp 97.1°F | Ht 70.08 in | Wt 198.4 lb

## 2022-05-27 DIAGNOSIS — E1165 Type 2 diabetes mellitus with hyperglycemia: Secondary | ICD-10-CM

## 2022-05-27 DIAGNOSIS — R319 Hematuria, unspecified: Secondary | ICD-10-CM | POA: Diagnosis not present

## 2022-05-27 DIAGNOSIS — E1159 Type 2 diabetes mellitus with other circulatory complications: Secondary | ICD-10-CM

## 2022-05-27 DIAGNOSIS — K219 Gastro-esophageal reflux disease without esophagitis: Secondary | ICD-10-CM | POA: Diagnosis not present

## 2022-05-27 DIAGNOSIS — E119 Type 2 diabetes mellitus without complications: Secondary | ICD-10-CM

## 2022-05-27 DIAGNOSIS — K7460 Unspecified cirrhosis of liver: Secondary | ICD-10-CM

## 2022-05-27 DIAGNOSIS — N390 Urinary tract infection, site not specified: Secondary | ICD-10-CM | POA: Diagnosis not present

## 2022-05-27 DIAGNOSIS — I152 Hypertension secondary to endocrine disorders: Secondary | ICD-10-CM

## 2022-05-27 DIAGNOSIS — M064 Inflammatory polyarthropathy: Secondary | ICD-10-CM

## 2022-05-27 DIAGNOSIS — E785 Hyperlipidemia, unspecified: Secondary | ICD-10-CM

## 2022-05-27 DIAGNOSIS — E1169 Type 2 diabetes mellitus with other specified complication: Secondary | ICD-10-CM

## 2022-05-27 LAB — POCT URINALYSIS DIP (CLINITEK)
Bilirubin, UA: NEGATIVE
Glucose, UA: >=1000 mg/dL — AB
Leukocytes, UA: NEGATIVE
Nitrite, UA: NEGATIVE
POC PROTEIN,UA: 30 — AB
Spec Grav, UA: 1.01 (ref 1.010–1.025)
Urobilinogen, UA: 0.2 U/dL
pH, UA: 5.5 (ref 5.0–8.0)

## 2022-05-27 LAB — POCT GLYCOSYLATED HEMOGLOBIN (HGB A1C): Hemoglobin A1C: 10 % — AB (ref 4.0–5.6)

## 2022-05-27 MED ORDER — PANTOPRAZOLE SODIUM 40 MG PO TBEC: 40.0000 mg | DELAYED_RELEASE_TABLET | Freq: Every day | ORAL | 1 refills | Status: DC

## 2022-05-27 MED ORDER — VALSARTAN 160 MG PO TABS: 160.0000 mg | ORAL_TABLET | Freq: Every day | ORAL | 0 refills | Status: DC

## 2022-05-27 MED ORDER — NITROFURANTOIN MONOHYD MACRO 100 MG PO CAPS: 100.0000 mg | ORAL_CAPSULE | Freq: Two times a day (BID) | ORAL | 0 refills | Status: DC

## 2022-05-27 MED ORDER — NAPROXEN 500 MG PO TABS: 500.0000 mg | ORAL_TABLET | Freq: Two times a day (BID) | ORAL | 2 refills | Status: DC | PRN

## 2022-05-27 MED ORDER — ATORVASTATIN CALCIUM 20 MG PO TABS: 20.0000 mg | ORAL_TABLET | Freq: Every day | ORAL | 1 refills | Status: DC

## 2022-05-27 MED ORDER — SITAGLIPTIN PHOSPHATE 50 MG PO TABS: 50.0000 mg | ORAL_TABLET | Freq: Two times a day (BID) | ORAL | 3 refills | Status: DC

## 2022-05-27 MED ORDER — METFORMIN HCL 1000 MG PO TABS: 1000.0000 mg | ORAL_TABLET | Freq: Two times a day (BID) | ORAL | 3 refills | Status: DC

## 2022-05-27 MED ORDER — GLIMEPIRIDE 4 MG PO TABS: 4.0000 mg | ORAL_TABLET | Freq: Two times a day (BID) | ORAL | 1 refills | Status: DC

## 2022-05-27 NOTE — Progress Notes (Signed)
Established patient visit   Patient: Danielle Hurley   DOB: [birth year]   61 y.o. Female  MRN: [id number] Visit Date: 05/27/2022   Chief Complaint  Patient presents with   Diabetes   Subjective    HPI  Type 2 diabetes  -insurance would not cover change back too segluromet. Should continue to be on synjardy. Blood sugars were much higher at last check with HgbA1c 8.9. -HgbA1c today 10.0  -having urinary frequency and vaginal itching.  -has seen urology. Did have cystoscopy with no problems with bladder walls. She was given prescription for myrbetriq. She has not made a follow up with this provider to discuss continued sensation of not emptying bladder completely. New prescription started in May.  -chronic back pain. Does get shooting pain into left arm and left leg. Pain most severe in groin. Does have history of sciatica. Would like to try prescription for naproxen     Medications: Outpatient Medications Prior to Visit  Medication Sig   aspirin-acetaminophen-caffeine (EXCEDRIN MIGRAINE) 250-250-65 MG tablet Take 1 tablet by mouth every 6 (six) hours as needed for headache.   Bismuth Subsalicylate (KAOPECTATE) 262 MG TABS Take 262 mg by mouth daily as needed (upset stomach).   calcium carbonate (TUMS - DOSED IN MG ELEMENTAL CALCIUM) 500 MG chewable tablet Chew 1-2 tablets by mouth daily as needed for indigestion or heartburn.   cetirizine (ZYRTEC) 10 MG tablet Take 10 mg by mouth daily as needed for allergies.   Cholecalciferol (VITAMIN D) 50 MCG (2000 UT) tablet Take 2,000 Units by mouth daily.   diphenhydrAMINE (BENADRYL) 2 % cream Apply 1 application topically 3 (three) times daily as needed for itching.   fluconazole (DIFLUCAN) 150 MG tablet Take 1 tablet po once. May repeat dose in 3 days as needed for persistent symptoms.   glucose blood (TRUETRACK TEST) test strip Bloodsugar testing QD and prn   guaifenesin (HUMIBID E) 400 MG TABS tablet Take 400 mg by mouth daily as needed.    HYDROcodone-acetaminophen (NORCO/VICODIN) 5-325 MG tablet Take 0.5 tablets by mouth every 6 (six) hours as needed for severe pain.   mirabegron ER (MYRBETRIQ) 50 MG TB24 tablet Take 1 tablet (50 mg total) by mouth daily.   mupirocin ointment (BACTROBAN) 2 % Apply small amount to effected area twice daily for 2 weeks then as needed (Patient taking differently: Apply 1 application  topically 2 (two) times daily as needed (sore). Apply small amount to effected area twice daily for 2 weeks then as needed)   [DISCONTINUED] atorvastatin (LIPITOR) 20 MG tablet Take 1 tablet by mouth once daily   [DISCONTINUED] glimepiride (AMARYL) 4 MG tablet Take 1 tablet by mouth twice daily   [DISCONTINUED] ibuprofen (ADVIL) 200 MG tablet Take 800 mg by mouth every 8 (eight) hours as needed for mild pain or moderate pain.   [DISCONTINUED] pantoprazole (PROTONIX) 40 MG tablet Take 1 tablet by mouth once daily   [DISCONTINUED] SYNJARDY XR 25-1000 MG TB24 Take 1 tablet by mouth once daily   [DISCONTINUED] valsartan (DIOVAN) 160 MG tablet Take 1 tablet (160 mg total) by mouth daily.   No facility-administered medications prior to visit.    Review of Systems  Constitutional:  Positive for fatigue. Negative for activity change, appetite change, chills and fever.  HENT:  Negative for congestion, postnasal drip, rhinorrhea, sinus pressure, sinus pain, sneezing and sore throat.   Eyes: Negative.   Respiratory:  Negative for cough, chest tightness, shortness of breath and wheezing.   Cardiovascular:  Negative  for chest pain and palpitations.  Gastrointestinal:  Negative for abdominal pain, constipation, diarrhea, nausea and vomiting.  Endocrine: Positive for polydipsia and polyuria. Negative for cold intolerance and heat intolerance.       Blood sugars very elevated   Genitourinary:  Positive for frequency and urgency. Negative for dyspareunia, dysuria and flank pain.  Musculoskeletal:  Positive for arthralgias, back pain  and myalgias.  Skin:  Negative for rash.  Allergic/Immunologic: Negative for environmental allergies.  Neurological:  Negative for dizziness, weakness and headaches.  Hematological:  Negative for adenopathy.  Psychiatric/Behavioral:  The patient is nervous/anxious.        Objective     Today's Vitals   05/27/22 1431  BP: 109/72  Pulse: 82  Temp: (!) 97.1 F (36.2 C)  SpO2: 97%  Weight: 198 lb 6.4 oz (90 kg)  Height: 5' 10.08" (1.78 m)   Body mass index is 28.4 kg/m.  BP Readings from Last 3 Encounters:  05/27/22 109/72  05/19/22 113/73  04/13/22 (!) 153/83    Wt Readings from Last 3 Encounters:  05/27/22 198 lb 6.4 oz (90 kg)  05/19/22 199 lb 14.4 oz (90.7 kg)  04/13/22 199 lb (90.3 kg)    Physical Exam Vitals and nursing note reviewed.  Constitutional:      Appearance: Normal appearance. She is well-developed.  HENT:     Head: Normocephalic and atraumatic.     Nose: Nose normal.     Mouth/Throat:     Mouth: Mucous membranes are moist.     Pharynx: Oropharynx is clear.  Eyes:     Extraocular Movements: Extraocular movements intact.     Conjunctiva/sclera: Conjunctivae normal.     Pupils: Pupils are equal, round, and reactive to light.  Neck:     Vascular: No carotid bruit.  Cardiovascular:     Rate and Rhythm: Normal rate and regular rhythm.     Pulses: Normal pulses.     Heart sounds: Normal heart sounds.  Pulmonary:     Effort: Pulmonary effort is normal.     Breath sounds: Normal breath sounds.  Abdominal:     General: Bowel sounds are normal. There is no distension.     Palpations: Abdomen is soft. There is no mass.     Tenderness: There is abdominal tenderness. There is right CVA tenderness. There is no left CVA tenderness, guarding or rebound.     Hernia: No hernia is present.  Genitourinary:    Comments: Urine sample positive for protein, glucose, and trace blood.  Musculoskeletal:        General: Normal range of motion.     Cervical back:  Normal range of motion and neck supple.  Lymphadenopathy:     Cervical: No cervical adenopathy.  Skin:    General: Skin is warm and dry.     Capillary Refill: Capillary refill takes less than 2 seconds.  Neurological:     General: No focal deficit present.     Mental Status: She is alert and oriented to person, place, and time.  Psychiatric:        Mood and Affect: Mood normal.        Behavior: Behavior normal.        Thought Content: Thought content normal.        Judgment: Judgment normal.     Results for orders placed or performed in visit on 05/27/22  Urine Culture   Specimen: Urine   Urine  Result Value Ref Range   Urine  Culture, Routine Final report    Organism ID, Bacteria No growth   POCT glycosylated hemoglobin (Hb A1C)  Result Value Ref Range   Hemoglobin A1C 10.0 (A) 4.0 - 5.6 %   HbA1c POC (<> result, manual entry)     HbA1c, POC (prediabetic range)     HbA1c, POC (controlled diabetic range)    POCT URINALYSIS DIP (CLINITEK)  Result Value Ref Range   Color, UA yellow yellow   Clarity, UA clear clear   Glucose, UA >=1,000 (A) negative mg/dL   Bilirubin, UA negative negative   Ketones, POC UA trace (5) (A) negative mg/dL   Spec Grav, UA 1.610 9.604 - 1.025   Blood, UA trace-intact (A) negative   pH, UA 5.5 5.0 - 8.0   POC PROTEIN,UA =30 (A) negative, trace   Urobilinogen, UA 0.2 0.2 or 1.0 E.U./dL   Nitrite, UA Negative Negative   Leukocytes, UA Negative Negative    Assessment & Plan    1. Type 2 diabetes mellitus with hyperglycemia, without long-term current use of insulin (HCC) HgbA1c 10.0 today. Increase dose metformin to 1000 mg twice daily. Continue glimepiride 4 mg twice daily. Add Januvia 50 mg twice daily. Limit intake  of carbohydrates and sugar. Increase intake of water. Check blood sugars daily. The goal is to have fasting blood sugars between 70 and 100.  - POCT glycosylated hemoglobin (Hb A1C) - metFORMIN (GLUCOPHAGE) 1000 MG tablet; Take 1  tablet (1,000 mg total) by mouth 2 (two) times daily with a meal.  Dispense: 180 tablet; Refill: 3 - sitaGLIPtin (JANUVIA) 50 MG tablet; Take 1 tablet (50 mg total) by mouth 2 (two) times daily.  Dispense: 60 tablet; Refill: 3 - glimepiride (AMARYL) 4 MG tablet; Take 1 tablet (4 mg total) by mouth 2 (two) times daily.  Dispense: 180 tablet; Refill: 1   2. Urinary tract infection with hematuria, site unspecified Treat with macrobid 100 mg twice daily for 7 days.  Send urine for culture and sensitivity and adjust treatment as indicated. - POCT URINALYSIS DIP (CLINITEK) - Urine Culture; Future - Urine Culture - nitrofurantoin, macrocrystal-monohydrate, (MACROBID) 100 MG capsule; Take 1 capsule (100 mg total) by mouth 2 (two) times daily.  Dispense: 14 capsule; Refill: 0  3. Hepatic cirrhosis, unspecified hepatic cirrhosis type, unspecified whether ascites present [facility]) New order for abdominal ultrasound placed today. - US Abdomen Complete; Future  4. Gastroesophageal reflux disease without esophagitis Continue pantoprazole daily - pantoprazole (PROTONIX) 40 MG tablet; Take 1 tablet (40 mg total) by mouth daily.  Dispense: 90 tablet; Refill: 1  5. Hypertension associated with diabetes (HCC) Blood pressure stable.  Continue all blood pressure medications as prescribed. - valsartan (DIOVAN) 160 MG tablet; Take 1 tablet (160 mg total) by mouth daily.  Dispense: 90 tablet; Refill: 0  6. Inflammatory polyarthritis (HCC) May take naproxen 500 mg up to twice daily as needed. - naproxen (NAPROSYN) 500 MG tablet; Take 1 tablet (500 mg total) by mouth 2 (two) times daily as needed.  Dispense: 60 tablet; Refill: 2  7. Hyperlipidemia associated with type 2 diabetes mellitus (HCC) Continue atorvastatin 20 mg daily. - atorvastatin (LIPITOR) 20 MG tablet; Take 1 tablet (20 mg total) by mouth daily.  Dispense: 90 tablet; Refill: 1   Problem List Items Addressed This Visit       Cardiovascular and  Mediastinum   Hypertension associated with diabetes (HCC)   Relevant Medications   metFORMIN (GLUCOPHAGE) 1000 MG tablet   sitaGLIPtin (JANUVIA) 50 MG  tablet   atorvastatin (LIPITOR) 20 MG tablet   glimepiride (AMARYL) 4 MG tablet   valsartan (DIOVAN) 160 MG tablet     Digestive   Hepatic cirrhosis (HCC)   Relevant Orders   US Abdomen Complete   Gastroesophageal reflux disease without esophagitis   Relevant Medications   pantoprazole (PROTONIX) 40 MG tablet     Endocrine   Diabetes mellitus without complication (HCC)   Relevant Medications   metFORMIN (GLUCOPHAGE) 1000 MG tablet   sitaGLIPtin (JANUVIA) 50 MG tablet   atorvastatin (LIPITOR) 20 MG tablet   glimepiride (AMARYL) 4 MG tablet   valsartan (DIOVAN) 160 MG tablet   Hyperlipidemia associated with type 2 diabetes mellitus (HCC)   Relevant Medications   metFORMIN (GLUCOPHAGE) 1000 MG tablet   sitaGLIPtin (JANUVIA) 50 MG tablet   atorvastatin (LIPITOR) 20 MG tablet   glimepiride (AMARYL) 4 MG tablet   valsartan (DIOVAN) 160 MG tablet   Type 2 diabetes mellitus with hyperglycemia, without long-term current use of insulin (HCC) - Primary   Relevant Medications   metFORMIN (GLUCOPHAGE) 1000 MG tablet   sitaGLIPtin (JANUVIA) 50 MG tablet   atorvastatin (LIPITOR) 20 MG tablet   glimepiride (AMARYL) 4 MG tablet   valsartan (DIOVAN) 160 MG tablet   Other Relevant Orders   POCT glycosylated hemoglobin (Hb A1C) (Completed)     Musculoskeletal and Integument   Inflammatory polyarthritis (HCC)   Relevant Medications   naproxen (NAPROSYN) 500 MG tablet     Genitourinary   Urinary tract infection with hematuria   Relevant Medications   nitrofurantoin, macrocrystal-monohydrate, (MACROBID) 100 MG capsule   Other Relevant Orders   POCT URINALYSIS DIP (CLINITEK) (Completed)   Urine Culture (Completed)     Return in about 3 months (around 08/27/2022) for diabetes with HgbA1c check.         Carlean Jews, NP  [facility]  [facility] at [facility] [phone] (phone) [phone] (fax)  [facility]

## 2022-05-29 LAB — URINE CULTURE: Organism ID, Bacteria: NO GROWTH

## 2022-05-29 NOTE — Progress Notes (Signed)
Patient was treated with macrobid at time of visit for 5 days based on symptoms and in-house u/a

## 2022-05-31 DIAGNOSIS — E1165 Type 2 diabetes mellitus with hyperglycemia: Secondary | ICD-10-CM | POA: Insufficient documentation

## 2022-05-31 DIAGNOSIS — N390 Urinary tract infection, site not specified: Secondary | ICD-10-CM | POA: Insufficient documentation

## 2022-05-31 DIAGNOSIS — M064 Inflammatory polyarthropathy: Secondary | ICD-10-CM | POA: Insufficient documentation

## 2022-05-31 DIAGNOSIS — K219 Gastro-esophageal reflux disease without esophagitis: Secondary | ICD-10-CM | POA: Insufficient documentation

## 2022-06-10 ENCOUNTER — Ambulatory Visit: Payer: 59 | Admitting: Internal Medicine

## 2022-07-08 NOTE — Progress Notes (Signed)
Established patient visit   Patient: Danielle Hurley   DOB: Dec 22, 1960   61 y.o. Female  MRN: [id number] Visit Date: 07/09/2022   Chief Complaint  Patient presents with   Follow-up   Subjective    HPI  Follow up visit  -type 2 diabetes with significant elevation of HgbA1c at most recent visit . -added Januvia 50 mg daily -  -HgbA1c 9.5 today, down from 10.0, though this was only one month ago  -continued metformin and glimepiride twice daily  History of cirrhosis if liver  -ordered new abdominal u/s last visit which has not been performed yet  Stable blood pressure.  -patient having bilateral chest pain. Pain is worse with deep breaths and worse when she is laying down.  -having leg cramps and foot pain which is so severe, it interferes with her ability to sleep. More on the left side than right, however happens on both sides  -moderate low back pain which radiates into the left hip and left upper leg. She describes left leg pain as burning and "tingling.' Gets so bad that she has significant limp, especially when on her feet for extended periods of time.   Medications: Outpatient Medications Prior to Visit  Medication Sig   aspirin-acetaminophen-caffeine (EXCEDRIN MIGRAINE) 250-250-65 MG tablet Take 1 tablet by mouth every 6 (six) hours as needed for headache.   atorvastatin (LIPITOR) 20 MG tablet Take 1 tablet (20 mg total) by mouth daily.   Bismuth Subsalicylate (KAOPECTATE) 262 MG TABS Take 262 mg by mouth daily as needed (upset stomach).   calcium carbonate (TUMS - DOSED IN MG ELEMENTAL CALCIUM) 500 MG chewable tablet Chew 1-2 tablets by mouth daily as needed for indigestion or heartburn.   cetirizine (ZYRTEC) 10 MG tablet Take 10 mg by mouth daily as needed for allergies.   Cholecalciferol (VITAMIN D) 50 MCG (2000 UT) tablet Take 2,000 Units by mouth daily.   diphenhydrAMINE (BENADRYL) 2 % cream Apply 1 application topically 3 (three) times daily as needed for itching.    fluconazole (DIFLUCAN) 150 MG tablet Take 1 tablet po once. May repeat dose in 3 days as needed for persistent symptoms.   glimepiride (AMARYL) 4 MG tablet Take 1 tablet (4 mg total) by mouth 2 (two) times daily.   glucose blood (TRUETRACK TEST) test strip Bloodsugar testing QD and prn   guaifenesin (HUMIBID E) 400 MG TABS tablet Take 400 mg by mouth daily as needed.   metFORMIN (GLUCOPHAGE) 1000 MG tablet Take 1 tablet (1,000 mg total) by mouth 2 (two) times daily with a meal.   mirabegron ER (MYRBETRIQ) 50 MG TB24 tablet Take 1 tablet (50 mg total) by mouth daily.   mupirocin ointment (BACTROBAN) 2 % Apply small amount to effected area twice daily for 2 weeks then as needed (Patient taking differently: Apply 1 application  topically 2 (two) times daily as needed (sore). Apply small amount to effected area twice daily for 2 weeks then as needed)   nitrofurantoin, macrocrystal-monohydrate, (MACROBID) 100 MG capsule Take 1 capsule (100 mg total) by mouth 2 (two) times daily.   pantoprazole (PROTONIX) 40 MG tablet Take 1 tablet (40 mg total) by mouth daily.   sitaGLIPtin (JANUVIA) 50 MG tablet Take 1 tablet (50 mg total) by mouth 2 (two) times daily.   valsartan (DIOVAN) 160 MG tablet Take 1 tablet (160 mg total) by mouth daily.   [DISCONTINUED] HYDROcodone-acetaminophen (NORCO/VICODIN) 5-325 MG tablet Take 0.5 tablets by mouth every 6 (six) hours as needed for severe  pain.   [DISCONTINUED] naproxen (NAPROSYN) 500 MG tablet Take 1 tablet (500 mg total) by mouth 2 (two) times daily as needed.   No facility-administered medications prior to visit.    Review of Systems  Constitutional:  Positive for fatigue. Negative for activity change, appetite change, chills and fever.  HENT:  Negative for congestion, postnasal drip, rhinorrhea, sinus pressure, sinus pain, sneezing and sore throat.   Eyes: Negative.   Respiratory:  Positive for shortness of breath. Negative for cough, chest tightness and wheezing.    Cardiovascular:  Positive for chest pain and palpitations.  Gastrointestinal:  Negative for abdominal pain, constipation, diarrhea, nausea and vomiting.  Endocrine: Negative for cold intolerance, heat intolerance, polydipsia and polyuria.       Slight improvement in blood sugar  Genitourinary:  Negative for dyspareunia, dysuria, flank pain, frequency and urgency.  Musculoskeletal:  Positive for arthralgias, back pain and myalgias.       Low back pain which radiates  to left buttock and left groin. Walks with a limp, favoring the left side because pain is so severe.   Skin:  Negative for rash.  Allergic/Immunologic: Negative for environmental allergies.  Neurological:  Negative for dizziness, weakness and headaches.  Hematological:  Negative for adenopathy.  Psychiatric/Behavioral:  The patient is not nervous/anxious.       Objective     Today's Vitals   07/09/22 1400  BP: 130/76  Pulse: 82  SpO2: 98%  Weight: 204 lb 6.4 oz (92.7 kg)  Height: 5' 10.08" (1.78 m)   Body mass index is 29.26 kg/m.   BP Readings from Last 3 Encounters:  07/13/22 (!) 157/82  07/09/22 130/76  05/27/22 109/72    Wt Readings from Last 3 Encounters:  07/13/22 198 lb (89.8 kg)  07/09/22 204 lb 6.4 oz (92.7 kg)  05/27/22 198 lb 6.4 oz (90 kg)    Physical Exam Vitals and nursing note reviewed.  Constitutional:      Appearance: Normal appearance. She is well-developed.  HENT:     Head: Normocephalic and atraumatic.     Nose: Nose normal.     Mouth/Throat:     Mouth: Mucous membranes are moist.     Pharynx: Oropharynx is clear.  Eyes:     Extraocular Movements: Extraocular movements intact.     Conjunctiva/sclera: Conjunctivae normal.     Pupils: Pupils are equal, round, and reactive to light.  Cardiovascular:     Rate and Rhythm: Normal rate and regular rhythm.     Pulses: Normal pulses.     Heart sounds: Normal heart sounds.     Comments: Normal sinus rhythm with septal  defect of  undetermined age. This has changed since her most recent ECG.  Pulmonary:     Effort: Pulmonary effort is normal.     Breath sounds: Normal breath sounds.  Abdominal:     Palpations: Abdomen is soft.  Musculoskeletal:        General: Normal range of motion.     Cervical back: Normal range of motion and neck supple.     Comments: Low back pain with palpation. Left buttock pain also present. Hurts most when standing from a seated position. No palpable abnormalities  or deformities  are noted at this time.   Lymphadenopathy:     Cervical: No cervical adenopathy.  Skin:    General: Skin is warm and dry.     Capillary Refill: Capillary refill takes less than 2 seconds.  Neurological:  General: No focal deficit present.     Mental Status: She is alert and oriented to person, place, and time.  Psychiatric:        Mood and Affect: Mood normal.        Behavior: Behavior normal.        Thought Content: Thought content normal.        Judgment: Judgment normal.        Assessment & Plan    1. 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 Will check CK today. Refer to cardiology.   - CK Total (and CKMB); Future  2. 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 Will check CK today. Refer to cardiology.   - CK Total (and CKMB); Future - EKG 12-Lead  3. Type 2 diabetes mellitus with hyperglycemia, without long-term current use of insulin (HCC) HgbA1c slightly improved at 9.5, down from 10.0 at last check. Patient unable to monitor her blood sugar, as she states  that she does not have a meter. New order for monitor, lancets, and strips sent to her pharmacy. Advised her to check sugars 2 times daily. The  goal is to have fasting blood sugar between 70 and 100. Recheck HgbA1c in three months.  - POCT glycosylated hemoglobin (Hb A1C) - blood glucose meter kit and supplies; Dispense based on patient and insurance  preference. Use up to four times daily as directed. (FOR ICD-10 E10.9, E11.9).  Dispense: 1 each; Refill: 0  4. Chronic left-sided low back pain with left-sided sciatica Trial celebrex 200 mg up to twice daily as needed for pain/inflammation. Refer to orthopedics for further evaluation.  - Ambulatory referral to Orthopedic Surgery - celecoxib (CELEBREX) 200 MG capsule; Take 1 capsule (200 mg total) by mouth 2 (two) times daily as needed.  Dispense: 45 capsule; Refill: 2  5. Inflammatory polyarthritis (HCC) Add celebrex 200 mg twice daily as needed for pain and inflammation.  - celecoxib (CELEBREX) 200 MG capsule; Take 1 capsule (200 mg total) by mouth 2 (two) times daily as needed.  Dispense: 45 capsule; Refill: 2  6. Restless leg syndrome Add requip 0.5 mg, starting with one tablet at bedtime, increasing to two tablets at bedtime as needed.  - CBC; Future - rOPINIRole (REQUIP) 0.5 MG tablet; Take 1-2 tablets (0.5-1 mg total) by mouth at bedtime.  Dispense: 45 tablet; Refill: 2  7. Leg cramps Check CBC and CMP for further evaluation.  - CBC; Future - Comp Met (CMET); Future  8. Abnormal weight gain Check thyroid panel for further  evaluation.  - TSH + free T4; Future    Problem List Items Addressed This Visit       Endocrine   Type 2 diabetes mellitus with hyperglycemia, without long-term current use of insulin (HCC)   Relevant Medications   blood glucose meter kit and supplies   Other Relevant Orders   POCT glycosylated hemoglobin (Hb A1C) (Completed)     Nervous and Auditory   Chronic left-sided low back pain with left-sided sciatica   Relevant Medications   rOPINIRole (REQUIP) 0.5 MG tablet   celecoxib (CELEBREX) 200 MG capsule   Other Relevant Orders   Ambulatory referral to Orthopedic Surgery     Musculoskeletal and Integument   Inflammatory polyarthritis ([place])   Relevant Medications   celecoxib (CELEBREX) 200 MG capsule     Other   Chest pain - Primary    Relevant Orders   CK Total (  and CKMB) (Completed)   Abnormal ECG   Relevant Orders   CK Total (and CKMB) (Completed)   EKG 12-Lead (Completed)   Restless leg syndrome   Relevant Medications   rOPINIRole (REQUIP) 0.5 MG tablet   Other Relevant Orders   CBC (Completed)   Leg cramps   Relevant Orders   CBC (Completed)   Comp Met (CMET) (Completed)   Abnormal weight gain   Relevant Orders   TSH + free T4 (Completed)     Return in about 3 months (around 10/09/2022) for diabetes with HgbA1c check - needs lab appointment in few days. already ordered .         Ronnell Freshwater, NP  [facility] at [facility] [phone] (phone) [phone] (fax)  [place]

## 2022-07-09 ENCOUNTER — Ambulatory Visit (INDEPENDENT_AMBULATORY_CARE_PROVIDER_SITE_OTHER): Payer: 59 | Admitting: Nurse Practitioner

## 2022-07-09 ENCOUNTER — Encounter: Payer: Self-pay | Admitting: Nurse Practitioner

## 2022-07-09 VITALS — BP 130/76 | HR 82 | Ht 70.08 in | Wt 204.4 lb

## 2022-07-09 DIAGNOSIS — R252 Cramp and spasm: Secondary | ICD-10-CM

## 2022-07-09 DIAGNOSIS — R9431 Abnormal electrocardiogram [ECG] [EKG]: Secondary | ICD-10-CM | POA: Diagnosis not present

## 2022-07-09 DIAGNOSIS — G2581 Restless legs syndrome: Secondary | ICD-10-CM

## 2022-07-09 DIAGNOSIS — R079 Chest pain, unspecified: Secondary | ICD-10-CM | POA: Diagnosis not present

## 2022-07-09 DIAGNOSIS — E1165 Type 2 diabetes mellitus with hyperglycemia: Secondary | ICD-10-CM

## 2022-07-09 DIAGNOSIS — M064 Inflammatory polyarthropathy: Secondary | ICD-10-CM

## 2022-07-09 DIAGNOSIS — M5442 Lumbago with sciatica, left side: Secondary | ICD-10-CM | POA: Diagnosis not present

## 2022-07-09 DIAGNOSIS — G8929 Other chronic pain: Secondary | ICD-10-CM

## 2022-07-09 DIAGNOSIS — R635 Abnormal weight gain: Secondary | ICD-10-CM

## 2022-07-09 LAB — POCT GLYCOSYLATED HEMOGLOBIN (HGB A1C): Hemoglobin A1C: 9.5 % — AB (ref 4.0–5.6)

## 2022-07-09 MED ORDER — ROPINIROLE HCL 0.5 MG PO TABS: 0.5000 mg | ORAL_TABLET | Freq: Every day | ORAL | 2 refills | Status: DC

## 2022-07-09 MED ORDER — CELECOXIB 200 MG PO CAPS: 200.0000 mg | ORAL_CAPSULE | Freq: Two times a day (BID) | ORAL | 2 refills | Status: DC | PRN

## 2022-07-09 MED ORDER — BLOOD GLUCOSE METER KIT: PACK | 0 refills | Status: DC

## 2022-07-09 NOTE — Progress Notes (Signed)
Increased januvia to 100 mg daily

## 2022-07-10 NOTE — Progress Notes (Signed)
Reviewed with patient. Referred to cardiology due to abnormal ECG and change in ECG since last one 07/2021.

## 2022-07-13 ENCOUNTER — Ambulatory Visit: Payer: 59 | Admitting: Surgery

## 2022-07-13 ENCOUNTER — Encounter: Payer: Self-pay | Admitting: Surgery

## 2022-07-13 ENCOUNTER — Ambulatory Visit: Payer: Self-pay

## 2022-07-13 VITALS — BP 157/82 | HR 88 | Ht 70.08 in | Wt 198.0 lb

## 2022-07-13 DIAGNOSIS — M542 Cervicalgia: Secondary | ICD-10-CM

## 2022-07-13 DIAGNOSIS — M25552 Pain in left hip: Secondary | ICD-10-CM

## 2022-07-13 DIAGNOSIS — M545 Low back pain, unspecified: Secondary | ICD-10-CM | POA: Diagnosis not present

## 2022-07-13 DIAGNOSIS — M1612 Unilateral primary osteoarthritis, left hip: Secondary | ICD-10-CM

## 2022-07-13 DIAGNOSIS — M4726 Other spondylosis with radiculopathy, lumbar region: Secondary | ICD-10-CM

## 2022-07-13 DIAGNOSIS — G8929 Other chronic pain: Secondary | ICD-10-CM

## 2022-07-14 ENCOUNTER — Other Ambulatory Visit: Payer: 59

## 2022-07-14 DIAGNOSIS — R635 Abnormal weight gain: Secondary | ICD-10-CM

## 2022-07-14 DIAGNOSIS — G2581 Restless legs syndrome: Secondary | ICD-10-CM

## 2022-07-14 DIAGNOSIS — R9431 Abnormal electrocardiogram [ECG] [EKG]: Secondary | ICD-10-CM

## 2022-07-14 DIAGNOSIS — R252 Cramp and spasm: Secondary | ICD-10-CM

## 2022-07-14 DIAGNOSIS — R079 Chest pain, unspecified: Secondary | ICD-10-CM

## 2022-07-15 LAB — TSH+FREE T4
Free T4: 1.34 ng/dL (ref 0.82–1.77)
TSH: 2.02 u[IU]/mL (ref 0.450–4.500)

## 2022-07-15 LAB — COMPREHENSIVE METABOLIC PANEL
ALT: 7 IU/L (ref 0–32)
AST: 13 IU/L (ref 0–40)
Albumin/Globulin Ratio: 1.1 — ABNORMAL LOW (ref 1.2–2.2)
Albumin: 4.1 g/dL (ref 3.9–4.9)
Alkaline Phosphatase: 152 IU/L — ABNORMAL HIGH (ref 44–121)
BUN/Creatinine Ratio: 17 (ref 12–28)
BUN: 18 mg/dL (ref 8–27)
Bilirubin Total: 0.4 mg/dL (ref 0.0–1.2)
CO2: 23 mmol/L (ref 20–29)
Calcium: 9.2 mg/dL (ref 8.7–10.3)
Chloride: 100 mmol/L (ref 96–106)
Creatinine, Ser: 1.06 mg/dL — ABNORMAL HIGH (ref 0.57–1.00)
Globulin, Total: 3.9 g/dL (ref 1.5–4.5)
Glucose: 237 mg/dL — ABNORMAL HIGH (ref 70–99)
Potassium: 4.6 mmol/L (ref 3.5–5.2)
Sodium: 140 mmol/L (ref 134–144)
Total Protein: 8 g/dL (ref 6.0–8.5)
eGFR: 59 mL/min/{1.73_m2} — ABNORMAL LOW (ref >59)

## 2022-07-15 LAB — CBC
Hematocrit: 39 % (ref 34.0–46.6)
Hemoglobin: 12.9 g/dL (ref 11.1–15.9)
MCH: 27 pg (ref 26.6–33.0)
MCHC: 33.1 g/dL (ref 31.5–35.7)
MCV: 82 fL (ref 79–97)
Platelets: 248 10*3/uL (ref 150–450)
RBC: 4.77 x10E6/uL (ref 3.77–5.28)
RDW: 14.6 % (ref 11.7–15.4)
WBC: 7.2 10*3/uL (ref 3.4–10.8)

## 2022-07-15 LAB — CK TOTAL AND CKMB (NOT AT ARMC)
CK-MB Index: 1.3 ng/mL (ref 0.0–5.3)
Total CK: 48 U/L (ref 32–182)

## 2022-07-16 DIAGNOSIS — G2581 Restless legs syndrome: Secondary | ICD-10-CM | POA: Insufficient documentation

## 2022-07-16 DIAGNOSIS — G8929 Other chronic pain: Secondary | ICD-10-CM | POA: Insufficient documentation

## 2022-07-16 DIAGNOSIS — R079 Chest pain, unspecified: Secondary | ICD-10-CM | POA: Insufficient documentation

## 2022-07-16 DIAGNOSIS — R9431 Abnormal electrocardiogram [ECG] [EKG]: Secondary | ICD-10-CM | POA: Insufficient documentation

## 2022-07-16 DIAGNOSIS — R635 Abnormal weight gain: Secondary | ICD-10-CM | POA: Insufficient documentation

## 2022-07-16 DIAGNOSIS — R252 Cramp and spasm: Secondary | ICD-10-CM | POA: Insufficient documentation

## 2022-07-21 ENCOUNTER — Encounter: Payer: Self-pay | Admitting: Internal Medicine

## 2022-07-21 ENCOUNTER — Ambulatory Visit: Payer: 59 | Admitting: Internal Medicine

## 2022-07-21 VITALS — BP 126/78 | HR 84 | Ht 71.0 in | Wt 202.8 lb

## 2022-07-21 DIAGNOSIS — K7469 Other cirrhosis of liver: Secondary | ICD-10-CM | POA: Diagnosis not present

## 2022-07-21 DIAGNOSIS — B1920 Unspecified viral hepatitis C without hepatic coma: Secondary | ICD-10-CM

## 2022-07-21 NOTE — Patient Instructions (Addendum)
You have been scheduled for an abdominal ultrasound at [facility] (1st floor of hospital) on Thursday 07/23/22 at 10:30 am. Please arrive 15 minutes prior to your appointment for registration. Make certain not to have anything to eat or drink 6 hours prior to your appointment. Should you need to reschedule your appointment, please contact radiology at [phone]. This test typically takes about 30 minutes to perform.  Start fiber (Benefiber or Citrucel) 2 teaspoons in 8 ounces of liquid daily.  _______________________________________________________  If you are age 75 or older, your body mass index should be between 23-30. Your Body mass index is 28.28 kg/m. If this is out of the aforementioned range listed, please consider follow up with your Primary Care Provider.  If you are age 65 or younger, your body mass index should be between 19-25. Your Body mass index is 28.28 kg/m. If this is out of the aformentioned range listed, please consider follow up with your Primary Care Provider.   ________________________________________________________  The [place] [facility] providers would like to encourage you to use [facility] to communicate with providers for non-urgent requests or questions.  Due to long hold times on the telephone, sending your provider a message by [facility] may be a faster and more efficient way to get a response.  Please allow 48 business hours for a response.  Please remember that this is for non-urgent requests.  _______________________________________________________

## 2022-07-21 NOTE — Progress Notes (Signed)
Chief Complaint: HCV cirrhosis  HPI : 61 year old female with history of chronic hep C s/p treatment, COPD, DM, TB s/p treatment presents for follow up of HCV cirrhosis  Interval History: Denies hematochezia or melena. Has occasional fecal incontinence. When she remembers to take her Benefiber, this seems to help with the incontinence. She also notices that increased sugar loads seem to cause more issues with incontinence. Denies abdominal swelling or confusion. Endorses 1-2 Bms per day. Denies alcohol. Dysphagia has  improved. Had a negative Cologuard in 12/2019.  Current Outpatient Medications  Medication Sig Dispense Refill   aspirin-acetaminophen-caffeine (EXCEDRIN MIGRAINE) 250-250-65 MG tablet Take 1 tablet by mouth every 6 (six) hours as needed for headache.     atorvastatin (LIPITOR) 20 MG tablet Take 1 tablet (20 mg total) by mouth daily. 90 tablet 1   Bismuth Subsalicylate (KAOPECTATE) 262 MG TABS Take 262 mg by mouth daily as needed (upset stomach).     blood glucose meter kit and supplies Dispense based on patient and insurance preference. Use up to four times daily as directed. (FOR ICD-10 E10.9, E11.9). 1 each 0   calcium carbonate (TUMS - DOSED IN MG ELEMENTAL CALCIUM) 500 MG chewable tablet Chew 1-2 tablets by mouth daily as needed for indigestion or heartburn.     celecoxib (CELEBREX) 200 MG capsule Take 1 capsule (200 mg total) by mouth 2 (two) times daily as needed. 45 capsule 2   cetirizine (ZYRTEC) 10 MG tablet Take 10 mg by mouth daily as needed for allergies.     Cholecalciferol (VITAMIN D) 50 MCG (2000 UT) tablet Take 2,000 Units by mouth daily.     diphenhydrAMINE (BENADRYL) 2 % cream Apply 1 application topically 3 (three) times daily as needed for itching.     fluconazole (DIFLUCAN) 150 MG tablet Take 1 tablet po once. May repeat dose in 3 days as needed for persistent symptoms. 3 tablet 0   glimepiride (AMARYL) 4 MG tablet Take 1 tablet (4 mg total) by mouth 2 (two)  times daily. 180 tablet 1   glucose blood (TRUETRACK TEST) test strip Bloodsugar testing QD and prn 100 each 12   guaifenesin (HUMIBID E) 400 MG TABS tablet Take 400 mg by mouth daily as needed.     metFORMIN (GLUCOPHAGE) 1000 MG tablet Take 1 tablet (1,000 mg total) by mouth 2 (two) times daily with a meal. 180 tablet 3   mirabegron ER (MYRBETRIQ) 50 MG TB24 tablet Take 1 tablet (50 mg total) by mouth daily. 30 tablet 11   mupirocin ointment (BACTROBAN) 2 % Apply small amount to effected area twice daily for 2 weeks then as needed (Patient taking differently: Apply 1 application  topically 2 (two) times daily as needed (sore). Apply small amount to effected area twice daily for 2 weeks then as needed) 22 g 1   nitrofurantoin, macrocrystal-monohydrate, (MACROBID) 100 MG capsule Take 1 capsule (100 mg total) by mouth 2 (two) times daily. 14 capsule 0   pantoprazole (PROTONIX) 40 MG tablet Take 1 tablet (40 mg total) by mouth daily. 90 tablet 1   rOPINIRole (REQUIP) 0.5 MG tablet Take 1-2 tablets (0.5-1 mg total) by mouth at bedtime. 45 tablet 2   sitaGLIPtin (JANUVIA) 50 MG tablet Take 1 tablet (50 mg total) by mouth 2 (two) times daily. 60 tablet 3   valsartan (DIOVAN) 160 MG tablet Take 1 tablet (160 mg total) by mouth daily. 90 tablet 0   No current facility-administered medications for this visit.  Review of Systems: All systems reviewed and negative except where noted in HPI.   Physical Exam: BP 126/78   Pulse 84   Ht 5' 11"  (1.803 m)   Wt 202 lb 12.8 oz (92 kg)   BMI 28.28 kg/m  Constitutional: Pleasant,well-developed, female in no acute distress. HEENT: Normocephalic and atraumatic. Conjunctivae are normal. No scleral icterus. Cardiovascular: Normal rate, regular rhythm.  Pulmonary/chest: Effort normal and breath sounds normal. No wheezing, rales or rhonchi. Abdominal: Soft, nondistended, nontender. Bowel sounds active throughout. There are no masses palpable. No  hepatomegaly. Extremities: No edema Neurological: Alert and oriented to person place and time. Skin: Skin is warm and dry. No rashes noted. Psychiatric: Normal mood and affect. Behavior is normal.  Labs 03/2021: Hep B surface antigen NR, Hep A antibody positive  Labs 11/2021: CBC nml, CMP with elevated Cr of 1.23. LFTs with elevated alk phos of 133  Labs 06/2022: CBC nml. CMP with mildly elevated Cr of 1.06. LFTs with elevated alk phos of 152.   U/S with Elastography 11/17/21: IMPRESSION: ULTRASOUND ABDOMEN: Hepatic echotexture nodular hepatic contour suggestive of cirrhosis. No overt hepatic lesion visualized. ULTRASOUND HEPATIC ELASTOGRAPHY: Median kPa:  20 Diagnostic category: > or =17 kPa: highly suggestive of cACLD with an increased probability of clinically significant portal Hypertension  EGD 03/11/22: - Benign-appearing esophageal stenosis. Dilated. Biopsied. - Erythematous mucosa in the antrum. Biopsied. - Normal examined duodenum. - Biopsies were taken with a cold forceps for histology in the proximal esophagus and in the distal esophagus. Path: 1. Surgical [P], gastric antrum and gastric body - MILD REACTIVE GASTROPATHY, INVOLVING ANTRAL-TYPE MUCOSA. - GASTRIC OXYNTIC MUCOSA WITH MILD GLANDULAR DILATATION, SUGGESTIVE OF PROTON PUMP INHIBITOR EFFECT. - NEGATIVE FOR HELICOBACTER PYLORI ORGANISMS (H. PYLORI IMMUNOHISTOCHEMICAL (IHC) STAIN AND ADEQUATE CONTROLS EXAMINED). - NEGATIVE FOR INTESTINAL METAPLASIA AND MALIGNANCY. 2. Surgical [P], mid esophagus and distal esophagus - SQUAMOCOLUMNAR MUCOSA WITH REACTIVE CHANGES, CONSISTENT WITH REFLUX EFFECT (SQUAMOUS BASAL HYPERPLASIA AND ELONGATION OF PAPILLAE, FOCAL PARAKERATOSIS AND CHRONIC INFLAMMATION). - PAS STAIN NEGATIVE FOR FUNGI, WITH ADEQUATE CONTROL. - HELICOBACTER PYLORI IHC STAIN NEGATIVE FOR ORGANISMS, WITH ADEQUATE CONTROLS. - NO VIRAL CYTOPATHIC CHANGE, DYSPLASIA OR MALIGNANCY IDENTIFIED.  ASSESSMENT AND  PLAN: HCV cirrhosis Fecal incontinence Patient presents for follow up of HCV cirrhosis. No evidence of decompensation at this time. Will continue [place] screening. Patient does describe some occasional fecal incontinence due to fluctuations of blood sugars, which seems to be helped with fiber supplementation.  - Continue daily fiber supplement - Previously referred to a nutritionist to encourage protein consumption 1-1.5 g/kg/day along with a carb controlled diet - RUQ U/S for [place] screening - Continue coffee consumption - Patient will continue to get Cologuard tests with her PCP - RTC 6 months  MELD 3.0: 10 at 08/18/2021  9:01 AM MELD-Na: 9 at 08/18/2021  9:01 AM Calculated from: Serum Creatinine: 1.25 mg/dL at 08/18/2021  9:01 AM Serum Sodium: 138 mmol/L (Using max of 137 mmol/L) at 08/18/2021  9:01 AM Total Bilirubin: 0.6 mg/dL (Using min of 1 mg/dL) at 08/18/2021  9:01 AM Serum Albumin: 4.0 g/dL (Using max of 3.5 g/dL) at 08/18/2021  9:01 AM INR(ratio): 1.0 at 08/18/2021  9:01 AM Age at listing (hypothetical): 3 years Sex: Female at 08/18/2021  9:01 AM  Christia Reading, MD

## 2022-07-21 NOTE — Therapy (Deleted)
OUTPATIENT PHYSICAL THERAPY THORACOLUMBAR EVALUATION   Patient Name: Danielle Hurley MRN: [id number] DOB:[birth year], 61 y.o., female Today's Date: 07/21/2022    Past Medical History:  Diagnosis Date   Arthritis    COPD (chronic obstructive pulmonary disease) (HCC)    Diabetes mellitus without complication (HCC)    Dyspnea    GERD (gastroesophageal reflux disease)    Hepatitis C    treated 2022   High cholesterol    History of kidney stones    Hypertension    Tuberculosis    non contagious Dx 05/2020   Past Surgical History:  Procedure Laterality Date   CHOLECYSTECTOMY  1998   TUBAL LIGATION  01/1995   VIDEO BRONCHOSCOPY WITH ENDOBRONCHIAL NAVIGATION N/A 09/04/2021   Procedure: VIDEO BRONCHOSCOPY WITH ENDOBRONCHIAL NAVIGATION;  Surgeon: Loreli Slot, MD;  [facility];  Service: Thoracic;  Laterality: N/A;   Patient Active Problem List   Diagnosis Date Noted   Chest pain 07/16/2022   Abnormal ECG 07/16/2022   Chronic left-sided low back pain with left-sided sciatica 07/16/2022   Restless leg syndrome 07/16/2022   Leg cramps 07/16/2022   Abnormal weight gain 07/16/2022   Type 2 diabetes mellitus with hyperglycemia, without long-term current use of insulin (HCC) 05/31/2022   Urinary tract infection with hematuria 05/31/2022   Gastroesophageal reflux disease without esophagitis 05/31/2022   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   Cancer screening 05/19/2022   Overactive bladder 11/02/2021   Hepatic cirrhosis (HCC) 10/21/2021   Immunization counseling 10/21/2021   Pulmonary nodule 08/19/2021   Body mass index 27.0-27.9, adult 08/19/2021   Need for hepatitis B vaccination 07/22/2021   Cellulitis of ear, right 07/08/2021   TB (pulmonary tuberculosis) 06/13/2021   Medication monitoring encounter 06/13/2021   Upper airway cough syndrome 05/08/2021   TB of lung w/ cavitation 05/08/2021   Hypertension associated with diabetes (HCC) 03/31/2021   Diabetes  mellitus without complication (HCC) 03/31/2021   Hyperlipidemia associated with type 2 diabetes mellitus (HCC) 03/31/2021   Encounter to establish care 03/31/2021   History of active tuberculosis 03/31/2021   Wheezing 03/31/2021   Chronic hepatitis C without hepatic coma (HCC) 03/31/2021   Chronic left shoulder pain 03/31/2021   Dysuria 03/31/2021   Vaginal yeast infection 03/31/2021    PCP: Carlean Jews, NP  REFERRING PROVIDER: Naida Sleight, PA-C  REFERRING DIAG: Neck pain [M54.2], Chronic left-sided low back pain, unspecified whether sciatica present [M54.50, G89.29], Pain in left hip [M25.552], Unilateral primary osteoarthritis, left hip [M16.12], Other spondylosis with radiculopathy, lumbar region [M47.26]  Rationale for Evaluation and Treatment Rehabilitation  THERAPY DIAG:  No diagnosis found.  ONSET DATE: ***  SUBJECTIVE:  SUBJECTIVE STATEMENT: *** PERTINENT HISTORY:  Arthritis, COPD, DM, Dyspnea, HTN   PAIN:  Are you having pain? Yes: NPRS scale: ***/10 Pain location: *** Pain description: *** Aggravating factors: *** Relieving factors: ***   PRECAUTIONS: {Therapy precautions:24002}  WEIGHT BEARING RESTRICTIONS {Yes ***/No:24003}  FALLS:  Has patient fallen in last 6 months? {fallsyesno:27318}  LIVING ENVIRONMENT: Lives with: {OPRC lives with:25569::"lives with their family"} Lives in: {Lives in:25570} Stairs: {opstairs:27293} Has following equipment at home: {Assistive devices:23999}  OCCUPATION: ***  PLOF: {PLOF:24004}  PATIENT GOALS ***   OBJECTIVE:   DIAGNOSTIC FINDINGS:  ***  PATIENT SURVEYS:  {rehab surveys:24030}  SCREENING FOR RED FLAGS: Bowel or bladder incontinence: {Yes/No:304960894} Spinal tumors: {Yes/No:304960894} Cauda equina syndrome:  {Yes/No:304960894} Compression fracture: {Yes/No:304960894} Abdominal aneurysm: {Yes/No:304960894}  COGNITION:  Overall cognitive status: {cognition:24006}     SENSATION: {sensation:27233}  MUSCLE LENGTH: Hamstrings: Right *** deg; Left *** deg Thomas test: Right *** deg; Left *** deg  POSTURE: {posture:25561}  PALPATION: ***  LUMBAR ROM:   {AROM/PROM:27142}  A/PROM  eval  Flexion   Extension   Right lateral flexion   Left lateral flexion   Right rotation   Left rotation    (Blank rows = not tested)  LOWER EXTREMITY ROM:     {AROM/PROM:27142}  Right eval Left eval  Hip flexion    Hip extension    Hip abduction    Hip adduction    Hip internal rotation    Hip external rotation    Knee flexion    Knee extension    Ankle dorsiflexion    Ankle plantarflexion    Ankle inversion    Ankle eversion     (Blank rows = not tested)  LOWER EXTREMITY MMT:    MMT Right eval Left eval  Hip flexion    Hip extension    Hip abduction    Hip adduction    Hip internal rotation    Hip external rotation    Knee flexion    Knee extension    Ankle dorsiflexion    Ankle plantarflexion    Ankle inversion    Ankle eversion     (Blank rows = not tested)  LUMBAR SPECIAL TESTS:  {lumbar special test:25242}  FUNCTIONAL TESTS:  {Functional tests:24029}  GAIT: Distance walked: *** Assistive device utilized: {Assistive devices:23999} Level of assistance: {Levels of assistance:24026} Comments: ***    TODAY'S TREATMENT  Creating, reviewing, and completing below HEP   PATIENT EDUCATION:  Education details: Educated pt on anatomy and physiology of current symptoms, FOTO, diagnosis, prognosis, HEP,  and POC. Person educated: Patient Education method: Medical illustrator Education comprehension: verbalized understanding and returned demonstration   HOME EXERCISE PROGRAM: ***   ASSESSMENT:  CLINICAL IMPRESSION: Patient referred to PT for ***.  Patient will benefit from skilled PT to address below impairments, limitations and improve overall function.  OBJECTIVE IMPAIRMENTS: decreased activity tolerance, difficulty walking, decreased balance, decreased endurance, decreased mobility, decreased ROM, decreased strength, impaired flexibility, impaired UE/LE use, postural dysfunction, and pain.  ACTIVITY LIMITATIONS: bending, lifting, carry, locomotion, cleaning, community activity, driving, and or occupation  PERSONAL FACTORS: *** are also affecting patient's functional outcome.  REHAB POTENTIAL: {rehabpotential:25112}  CLINICAL DECISION MAKING: {clinical decision making:25114}  EVALUATION COMPLEXITY: {Evaluation complexity:25115}    GOALS: Short term PT Goals Target date: {follow up:25551} Pt will be I and compliant with HEP. Baseline:  Goal status: New Pt will decrease pain by 25% overall Baseline: Goal status: New  Long term PT goals Target date: {follow up:25551}  Pt will improve ROM to [facility] to improve functional mobility Baseline: Goal status: New Pt will improve  hip/knee strength to at least 5-/5 MMT to improve functional strength Baseline: Goal status: New Pt will improve FOTO to at least % functional to show improved function Baseline: Goal status: New Pt will reduce pain by overall 50% overall with usual activity Baseline: Goal status: New Pt will reduce pain to overall less than 2-3/10 with usual activity and work activity. Baseline: Goal status: New Pt will be able to ambulate community distances at least 1000 ft WNL gait pattern without complaints Baseline: Goal status: New  PLAN: PT FREQUENCY: 1-3 times per week   PT DURATION: 6-8 weeks  PLANNED INTERVENTIONS (unless contraindicated): aquatic PT, Canalith repositioning, cryotherapy, Electrical stimulation, Iontophoresis with 4 mg/ml dexamethasome, Moist heat, traction, Ultrasound, gait training, Therapeutic exercise, balance training, neuromuscular  re-education, patient/family education, prosthetic training, manual techniques, passive ROM, dry needling, taping, vasopnuematic device, vestibular, spinal manipulations, joint manipulations   PLAN FOR NEXT SESSION: Assess HEP/update PRN, continue to progress **** mobility, strengthen **** muscles. Decrease patients pain and help minimize ****    Champ Mungo, PT 07/21/2022, 2:50 PM

## 2022-07-23 ENCOUNTER — Ambulatory Visit: Payer: 59 | Admitting: Physical Therapy

## 2022-07-23 ENCOUNTER — Ambulatory Visit (HOSPITAL_COMMUNITY): Payer: 59

## 2022-07-28 ENCOUNTER — Ambulatory Visit: Payer: 59 | Admitting: Physical Therapy

## 2022-08-11 ENCOUNTER — Ambulatory Visit: Payer: 59 | Admitting: Orthopaedic Surgery

## 2022-08-12 ENCOUNTER — Ambulatory Visit: Payer: 59 | Admitting: Orthopaedic Surgery

## 2022-08-24 ENCOUNTER — Other Ambulatory Visit: Payer: Self-pay | Admitting: Nurse Practitioner

## 2022-08-24 ENCOUNTER — Telehealth: Payer: Self-pay

## 2022-08-24 ENCOUNTER — Encounter: Payer: Self-pay | Admitting: Nurse Practitioner

## 2022-08-24 DIAGNOSIS — N3281 Overactive bladder: Secondary | ICD-10-CM

## 2022-08-24 MED ORDER — OXYBUTYNIN CHLORIDE ER 10 MG PO TB24: 10.0000 mg | ORAL_TABLET | Freq: Every day | ORAL | 2 refills | Status: DC

## 2022-08-24 NOTE — Progress Notes (Signed)
Labs stable and improved

## 2022-08-24 NOTE — Telephone Encounter (Signed)
Called pt she is advised of her Rx that was sent 

## 2022-08-24 NOTE — Progress Notes (Signed)
Office Visit Note   Patient: Danielle Hurley           Date of Birth: May 04, 1961           MRN: [id number] Visit Date: 07/13/2022              Requested by: Ronnell Freshwater, NP [place],  [place] [address] PCP: Ronnell Freshwater, NP   Assessment & Plan: Visit Diagnoses:  1. Neck pain   2. Chronic left-sided low back pain, unspecified whether sciatica present   3. Pain in left hip   4. Unilateral primary osteoarthritis, left hip   5. Other spondylosis with radiculopathy, lumbar region     Plan: Follow-up with Dr. Lorin Mercy in 4 weeks for recheck.  Formal PT scheduled.  Follow-Up Instructions: Return in about 4 weeks (around 08/10/2022) for WITH DR YATES FOR RECHECK NECK PAIN, LUMBAR RADICULOPATHY AND LEFT HIP DJD.  QUESTION HIP SURGERY. .   Orders:  Orders Placed This Encounter  Procedures   XR Cervical Spine 2 or 3 views   XR Lumbar Spine 2-3 Views   XR HIP UNILAT W OR W/O PELVIS 2-3 VIEWS LEFT   Ambulatory referral to Physical Therapy   No orders of the defined types were placed in this encounter.     Procedures: No procedures performed   Clinical Data: No additional findings.   Subjective: Chief Complaint  Patient presents with   Neck - Pain   Lower Back - Pain   Left Leg - Pain    HPI  Review of Systems   Objective: Vital Signs: BP (!) 157/82   Pulse 88   Ht 5' 10.08" (1.78 m)   Wt 198 lb (89.8 kg)   BMI 28.35 kg/m   Physical Exam  Ortho Exam  Specialty Comments:  No specialty comments available.  Imaging: No results found.   PMFS History: Patient Active Problem List   Diagnosis Date Noted   Chest pain 07/16/2022   Abnormal ECG 07/16/2022   Chronic left-sided low back pain with left-sided sciatica 07/16/2022   Restless leg syndrome 07/16/2022   Leg cramps 07/16/2022   Abnormal weight gain 07/16/2022   Type 2 diabetes mellitus with hyperglycemia, without long-term current use of insulin ([place]) 05/31/2022   Urinary  tract infection with hematuria 05/31/2022   Gastroesophageal reflux disease without esophagitis 05/31/2022   Inflammatory polyarthritis ([place]) 05/31/2022   Cancer screening 05/19/2022   Overactive bladder 11/02/2021   Hepatic cirrhosis ([place]) 10/21/2021   Immunization counseling 10/21/2021   Pulmonary nodule 08/19/2021   Body mass index 27.0-27.9, adult 08/19/2021   Need for hepatitis B vaccination 07/22/2021   Cellulitis of ear, right 07/08/2021   TB (pulmonary tuberculosis) 06/13/2021   Medication monitoring encounter 06/13/2021   Upper airway cough syndrome 05/08/2021   TB of lung w/ cavitation 05/08/2021   Hypertension associated with diabetes ([place]) 03/31/2021   Diabetes mellitus without complication ([place]) 27/25/3664   Hyperlipidemia associated with type 2 diabetes mellitus ([place]) 03/31/2021   Encounter to establish care 03/31/2021   History of active tuberculosis 03/31/2021   Wheezing 03/31/2021   Chronic hepatitis C without hepatic coma ([place]) 03/31/2021   Chronic left shoulder pain 03/31/2021   Dysuria 03/31/2021   Vaginal yeast infection 03/31/2021   Past Medical History:  Diagnosis Date   Arthritis    COPD (chronic obstructive pulmonary disease) ([place])    Diabetes mellitus without complication ([place])    Dyspnea    GERD (gastroesophageal reflux  disease)    Hepatitis C    treated 2022   High cholesterol    History of kidney stones    Hypertension    Tuberculosis    non contagious Dx 05/2020    Family History  Adopted: Yes  Problem Relation Age of Onset   Colon cancer Neg Hx    Stomach cancer Neg Hx    Esophageal cancer Neg Hx    Colon polyps Neg Hx     Past Surgical History:  Procedure Laterality Date   CHOLECYSTECTOMY  1998   TUBAL LIGATION  01/1995   VIDEO BRONCHOSCOPY WITH ENDOBRONCHIAL NAVIGATION N/A 09/04/2021   Procedure: VIDEO BRONCHOSCOPY WITH ENDOBRONCHIAL NAVIGATION;  Surgeon: Loreli Slot, MD;  Location: [facility] OR;  Service: Thoracic;  Laterality:  N/A;   Social History   Occupational History   Not on file  Tobacco Use   Smoking status: Former    Packs/day: 1.00    Years: 40.00    Total pack years: 40.00    Types: Cigarettes    Quit date: 2014    Years since quitting: 9.7   Smokeless tobacco: Never  Vaping Use   Vaping Use: Never used  Substance and Sexual Activity   Alcohol use: Not Currently    Comment: occasional   Drug use: Not Currently   Sexual activity: Not Currently

## 2022-08-24 NOTE — Telephone Encounter (Signed)
Patient called about her insurance [employer] not covering her myrbetriq although patient was prescribed oxBUTYrin and would like to know if she should start taking medication, please advise thanks!

## 2022-08-24 NOTE — Telephone Encounter (Signed)
If her insurance covered the oxybutinin, we can change back to that. I think urologist did prescription for the Myrbetriq.

## 2022-08-24 NOTE — Telephone Encounter (Signed)
Spoke with patient, she would like to get back on the oxybutinin.

## 2022-08-24 NOTE — Telephone Encounter (Signed)
Please let the patient know that a New prescription for oxybutynin XL 10 mg sent to [facility]. Thanks  -HB

## 2022-08-24 NOTE — Progress Notes (Signed)
New prescription for oxybutynin XL 10 mg sent to [facility].

## 2022-08-27 ENCOUNTER — Ambulatory Visit: Payer: 59 | Admitting: Nurse Practitioner

## 2022-09-07 ENCOUNTER — Telehealth: Payer: Self-pay

## 2022-09-07 NOTE — Telephone Encounter (Signed)
Will you have her find out from her insurance what medication in that class they will cover? I will then send a new prescription. Thanks

## 2022-09-07 NOTE — Telephone Encounter (Signed)
Called pt she is advised of calling her insurance to see what is covered

## 2022-09-07 NOTE — Telephone Encounter (Signed)
Called pt she is advised of her brother labs  Also pt stated that her insurance will no longer pay for her Januvia. she is requesting something else.

## 2022-09-08 ENCOUNTER — Other Ambulatory Visit: Payer: Self-pay | Admitting: Nurse Practitioner

## 2022-09-08 DIAGNOSIS — E1165 Type 2 diabetes mellitus with hyperglycemia: Secondary | ICD-10-CM

## 2022-09-08 MED ORDER — SAXAGLIPTIN HCL 5 MG PO TABS: 5.0000 mg | ORAL_TABLET | Freq: Every day | ORAL | 3 refills | Status: DC

## 2022-09-08 NOTE — Progress Notes (Signed)
D/c [place] as patient's insurance would not cover. Changed to onglyza 5 mg daily. New prescription sent to her pharmacy.

## 2022-09-08 NOTE — Telephone Encounter (Signed)
Patient called office to inform that she found Substitute onglyza that her insurance cover, please advise, thanks!

## 2022-09-08 NOTE — Telephone Encounter (Signed)
Called pt she is advised of Rx that was sent 

## 2022-09-08 NOTE — Telephone Encounter (Signed)
Please let the patient know that I changed the januvia to onglyza 5 mg which is daily. New prescription was sent to [facility]. Thanks  -HB

## 2022-09-24 IMAGING — DX DG CHEST 2V
2 series · 2 of 2 positions shown · non-contrast
Comparison: 04/25/2021, 09/12/2020

CLINICAL DATA: 60-year-old female with a history tuberculosis

EXAM:
CHEST - 2 VIEW

[chest pa]
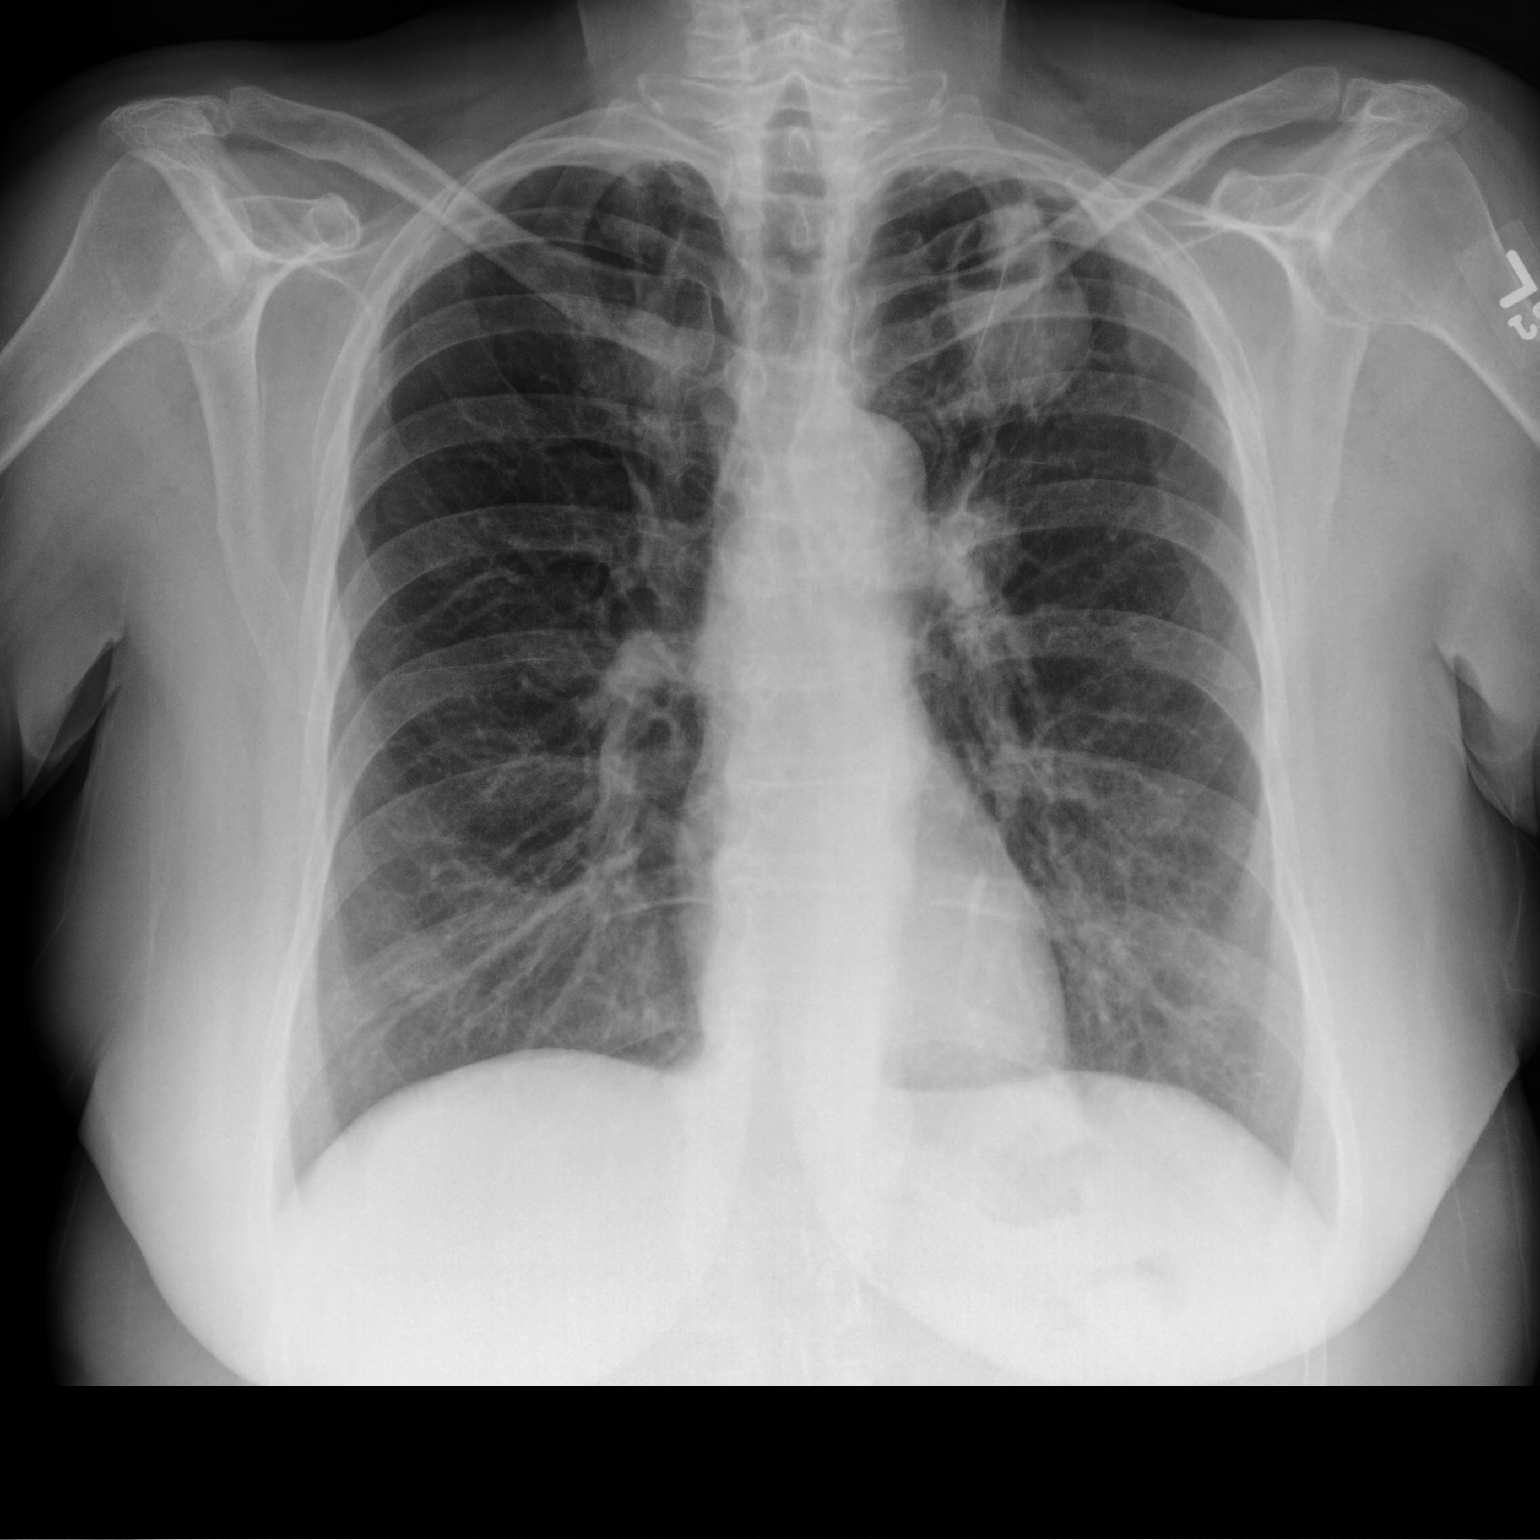

[chest lat]
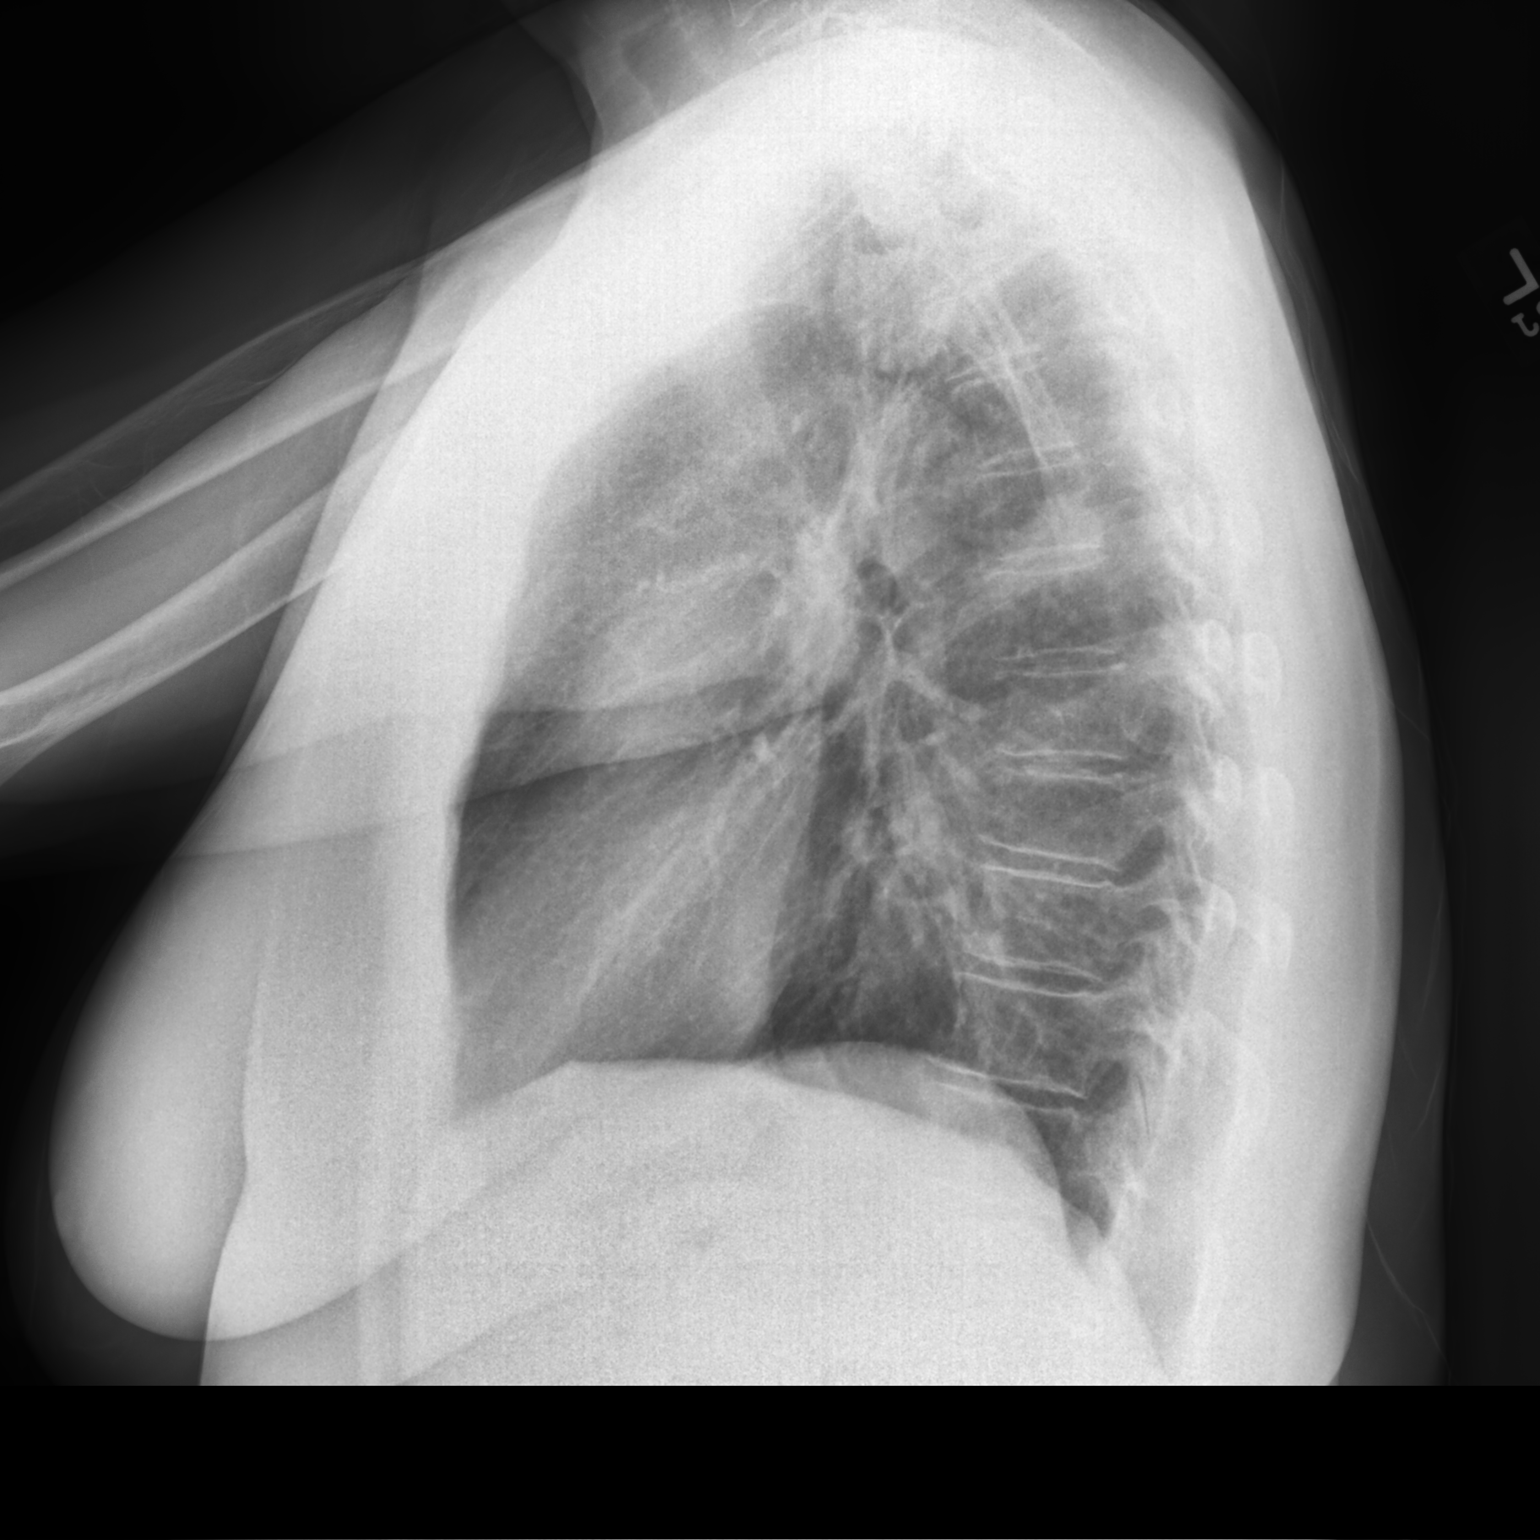

[2 of 2 positions shown; findings below may reference images not displayed]

FINDINGS: Cardiomediastinal silhouette unchanged in size and contour. No
evidence of central vascular congestion or interlobular septal
thickening.

Air-fluid level within cavity of the left upper lobe is unchanged
from prior.

Rounded nodule overlying the left hilum unchanged.

No new confluent airspace disease. Coarsened interstitial markings
persist.

Degenerative changes of the spine.  No acute displaced fracture
IMPRESSION: Similar appearance of the chest x-ray with persisting air-fluid
level within the left upper lobe cavitary lesion, again potentially
superinfection. Secondary nodule within the left lung remains
unchanged.

## 2022-10-08 ENCOUNTER — Ambulatory Visit: Payer: Commercial Managed Care - HMO | Admitting: Nurse Practitioner

## 2022-10-15 IMAGING — DX DG CHEST 2V
2 series · 2 of 2 positions shown · non-contrast
Comparison: Chest x-ray dated June 24, 2021

CLINICAL DATA: History of TB

EXAM:
CHEST - 2 VIEW

[chest pa]
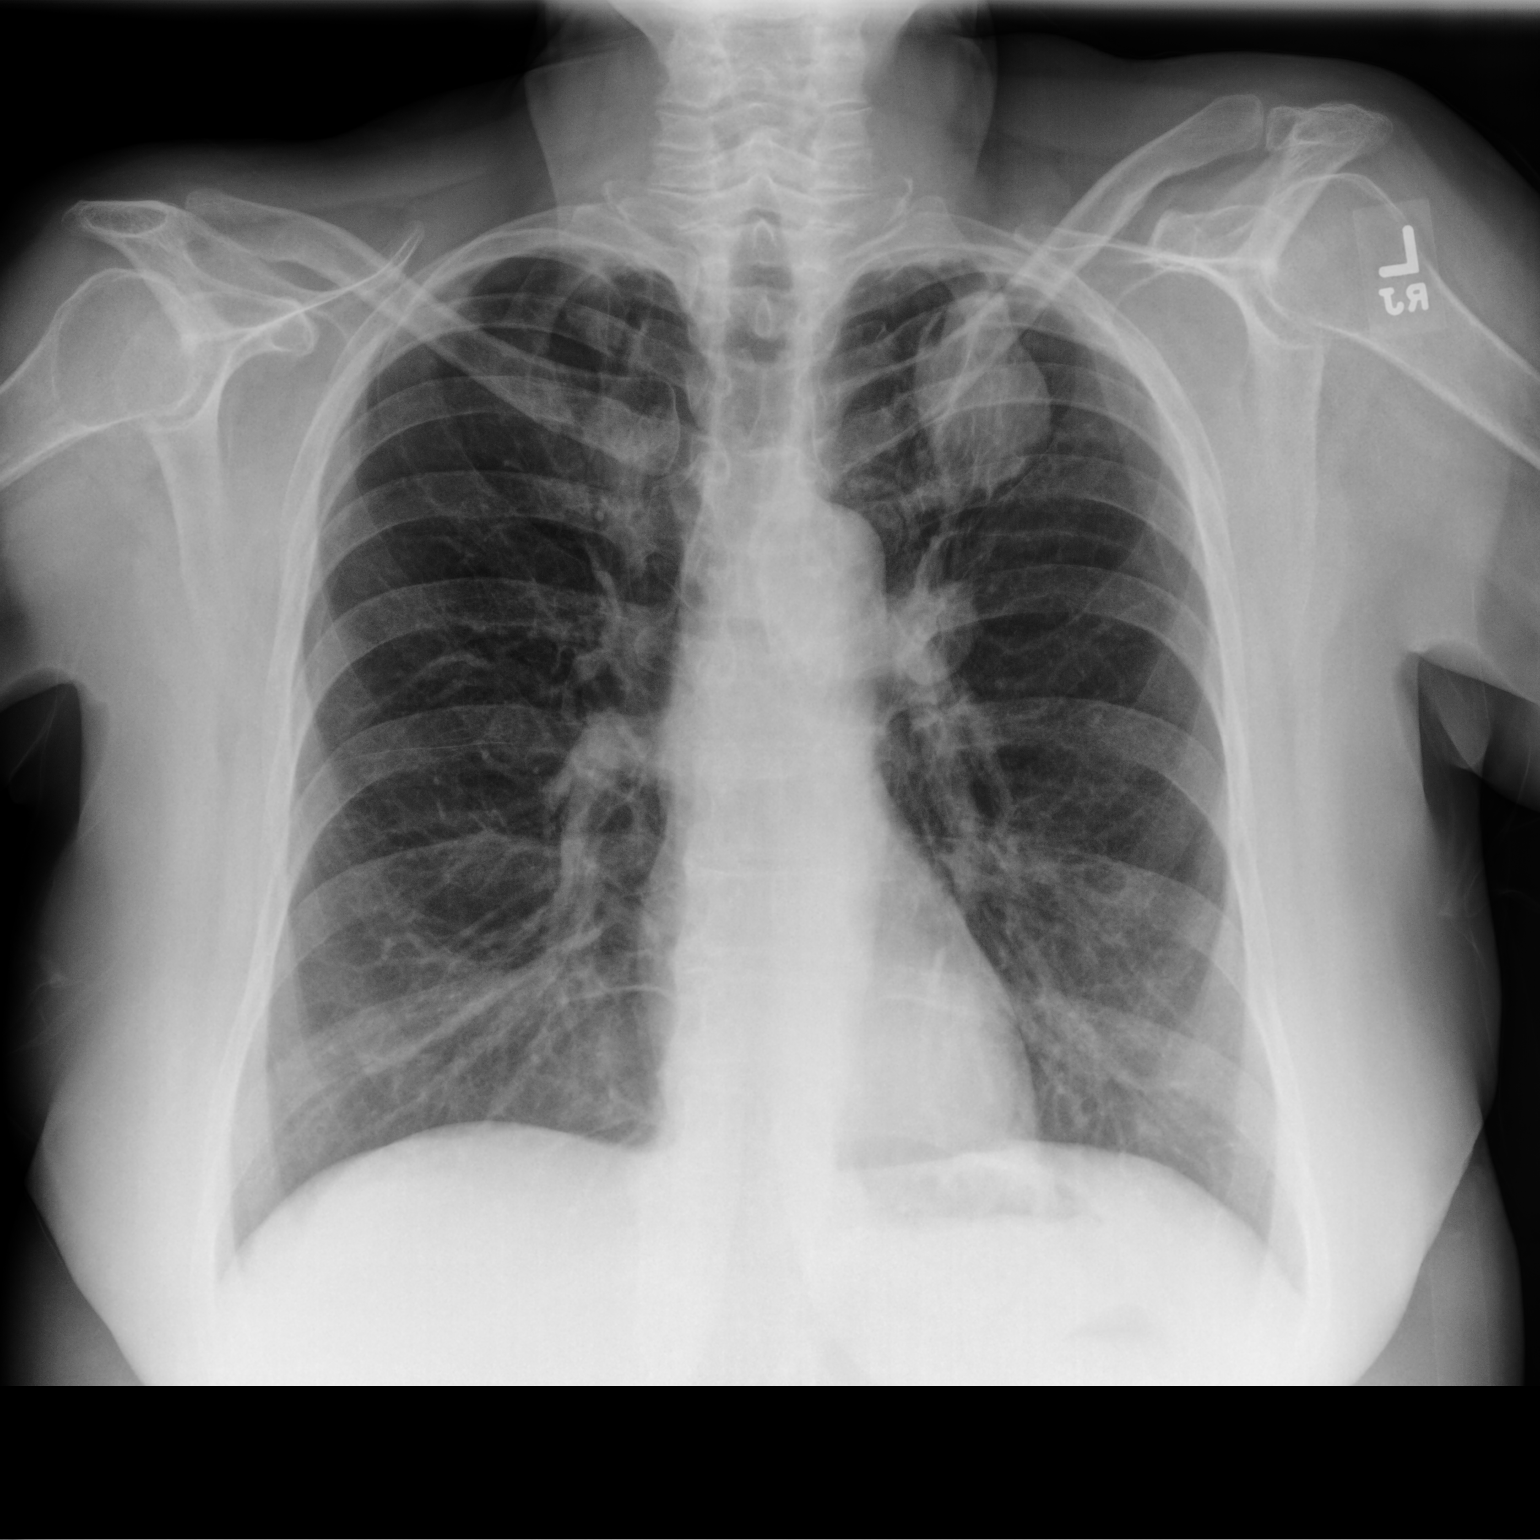

[chest lat]
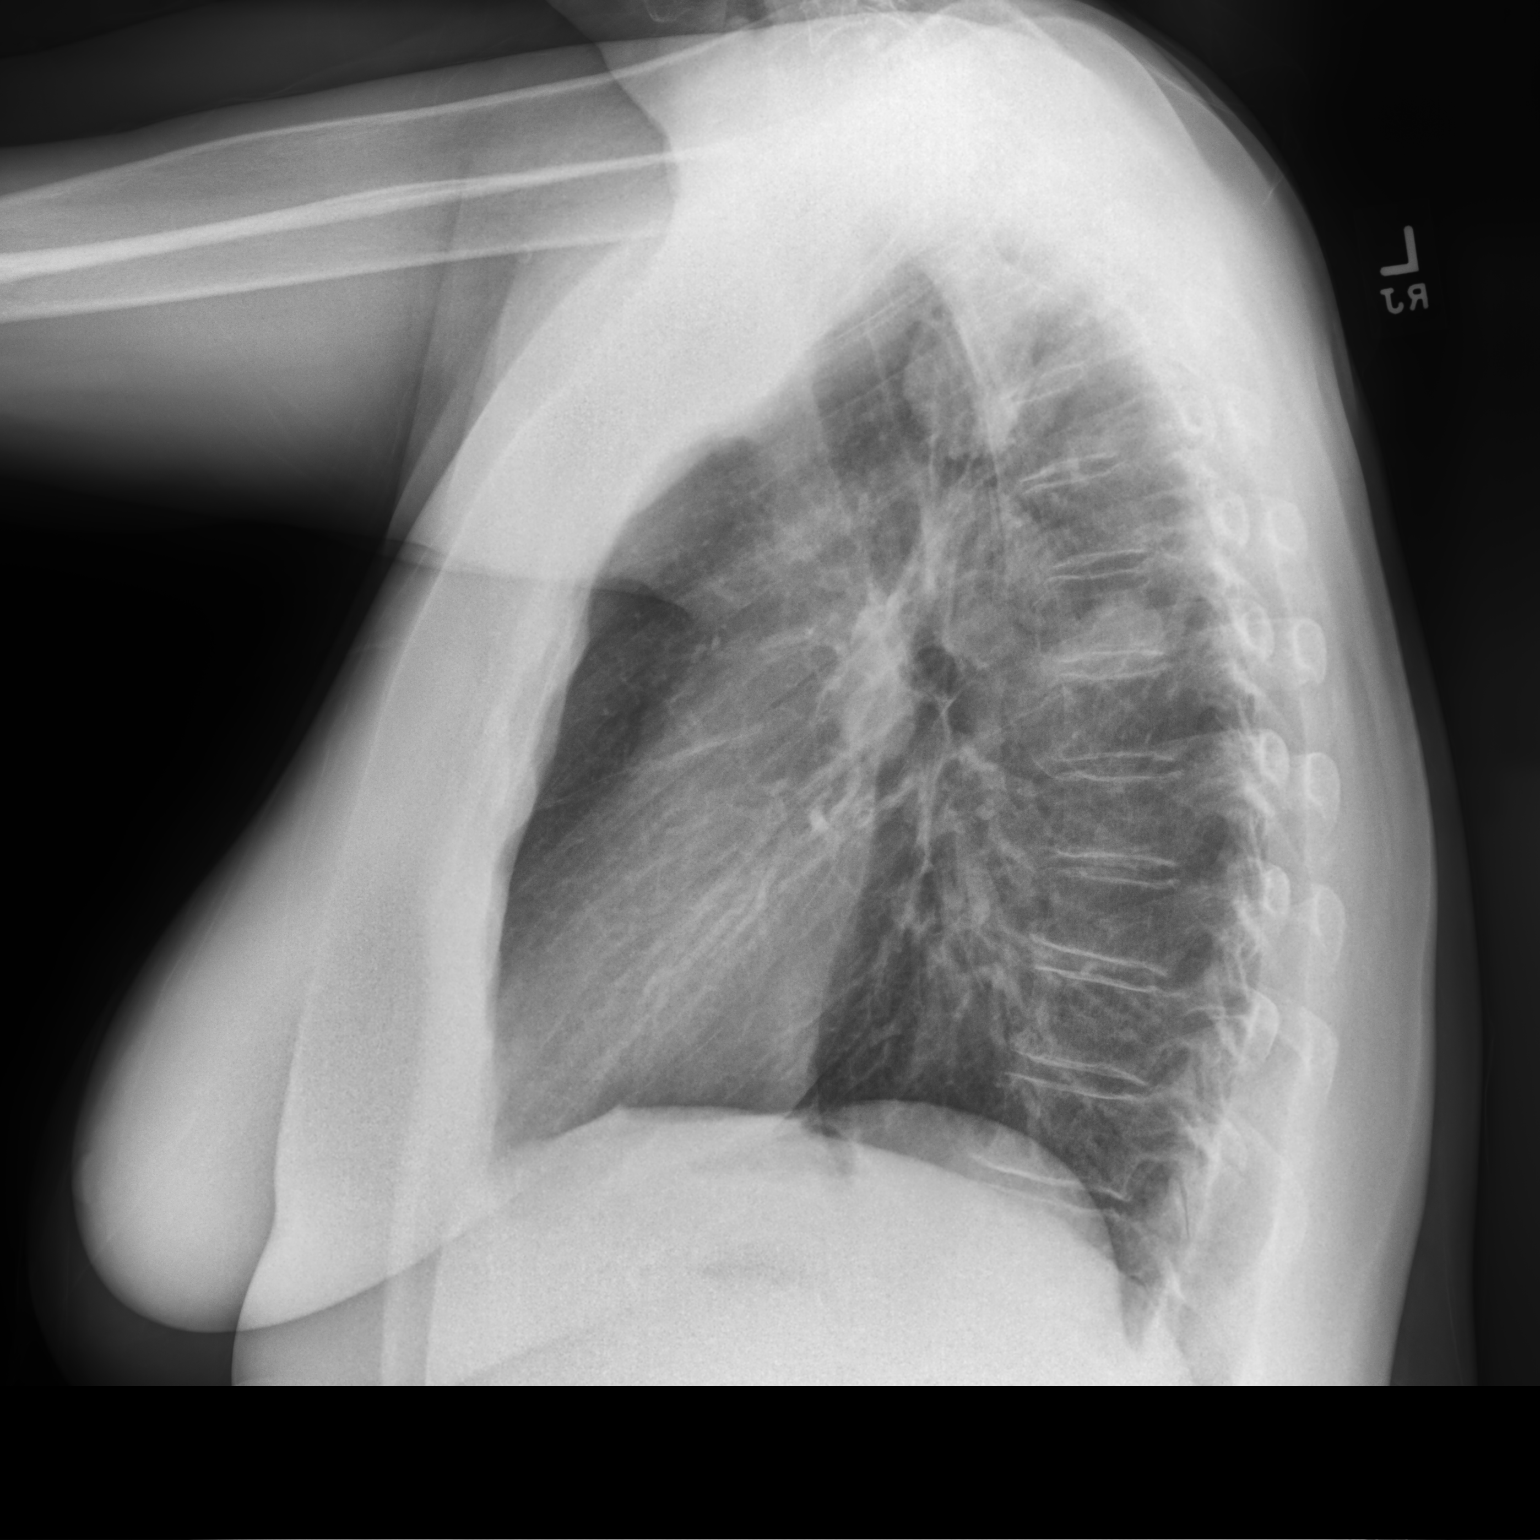

[2 of 2 positions shown; findings below may reference images not displayed]

FINDINGS: Cardiac and mediastinal contours are unchanged and within normal
limits. Redemonstrated cavity of the left upper lobe with increased
air-fluid level. Unchanged large nodular density in the superior
segment of the left lower lobe. No new parenchymal process. No
pleural effusion or pneumothorax.
IMPRESSION: Left upper lobe cavity with increased air-fluid level compared to
most recent prior, concerning for worsening superimposed infection.

## 2022-10-30 ENCOUNTER — Other Ambulatory Visit: Payer: Self-pay | Admitting: Nurse Practitioner

## 2022-10-30 DIAGNOSIS — E1159 Type 2 diabetes mellitus with other circulatory complications: Secondary | ICD-10-CM

## 2022-11-03 ENCOUNTER — Encounter: Payer: Self-pay | Admitting: Nurse Practitioner

## 2022-11-03 ENCOUNTER — Ambulatory Visit (INDEPENDENT_AMBULATORY_CARE_PROVIDER_SITE_OTHER): Payer: Commercial Managed Care - HMO | Admitting: Nurse Practitioner

## 2022-11-03 VITALS — BP 129/80 | HR 80 | Ht 71.0 in | Wt 205.1 lb

## 2022-11-03 DIAGNOSIS — M064 Inflammatory polyarthropathy: Secondary | ICD-10-CM

## 2022-11-03 DIAGNOSIS — M5442 Lumbago with sciatica, left side: Secondary | ICD-10-CM

## 2022-11-03 DIAGNOSIS — Z23 Encounter for immunization: Secondary | ICD-10-CM

## 2022-11-03 DIAGNOSIS — E1159 Type 2 diabetes mellitus with other circulatory complications: Secondary | ICD-10-CM

## 2022-11-03 DIAGNOSIS — E1169 Type 2 diabetes mellitus with other specified complication: Secondary | ICD-10-CM | POA: Diagnosis not present

## 2022-11-03 DIAGNOSIS — Z1211 Encounter for screening for malignant neoplasm of colon: Secondary | ICD-10-CM

## 2022-11-03 DIAGNOSIS — E785 Hyperlipidemia, unspecified: Secondary | ICD-10-CM

## 2022-11-03 DIAGNOSIS — E1165 Type 2 diabetes mellitus with hyperglycemia: Secondary | ICD-10-CM

## 2022-11-03 DIAGNOSIS — R9431 Abnormal electrocardiogram [ECG] [EKG]: Secondary | ICD-10-CM

## 2022-11-03 DIAGNOSIS — I152 Hypertension secondary to endocrine disorders: Secondary | ICD-10-CM

## 2022-11-03 DIAGNOSIS — G8929 Other chronic pain: Secondary | ICD-10-CM

## 2022-11-03 DIAGNOSIS — R079 Chest pain, unspecified: Secondary | ICD-10-CM

## 2022-11-03 DIAGNOSIS — Z1231 Encounter for screening mammogram for malignant neoplasm of breast: Secondary | ICD-10-CM

## 2022-11-03 LAB — POCT UA - MICROALBUMIN
Creatinine, POC: 100 mg/dL
Microalbumin Ur, POC: 150 mg/L

## 2022-11-03 LAB — POCT GLYCOSYLATED HEMOGLOBIN (HGB A1C): HbA1c POC (<> result, manual entry): 8.5 % (ref 4.0–5.6)

## 2022-11-03 MED ORDER — ERTUGLIFLOZIN-METFORMIN HCL 7.5-1000 MG PO TABS: 1.0000 | ORAL_TABLET | Freq: Two times a day (BID) | ORAL | 3 refills | Status: DC

## 2022-11-03 MED ORDER — CELECOXIB 200 MG PO CAPS: 200.0000 mg | ORAL_CAPSULE | Freq: Two times a day (BID) | ORAL | 2 refills | Status: DC | PRN

## 2022-11-03 MED ORDER — VALSARTAN 160 MG PO TABS: 160.0000 mg | ORAL_TABLET | Freq: Every day | ORAL | 1 refills | Status: DC

## 2022-11-03 NOTE — Progress Notes (Signed)
azEstablished patient visit   Patient: Danielle Hurley   DOB: 29-Sep-1961   61 y.o. Female  MRN: [id number] Visit Date: 11/03/2022   Chief Complaint  Patient presents with   Follow-up   Subjective    HPI  Follow up -type 2 diabetes  -HgbA1c slowly Improving, but not optimized. Today, it is 8.5, down from 9.5 at last check  -having right upper quadrant tenderness.  --does have GI provider --did have endoscopy earlier this year. Had to stretch of the esophagus.  --will make appointment with GI for further evaluation and schedule colonoscopy.    Medications: Outpatient Medications Prior to Visit  Medication Sig   aspirin-acetaminophen-caffeine (EXCEDRIN MIGRAINE) 250-250-65 MG tablet Take 1 tablet by mouth every 6 (six) hours as needed for headache.   atorvastatin (LIPITOR) 20 MG tablet Take 1 tablet (20 mg total) by mouth daily.   Bismuth Subsalicylate (KAOPECTATE) 262 MG TABS Take 262 mg by mouth daily as needed (upset stomach).   blood glucose meter kit and supplies Dispense based on patient and insurance preference. Use up to four times daily as directed. (FOR ICD-10 E10.9, E11.9).   calcium carbonate (TUMS - DOSED IN MG ELEMENTAL CALCIUM) 500 MG chewable tablet Chew 1-2 tablets by mouth daily as needed for indigestion or heartburn.   cetirizine (ZYRTEC) 10 MG tablet Take 10 mg by mouth daily as needed for allergies.   Cholecalciferol (VITAMIN D) 50 MCG (2000 UT) tablet Take 2,000 Units by mouth daily.   diphenhydrAMINE (BENADRYL) 2 % cream Apply 1 application topically 3 (three) times daily as needed for itching.   fluconazole (DIFLUCAN) 150 MG tablet Take 1 tablet po once. May repeat dose in 3 days as needed for persistent symptoms.   glimepiride (AMARYL) 4 MG tablet Take 1 tablet (4 mg total) by mouth 2 (two) times daily.   glucose blood (TRUETRACK TEST) test strip Bloodsugar testing QD and prn   guaifenesin (HUMIBID E) 400 MG TABS tablet Take 400 mg by mouth daily as needed.    metFORMIN (GLUCOPHAGE) 1000 MG tablet Take 1 tablet (1,000 mg total) by mouth 2 (two) times daily with a meal.   mupirocin ointment (BACTROBAN) 2 % Apply small amount to effected area twice daily for 2 weeks then as needed (Patient taking differently: Apply 1 application  topically 2 (two) times daily as needed (sore). Apply small amount to effected area twice daily for 2 weeks then as needed)   nitrofurantoin, macrocrystal-monohydrate, (MACROBID) 100 MG capsule Take 1 capsule (100 mg total) by mouth 2 (two) times daily.   oxybutynin (DITROPAN-XL) 10 MG 24 hr tablet Take 1 tablet (10 mg total) by mouth at bedtime.   pantoprazole (PROTONIX) 40 MG tablet Take 1 tablet (40 mg total) by mouth daily.   rOPINIRole (REQUIP) 0.5 MG tablet Take 1-2 tablets (0.5-1 mg total) by mouth at bedtime.   saxagliptin HCl (ONGLYZA) 5 MG TABS tablet Take 1 tablet (5 mg total) by mouth daily.   [DISCONTINUED] celecoxib (CELEBREX) 200 MG capsule Take 1 capsule (200 mg total) by mouth 2 (two) times daily as needed.   [DISCONTINUED] valsartan (DIOVAN) 160 MG tablet Take 1 tablet by mouth once daily   No facility-administered medications prior to visit.    Review of Systems  Constitutional:  Positive for fatigue. Negative for activity change, appetite change, chills and fever.  HENT:  Negative for congestion, postnasal drip, rhinorrhea, sinus pressure, sinus pain, sneezing and sore throat.   Eyes: Negative.   Respiratory:  Negative for  cough, chest tightness, shortness of breath and wheezing.   Cardiovascular:  Negative for chest pain and palpitations.  Gastrointestinal:  Positive for abdominal pain. Negative for constipation, diarrhea, nausea and vomiting.  Endocrine: Negative for cold intolerance, heat intolerance, polydipsia and polyuria.       Slowly improving blood sugars.   Genitourinary:  Negative for dyspareunia, dysuria, flank pain, frequency and urgency.  Musculoskeletal:  Negative for arthralgias, back pain  and myalgias.  Skin:  Negative for rash.  Allergic/Immunologic: Negative for environmental allergies.  Neurological:  Negative for dizziness, weakness and headaches.  Hematological:  Negative for adenopathy.  Psychiatric/Behavioral:  The patient is not nervous/anxious.     Last CBC Lab Results  Component Value Date   WBC 7.2 07/14/2022   HGB 12.9 07/14/2022   HCT 39.0 07/14/2022   MCV 82 07/14/2022   MCH 27.0 07/14/2022   RDW 14.6 07/14/2022   PLT 248 71/24/5809   Last metabolic panel Lab Results  Component Value Date   GLUCOSE 237 (H) 07/14/2022   NA 140 07/14/2022   K 4.6 07/14/2022   CL 100 07/14/2022   CO2 23 07/14/2022   BUN 18 07/14/2022   CREATININE 1.06 (H) 07/14/2022   EGFR 59 (L) 07/14/2022   CALCIUM 9.2 07/14/2022   PROT 8.0 07/14/2022   ALBUMIN 4.1 07/14/2022   LABGLOB 3.9 07/14/2022   AGRATIO 1.1 (L) 07/14/2022   BILITOT 0.4 07/14/2022   ALKPHOS 152 (H) 07/14/2022   AST 13 07/14/2022   ALT 7 07/14/2022   ANIONGAP 10 08/18/2021   Last lipids Lab Results  Component Value Date   CHOL 161 12/02/2021   HDL 49 12/02/2021   LDLCALC 83 12/02/2021   TRIG 171 (H) 12/02/2021   CHOLHDL 3.3 12/02/2021   Last hemoglobin A1c Lab Results  Component Value Date   HGBA1C 8.5 11/03/2022   Last thyroid functions Lab Results  Component Value Date   TSH 2.020 07/14/2022       Objective     Today's Vitals   11/03/22 1616  BP: 129/80  Pulse: 80  SpO2: 95%  Weight: 205 lb 1.9 oz (93 kg)  Height: _0  (1.803 m)   Body mass index is 28.61 kg/m.  BP Readings from Last 3 Encounters:  11/03/22 129/80  07/21/22 126/78  07/13/22 (Abnormal) 157/82    Wt Readings from Last 3 Encounters:  11/03/22 205 lb 1.9 oz (93 kg)  07/21/22 202 lb 12.8 oz (92 kg)  07/13/22 198 lb (89.8 kg)    Physical Exam Vitals and nursing note reviewed.  Constitutional:      Appearance: Normal appearance. She is well-developed.  HENT:     Head: Normocephalic and  atraumatic.     Nose: Nose normal.     Mouth/Throat:     Mouth: Mucous membranes are moist.     Pharynx: Oropharynx is clear.  Eyes:     Extraocular Movements: Extraocular movements intact.     Conjunctiva/sclera: Conjunctivae normal.     Pupils: Pupils are equal, round, and reactive to light.  Cardiovascular:     Rate and Rhythm: Normal rate and regular rhythm.     Pulses: Normal pulses.     Heart sounds: Normal heart sounds.  Pulmonary:     Effort: Pulmonary effort is normal.     Breath sounds: Normal breath sounds.  Abdominal:     General: Bowel sounds are normal. There is distension.     Palpations: Abdomen is soft. There is no mass.  Tenderness: There is abdominal tenderness. There is no right CVA tenderness, left CVA tenderness, guarding or rebound.     Hernia: No hernia is present.  Musculoskeletal:        General: Normal range of motion.     Cervical back: Normal range of motion and neck supple.  Lymphadenopathy:     Cervical: No cervical adenopathy.  Skin:    General: Skin is warm and dry.     Capillary Refill: Capillary refill takes less than 2 seconds.  Neurological:     General: No focal deficit present.     Mental Status: She is alert and oriented to person, place, and time.  Psychiatric:        Mood and Affect: Mood normal.        Behavior: Behavior normal.        Thought Content: Thought content normal.        Judgment: Judgment normal.     Results for orders placed or performed in visit on 11/03/22  POCT glycosylated hemoglobin (Hb A1C)  Result Value Ref Range   Hemoglobin A1C     HbA1c POC (<> result, manual entry) 8.5 4.0 - 5.6 %   HbA1c, POC (prediabetic range)     HbA1c, POC (controlled diabetic range)    POCT UA - Microalbumin  Result Value Ref Range   Microalbumin Ur, POC 150 mg/L   Creatinine, POC 100 mg/dL   Albumin/Creatinine Ratio, Urine, POC 30-300     Assessment & Plan    1. Type 2 diabetes mellitus with hyperglycemia, without  long-term current use of insulin (HCC) HgbA1c 8.5 today, down from 9.5 at most recent check. Abnormal urine microalbumin. Start segluramet 7.03/999 mg twice daily and other diabetic medication as prescribed  - POCT glycosylated hemoglobin (Hb A1C) - POCT UA - Microalbumin - Ertugliflozin-metFORMIN HCl 7.03-999 MG TABS; Take 1 tablet by mouth 2 (two) times daily.  Dispense: 60 tablet; Refill: 3  2. Hypertension associated with diabetes ([place]) Bp stable. Continue losartan as prescribed  - POCT glycosylated hemoglobin (Hb A1C) - POCT UA - Microalbumin - valsartan (DIOVAN) 160 MG tablet; Take 1 tablet (160 mg total) by mouth daily.  Dispense: 90 tablet; Refill: 1  3. Hyperlipidemia associated with type 2 diabetes mellitus (HCC) Continue atorvastatin as prescribed  - POCT glycosylated hemoglobin (Hb A1C) - POCT UA - Microalbumin  4. Inflammatory polyarthritis ([place]) May take celebrex 200 mg twice daily as needed for pain/inflammation.  - celecoxib (CELEBREX) 200 MG capsule; Take 1 capsule (200 mg total) by mouth 2 (two) times daily as needed.  Dispense: 45 capsule; Refill: 2  5. Chronic left-sided low back pain with left-sided sciatica May take celebrex 200 mg twice daily as needed for pain/inflammation.  - celecoxib (CELEBREX) 200 MG capsule; Take 1 capsule (200 mg total) by mouth 2 (two) times daily as needed.  Dispense: 45 capsule; Refill: 2  6. Chest pain, unspecified type New referral made to  cardiology based on abnormal EKG at most recent visit.  - Ambulatory referral to Cardiology  7. Abnormal ECG New referral made to  cardiology based on abnormal EKG at most recent visit.  - Ambulatory referral to Cardiology  8. Screening for colon cancer Patient to make appointment with her GI provider to set up colonoscopy.   9. Need for influenza vaccination Flu vaccine administered during today's visit.  - Flu Vaccine QUAD 6+ mos PF IM (Fluarix Quad PF)  10. Encounter for screening  mammogram for malignant neoplasm  of breast Screening mammogram ordered today  - MM 3D SCREEN BREAST BILATERAL; Future   Problem List Items Addressed This Visit       Cardiovascular and Mediastinum   Hypertension associated with diabetes ([place])   Relevant Medications   valsartan (DIOVAN) 160 MG tablet   Ertugliflozin-metFORMIN HCl 7.03-999 MG TABS   Other Relevant Orders   POCT glycosylated hemoglobin (Hb A1C) (Completed)   POCT UA - Microalbumin (Completed)     Endocrine   Hyperlipidemia associated with type 2 diabetes mellitus (HCC)   Relevant Medications   valsartan (DIOVAN) 160 MG tablet   Ertugliflozin-metFORMIN HCl 7.03-999 MG TABS   Other Relevant Orders   POCT glycosylated hemoglobin (Hb A1C) (Completed)   POCT UA - Microalbumin (Completed)   Type 2 diabetes mellitus with hyperglycemia, without long-term current use of insulin (HCC) - Primary   Relevant Medications   valsartan (DIOVAN) 160 MG tablet   Ertugliflozin-metFORMIN HCl 7.03-999 MG TABS   Other Relevant Orders   POCT glycosylated hemoglobin (Hb A1C) (Completed)   POCT UA - Microalbumin (Completed)     Nervous and Auditory   Chronic left-sided low back pain with left-sided sciatica   Relevant Medications   celecoxib (CELEBREX) 200 MG capsule     Musculoskeletal and Integument   Inflammatory polyarthritis (HCC)   Relevant Medications   celecoxib (CELEBREX) 200 MG capsule     Other   Chest pain   Relevant Orders   Ambulatory referral to Cardiology   Abnormal ECG   Relevant Orders   Ambulatory referral to Cardiology   Other Visit Diagnoses     Screening for colon cancer       Need for influenza vaccination       Relevant Orders   Flu Vaccine QUAD 6+ mos PF IM (Fluarix Quad PF) (Completed)   Encounter for screening mammogram for malignant neoplasm of breast       Relevant Orders   MM 3D SCREEN BREAST BILATERAL        Return in about 3 months (around 02/02/2023) for health maintenance exam, FBW at  time of visit.         Ronnell Freshwater, NP  [facility] [phone] (phone) [phone] (fax)  [place]

## 2022-11-10 NOTE — Progress Notes (Deleted)
Cardiology Office Note:    Date:  11/10/2022   ID:  Danielle Hurley, DOB 04-04-1961, MRN [id number]  PCP:  Carlean Jews, NP   [facility] Providers Cardiologist:  None { Click to update primary MD,subspecialty MD or APP then REFRESH:1}    Referring MD: Carlean Jews, NP   No chief complaint on file. CP  History of Present Illness:    Danielle Hurley is a 61 y.o. female with a hx of DM2, HTN, HLD, chronic hep C s/p treatment, COPD, TB s/p treatment  referral for ***?CP and an abnormal EKG. NSR with possible septal Q.  Past Medical History:  Diagnosis Date   Arthritis    COPD (chronic obstructive pulmonary disease) (HCC)    Diabetes mellitus without complication (HCC)    Dyspnea    GERD (gastroesophageal reflux disease)    Hepatitis C    treated 2022   High cholesterol    History of kidney stones    Hypertension    Tuberculosis    non contagious Dx 05/2020    Past Surgical History:  Procedure Laterality Date   CHOLECYSTECTOMY  1998   TUBAL LIGATION  01/1995   VIDEO BRONCHOSCOPY WITH ENDOBRONCHIAL NAVIGATION N/A 09/04/2021   Procedure: VIDEO BRONCHOSCOPY WITH ENDOBRONCHIAL NAVIGATION;  Surgeon: Loreli Slot, MD;  Location: [facility] OR;  Service: Thoracic;  Laterality: N/A;    Current Medications: No outpatient medications have been marked as taking for the 11/12/22 encounter (Appointment) with Maisie Fus, MD.     Allergies:   Patient has no known allergies.   Social History   Socioeconomic History   Marital status: Widowed    Spouse name: Not on file   Number of children: 4   Years of education: Not on file   Highest education level: 12th grade  Occupational History   Not on file  Tobacco Use   Smoking status: Former    Packs/day: 1.00    Years: 40.00    Total pack years: 40.00    Types: Cigarettes    Quit date: 2014    Years since quitting: 9.9   Smokeless tobacco: Never  Vaping Use   Vaping Use: Never used  Substance and Sexual  Activity   Alcohol use: Not Currently    Comment: occasional   Drug use: Not Currently   Sexual activity: Not Currently  Other Topics Concern   Not on file  Social History Narrative   Not on file   Social Determinants of Health   Financial Resource Strain: Not on file  Food Insecurity: Not on file  Transportation Needs: Not on file  Physical Activity: Not on file  Stress: Not on file  Social Connections: Not on file     Family History: The patient's ***family history is negative for Colon cancer, Stomach cancer, Esophageal cancer, and Colon polyps. She was adopted.  ROS:   Please see the history of present illness.    *** All other systems reviewed and are negative.  EKGs/Labs/Other Studies Reviewed:    The following studies were reviewed today: ***  EKG:  EKG is *** ordered today.  The ekg ordered today demonstrates ***  Recent Labs: 07/14/2022: ALT 7; BUN 18; Creatinine, Ser 1.06; Hemoglobin 12.9; Platelets 248; Potassium 4.6; Sodium 140; TSH 2.020  Recent Lipid Panel    Component Value Date/Time   CHOL 161 12/02/2021 1020   TRIG 171 (H) 12/02/2021 1020   HDL 49 12/02/2021 1020   CHOLHDL 3.3 12/02/2021 1020  LDLCALC 83 12/02/2021 1020     Risk Assessment/Calculations:   {Does this patient have ATRIAL FIBRILLATION?:[phone]}  No BP recorded.  {Refresh Note OR Click here to enter BP  :1}***         Physical Exam:    VS:  There were no vitals taken for this visit.    Wt Readings from Last 3 Encounters:  11/03/22 205 lb 1.9 oz (93 kg)  07/21/22 202 lb 12.8 oz (92 kg)  07/13/22 198 lb (89.8 kg)     GEN: *** Well nourished, well developed in no acute distress HEENT: Normal NECK: No JVD; No carotid bruits LYMPHATICS: No lymphadenopathy CARDIAC: ***RRR, no murmurs, rubs, gallops RESPIRATORY:  Clear to auscultation without rales, wheezing or rhonchi  ABDOMEN: Soft, non-tender, non-distended MUSCULOSKELETAL:  No edema; No deformity  SKIN: Warm and  dry NEUROLOGIC:  Alert and oriented x 3 PSYCHIATRIC:  Normal affect   ASSESSMENT:    No diagnosis found. PLAN:    In order of problems listed above:  ***      {Are you ordering a CV Procedure (e.g. stress test, cath, DCCV, TEE, etc)?   Press F2        :[id number]}    Medication Adjustments/Labs and Tests Ordered: Current medicines are reviewed at length with the patient today.  Concerns regarding medicines are outlined above.  No orders of the defined types were placed in this encounter.  No orders of the defined types were placed in this encounter.   There are no Patient Instructions on file for this visit.   Signed, Maisie Fus, MD  11/10/2022 3:32 PM    [facility]

## 2022-11-12 ENCOUNTER — Ambulatory Visit: Payer: Commercial Managed Care - HMO | Admitting: Internal Medicine

## 2022-11-19 ENCOUNTER — Ambulatory Visit: Payer: Commercial Managed Care - HMO | Admitting: Internal Medicine

## 2022-11-27 ENCOUNTER — Other Ambulatory Visit: Payer: Self-pay | Admitting: Nurse Practitioner

## 2022-11-27 DIAGNOSIS — N3281 Overactive bladder: Secondary | ICD-10-CM

## 2022-12-01 ENCOUNTER — Telehealth: Payer: Self-pay

## 2022-12-01 ENCOUNTER — Other Ambulatory Visit: Payer: Self-pay | Admitting: Nurse Practitioner

## 2022-12-01 DIAGNOSIS — E1165 Type 2 diabetes mellitus with hyperglycemia: Secondary | ICD-10-CM

## 2022-12-01 MED ORDER — SYNJARDY XR 25-1000 MG PO TB24: 1.0000 | ORAL_TABLET | Freq: Every day | ORAL | 3 refills | Status: DC

## 2022-12-01 NOTE — Telephone Encounter (Signed)
I changed the prescription back to the Synjardy XR 25/1000 mg daily and sent new prescription to [facility].  Thanks so much.   -HB

## 2022-12-01 NOTE — Telephone Encounter (Signed)
Segluromet 7.5-100 mg is not a preferred formulary. The preferred formularies are; Metformin ER, Synjardy, and Synjardy XR.

## 2022-12-03 IMAGING — CR DG CHEST 2V
2 series · 2 of 2 positions shown · non-contrast
Comparison: 08/18/2021.  CT, 08/05/2021.

CLINICAL DATA: Preop for video assisted bronchoscopy.

EXAM:
CHEST - 2 VIEW

[w chest pa]
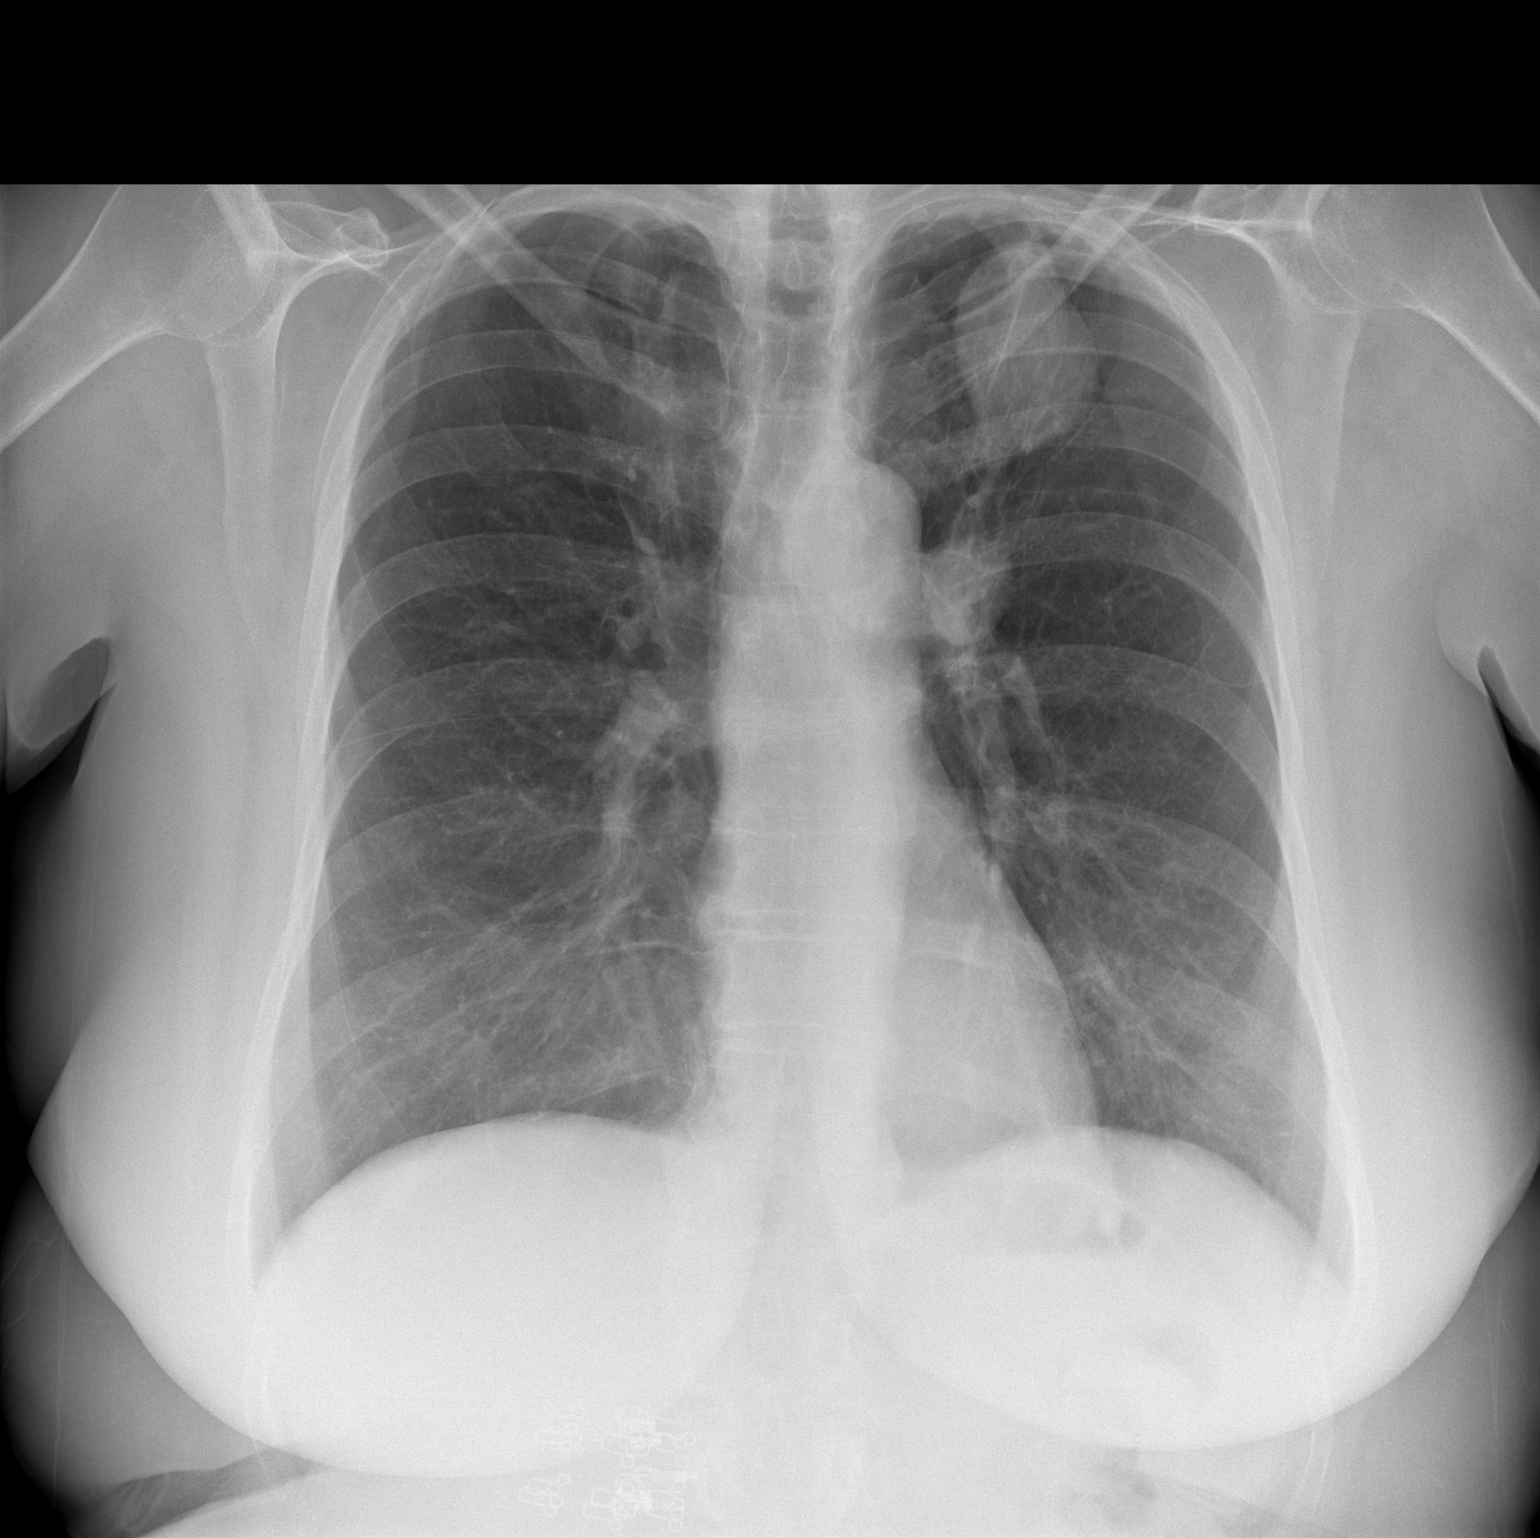

[w chest lat]
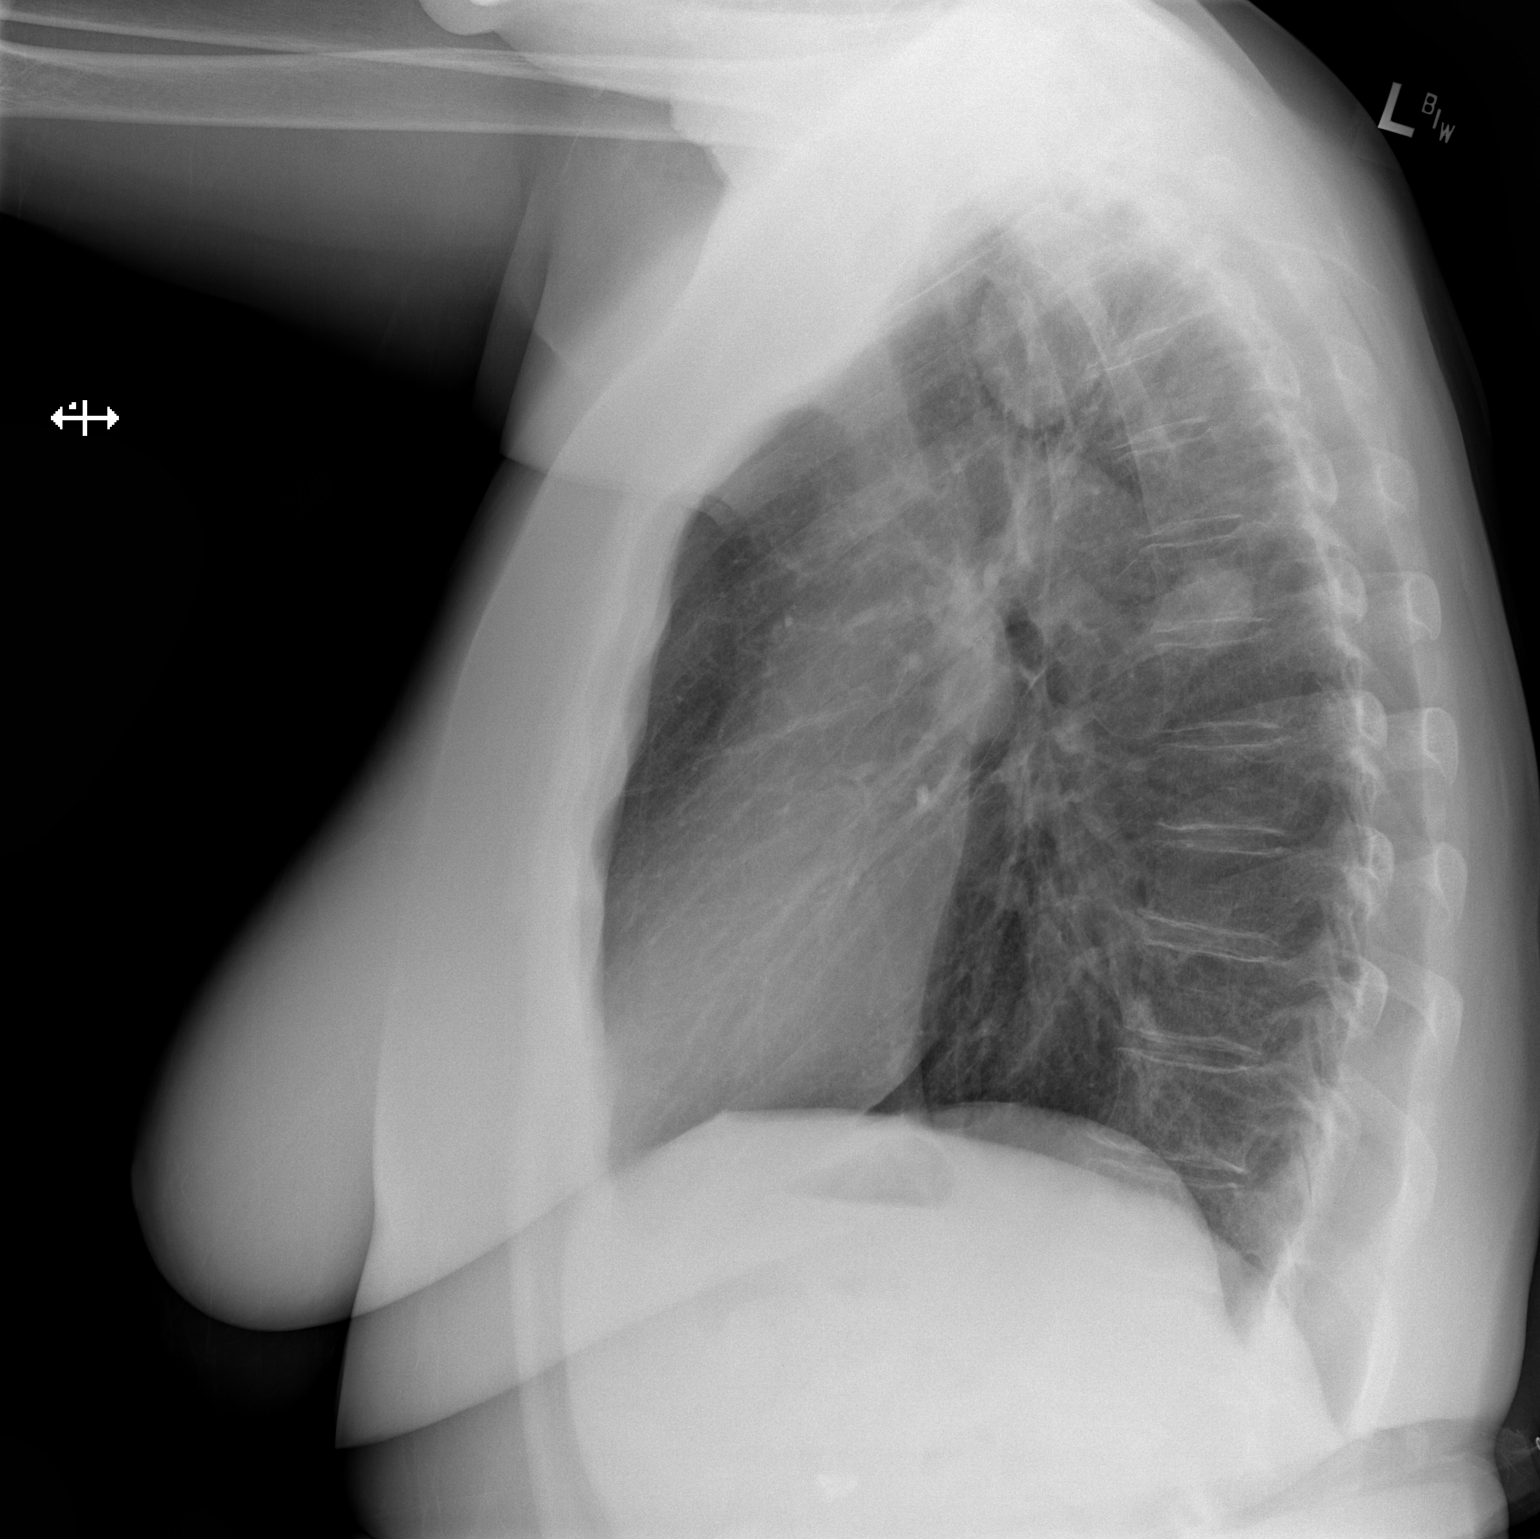

[2 of 2 positions shown; findings below may reference images not displayed]

FINDINGS: Cardiac silhouette is normal in size. No mediastinal or hilar
masses.

Large mass in the left upper lobe near the apex and smaller nodule
projecting in the superior segment of the left lower lobe are
stable. Nodule in the medial right upper lobe is less well-defined
than on the prior CT, but is unchanged radiographically. No new lung
masses. No evidence of pneumonia or pulmonary edema.

No pleural effusion or pneumothorax.

Skeletal structures are intact.
IMPRESSION: 1. No acute cardiopulmonary disease.
2. Lung masses and nodules stable compared to the recent prior
exams.

## 2022-12-15 ENCOUNTER — Ambulatory Visit: Payer: Commercial Managed Care - HMO | Admitting: Internal Medicine

## 2022-12-28 ENCOUNTER — Telehealth: Payer: Self-pay | Admitting: *Deleted

## 2022-12-28 NOTE — Telephone Encounter (Signed)
Pt calling requesting refill on below.  She would like 90 day supply on both.   oxybutynin (DITROPAN-XL) 10 MG 24 hr tablet    pantoprazole (PROTONIX) 40 MG tablet    [place] ([place]), [place] [facility] [place]

## 2022-12-31 ENCOUNTER — Other Ambulatory Visit: Payer: Self-pay | Admitting: Nurse Practitioner

## 2022-12-31 ENCOUNTER — Ambulatory Visit: Payer: Commercial Managed Care - HMO

## 2022-12-31 DIAGNOSIS — N3281 Overactive bladder: Secondary | ICD-10-CM

## 2023-01-05 ENCOUNTER — Other Ambulatory Visit: Payer: Self-pay

## 2023-01-05 DIAGNOSIS — K219 Gastro-esophageal reflux disease without esophagitis: Secondary | ICD-10-CM

## 2023-01-05 DIAGNOSIS — E119 Type 2 diabetes mellitus without complications: Secondary | ICD-10-CM

## 2023-01-05 MED ORDER — GLIMEPIRIDE 4 MG PO TABS: 4.0000 mg | ORAL_TABLET | Freq: Two times a day (BID) | ORAL | 1 refills | Status: DC

## 2023-01-05 MED ORDER — PANTOPRAZOLE SODIUM 40 MG PO TBEC: 40.0000 mg | DELAYED_RELEASE_TABLET | Freq: Every day | ORAL | 1 refills | Status: DC

## 2023-01-05 NOTE — Telephone Encounter (Signed)
Pt is requesting a refill on: glimepiride (AMARYL) 4 MG tablet  pantoprazole (PROTONIX) 40 MG tablet   LOV 11/03/22 ROV 02/02/23

## 2023-01-05 NOTE — Telephone Encounter (Signed)
NOT NEEDED 

## 2023-01-05 NOTE — Telephone Encounter (Signed)
Rx's were sent to the [facility] on [address]

## 2023-01-08 ENCOUNTER — Other Ambulatory Visit: Payer: Self-pay | Admitting: Nurse Practitioner

## 2023-01-08 DIAGNOSIS — G8929 Other chronic pain: Secondary | ICD-10-CM

## 2023-01-08 DIAGNOSIS — M064 Inflammatory polyarthropathy: Secondary | ICD-10-CM

## 2023-01-12 ENCOUNTER — Telehealth: Payer: Self-pay

## 2023-01-12 NOTE — Telephone Encounter (Signed)
Is this pt currently taking Synjardy I check Cover My Med and this Rx has expired

## 2023-01-14 ENCOUNTER — Other Ambulatory Visit: Payer: Commercial Managed Care - HMO | Admitting: Pharmacist

## 2023-01-14 NOTE — Patient Instructions (Signed)
Danielle Hurley,  I contacted your pharmacy and they explained the issues between [place] and your Medicaid insurance.   They recommended you start with your medicaid case worker, to assist in creating medicaid as your primary coverage. Christella Scheuermann will need resolved - right now, medicaid is requiring your medicines are first run through [employer], but Christella Scheuermann is not covering due to nonpayment of [employer]. I understand this is a challenging situation!  I am also going to put you in contact with our case management team with [facility] so that we can get insurance straightened out, in order to move forward with getting your medicines properly filled at the pharmacy.  I'll be in touch by phone in a few weeks to see how everything is going.  Take care, Luana Shu, PharmD Clinical Pharmacist [facility]

## 2023-01-14 NOTE — Progress Notes (Signed)
01/14/2023 Name: Danielle Hurley MRN: [id number] DOB: Sep 11, 1961   Danielle Hurley is a 62 y.o. year old female who presented for a telephone visit.   They were referred to the pharmacist by a quality report for assistance in managing diabetes.    Patient experiencing significant barriers to medication access due to: having [employer] as primary insurance, though she has not paid her premium/wants to discontinue coverage, and medicaid as secondary, BUT unable to access medications via medicaid because it is requiring meds filled through primary [facility]) which is declining due to nonpayment of premium.  Med review: Metformin 1036m BID - not taking synjardy XR, many medication access issues at pharmacy Atorvastatin 239m- taking half tablet daily, these directions are from [place] old [facility] ([place] [facility] [place])  Patient is completely out of: Oxybutynin 105maily, $18 on discount card at her pharmacy Saxagliptin 5mg54mily, $52 on discount card at her pharmacy  About empty: Valsartan  Glimepiride  Patient is participating in a Managed Medicaid Plan:  Yes  Subjective:  Care Team: Primary Care Provider: BoscRonnell Freshwater   Medication Access/Adherence  [facility] Pharmacy:  [facility] [place] [id number] [place] ([place]) [place] [id number]: [phone]: [phone] [facility] [address] [facility] [id number]: [phone]: [phone] reports affordability concerns with their medications: Yes  Patient reports access/transportation concerns to their pharmacy: Yes  Patient reports adherence concerns with their medications:  Yes     Diabetes:  Current medications: metformin 1000mg44m, glimepiride 4mg B7m Medications tried in the past:   Current glucose readings: not  currently checking, patient cannot find her supplies and is out of test strips.    Objective:  Lab Results  Component Value Date   HGBA1C 8.5 11/03/2022    Lab Results  Component Value Date   CREATININE 1.06 (H) 07/14/2022   BUN 18 07/14/2022   NA 140 07/14/2022   K 4.6 07/14/2022   CL 100 07/14/2022   CO2 23 07/14/2022    Lab Results  Component Value Date   CHOL 161 12/02/2021   HDL 49 12/02/2021   LDLCALC 83 12/02/2021   TRIG 171 (H) 12/02/2021   CHOLHDL 3.3 12/02/2021    Medications Reviewed Today     Reviewed by Sameeha Rockefeller,Darius Bump([facility]) on 01/14/23 at 1732  GilmanStatus: <None>   Medication Order Taking? Sig Documenting Provider Last Dose Status Informant  aspirin-acetaminophen-caffeine (EXCEDRIN MIGRAINE) 250-250-65 MG tablet [id number] 1 tablet by mouth every 6 (six) hours as needed for headache. [provider] Taking Active Self  atorvastatin (LIPITOR) 20 MG tablet [id number] 1 tablet (20 mg total) by mouth daily. BosciaRonnell Freshwateraking Active   Bismuth Subsalicylate (KAOPECTATE) 262 MG TABS [id number] 262 mg by mouth daily as needed (upset stomach). [provider] Taking Active Self  blood glucose meter kit and supplies [id number] based on patient and insurance preference. Use up to four times daily as directed. (FOR ICD-10 E10.9, E11.9). BosciaRonnell Freshwateraking Active   calcium carbonate (TUMS - DOSED IN MG ELEMENTAL CALCIUM) 500 MG chewable tablet [id number] 1-2 tablets by mouth daily as needed for indigestion or heartburn. [provider] Taking Active Self  celecoxib (CELEBREX) 200 MG  capsule [id number] Yes TAKE 1 CAPSULE BY MOUTH TWICE DAILY AS NEEDED Boscia, Heather E, NP Taking Active   cetirizine (ZYRTEC) 10 MG tablet [id number] Yes Take 10 mg by mouth daily as needed for allergies. [provider] Taking Active Self  Cholecalciferol (VITAMIN D) 50 MCG (2000  UT) tablet [id number] Yes Take 2,000 Units by mouth daily. [provider] Taking Active Self  diphenhydrAMINE (BENADRYL) 2 % cream [id number] Yes Apply 1 application topically 3 (three) times daily as needed for itching. [provider] Taking Active Self  Empagliflozin-metFORMIN HCl ER (SYNJARDY XR) 25-1000 MG TB24 [id number]  Take 1 tablet by mouth daily. Ronnell Freshwater, NP  Active   glimepiride (AMARYL) 4 MG tablet [id number] Yes Take 1 tablet (4 mg total) by mouth 2 (two) times daily. Ronnell Freshwater, NP Taking Active   glucose blood (TRUETRACK TEST) test strip [id number] Yes Bloodsugar testing QD and prn Ronnell Freshwater, NP Taking Active   guaifenesin (HUMIBID E) 400 MG TABS tablet [id number] No Take 400 mg by mouth daily as needed.  Patient not taking: Reported on 01/14/2023   [provider] Not Taking Active Self  mupirocin ointment (BACTROBAN) 2 % [id number] No Apply small amount to effected area twice daily for 2 weeks then as needed  Patient not taking: Reported on 01/14/2023   Ronnell Freshwater, NP Not Taking Active Self  oxybutynin (DITROPAN-XL) 10 MG 24 hr tablet [id number] No TAKE 1 TABLET BY MOUTH AT BEDTIME  Patient not taking: Reported on 01/14/2023   Ronnell Freshwater, NP Not Taking Active   pantoprazole (PROTONIX) 40 MG tablet [id number] Yes Take 1 tablet (40 mg total) by mouth daily. Ronnell Freshwater, NP Taking Active   rOPINIRole (REQUIP) 0.5 MG tablet [id number] Yes Take 1-2 tablets (0.5-1 mg total) by mouth at bedtime. Ronnell Freshwater, NP Taking Active   saxagliptin HCl (ONGLYZA) 5 MG TABS tablet [id number] No Take 1 tablet (5 mg total) by mouth daily.  Patient not taking: Reported on 01/14/2023   Ronnell Freshwater, NP Not Taking Active   valsartan (DIOVAN) 160 MG tablet [id number] Yes Take 1 tablet (160 mg total) by mouth daily. Ronnell Freshwater, NP Taking Active               Assessment/Plan:   Diabetes: - Currently uncontrolled -  Recommend to continue current medications for now, will connect with support staff for managed medicaid to assist with resolving insurance issues  with [employer] + medicaid to improve medication access.     Follow Up Plan: 1-2 weeks  Larinda Buttery, PharmD Clinical Pharmacist [facility] [phone]

## 2023-01-15 ENCOUNTER — Ambulatory Visit: Payer: Commercial Managed Care - HMO | Admitting: Internal Medicine

## 2023-01-19 ENCOUNTER — Other Ambulatory Visit: Payer: Commercial Managed Care - HMO

## 2023-01-19 NOTE — Patient Instructions (Signed)
Visit Information  [name] was given information about Medicaid Managed Care team care coordination services as a part of their [place] Medicaid benefit. [name] verbally consented to engagement with the [facility] Managed Care team.   If you are experiencing a medical emergency, please call 911 or report to your local emergency department or urgent care.   If you have a non-emergency medical problem during routine business hours, please contact your provider's office and ask to speak with a nurse.   For questions related to your [facility], please call: [phone] or visit the homepage here: [email or web address]  If you would like to schedule transportation through your [facility], please call the following number at least 2 days in advance of your appointment: [phone]   Rides for urgent appointments can also be made after hours by calling Member Services.  Call the [place] at [phone], at any time, 24 hours a day, 7 days a week. If you are in danger or need immediate medical attention call 911.  If you would like help to quit smoking, call 1-800-QUIT-NOW 7743087939) OR Espaol: 1-855-Djelo-Ya QO:409462) o para ms informacin haga clic aqu or Text READY to 200-400 to register via text  [name] - following are the goals we discussed in your visit today:   Goals Addressed   None      Social Worker will follow up on 02/17/23.  Mickel Fuchs, BSW, [place] Managed Medicaid Team  [phone]   Following is a copy of your plan of care:  There are no care plans that you recently modified to display for this patient.

## 2023-01-19 NOTE — Patient Outreach (Signed)
Medicaid Managed Care Social Work Note  01/19/2023 Name:  Danielle Hurley MRN:  [id number] DOB:  12/21/1960  Danielle Hurley is an 62 y.o. year old female who is a primary patient of Danielle Hurley, Greer Ee, NP.  The [facility] Managed Care Coordination team was consulted for assistance with:   cancel coverage  Ms. Burhans was given information about Medicaid Managed Care Coordination team services today. Drucie Ip Patient agreed to services and verbal consent obtained.  Engaged with patient  for by telephone forinitial visit in response to referral for case management and/or care coordination services.   Assessments/Interventions:  Review of past medical history, allergies, medications, health status, including review of consultants reports, laboratory and other test data, was performed as part of comprehensive evaluation and provision of chronic care management services.  SDOH: (Social Determinant of Health) assessments and interventions performed: BSW completed a telephone outreach with patient. She stated she is needing assistance with cancelling her [employer] plan. She has contacted them to cancel but keeps getting the run around. Patient provided BSW with the telephone number for the Marketplace, BSW contacted marketplace with patient on the line. BSW and patient spoke with Zebedee Iba who cancelled the [employer] plan and stated it could take up to 2 weeks for [employer] to terminate the plan, but it usually does not take that long. Patient states no resources are needed at this time.  Advanced Directives Status:  Not addressed in this encounter.  Care Plan                 No Known Allergies  Medications Reviewed Today     Reviewed by Darius Bump, [facility] (Pharmacist) on 01/14/23 at [place] List Status: <None>   Medication Order Taking? Sig Documenting Provider Last Dose Status Informant  aspirin-acetaminophen-caffeine (EXCEDRIN MIGRAINE) 250-250-65 MG tablet [id number] Yes Take 1 tablet by mouth every 6  (six) hours as needed for headache. [provider] Taking Active Self  atorvastatin (LIPITOR) 20 MG tablet [id number] Yes Take 1 tablet (20 mg total) by mouth daily. Ronnell Freshwater, NP Taking Active   Bismuth Subsalicylate (KAOPECTATE) 262 MG TABS [id number] Yes Take 262 mg by mouth daily as needed (upset stomach). [provider] Taking Active Self  blood glucose meter kit and supplies [id number] Yes Dispense based on patient and insurance preference. Use up to four times daily as directed. (FOR ICD-10 E10.9, E11.9). Ronnell Freshwater, NP Taking Active   calcium carbonate (TUMS - DOSED IN MG ELEMENTAL CALCIUM) 500 MG chewable tablet [id number] Yes Chew 1-2 tablets by mouth daily as needed for indigestion or heartburn. [provider] Taking Active Self  celecoxib (CELEBREX) 200 MG capsule [id number] Yes TAKE 1 CAPSULE BY MOUTH TWICE DAILY AS NEEDED Boscia, Heather E, NP Taking Active   cetirizine (ZYRTEC) 10 MG tablet [id number] Yes Take 10 mg by mouth daily as needed for allergies. [provider] Taking Active Self  Cholecalciferol (VITAMIN D) 50 MCG (2000 UT) tablet [id number] Yes Take 2,000 Units by mouth daily. [provider] Taking Active Self  diphenhydrAMINE (BENADRYL) 2 % cream [id number] Yes Apply 1 application topically 3 (three) times daily as needed for itching. [provider] Taking Active Self  Empagliflozin-metFORMIN HCl ER (SYNJARDY XR) 25-1000 MG TB24 [id number]  Take 1 tablet by mouth daily. Ronnell Freshwater, NP  Active   glimepiride (AMARYL) 4 MG tablet [id number] Yes Take 1 tablet (4 mg total) by mouth 2 (two) times daily. Ronnell Freshwater, NP Taking Active  glucose blood (TRUETRACK TEST) test strip [id number] Yes Bloodsugar testing QD and prn Ronnell Freshwater, NP Taking Active   guaifenesin (HUMIBID E) 400 MG TABS tablet [id number] No Take 400 mg by mouth daily as needed.  Patient not taking: Reported on 01/14/2023   [provider] Not Taking Active Self  mupirocin ointment (BACTROBAN) 2 % [id number] No Apply small amount to effected area twice daily for 2 weeks then as needed  Patient not taking: Reported on 01/14/2023   Ronnell Freshwater, NP Not Taking Active Self  oxybutynin (DITROPAN-XL) 10 MG 24 hr tablet [id number] No TAKE 1 TABLET BY MOUTH AT BEDTIME  Patient not taking: Reported on 01/14/2023   Ronnell Freshwater, NP Not Taking Active   pantoprazole (PROTONIX) 40 MG tablet [id number] Yes Take 1 tablet (40 mg total) by mouth daily. Ronnell Freshwater, NP Taking Active   rOPINIRole (REQUIP) 0.5 MG tablet [id number] Yes Take 1-2 tablets (0.5-1 mg total) by mouth at bedtime. Ronnell Freshwater, NP Taking Active   saxagliptin HCl (ONGLYZA) 5 MG TABS tablet [id number] No Take 1 tablet (5 mg total) by mouth daily.  Patient not taking: Reported on 01/14/2023   Ronnell Freshwater, NP Not Taking Active   valsartan (DIOVAN) 160 MG tablet [id number] Yes Take 1 tablet (160 mg total) by mouth daily. Ronnell Freshwater, NP Taking Active             Patient Active Problem List   Diagnosis Date Noted   Chest pain 07/16/2022   Abnormal ECG 07/16/2022   Chronic left-sided low back pain with left-sided sciatica 07/16/2022   Restless leg syndrome 07/16/2022   Leg cramps 07/16/2022   Abnormal weight gain 07/16/2022   Type 2 diabetes mellitus with hyperglycemia, without long-term current use of insulin ([place]) 05/31/2022   Urinary tract infection with hematuria 05/31/2022   Gastroesophageal reflux disease without esophagitis 05/31/2022   Inflammatory polyarthritis ([place]) 05/31/2022   Cancer screening 05/19/2022   Overactive bladder 11/02/2021   Hepatic cirrhosis ([place]) 10/21/2021   Immunization counseling 10/21/2021   Pulmonary nodule 08/19/2021   Body mass index 27.0-27.9, adult 08/19/2021   Need for hepatitis B vaccination 07/22/2021   Cellulitis of ear, right 07/08/2021   TB (pulmonary tuberculosis) 06/13/2021    Medication monitoring encounter 06/13/2021   Upper airway cough syndrome 05/08/2021   TB of lung w/ cavitation 05/08/2021   Hypertension associated with diabetes ([place]) 03/31/2021   Diabetes mellitus without complication ([place]) [id number]   Hyperlipidemia associated with type 2 diabetes mellitus ([place]) 03/31/2021   Encounter to establish care 03/31/2021   History of active tuberculosis 03/31/2021   Wheezing 03/31/2021   Chronic hepatitis C without hepatic coma ([place]) 03/31/2021   Chronic left shoulder pain 03/31/2021   Dysuria 03/31/2021   Vaginal yeast infection 03/31/2021    Conditions to be addressed/monitored per PCP order:   health plan  There are no care plans that you recently modified to display for this patient.   Follow up:  Patient agrees to Care Plan and Follow-up.  Plan: The Managed Medicaid care management team will reach out to the patient again over the next 30 days.  Date/time of next scheduled Social Work care management/care coordination outreach:  02/17/23  Mickel Fuchs, Arita Miss, [place] Medicaid Team  [phone]

## 2023-02-02 ENCOUNTER — Ambulatory Visit: Payer: Commercial Managed Care - HMO | Admitting: Nurse Practitioner

## 2023-02-07 ENCOUNTER — Other Ambulatory Visit: Payer: Self-pay | Admitting: Nurse Practitioner

## 2023-02-07 DIAGNOSIS — N3281 Overactive bladder: Secondary | ICD-10-CM

## 2023-02-07 DIAGNOSIS — E1165 Type 2 diabetes mellitus with hyperglycemia: Secondary | ICD-10-CM

## 2023-02-13 ENCOUNTER — Ambulatory Visit
Admission: EM | Admit: 2023-02-13 | Discharge: 2023-02-13 | Disposition: A | Discharge status: Home / Self Care | Payer: Medicaid Other | Attending: Emergency Medicine | Admitting: Emergency Medicine

## 2023-02-13 DIAGNOSIS — T148XXA Other injury of unspecified body region, initial encounter: Secondary | ICD-10-CM | POA: Diagnosis not present

## 2023-02-13 DIAGNOSIS — L03115 Cellulitis of right lower limb: Secondary | ICD-10-CM

## 2023-02-13 DIAGNOSIS — L089 Local infection of the skin and subcutaneous tissue, unspecified: Secondary | ICD-10-CM

## 2023-02-13 MED ORDER — DOXYCYCLINE HYCLATE 100 MG PO CAPS: 100.0000 mg | ORAL_CAPSULE | Freq: Two times a day (BID) | ORAL | 0 refills | Status: DC

## 2023-02-13 MED ORDER — CEPHALEXIN 500 MG PO CAPS: 500.0000 mg | ORAL_CAPSULE | Freq: Four times a day (QID) | ORAL | 0 refills | Status: DC

## 2023-02-13 NOTE — ED Provider Notes (Signed)
[name]   [id number] 02/13/23 Arrival Time: 39   Chief Complaint  Patient presents with   Wound Check     SUBJECTIVE: History from: patient.  Danielle Hurley is a 62 y.o. female who presented to the urgent care with a complaint of wound to right lower leg for the past 6 days.  Developed the symptom after falling on a screwdriver.  Has tried OTC antibiotic ointment without relief.  Denies similar symptoms in the past.  Currently denies chills, fever, nausea, vomiting, diarrhea.   ROS: As per HPI.  All other pertinent ROS negative.      Past Medical History:  Diagnosis Date   Arthritis    COPD (chronic obstructive pulmonary disease) ([place])    Diabetes mellitus without complication ([place])    Dyspnea    GERD (gastroesophageal reflux disease)    Hepatitis C    treated 2022   High cholesterol    History of kidney stones    Hypertension    Tuberculosis    non contagious Dx 05/2020   Past Surgical History:  Procedure Laterality Date   CHOLECYSTECTOMY  1998   TUBAL LIGATION  01/1995   VIDEO BRONCHOSCOPY WITH ENDOBRONCHIAL NAVIGATION N/A 09/04/2021   Procedure: VIDEO BRONCHOSCOPY WITH ENDOBRONCHIAL NAVIGATION;  Surgeon: Melrose Nakayama, MD;  Location: [facility] OR;  Service: Thoracic;  Laterality: N/A;   No Known Allergies No current facility-administered medications on file prior to encounter.   Current Outpatient Medications on File Prior to Encounter  Medication Sig Dispense Refill   aspirin-acetaminophen-caffeine (EXCEDRIN MIGRAINE) 250-250-65 MG tablet Take 1 tablet by mouth every 6 (six) hours as needed for headache.     atorvastatin (LIPITOR) 20 MG tablet Take 1 tablet (20 mg total) by mouth daily. 90 tablet 1   Bismuth Subsalicylate (KAOPECTATE) 262 MG TABS Take 262 mg by mouth daily as needed (upset stomach).     blood glucose meter kit and supplies Dispense based on patient and insurance preference. Use up to four times daily as directed. (FOR ICD-10  E10.9, E11.9). 1 each 0   calcium carbonate (TUMS - DOSED IN MG ELEMENTAL CALCIUM) 500 MG chewable tablet Chew 1-2 tablets by mouth daily as needed for indigestion or heartburn.     celecoxib (CELEBREX) 200 MG capsule TAKE 1 CAPSULE BY MOUTH TWICE DAILY AS NEEDED 45 capsule 0   cetirizine (ZYRTEC) 10 MG tablet Take 10 mg by mouth daily as needed for allergies.     Cholecalciferol (VITAMIN D) 50 MCG (2000 UT) tablet Take 2,000 Units by mouth daily.     diphenhydrAMINE (BENADRYL) 2 % cream Apply 1 application topically 3 (three) times daily as needed for itching.     Empagliflozin-metFORMIN HCl ER (SYNJARDY XR) 25-1000 MG TB24 Take 1 tablet by mouth daily. 30 tablet 3   glimepiride (AMARYL) 4 MG tablet Take 1 tablet (4 mg total) by mouth 2 (two) times daily. 180 tablet 1   glucose blood (TRUETRACK TEST) test strip Bloodsugar testing QD and prn 100 each 12   guaifenesin (HUMIBID E) 400 MG TABS tablet Take 400 mg by mouth daily as needed. (Patient not taking: Reported on 01/14/2023)     mupirocin ointment (BACTROBAN) 2 % Apply small amount to effected area twice daily for 2 weeks then as needed (Patient not taking: Reported on 01/14/2023) 22 g 1   oxybutynin (DITROPAN-XL) 10 MG 24 hr tablet TAKE 1 TABLET BY MOUTH AT BEDTIME 30 tablet 0   pantoprazole (PROTONIX) 40 MG tablet Take  1 tablet (40 mg total) by mouth daily. 90 tablet 1   rOPINIRole (REQUIP) 0.5 MG tablet Take 1-2 tablets (0.5-1 mg total) by mouth at bedtime. 45 tablet 2   saxagliptin HCl (ONGLYZA) 5 MG TABS tablet TAKE 1 TABLET BY MOUTH ONCE DAILY *STOP  JANUVIA* 30 tablet 0   valsartan (DIOVAN) 160 MG tablet Take 1 tablet (160 mg total) by mouth daily. 90 tablet 1   Social History   Socioeconomic History   Marital status: Widowed    Spouse name: Not on file   Number of children: 4   Years of education: Not on file   Highest education level: 12th grade  Occupational History   Not on file  Tobacco Use   Smoking status: Former     Packs/day: 1.00    Years: 40.00    Additional pack years: 0.00    Total pack years: 40.00    Types: Cigarettes    Quit date: 2014    Years since quitting: 10.2   Smokeless tobacco: Never  Vaping Use   Vaping Use: Never used  Substance and Sexual Activity   Alcohol use: Not Currently    Comment: occasional   Drug use: Not Currently   Sexual activity: Not Currently  Other Topics Concern   Not on file  Social History Narrative   Not on file   Social Determinants of Health   Financial Resource Strain: Not on file  Food Insecurity: No Food Insecurity (01/19/2023)   Hunger Vital Sign    Worried About Running Out of Food in the Last Year: Never true    Ran Out of Food in the Last Year: Never true  Transportation Needs: No Transportation Needs (01/19/2023)   PRAPARE - Hydrologist (Medical): No    Lack of Transportation (Non-Medical): No  Physical Activity: Not on file  Stress: Not on file  Social Connections: Not on file  Intimate Partner Violence: Not At Risk (01/19/2023)   Humiliation, Afraid, Rape, and Kick questionnaire    Fear of Current or Ex-Partner: No    Emotionally Abused: No    Physically Abused: No    Sexually Abused: No   Family History  Adopted: Yes  Problem Relation Age of Onset   Colon cancer Neg Hx    Stomach cancer Neg Hx    Esophageal cancer Neg Hx    Colon polyps Neg Hx     OBJECTIVE:  Vitals:   02/13/23 1244  BP: (!) 143/83  Pulse: (!) 105  Resp: 18  Temp: 98.1 F (36.7 C)  TempSrc: Oral  SpO2: 95%     Physical Exam Vitals and nursing note reviewed.  Constitutional:      General: She is not in acute distress.    Appearance: Normal appearance. She is normal weight. She is not ill-appearing, toxic-appearing or diaphoretic.  HENT:     Head: Normocephalic.  Cardiovascular:     Rate and Rhythm: Normal rate and regular rhythm.     Pulses: Normal pulses.     Heart sounds: Normal heart sounds. No murmur heard.     No friction rub. No gallop.  Pulmonary:     Effort: Pulmonary effort is normal. No respiratory distress.     Breath sounds: Normal breath sounds. No stridor. No wheezing, rhonchi or rales.  Chest:     Chest wall: No tenderness.  Skin:    General: Skin is warm.     Comments: Wound present to right lower  leg surrounded by erythema.  No discharge on exam.  Skin is warm and painful to touch  Neurological:     Mental Status: She is alert and oriented to person, place, and time.      LABS:  No results found for this or any previous visit (from the past 24 hour(s)).   ASSESSMENT & PLAN:  1. Infected wound   2. Cellulitis of right lower extremity     Meds ordered this encounter  Medications   cephALEXin (KEFLEX) 500 MG capsule    Sig: Take 1 capsule (500 mg total) by mouth 4 (four) times daily.    Dispense:  20 capsule    Refill:  0   doxycycline (VIBRAMYCIN) 100 MG capsule    Sig: Take 1 capsule (100 mg total) by mouth 2 (two) times daily.    Dispense:  20 capsule    Refill:  0   Discharge instructions  Prescribed doxycycline and Keflex /take as directed and to completion Continue to alternate ibuprofen and tylenol as needed for pain and fever Follow up with PCP if symptoms persists Return or go to the ED if you have any new or worsening symptoms such as increased pain, redness, swelling, discharge, high fever, night sweats, abdominal pain, etc...   Reviewed expectations re: course of current medical issues. Questions answered. Outlined signs and symptoms indicating need for more acute intervention. Patient verbalized understanding. After Visit Summary given.          Emerson Monte, [place] 02/13/23 1321

## 2023-02-13 NOTE — Discharge Instructions (Addendum)
Prescribed doxycycline and Keflex /take as directed and to completion Continue to alternate ibuprofen and tylenol as needed for pain and fever Follow up with PCP if symptoms persists Return or go to the ED if you have any new or worsening symptoms such as increased pain, redness, swelling, discharge, high fever, night sweats, abdominal pain, etc..Marland Kitchen

## 2023-02-13 NOTE — ED Triage Notes (Signed)
Pt fell 6 days ago and has wound to right lower leg that is now red and painful with some discharge; pt is diabetic

## 2023-02-17 ENCOUNTER — Other Ambulatory Visit: Payer: Commercial Managed Care - HMO

## 2023-02-17 NOTE — Patient Instructions (Signed)
Visit Information  Danielle Hurley was given information about Medicaid Managed Care team care coordination services as a part of their [place] Medicaid benefit. Danielle Hurley verbally consented to engagement with the [facility] Managed Care team.   If you are experiencing a medical emergency, please call 911 or report to your local emergency department or urgent care.   If you have a non-emergency medical problem during routine business hours, please contact your provider's office and ask to speak with a nurse.   For questions related to your [facility], please call: [phone] or visit the homepage here: [email or web address]  If you would like to schedule transportation through your [facility], please call the following number at least 2 days in advance of your appointment: [phone]   Rides for urgent appointments can also be made after hours by calling Member Services.  Call the [place] at [phone], at any time, 24 hours a day, 7 days a week. If you are in danger or need immediate medical attention call 911.  If you would like help to quit smoking, call 1-800-QUIT-NOW 801-239-8297) OR Espaol: 1-855-Djelo-Ya QO:409462) o para ms informacin haga clic aqu or Text READY to 200-400 to register via text  Ms. Litts - following are the goals we discussed in your visit today:   Goals Addressed   None      Social Worker will follow up on 03/18/23.   Danielle Hurley, BSW, [place] Managed Medicaid Team  [phone]   Following is a copy of your plan of care:  There are no care plans that you recently modified to display for this patient.

## 2023-02-17 NOTE — Patient Outreach (Signed)
Medicaid Managed Care Social Work Note  02/17/2023 Name:  Danielle Hurley MRN:  [id number] DOB:  [birth year]  Danielle Hurley is an 62 y.o. year old female who is a primary patient of Junius Creamer, Greer Ee, NP.  The [facility] Managed Care Coordination team was consulted for assistance with:   medications  Ms. Chandley was given information about Medicaid Managed Care Coordination team services today. Drucie Ip Patient agreed to services and verbal consent obtained.  Engaged with patient  for by telephone forfollow up visit in response to referral for case management and/or care coordination services.   Assessments/Interventions:  Review of past medical history, allergies, medications, health status, including review of consultants reports, laboratory and other test data, was performed as part of comprehensive evaluation and provision of chronic care management services.  SDOH: (Social Determinant of Health) assessments and interventions performed: BSW completed a telephone outreach with patient, she stated she is still unable to get her medications from the pharmacy. She states she is unsure if [employer] cancelled her plan because she has not checked her mail. Patient states the pharmacy told her that she had a 1 time override. BSW will reach out to pharmacists to see if she can assists. No other resources are needed at this time.  Advanced Directives Status:  Not addressed in this encounter.  Care Plan                 No Known Allergies  Medications Reviewed Today     Reviewed by Darius Bump, [facility] (Pharmacist) on 01/14/23 at [place] List Status: <None>   Medication Order Taking? Sig Documenting Provider Last Dose Status Informant  aspirin-acetaminophen-caffeine (EXCEDRIN MIGRAINE) 250-250-65 MG tablet [id number] Yes Take 1 tablet by mouth every 6 (six) hours as needed for headache. [provider] Taking Active Self  atorvastatin (LIPITOR) 20 MG tablet [id number] Yes Take 1 tablet (20 mg  total) by mouth daily. Ronnell Freshwater, NP Taking Active   Bismuth Subsalicylate (KAOPECTATE) 262 MG TABS [id number] Yes Take 262 mg by mouth daily as needed (upset stomach). [provider] Taking Active Self  blood glucose meter kit and supplies [id number] Yes Dispense based on patient and insurance preference. Use up to four times daily as directed. (FOR ICD-10 E10.9, E11.9). Ronnell Freshwater, NP Taking Active   calcium carbonate (TUMS - DOSED IN MG ELEMENTAL CALCIUM) 500 MG chewable tablet [id number] Yes Chew 1-2 tablets by mouth daily as needed for indigestion or heartburn. [provider] Taking Active Self  celecoxib (CELEBREX) 200 MG capsule [id number] Yes TAKE 1 CAPSULE BY MOUTH TWICE DAILY AS NEEDED Boscia, Heather E, NP Taking Active   cetirizine (ZYRTEC) 10 MG tablet [id number] Yes Take 10 mg by mouth daily as needed for allergies. [provider] Taking Active Self  Cholecalciferol (VITAMIN D) 50 MCG (2000 UT) tablet [id number] Yes Take 2,000 Units by mouth daily. [provider] Taking Active Self  diphenhydrAMINE (BENADRYL) 2 % cream [id number] Yes Apply 1 application topically 3 (three) times daily as needed for itching. [provider] Taking Active Self  Empagliflozin-metFORMIN HCl ER (SYNJARDY XR) 25-1000 MG TB24 [id number]  Take 1 tablet by mouth daily. Ronnell Freshwater, NP  Active   glimepiride (AMARYL) 4 MG tablet [id number] Yes Take 1 tablet (4 mg total) by mouth 2 (two) times daily. Ronnell Freshwater, NP Taking Active   glucose blood (TRUETRACK TEST) test strip [id number] Yes Bloodsugar testing QD and prn Ronnell Freshwater, NP Taking Active  guaifenesin (HUMIBID E) 400 MG TABS tablet [id number] No Take 400 mg by mouth daily as needed.  Patient not taking: Reported on 01/14/2023   [provider] Not Taking Active Self  mupirocin ointment (BACTROBAN) 2 % [id number] No Apply small amount to effected area twice daily for 2 weeks then as  needed  Patient not taking: Reported on 01/14/2023   Ronnell Freshwater, NP Not Taking Active Self  oxybutynin (DITROPAN-XL) 10 MG 24 hr tablet [id number] No TAKE 1 TABLET BY MOUTH AT BEDTIME  Patient not taking: Reported on 01/14/2023   Ronnell Freshwater, NP Not Taking Active   pantoprazole (PROTONIX) 40 MG tablet [id number] Yes Take 1 tablet (40 mg total) by mouth daily. Ronnell Freshwater, NP Taking Active   rOPINIRole (REQUIP) 0.5 MG tablet [id number] Yes Take 1-2 tablets (0.5-1 mg total) by mouth at bedtime. Ronnell Freshwater, NP Taking Active   saxagliptin HCl (ONGLYZA) 5 MG TABS tablet [id number] No Take 1 tablet (5 mg total) by mouth daily.  Patient not taking: Reported on 01/14/2023   Ronnell Freshwater, NP Not Taking Active   valsartan (DIOVAN) 160 MG tablet [id number] Yes Take 1 tablet (160 mg total) by mouth daily. Ronnell Freshwater, NP Taking Active             Patient Active Problem List   Diagnosis Date Noted   Chest pain 07/16/2022   Abnormal ECG 07/16/2022   Chronic left-sided low back pain with left-sided sciatica 07/16/2022   Restless leg syndrome 07/16/2022   Leg cramps 07/16/2022   Abnormal weight gain 07/16/2022   Type 2 diabetes mellitus with hyperglycemia, without long-term current use of insulin ([place]) 05/31/2022   Urinary tract infection with hematuria 05/31/2022   Gastroesophageal reflux disease without esophagitis 05/31/2022   Inflammatory polyarthritis ([place]) 05/31/2022   Cancer screening 05/19/2022   Overactive bladder 11/02/2021   Hepatic cirrhosis ([place]) 10/21/2021   Immunization counseling 10/21/2021   Pulmonary nodule 08/19/2021   Body mass index 27.0-27.9, adult 08/19/2021   Need for hepatitis B vaccination 07/22/2021   Cellulitis of ear, right 07/08/2021   TB (pulmonary tuberculosis) 06/13/2021   Medication monitoring encounter 06/13/2021   Upper airway cough syndrome 05/08/2021   TB of lung w/ cavitation 05/08/2021   Hypertension associated with  diabetes ([place]) 03/31/2021   Diabetes mellitus without complication ([place]) [id number]   Hyperlipidemia associated with type 2 diabetes mellitus ([place]) 03/31/2021   Encounter to establish care 03/31/2021   History of active tuberculosis 03/31/2021   Wheezing 03/31/2021   Chronic hepatitis C without hepatic coma ([place]) 03/31/2021   Chronic left shoulder pain 03/31/2021   Dysuria 03/31/2021   Vaginal yeast infection 03/31/2021    Conditions to be addressed/monitored per PCP order:   medications  There are no care plans that you recently modified to display for this patient.   Follow up:  Patient agrees to Care Plan and Follow-up.  Plan: The Managed Medicaid care management team will reach out to the patient again over the next 30 days.  Date/time of next scheduled Social Work care management/care coordination outreach:  03/18/23  Mickel Fuchs, Arita Miss, [place] Medicaid Team  [phone]

## 2023-02-18 ENCOUNTER — Other Ambulatory Visit: Payer: Self-pay | Admitting: Pharmacist

## 2023-02-18 MED ORDER — FREESTYLE LIBRE 2 READER DEVI: 1.0000 | Freq: Once | 1 refills | Status: AC

## 2023-02-18 MED ORDER — FREESTYLE LIBRE 2 SENSOR MISC: 1.0000 "application " | 11 refills | Status: DC

## 2023-02-18 NOTE — Progress Notes (Signed)
02/18/2023 Name: Danielle Hurley MRN: [id number] DOB: May 30, 1961   Danielle Hurley is a 62 y.o. year old female who presented for a telephone visit.   They were referred to the pharmacist by a quality report for assistance in managing diabetes.   Patient experiencing barriers to medication access due to insurance changes and difficulty processing claims. Previously had [employer], now fully converted to [facility]. Patient reports her [place] was unable to process claims for most recent refill needs.   Patient is needing:metformin, celebrex, saxagliptin, oxybutynin.[place], who was able to resubmit RX claims to insurance - successfully processed all for $4, consistent with expected medicaid coverage.  Patient is participating in a Managed Medicaid Plan:  Yes  Subjective:  Care Team: Primary Care Provider: Ronnell Freshwater, NP   Medication Access/Adherence  Current Pharmacy:  [facility] ([facility]), [place] [id number] [place]) [place] [address] Phone: [phone] Fax: [phone]  [place], [place] [address] Mail Order for [place] [address] Phone: [phone] Fax: [phone]   Patient reports affordability concerns with their medications: Yes  Patient reports access/transportation concerns to their pharmacy: Yes  Patient reports adherence concerns with their medications:  Yes     Diabetes:  Current medications: metformin 1000mg  BID, glimepiride 4mg  BID, saxagliptin 5mg  daily Medications tried in the past:   Current glucose readings: not currently checking, patient cannot find her supplies and is out of test strips.    Objective:  Lab Results  Component Value Date   HGBA1C 8.5 11/03/2022    Lab Results  Component Value Date   CREATININE 1.06 (H) 07/14/2022   BUN 18 07/14/2022   NA 140 07/14/2022   K  4.6 07/14/2022   CL 100 07/14/2022   CO2 23 07/14/2022    Lab Results  Component Value Date   CHOL 161 12/02/2021   HDL 49 12/02/2021   LDLCALC 83 12/02/2021   TRIG 171 (H) 12/02/2021   CHOLHDL 3.3 12/02/2021    Medications Reviewed Today     Reviewed by Darius Bump, [place] (Pharmacist) on 01/14/23 at [place] List Status: <None>   Medication Order Taking? Sig Documenting Provider Last Dose Status Informant  aspirin-acetaminophen-caffeine (EXCEDRIN MIGRAINE) 250-250-65 MG tablet [id number] Yes Take 1 tablet by mouth every 6 (six) hours as needed for headache. [provider] Taking Active Self  atorvastatin (LIPITOR) 20 MG tablet [id number] Yes Take 1 tablet (20 mg total) by mouth daily. Ronnell Freshwater, NP Taking Active   Bismuth Subsalicylate (KAOPECTATE) 262 MG TABS [id number] Yes Take 262 mg by mouth daily as needed (upset stomach). [provider] Taking Active Self  blood glucose meter kit and supplies [id number] Yes Dispense based on patient and insurance preference. Use up to four times daily as directed. (FOR ICD-10 E10.9, E11.9). Ronnell Freshwater, NP Taking Active   calcium carbonate (TUMS - DOSED IN MG ELEMENTAL CALCIUM) 500 MG chewable tablet [id number] Yes Chew 1-2 tablets by mouth daily as needed for indigestion or heartburn. [provider] Taking Active Self  celecoxib (CELEBREX) 200 MG capsule [id number] Yes TAKE 1 CAPSULE BY MOUTH TWICE DAILY AS NEEDED Boscia, Heather E, NP Taking Active   cetirizine (ZYRTEC) 10 MG tablet [id number] Yes Take 10 mg by mouth daily as needed for allergies. [provider] Taking Active Self  Cholecalciferol (VITAMIN D) 50 MCG (2000 UT)  tablet [id number] Yes Take 2,000 Units by mouth daily. [provider] Taking Active Self  diphenhydrAMINE (BENADRYL) 2 % cream [id number] Yes Apply 1 application topically 3 (three) times daily as needed for itching. [provider] Taking Active Self   Empagliflozin-metFORMIN HCl ER (SYNJARDY XR) 25-1000 MG TB24 [id number]  Take 1 tablet by mouth daily. Ronnell Freshwater, NP  Active   glimepiride (AMARYL) 4 MG tablet [id number] Yes Take 1 tablet (4 mg total) by mouth 2 (two) times daily. Ronnell Freshwater, NP Taking Active   glucose blood (TRUETRACK TEST) test strip [id number] Yes Bloodsugar testing QD and prn Ronnell Freshwater, NP Taking Active   guaifenesin (HUMIBID E) 400 MG TABS tablet [id number] No Take 400 mg by mouth daily as needed.  Patient not taking: Reported on 01/14/2023   [provider] Not Taking Active Self  mupirocin ointment (BACTROBAN) 2 % [id number] No Apply small amount to effected area twice daily for 2 weeks then as needed  Patient not taking: Reported on 01/14/2023   Ronnell Freshwater, NP Not Taking Active Self  oxybutynin (DITROPAN-XL) 10 MG 24 hr tablet [id number] No TAKE 1 TABLET BY MOUTH AT BEDTIME  Patient not taking: Reported on 01/14/2023   Ronnell Freshwater, NP Not Taking Active   pantoprazole (PROTONIX) 40 MG tablet [id number] Yes Take 1 tablet (40 mg total) by mouth daily. Ronnell Freshwater, NP Taking Active   rOPINIRole (REQUIP) 0.5 MG tablet [id number] Yes Take 1-2 tablets (0.5-1 mg total) by mouth at bedtime. Ronnell Freshwater, NP Taking Active   saxagliptin HCl (ONGLYZA) 5 MG TABS tablet [id number] No Take 1 tablet (5 mg total) by mouth daily.  Patient not taking: Reported on 01/14/2023   Ronnell Freshwater, NP Not Taking Active   valsartan (DIOVAN) 160 MG tablet [id number] Yes Take 1 tablet (160 mg total) by mouth daily. Ronnell Freshwater, NP Taking Active               Assessment/Plan:   Diabetes: - Currently uncontrolled - Recommend to continue current medications for now, contacted patient back with update that medications are available at pharmacy, med access issues resolved. - Will collaborate with PCP to send RX for [employer] sensor PLUS READER, as patient would like to initiate CGM.  - After  CGM implemented, will work with patient ongoing to optimize diabetes medications with new medicaid insurance coverage to better control glucose with evidence based therapy.  Follow Up Plan: 2-3 weeks  Larinda Buttery, PharmD Clinical Pharmacist [facility] [phone]

## 2023-03-04 ENCOUNTER — Telehealth: Payer: Self-pay | Admitting: *Deleted

## 2023-03-04 ENCOUNTER — Encounter: Payer: Self-pay | Admitting: Nurse Practitioner

## 2023-03-04 ENCOUNTER — Ambulatory Visit (INDEPENDENT_AMBULATORY_CARE_PROVIDER_SITE_OTHER): Payer: Medicaid Other | Admitting: Nurse Practitioner

## 2023-03-04 VITALS — BP 135/78 | HR 93 | Ht 71.0 in | Wt 206.1 lb

## 2023-03-04 DIAGNOSIS — Z7984 Long term (current) use of oral hypoglycemic drugs: Secondary | ICD-10-CM | POA: Diagnosis not present

## 2023-03-04 DIAGNOSIS — I152 Hypertension secondary to endocrine disorders: Secondary | ICD-10-CM | POA: Diagnosis not present

## 2023-03-04 DIAGNOSIS — E785 Hyperlipidemia, unspecified: Secondary | ICD-10-CM | POA: Diagnosis not present

## 2023-03-04 DIAGNOSIS — E1169 Type 2 diabetes mellitus with other specified complication: Secondary | ICD-10-CM | POA: Diagnosis not present

## 2023-03-04 DIAGNOSIS — Z0001 Encounter for general adult medical examination with abnormal findings: Secondary | ICD-10-CM | POA: Diagnosis not present

## 2023-03-04 DIAGNOSIS — K219 Gastro-esophageal reflux disease without esophagitis: Secondary | ICD-10-CM

## 2023-03-04 DIAGNOSIS — E1159 Type 2 diabetes mellitus with other circulatory complications: Secondary | ICD-10-CM

## 2023-03-04 DIAGNOSIS — E1165 Type 2 diabetes mellitus with hyperglycemia: Secondary | ICD-10-CM | POA: Diagnosis not present

## 2023-03-04 LAB — POCT GLYCOSYLATED HEMOGLOBIN (HGB A1C): HbA1c POC (<> result, manual entry): 11.5 % (ref 4.0–5.6)

## 2023-03-04 MED ORDER — METFORMIN HCL ER (OSM) 1000 MG PO TB24: ORAL_TABLET | ORAL | 3 refills | Status: DC

## 2023-03-04 NOTE — Progress Notes (Signed)
Complete physical exam   Patient: Danielle Hurley   DOB: 1961-04-03   62 y.o. Female  MRN: [id number] Visit Date: 03/04/2023    Chief Complaint  Patient presents with   Annual Exam   Subjective    Danielle Hurley is a 62 y.o. female who presents today for a complete physical exam.  She reports consuming a diabetic, 1500 calorie diet.  States that she walks a little bit  She generally feels fairly well. She does have additional problems to discuss today.    HPI  Annual physical  Very high blood sugars . -HgbA1c 11.5 today., up significantly from 8.5 at her last check.  -has been taking all medications as prescribed. States that she has been making poor diet choices.  Blood pressure well controlled.  Due to have routine, fasting labs drawn.  - She denies chest pain, chest pressure, or shortness of breath. She denies headaches or visual disturbances. She denies abdominal pain, nausea, vomiting, or changes in bowel or bladder habits.    Past Medical History:  Diagnosis Date   Arthritis    COPD (chronic obstructive pulmonary disease) (HCC)    Diabetes mellitus without complication (HCC)    Dyspnea    GERD (gastroesophageal reflux disease)    Hepatitis C    treated 2022   High cholesterol    History of kidney stones    Hypertension    Tuberculosis    non contagious Dx 05/2020   Past Surgical History:  Procedure Laterality Date   CHOLECYSTECTOMY  1998   COLONOSCOPY  03/22/2023   first colonoscopy   TUBAL LIGATION  01/1995   VIDEO BRONCHOSCOPY WITH ENDOBRONCHIAL NAVIGATION N/A 09/04/2021   Procedure: VIDEO BRONCHOSCOPY WITH ENDOBRONCHIAL NAVIGATION;  Surgeon: Loreli Slot, MD;  Location: [facility] OR;  Service: Thoracic;  Laterality: N/A;   Social History   Socioeconomic History   Marital status: Widowed    Spouse name: Not on file   Number of children: 4   Years of education: Not on file   Highest education level: 12th grade  Occupational History   Not on file  Tobacco  Use   Smoking status: Former    Packs/day: 1.00    Years: 40.00    Additional pack years: 0.00    Total pack years: 40.00    Types: Cigarettes    Quit date: 2014    Years since quitting: 10.3   Smokeless tobacco: Never  Vaping Use   Vaping Use: Never used  Substance and Sexual Activity   Alcohol use: Not Currently    Comment: none since hep c dx   Drug use: Not Currently   Sexual activity: Not Currently  Other Topics Concern   Not on file  Social History Narrative   Not on file   Social Determinants of Health   Financial Resource Strain: Not on file  Food Insecurity: No Food Insecurity (01/19/2023)   Hunger Vital Sign    Worried About Running Out of Food in the Last Year: Never true    Ran Out of Food in the Last Year: Never true  Transportation Needs: No Transportation Needs (01/19/2023)   PRAPARE - Administrator, Civil Service (Medical): No    Lack of Transportation (Non-Medical): No  Physical Activity: Not on file  Stress: Not on file  Social Connections: Not on file  Intimate Partner Violence: Not At Risk (01/19/2023)   Humiliation, Afraid, Rape, and Kick questionnaire    Fear of Current or Ex-Partner: No  Emotionally Abused: No    Physically Abused: No    Sexually Abused: No   Family Status  Relation Name Status   Brother Ona (Not Specified)   Neg Hx  (Not Specified)   Family History  Adopted: Yes  Problem Relation Age of Onset   Lung cancer Brother    Colon cancer Neg Hx    Stomach cancer Neg Hx    Esophageal cancer Neg Hx    Colon polyps Neg Hx    No Known Allergies  Patient Care Team: Carlean Jews, NP as PCP - General (Family Medicine)   Medications: Outpatient Medications Prior to Visit  Medication Sig   aspirin-acetaminophen-caffeine (EXCEDRIN MIGRAINE) 250-250-65 MG tablet Take 1 tablet by mouth every 6 (six) hours as needed for headache.   atorvastatin (LIPITOR) 20 MG tablet Take 1 tablet (20 mg total) by mouth daily.    Bismuth Subsalicylate (KAOPECTATE) 262 MG TABS Take 262 mg by mouth daily as needed (upset stomach).   blood glucose meter kit and supplies Dispense based on patient and insurance preference. Use up to four times daily as directed. (FOR ICD-10 E10.9, E11.9). (Patient not taking: Reported on 03/22/2023)   calcium carbonate (TUMS - DOSED IN MG ELEMENTAL CALCIUM) 500 MG chewable tablet Chew 1-2 tablets by mouth daily as needed for indigestion or heartburn. (Patient not taking: Reported on 03/22/2023)   cetirizine (ZYRTEC) 10 MG tablet Take 10 mg by mouth daily as needed for allergies.   Cholecalciferol (VITAMIN D) 50 MCG (2000 UT) tablet Take 2,000 Units by mouth daily.   glimepiride (AMARYL) 4 MG tablet Take 1 tablet (4 mg total) by mouth 2 (two) times daily.   glucose blood (TRUETRACK TEST) test strip Bloodsugar testing QD and prn (Patient not taking: Reported on 03/22/2023)   guaifenesin (HUMIBID E) 400 MG TABS tablet Take 400 mg by mouth daily as needed.   mupirocin ointment (BACTROBAN) 2 % Apply small amount to effected area twice daily for 2 weeks then as needed   pantoprazole (PROTONIX) 40 MG tablet Take 1 tablet (40 mg total) by mouth daily.   rOPINIRole (REQUIP) 0.5 MG tablet Take 1-2 tablets (0.5-1 mg total) by mouth at bedtime.   valsartan (DIOVAN) 160 MG tablet Take 1 tablet (160 mg total) by mouth daily.   [DISCONTINUED] celecoxib (CELEBREX) 200 MG capsule TAKE 1 CAPSULE BY MOUTH TWICE DAILY AS NEEDED   [DISCONTINUED] Continuous Blood Gluc Sensor (FREESTYLE LIBRE 2 SENSOR) MISC 1 application  by Does not apply route every 14 (fourteen) days.   [DISCONTINUED] oxybutynin (DITROPAN-XL) 10 MG 24 hr tablet TAKE 1 TABLET BY MOUTH AT BEDTIME   [DISCONTINUED] saxagliptin HCl (ONGLYZA) 5 MG TABS tablet TAKE 1 TABLET BY MOUTH ONCE DAILY *STOP  JANUVIA*   [DISCONTINUED] cephALEXin (KEFLEX) 500 MG capsule Take 1 capsule (500 mg total) by mouth 4 (four) times daily.   [DISCONTINUED] diphenhydrAMINE  (BENADRYL) 2 % cream Apply 1 application topically 3 (three) times daily as needed for itching.   [DISCONTINUED] doxycycline (VIBRAMYCIN) 100 MG capsule Take 1 capsule (100 mg total) by mouth 2 (two) times daily.   [DISCONTINUED] Empagliflozin-metFORMIN HCl ER (SYNJARDY XR) 25-1000 MG TB24 Take 1 tablet by mouth daily.   No facility-administered medications prior to visit.    Review of Systems See HPI    Last CBC Lab Results  Component Value Date   WBC 8.6 03/15/2023   HGB 12.1 03/15/2023   HCT 36.6 03/15/2023   MCV 83.6 03/15/2023   MCH 27.0  07/14/2022   RDW 15.0 03/15/2023   PLT 224.0 03/15/2023   Last metabolic panel Lab Results  Component Value Date   GLUCOSE 346 (H) 03/15/2023   NA 134 (L) 03/15/2023   K 4.9 03/15/2023   CL 100 03/15/2023   CO2 22 03/15/2023   BUN 25 (H) 03/15/2023   CREATININE 1.26 (H) 03/15/2023   EGFR 59 (L) 07/14/2022   CALCIUM 9.4 03/15/2023   PROT 8.0 03/15/2023   ALBUMIN 4.1 03/15/2023   LABGLOB 3.9 07/14/2022   AGRATIO 1.1 (L) 07/14/2022   BILITOT 0.4 03/15/2023   ALKPHOS 103 03/15/2023   AST 16 03/15/2023   ALT 12 03/15/2023   ANIONGAP 10 08/18/2021   Last lipids Lab Results  Component Value Date   CHOL 161 12/02/2021   HDL 49 12/02/2021   LDLCALC 83 12/02/2021   TRIG 171 (H) 12/02/2021   CHOLHDL 3.3 12/02/2021   Last hemoglobin A1c Lab Results  Component Value Date   HGBA1C 11.5 03/04/2023   Last thyroid functions Lab Results  Component Value Date   TSH 2.020 07/14/2022        Objective     Today's Vitals   03/04/23 1426  BP: 135/78  Pulse: 93  SpO2: 96%  Weight: 206 lb 1.9 oz (93.5 kg)  Height: 5\' 11"  (1.803 m)   Body mass index is 28.75 kg/m.  BP Readings from Last 3 Encounters:  03/22/23 128/65  03/18/23 127/73  03/15/23 (Abnormal) 144/78    Wt Readings from Last 3 Encounters:  03/22/23 207 lb (93.9 kg)  03/18/23 207 lb (93.9 kg)  03/15/23 207 lb 9.6 oz (94.2 kg)     Physical Exam Vitals and  nursing note reviewed.  Constitutional:      Appearance: Normal appearance. She is well-developed.  HENT:     Head: Normocephalic and atraumatic.     Right Ear: Tympanic membrane, ear canal and external ear normal.     Left Ear: Tympanic membrane, ear canal and external ear normal.     Nose: Nose normal.     Mouth/Throat:     Mouth: Mucous membranes are moist.     Pharynx: Oropharynx is clear.  Eyes:     Extraocular Movements: Extraocular movements intact.     Conjunctiva/sclera: Conjunctivae normal.     Pupils: Pupils are equal, round, and reactive to light.  Cardiovascular:     Rate and Rhythm: Normal rate and regular rhythm.     Pulses: Normal pulses.     Heart sounds: Normal heart sounds.  Pulmonary:     Effort: Pulmonary effort is normal.     Breath sounds: Normal breath sounds.  Abdominal:     General: Bowel sounds are normal. There is no distension.     Palpations: Abdomen is soft. There is no mass.     Tenderness: There is abdominal tenderness. There is no right CVA tenderness, left CVA tenderness, guarding or rebound.     Hernia: No hernia is present.  Musculoskeletal:        General: Normal range of motion.     Cervical back: Normal range of motion and neck supple.  Lymphadenopathy:     Cervical: No cervical adenopathy.  Skin:    General: Skin is warm and dry.     Capillary Refill: Capillary refill takes less than 2 seconds.  Neurological:     General: No focal deficit present.     Mental Status: She is alert and oriented to person, place, and time.  Psychiatric:        Mood and Affect: Mood normal.        Behavior: Behavior normal.        Thought Content: Thought content normal.        Judgment: Judgment normal.     Last depression screening scores   Row Labels 03/04/2023    2:33 PM 05/27/2022    2:43 PM 05/19/2022    8:38 AM  PHQ 2/9 Scores   Section Header. No data exists in this row.     PHQ - 2 Score   2 2 2   PHQ- 9 Score   5 7 3    Last fall risk  screening   Row Labels 03/04/2023    2:33 PM  Fall Risk    Section Header. No data exists in this row.   Falls in the past year?   1  Number falls in past yr:   1  Injury with Fall?   1  Follow up   Falls evaluation completed     Assessment & Plan     Encounter for general adult medical examination with abnormal findings  Type 2 diabetes mellitus with hyperglycemia, without long-term current use of insulin (HCC) Assessment & Plan: HgbA1c 11.5 today.  Add Metformine XR 1000 mg twice daily.  Continue other diabetic medication as prescribed.   Limit carbohydrates and sugar in the diet  and increase daily water intake.   -recheck HgbA1c in 3 months.  -refer to podiatry for diabetic foot care.   Orders: -     Ambulatory referral to Podiatry -     POCT glycosylated hemoglobin (Hb A1C) -     metFORMIN HCl ER (OSM); Take 1 tablet po BID  Dispense: 60 tablet; Refill: 3  Hypertension associated with diabetes (HCC) Assessment & Plan: Blood pressure stable today. Continue current medication.   Orders: -     POCT glycosylated hemoglobin (Hb A1C)  Hyperlipidemia associated with type 2 diabetes mellitus (HCC) Assessment & Plan: Check fasting lipid panel and adjust statin dosing as indicated   Orders: -     POCT glycosylated hemoglobin (Hb A1C)  Gastroesophageal reflux disease without esophagitis Assessment & Plan: Continue regular visits with GI provider as scheduled.       Immunization History  Administered Date(s) Administered   Hepb-cpg 05/13/2021, 07/22/2021   Influenza,inj,Quad PF,6+ Mos 09/10/2021, 11/03/2022   Pneumococcal Polysaccharide-23 09/19/2021   Zoster Recombinat (Shingrix) 11/26/2021, 02/24/2022    Health Maintenance  Topic Date Due   COVID-19 Vaccine (1) Never done   FOOT EXAM  Never done   OPHTHALMOLOGY EXAM  Never done   MAMMOGRAM  Never done   Lung Cancer Screening  11/04/2023 (Originally 08/05/2022)   INFLUENZA VACCINE  07/01/2023   HEMOGLOBIN A1C   09/03/2023   Diabetic kidney evaluation - Urine ACR  11/04/2023   Diabetic kidney evaluation - eGFR measurement  03/14/2024   PAP SMEAR-Modifier  11/26/2024   COLONOSCOPY (Pts 45-78yrs Insurance coverage will need to be confirmed)  03/21/2033   Hepatitis C Screening  Completed   HIV Screening  Completed   Zoster Vaccines- Shingrix  Completed   HPV VACCINES  Aged Out   DTaP/Tdap/Td  Discontinued    Discussed health benefits of physical activity, and encouraged her to engage in regular exercise appropriate for her age and condition.    Return in about 3 months (around 06/03/2023) for diabetes with HgbA1c check, FBW a week prior to visit.  Ronnell Freshwater, NP  [facility] at [facility] [phone] (phone) [phone] (fax)  [place]

## 2023-03-04 NOTE — Telephone Encounter (Signed)
She is no longer taking [place]

## 2023-03-04 NOTE — Telephone Encounter (Signed)
Pt dropping of advanced directive, set to go out to batch scanning and a copy made for pt.

## 2023-03-09 ENCOUNTER — Telehealth: Payer: Self-pay

## 2023-03-09 ENCOUNTER — Other Ambulatory Visit: Payer: Medicaid Other | Admitting: Pharmacist

## 2023-03-09 NOTE — Telephone Encounter (Signed)
Pharmacy is calling about Rx metformin (FORTAMET) 1000 MG (OSM) 24 hr tablet NOT COVERED BY INSURANCE requiring a PA 1000$/month.  They state try Glucophage XR 500 MG taking 2 BID insurance may cover it.  Pt just picked up 1000MG  BID which is another reason that its being rejecting.

## 2023-03-09 NOTE — Progress Notes (Signed)
03/09/2023 Name: Lafrance Schwickerath MRN: [id number] DOB: January 14, 1961   Danielle Hurley is a 62 y.o. year old female who presented for a telephone visit.   They were referred to the pharmacist by a quality report for assistance in managing diabetes.   Patient was experiencing barriers to medication access due to insurance changes and difficulty processing claims. Previously had cigna, now fully converted to [facility]. Patient reports her [facility] pharmacy was able to fill medications at $4 copay, issue is now resolved.  Patient is participating in a Managed Medicaid Plan:  Yes  Subjective:  Care Team: Primary Care Provider: Carlean Jews, NP   Medication Access/Adherence  Current Pharmacy:  [facility] [address]), [place] - [address] [place] ([place]) [place] [address] Phone: [phone] Fax: [phone]  [facility] [place], [place] - [address] Mail Order for Specialty Drugs [place] [address] Phone: [phone] Fax: [phone]   Patient reports affordability concerns with their medications: Yes  Patient reports access/transportation concerns to their pharmacy: Yes  Patient reports adherence concerns with their medications:  Yes     Diabetes:  Current medications: metformin 1000mg  BID, glimepiride 4mg  BID, saxagliptin 5mg  daily.   Current glucose readings: not currently checking, patient cannot find her supplies.   Objective:  Lab Results  Component Value Date   HGBA1C 11.5 03/04/2023    Lab Results  Component Value Date   CREATININE 1.06 (H) 07/14/2022   BUN 18 07/14/2022   NA 140 07/14/2022   K 4.6 07/14/2022   CL 100 07/14/2022   CO2 23 07/14/2022    Lab Results  Component Value Date   CHOL 161 12/02/2021   HDL 49 12/02/2021   LDLCALC 83 12/02/2021   TRIG 171 (H) 12/02/2021   CHOLHDL 3.3 12/02/2021    Medications Reviewed Today     Reviewed by Gabriel Carina, RPH  (Pharmacist) on 03/09/23 at 1418  Med List Status: <None>   Medication Order Taking? Sig Documenting Provider Last Dose Status Informant  aspirin-acetaminophen-caffeine (EXCEDRIN MIGRAINE) 250-250-65 MG tablet [id number] Yes Take 1 tablet by mouth every 6 (six) hours as needed for headache. [provider] Taking Active Self  atorvastatin (LIPITOR) 20 MG tablet [id number]  Take 1 tablet (20 mg total) by mouth daily. Carlean Jews, NP  Active   Bismuth Subsalicylate (KAOPECTATE) 262 MG TABS [id number]  Take 262 mg by mouth daily as needed (upset stomach). [provider]  Active Self  blood glucose meter kit and supplies [id number]  Dispense based on patient and insurance preference. Use up to four times daily as directed. (FOR ICD-10 E10.9, E11.9). Carlean Jews, NP  Active   calcium carbonate (TUMS - DOSED IN MG ELEMENTAL CALCIUM) 500 MG chewable tablet [id number]  Chew 1-2 tablets by mouth daily as needed for indigestion or heartburn. [provider]  Active Self  celecoxib (CELEBREX) 200 MG capsule [id number]  TAKE 1 CAPSULE BY MOUTH TWICE DAILY AS NEEDED Boscia, Heather E, NP  Active   cetirizine (ZYRTEC) 10 MG tablet [id number]  Take 10 mg by mouth daily as needed for allergies. [provider]  Active Self  Cholecalciferol (VITAMIN D) 50 MCG (2000 UT) tablet [id number]  Take 2,000 Units by mouth daily. [provider]  Active Self  glimepiride (AMARYL) 4 MG tablet [id number] Yes Take 1 tablet (4 mg total) by mouth 2 (two) times daily. Carlean Jews, NP Taking Active  glucose blood (TRUETRACK TEST) test strip [id number]  Bloodsugar testing QD and prn Carlean Jews, NP  Active   guaifenesin (HUMIBID E) 400 MG TABS tablet [id number]  Take 400 mg by mouth daily as needed. [provider]  Active Self  metformin (FORTAMET) 1000 MG (OSM) 24 hr tablet [id number] Yes Take 1 tablet po BID Carlean Jews, NP Taking Active   mupirocin ointment  (BACTROBAN) 2 % [id number]  Apply small amount to effected area twice daily for 2 weeks then as needed Carlean Jews, NP  Active Self  oxybutynin (DITROPAN-XL) 10 MG 24 hr tablet [id number]  TAKE 1 TABLET BY MOUTH AT BEDTIME Boscia, Heather E, NP  Active   pantoprazole (PROTONIX) 40 MG tablet [id number]  Take 1 tablet (40 mg total) by mouth daily. Carlean Jews, NP  Active   rOPINIRole (REQUIP) 0.5 MG tablet [id number]  Take 1-2 tablets (0.5-1 mg total) by mouth at bedtime. Carlean Jews, NP  Active   saxagliptin HCl (ONGLYZA) 5 MG TABS tablet [id number] Yes TAKE 1 TABLET BY MOUTH ONCE DAILY *STOP  JANUVIA* Boscia, Heather E, NP Taking Active   valsartan (DIOVAN) 160 MG tablet [id number] Yes Take 1 tablet (160 mg total) by mouth daily. Carlean Jews, NP Taking Active               Assessment/Plan:   Diabetes: - Currently uncontrolled, A1c uptrend likely due to lapse in medications in between insurance - Recommend to continue current medications for now - Investigated coverage for CGM: requires prior auth and for patient to be on at least once daily insulin. Will collaborate with PCP for next steps - either once daily basal insulin + CGM, or trial period of restarting oral medications.  - If insulin and CGM implemented, will work with patient ongoing to optimize diabetes medications with new medicaid insurance coverage to better control glucose with evidence based therapy.  Follow Up Plan: 1 month  Lynnda Shields, PharmD Clinical Pharmacist [facility]

## 2023-03-10 NOTE — Telephone Encounter (Signed)
Called [facility] spoke to pharmacist they said to sent it in as Metformin ER 500 BID

## 2023-03-11 ENCOUNTER — Other Ambulatory Visit: Payer: Self-pay | Admitting: Nurse Practitioner

## 2023-03-11 DIAGNOSIS — M064 Inflammatory polyarthropathy: Secondary | ICD-10-CM

## 2023-03-11 DIAGNOSIS — G8929 Other chronic pain: Secondary | ICD-10-CM

## 2023-03-15 ENCOUNTER — Encounter: Payer: Self-pay | Admitting: Internal Medicine

## 2023-03-15 ENCOUNTER — Ambulatory Visit (INDEPENDENT_AMBULATORY_CARE_PROVIDER_SITE_OTHER): Payer: Medicaid Other | Admitting: Internal Medicine

## 2023-03-15 ENCOUNTER — Other Ambulatory Visit (INDEPENDENT_AMBULATORY_CARE_PROVIDER_SITE_OTHER): Payer: Medicaid Other

## 2023-03-15 VITALS — BP 144/78 | HR 80 | Ht 71.0 in | Wt 207.6 lb

## 2023-03-15 DIAGNOSIS — R1011 Right upper quadrant pain: Secondary | ICD-10-CM | POA: Diagnosis not present

## 2023-03-15 DIAGNOSIS — K7469 Other cirrhosis of liver: Secondary | ICD-10-CM

## 2023-03-15 DIAGNOSIS — R1310 Dysphagia, unspecified: Secondary | ICD-10-CM

## 2023-03-15 DIAGNOSIS — B1920 Unspecified viral hepatitis C without hepatic coma: Secondary | ICD-10-CM

## 2023-03-15 DIAGNOSIS — Z1211 Encounter for screening for malignant neoplasm of colon: Secondary | ICD-10-CM

## 2023-03-15 LAB — COMPREHENSIVE METABOLIC PANEL
ALT: 12 U/L (ref 0–35)
AST: 16 U/L (ref 0–37)
Albumin: 4.1 g/dL (ref 3.5–5.2)
Alkaline Phosphatase: 103 U/L (ref 39–117)
BUN: 25 mg/dL — ABNORMAL HIGH (ref 6–23)
CO2: 22 mEq/L (ref 19–32)
Calcium: 9.4 mg/dL (ref 8.4–10.5)
Chloride: 100 mEq/L (ref 96–112)
Creatinine, Ser: 1.26 mg/dL — ABNORMAL HIGH (ref 0.40–1.20)
GFR: 45.25 mL/min — ABNORMAL LOW (ref >60.00)
Glucose, Bld: 346 mg/dL — ABNORMAL HIGH (ref 70–99)
Potassium: 4.9 mEq/L (ref 3.5–5.1)
Sodium: 134 mEq/L — ABNORMAL LOW (ref 135–145)
Total Bilirubin: 0.4 mg/dL (ref 0.2–1.2)
Total Protein: 8 g/dL (ref 6.0–8.3)

## 2023-03-15 LAB — PROTIME-INR
INR: 1.1 ratio — ABNORMAL HIGH (ref 0.8–1.0)
Prothrombin Time: 11.5 s (ref 9.6–13.1)

## 2023-03-15 LAB — LIPASE: Lipase: 19 U/L (ref 11.0–59.0)

## 2023-03-15 LAB — CBC WITH DIFFERENTIAL/PLATELET
Basophils Absolute: 0.1 10*3/uL (ref 0.0–0.1)
Basophils Relative: 1.2 % (ref 0.0–3.0)
Eosinophils Absolute: 0.3 10*3/uL (ref 0.0–0.7)
Eosinophils Relative: 3.2 % (ref 0.0–5.0)
HCT: 36.6 % (ref 36.0–46.0)
Hemoglobin: 12.1 g/dL (ref 12.0–15.0)
Lymphocytes Relative: 22.2 % (ref 12.0–46.0)
Lymphs Abs: 1.9 10*3/uL (ref 0.7–4.0)
MCHC: 33.1 g/dL (ref 30.0–36.0)
MCV: 83.6 fl (ref 78.0–100.0)
Monocytes Absolute: 0.4 10*3/uL (ref 0.1–1.0)
Monocytes Relative: 4.6 % (ref 3.0–12.0)
Neutro Abs: 5.9 10*3/uL (ref 1.4–7.7)
Neutrophils Relative %: 68.8 % (ref 43.0–77.0)
Platelets: 224 10*3/uL (ref 150.0–400.0)
RBC: 4.38 Mil/uL (ref 3.87–5.11)
RDW: 15 % (ref 11.5–15.5)
WBC: 8.6 10*3/uL (ref 4.0–10.5)

## 2023-03-15 NOTE — Progress Notes (Unsigned)
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 The pain worsens when she is bending down and feels like a twisting sensation. Everything that she eats has caused worsening of the pain. This has been going on for the last month. She takes pantoprazole 40 mg QD, which seems to keep her reflux under good control. Denies dysphagia. Endorses some odynophagia. She doesn't think that GERD is contributing to her odynophagia. She fell a while back on her leg and had to get antibiotics for a leg wound about a month ago. Denies alcohol use. Last beer was 2 months ago. She usually has 1 BM once every 1-2 days. She is still taking her Benefiber. She does have irritable bowel on occasion. Denies N&V. Denies swelling, confusion, and blood in stools.  Wt Readings from Last 3 Encounters:  03/15/23 207 lb 9.6 oz (94.2 kg)  03/04/23 206 lb 1.9 oz (93.5 kg)  11/03/22 205 lb 1.9 oz (93 kg)   Current Outpatient Medications  Medication Sig Dispense Refill   aspirin-acetaminophen-caffeine (EXCEDRIN MIGRAINE) 250-250-65 MG tablet Take 1 tablet by mouth every 6 (six) hours as needed for headache.     atorvastatin (LIPITOR) 20 MG tablet Take 1 tablet (20 mg total) by mouth daily. 90 tablet 1   Bismuth Subsalicylate (KAOPECTATE) 262 MG TABS Take 262 mg by mouth daily as needed (upset stomach).     blood glucose meter kit and supplies Dispense based on patient and insurance preference. Use up to four times daily as directed. (FOR ICD-10 E10.9, E11.9). 1 each 0   calcium carbonate (TUMS - DOSED IN MG ELEMENTAL CALCIUM) 500 MG chewable tablet Chew 1-2 tablets by mouth daily as needed for indigestion or heartburn.     celecoxib (CELEBREX) 200 MG capsule TAKE 1 CAPSULE BY MOUTH TWICE DAILY  AS NEEDED 45 capsule 0   cetirizine (ZYRTEC) 10 MG tablet Take 10 mg by mouth daily as needed for allergies.     Cholecalciferol (VITAMIN D) 50 MCG (2000 UT) tablet Take 2,000 Units by mouth daily.     glimepiride (AMARYL) 4 MG tablet Take 1 tablet (4 mg total) by mouth 2 (two) times daily. 180 tablet 1   glucose blood (TRUETRACK TEST) test strip Bloodsugar testing QD and prn 100 each 12   guaifenesin (HUMIBID E) 400 MG TABS tablet Take 400 mg by mouth daily as needed.     metformin (FORTAMET) 1000 MG (OSM) 24 hr tablet Take 1 tablet po BID 60 tablet 3   mupirocin ointment (BACTROBAN) 2 % Apply small amount to effected area twice daily for 2 weeks then as needed 22 g 1   oxybutynin (DITROPAN-XL) 10 MG 24 hr tablet TAKE 1 TABLET BY MOUTH AT BEDTIME 30 tablet 0   pantoprazole (PROTONIX) 40 MG tablet Take 1 tablet (40 mg total) by mouth daily. 90 tablet 1   rOPINIRole (REQUIP) 0.5 MG tablet Take 1-2 tablets (0.5-1 mg total) by mouth at bedtime. 45 tablet 2   saxagliptin HCl (ONGLYZA) 5 MG TABS tablet TAKE 1 TABLET BY MOUTH ONCE DAILY *STOP  JANUVIA* 30 tablet 0   valsartan (DIOVAN) 160 MG tablet Take 1 tablet (160 mg total) by mouth daily. 90 tablet 1   No current facility-administered  medications for this visit.   Review of Systems: All systems reviewed and negative except where noted in HPI.   Physical Exam: BP (!) 144/78   Pulse 80   Ht  (1.803 m)   Wt 207 lb 9.6 oz (94.2 kg)   BMI 28.95 kg/m  Constitutional: Pleasant,well-developed, female in no acute distress. HEENT: Normocephalic and atraumatic. Conjunctivae are normal. No scleral icterus. Cardiovascular: Normal rate, regular rhythm.  Pulmonary/chest: Effort normal and breath sounds normal. No wheezing, rales or rhonchi. Abdominal: Soft, nondistended, mild diffuse tenderness to palpation. Bowel sounds active throughout. There are no masses palpable. No hepatomegaly. Extremities: No edema Neurological: Alert and oriented to  person place and time. Skin: Skin is warm and dry. No rashes noted. Psychiatric: Normal mood and affect. Behavior is normal.  Labs 03/2021: Hep B surface antigen NR, Hep A antibody positive  Labs 11/2021: CBC nml, CMP with elevated Cr of 1.23. LFTs with elevated alk phos of 133  Labs 06/2022: CBC nml. CMP with mildly elevated Cr of 1.06. LFTs with elevated alk phos of 152.   U/S with Elastography 11/17/21: IMPRESSION: ULTRASOUND ABDOMEN: Hepatic echotexture nodular hepatic contour suggestive of cirrhosis. No overt hepatic lesion visualized. ULTRASOUND HEPATIC ELASTOGRAPHY: Median kPa:  20 Diagnostic category: > or =17 kPa: highly suggestive of cACLD with an increased probability of clinically significant portal Hypertension  EGD 03/11/22: - Benign-appearing esophageal stenosis. Dilated. Biopsied. - Erythematous mucosa in the antrum. Biopsied. - Normal examined duodenum. - Biopsies were taken with a cold forceps for histology in the proximal esophagus and in the distal esophagus. Path: 1. Surgical [P], gastric antrum and gastric body - MILD REACTIVE GASTROPATHY, INVOLVING ANTRAL-TYPE MUCOSA. - GASTRIC OXYNTIC MUCOSA WITH MILD GLANDULAR DILATATION, SUGGESTIVE OF PROTON PUMP INHIBITOR EFFECT. - NEGATIVE FOR HELICOBACTER PYLORI ORGANISMS (H. PYLORI IMMUNOHISTOCHEMICAL (IHC) STAIN AND ADEQUATE CONTROLS EXAMINED). - NEGATIVE FOR INTESTINAL METAPLASIA AND MALIGNANCY. 2. Surgical [P], mid esophagus and distal esophagus - SQUAMOCOLUMNAR MUCOSA WITH REACTIVE CHANGES, CONSISTENT WITH REFLUX EFFECT (SQUAMOUS BASAL HYPERPLASIA AND ELONGATION OF PAPILLAE, FOCAL PARAKERATOSIS AND CHRONIC INFLAMMATION). - PAS STAIN NEGATIVE FOR FUNGI, WITH ADEQUATE CONTROL. - HELICOBACTER PYLORI IHC STAIN NEGATIVE FOR ORGANISMS, WITH ADEQUATE CONTROLS. - NO VIRAL CYTOPATHIC CHANGE, DYSPLASIA OR MALIGNANCY IDENTIFIED.  ASSESSMENT AND PLAN: HCV cirrhosis RUQ ab pain Odynophagia Colon cancer screening Fecal  incontinence Patient presents for follow up of HCV cirrhosis. She is still well compensated at this time. She does describe a new RUQ ab pain over the last several months. Not clear what is causing this pain currently but will plan for further work up with labs and CT. She also describes some odynophagia so will plan for an EGD for further evaluation. Patient did receive antibiotics for a leg wound recently so she may be at risk for candida esophagitis. - Continue daily fiber supplement - Check CBC, CMP, lipase, PT/INR, AFP - CT A/P w/contrast - Continue coffee consumption to prevent worsening of cirrhosis - EGD LEC - Will give Cologuard test for colon cancer screening - Patient will try lidocaine patch over the area of abdominal pain to see if it could be MSK related  Eulah Pont, MD  I spent 43 minutes of time, including in depth chart review, independent review of results as outlined above, communicating results with the patient directly, face-to-face time with the patient, coordinating care, ordering studies and medications as appropriate, and documentation.

## 2023-03-15 NOTE — Patient Instructions (Addendum)
You have been scheduled for an abdominal ultrasound at [facility] (1st floor of hospital) on 03/22/22 at 8:30 am. Please arrive 30 minutes prior to your appointment for registration. Make certain not to have anything to eat or drink after midnight prior to your appointment. Should you need to reschedule your appointment, please contact radiology at [phone]. This test typically takes about 30 minutes to perform.   Your provider has requested that you go to the basement level for lab work before leaving today. Press "B" on the elevator. The lab is located at the first door on the left as you exit the elevator.   You have been scheduled for an endoscopy. Please follow written instructions given to you at your visit today. If you use inhalers (even only as needed), please bring them with you on the day of your procedure.    If your blood pressure at your visit was 140/90 or greater, please contact your primary care physician to follow up on this.  _______________________________________________________  If you are age 64 or older, your body mass index should be between 23-30. Your Body mass index is 28.95 kg/m. If this is out of the aforementioned range listed, please consider follow up with your Primary Care Provider.  If you are age 53 or younger, your body mass index should be between 19-25. Your Body mass index is 28.95 kg/m. If this is out of the aformentioned range listed, please consider follow up with your Primary Care Provider.   ________________________________________________________  The [facility] providers would like to encourage you to use [employer] to communicate with providers for non-urgent requests or questions.  Due to long hold times on the telephone, sending your provider a message by [facility] may be a faster and more efficient way to get a response.  Please allow 48 business hours for a response.  Please remember that this is for non-urgent requests.   _______________________________________________________   Due to recent changes in healthcare laws, you may see the results of your imaging and laboratory studies on MyChart before your provider has had a chance to review them.  We understand that in some cases there may be results that are confusing or concerning to you. Not all laboratory results come back in the same time frame and the provider may be waiting for multiple results in order to interpret others.  Please give [place] 48 hours in order for your provider to thoroughly review all the results before contacting the office for clarification of your results.    Thank you for entrusting me with your care and for choosing [facility], Dr. Eulah Pont

## 2023-03-17 LAB — AFP TUMOR MARKER: AFP-Tumor Marker: 1.6 ng/mL

## 2023-03-18 ENCOUNTER — Ambulatory Visit (AMBULATORY_SURGERY_CENTER): Payer: Medicaid Other | Admitting: Internal Medicine

## 2023-03-18 ENCOUNTER — Encounter: Payer: Self-pay | Admitting: Internal Medicine

## 2023-03-18 ENCOUNTER — Other Ambulatory Visit: Payer: Medicaid Other

## 2023-03-18 VITALS — BP 127/73 | HR 73 | Temp 97.3°F | Resp 20 | Ht 71.0 in | Wt 207.0 lb

## 2023-03-18 DIAGNOSIS — B3781 Candidal esophagitis: Secondary | ICD-10-CM

## 2023-03-18 DIAGNOSIS — E7800 Pure hypercholesterolemia, unspecified: Secondary | ICD-10-CM | POA: Diagnosis not present

## 2023-03-18 DIAGNOSIS — J449 Chronic obstructive pulmonary disease, unspecified: Secondary | ICD-10-CM | POA: Diagnosis not present

## 2023-03-18 DIAGNOSIS — R1310 Dysphagia, unspecified: Secondary | ICD-10-CM

## 2023-03-18 DIAGNOSIS — K2289 Other specified disease of esophagus: Secondary | ICD-10-CM | POA: Diagnosis not present

## 2023-03-18 DIAGNOSIS — R1011 Right upper quadrant pain: Secondary | ICD-10-CM | POA: Diagnosis not present

## 2023-03-18 DIAGNOSIS — Z1211 Encounter for screening for malignant neoplasm of colon: Secondary | ICD-10-CM | POA: Diagnosis not present

## 2023-03-18 DIAGNOSIS — I10 Essential (primary) hypertension: Secondary | ICD-10-CM | POA: Diagnosis not present

## 2023-03-18 MED ORDER — FLUCONAZOLE 100 MG PO TABS: 100.0000 mg | ORAL_TABLET | Freq: Every day | ORAL | 0 refills | Status: DC

## 2023-03-18 MED ORDER — SODIUM CHLORIDE 0.9 % IV SOLN: 500.0000 mL | Freq: Once | INTRAVENOUS | Status: DC

## 2023-03-18 MED ORDER — NA SULFATE-K SULFATE-MG SULF 17.5-3.13-1.6 GM/177ML PO SOLN: 1.0000 | Freq: Once | ORAL | 0 refills | Status: AC

## 2023-03-18 NOTE — Progress Notes (Signed)
GASTROENTEROLOGY PROCEDURE H&P NOTE   Primary Care Physician: Carlean Jews, NP    Reason for Procedure:  Odynophagia, RUQ ab pain  Plan:    EGD  Patient is appropriate for endoscopic procedure(s) in the ambulatory ([facility]) setting.  The nature of the procedure, as well as the risks, benefits, and alternatives were carefully and thoroughly reviewed with the patient. Ample time for discussion and questions allowed. The patient understood, was satisfied, and agreed to proceed.     HPI: Danielle Hurley is a 62 y.o. female who presents for EGD for evaluation of odynophagia and RUQ ab pain.  Patient was most recently seen in the Gastroenterology Clinic on 03/15/23  No interval change in medical history since that appointment. Please refer to that note for full details regarding GI history and clinical presentation.   Past Medical History:  Diagnosis Date   Arthritis    COPD (chronic obstructive pulmonary disease)    Diabetes mellitus without complication    Dyspnea    GERD (gastroesophageal reflux disease)    Hepatitis C    treated 2022   High cholesterol    History of kidney stones    Hypertension    Tuberculosis    non contagious Dx 05/2020    Past Surgical History:  Procedure Laterality Date   CHOLECYSTECTOMY  1998   TUBAL LIGATION  01/1995   VIDEO BRONCHOSCOPY WITH ENDOBRONCHIAL NAVIGATION N/A 09/04/2021   Procedure: VIDEO BRONCHOSCOPY WITH ENDOBRONCHIAL NAVIGATION;  Surgeon: Loreli Slot, MD;  Location: [facility] OR;  Service: Thoracic;  Laterality: N/A;    Prior to Admission medications   Medication Sig Start Date End Date Taking? Authorizing Provider  aspirin-acetaminophen-caffeine (EXCEDRIN MIGRAINE) [phone] MG tablet Take 1 tablet by mouth every 6 (six) hours as needed for headache.   Yes [provider]  atorvastatin (LIPITOR) 20 MG tablet Take 1 tablet (20 mg total) by mouth daily. 05/27/22  Yes Boscia, Heather E, NP  celecoxib (CELEBREX) 200 MG  capsule TAKE 1 CAPSULE BY MOUTH TWICE DAILY AS NEEDED 03/11/23  Yes Boscia, Heather E, NP  cetirizine (ZYRTEC) 10 MG tablet Take 10 mg by mouth daily as needed for allergies.   Yes [provider]  Cholecalciferol (VITAMIN D) 50 MCG (2000 UT) tablet Take 2,000 Units by mouth daily.   Yes [provider]  glimepiride (AMARYL) 4 MG tablet Take 1 tablet (4 mg total) by mouth 2 (two) times daily. 01/05/23  Yes Carlean Jews, NP  metformin (FORTAMET) 1000 MG (OSM) 24 hr tablet Take 1 tablet po BID 03/04/23  Yes Boscia, Heather E, NP  oxybutynin (DITROPAN-XL) 10 MG 24 hr tablet TAKE 1 TABLET BY MOUTH AT BEDTIME 02/08/23  Yes Boscia, Heather E, NP  pantoprazole (PROTONIX) 40 MG tablet Take 1 tablet (40 mg total) by mouth daily. 01/05/23  Yes Boscia, Heather E, NP  saxagliptin HCl (ONGLYZA) 5 MG TABS tablet TAKE 1 TABLET BY MOUTH ONCE DAILY *STOP  JANUVIA* 02/08/23  Yes Boscia, Heather E, NP  valsartan (DIOVAN) 160 MG tablet Take 1 tablet (160 mg total) by mouth daily. 11/03/22  Yes Boscia, Kathlynn Grate, NP  Bismuth Subsalicylate (KAOPECTATE) 262 MG TABS Take 262 mg by mouth daily as needed (upset stomach).    [provider]  blood glucose meter kit and supplies Dispense based on patient and insurance preference. Use up to four times daily as directed. (FOR ICD-10 E10.9, E11.9). 07/09/22   Carlean Jews, NP  calcium carbonate (TUMS - DOSED IN  MG ELEMENTAL CALCIUM) 500 MG chewable tablet Chew 1-2 tablets by mouth daily as needed for indigestion or heartburn.    [provider]  glucose blood (TRUETRACK TEST) test strip Bloodsugar testing QD and prn 11/02/21   Carlean Jews, NP  guaifenesin (HUMIBID E) 400 MG TABS tablet Take 400 mg by mouth daily as needed.    [provider]  mupirocin ointment (BACTROBAN) 2 % Apply small amount to effected area twice daily for 2 weeks then as needed 07/08/21   Carlean Jews, NP  rOPINIRole (REQUIP) 0.5 MG tablet Take 1-2 tablets  (0.5-1 mg total) by mouth at bedtime. 07/09/22   Carlean Jews, NP    Current Outpatient Medications  Medication Sig Dispense Refill   aspirin-acetaminophen-caffeine (EXCEDRIN MIGRAINE) 250-250-65 MG tablet Take 1 tablet by mouth every 6 (six) hours as needed for headache.     atorvastatin (LIPITOR) 20 MG tablet Take 1 tablet (20 mg total) by mouth daily. 90 tablet 1   celecoxib (CELEBREX) 200 MG capsule TAKE 1 CAPSULE BY MOUTH TWICE DAILY AS NEEDED 45 capsule 0   cetirizine (ZYRTEC) 10 MG tablet Take 10 mg by mouth daily as needed for allergies.     Cholecalciferol (VITAMIN D) 50 MCG (2000 UT) tablet Take 2,000 Units by mouth daily.     glimepiride (AMARYL) 4 MG tablet Take 1 tablet (4 mg total) by mouth 2 (two) times daily. 180 tablet 1   metformin (FORTAMET) 1000 MG (OSM) 24 hr tablet Take 1 tablet po BID 60 tablet 3   oxybutynin (DITROPAN-XL) 10 MG 24 hr tablet TAKE 1 TABLET BY MOUTH AT BEDTIME 30 tablet 0   pantoprazole (PROTONIX) 40 MG tablet Take 1 tablet (40 mg total) by mouth daily. 90 tablet 1   saxagliptin HCl (ONGLYZA) 5 MG TABS tablet TAKE 1 TABLET BY MOUTH ONCE DAILY *STOP  JANUVIA* 30 tablet 0   valsartan (DIOVAN) 160 MG tablet Take 1 tablet (160 mg total) by mouth daily. 90 tablet 1   Bismuth Subsalicylate (KAOPECTATE) 262 MG TABS Take 262 mg by mouth daily as needed (upset stomach).     blood glucose meter kit and supplies Dispense based on patient and insurance preference. Use up to four times daily as directed. (FOR ICD-10 E10.9, E11.9). 1 each 0   calcium carbonate (TUMS - DOSED IN MG ELEMENTAL CALCIUM) 500 MG chewable tablet Chew 1-2 tablets by mouth daily as needed for indigestion or heartburn.     glucose blood (TRUETRACK TEST) test strip Bloodsugar testing QD and prn 100 each 12   guaifenesin (HUMIBID E) 400 MG TABS tablet Take 400 mg by mouth daily as needed.     mupirocin ointment (BACTROBAN) 2 % Apply small amount to effected area twice daily for 2 weeks then as  needed 22 g 1   rOPINIRole (REQUIP) 0.5 MG tablet Take 1-2 tablets (0.5-1 mg total) by mouth at bedtime. 45 tablet 2   Current Facility-Administered Medications  Medication Dose Route Frequency Provider Last Rate Last Admin   0.9 %  sodium chloride infusion  500 mL Intravenous Once Imogene Burn, MD        Allergies as of 03/18/2023   (No Known Allergies)    Family History  Adopted: Yes  Problem Relation Age of Onset   Colon cancer Neg Hx    Stomach cancer Neg Hx    Esophageal cancer Neg Hx    Colon polyps Neg Hx     Social History  Socioeconomic History   Marital status: Widowed    Spouse name: Not on file   Number of children: 4   Years of education: Not on file   Highest education level: 12th grade  Occupational History   Not on file  Tobacco Use   Smoking status: Former    Packs/day: 1.00    Years: 40.00    Additional pack years: 0.00    Total pack years: 40.00    Types: Cigarettes    Quit date: 2014    Years since quitting: 10.3   Smokeless tobacco: Never  Vaping Use   Vaping Use: Never used  Substance and Sexual Activity   Alcohol use: Not Currently    Comment: occasional   Drug use: Not Currently   Sexual activity: Not Currently  Other Topics Concern   Not on file  Social History Narrative   Not on file   Social Determinants of Health   Financial Resource Strain: Not on file  Food Insecurity: No Food Insecurity (01/19/2023)   Hunger Vital Sign    Worried About Running Out of Food in the Last Year: Never true    Ran Out of Food in the Last Year: Never true  Transportation Needs: No Transportation Needs (01/19/2023)   PRAPARE - Administrator, Civil Service (Medical): No    Lack of Transportation (Non-Medical): No  Physical Activity: Not on file  Stress: Not on file  Social Connections: Not on file  Intimate Partner Violence: Not At Risk (01/19/2023)   Humiliation, Afraid, Rape, and Kick questionnaire    Fear of Current or  Ex-Partner: No    Emotionally Abused: No    Physically Abused: No    Sexually Abused: No    Physical Exam: Vital signs in last 24 hours: BP 136/72   Pulse 78   Temp (!) 97.3 F (36.3 C)   Ht  (1.803 m)   Wt 207 lb (93.9 kg)   SpO2 95%   BMI 28.87 kg/m  GEN: NAD EYE: Sclerae anicteric ENT: MMM CV: Non-tachycardic Pulm: No increased WOB GI: Soft NEURO:  Alert & Oriented   Eulah Pont, MD  Gastroenterology   03/18/2023 1:19 PM

## 2023-03-18 NOTE — Addendum Note (Signed)
Addended by: Dennison Mascot on: 03/18/2023 02:11 PM   Modules accepted: Orders

## 2023-03-18 NOTE — Patient Instructions (Signed)
  Thank you for coming in today to see Danielle Hurley? Resume previous diet and medications today.  Follow pre-colonoscopy guidelines.  See you Monday @ 7:30 AM Follow up appointment with Dr [name] Friday July 26 @ 3:20.  Please arrive 10 minutes early.  Bowel prep and anti-fungal medications sent to your pharmacy.  Return to regular daily activities tomorrow  YOU HAD AN ENDOSCOPIC PROCEDURE TODAY AT THE [facility]:   Refer to the procedure report that was given to you for any specific questions about what was found during the examination.  If the procedure report does not answer your questions, please call your gastroenterologist to clarify.  If you requested that your care partner not be given the details of your procedure findings, then the procedure report has been included in a sealed envelope for you to review at your convenience later.  YOU SHOULD EXPECT: Some feelings of bloating in the abdomen. Passage of more gas than usual.  Walking can help get rid of the air that was put into your GI tract during the procedure and reduce the bloating. If you had a lower endoscopy (such as a colonoscopy or flexible sigmoidoscopy) you may notice spotting of blood in your stool or on the toilet paper. If you underwent a bowel prep for your procedure, you may not have a normal bowel movement for a few days.  Please Note:  You might notice some irritation and congestion in your nose or some drainage.  This is from the oxygen used during your procedure.  There is no need for concern and it should clear up in a day or so.  SYMPTOMS TO REPORT IMMEDIATELY:    Following upper endoscopy (EGD)  Vomiting of blood or coffee ground material  New chest pain or pain under the shoulder blades  Painful or persistently difficult swallowing  New shortness of breath  Fever of 100F or higher  Black, tarry-looking stools  For urgent or emergent issues, a gastroenterologist can be reached at any hour by calling  [phone]. Do not use MyChart messaging for urgent concerns.    DIET:  We do recommend a small meal at first, but then you may proceed to your regular diet.  Drink plenty of fluids but you should avoid alcoholic beverages for 24 hours.  ACTIVITY:  You should plan to take it easy for the rest of today and you should NOT DRIVE or use heavy machinery until tomorrow (because of the sedation medicines used during the test).    FOLLOW UP: Our staff will call the number listed on your records the next business day following your procedure.  We will call around 7:15- 8:00 am to check on you and address any questions or concerns that you may have regarding the information given to you following your procedure. If we do not reach you, we will leave a message.     If any biopsies were taken you will be contacted by phone or by letter within the next 1-3 weeks.  Please call Danielle Hurley at [phone] if you have not heard about the biopsies in 3 weeks.    SIGNATURES/CONFIDENTIALITY: You and/or your care partner have signed paperwork which will be entered into your electronic medical record.  These signatures attest to the fact that that the information above on your After Visit Summary has been reviewed and is understood.  Full responsibility of the confidentiality of this discharge information lies with you and/or your care-partner.

## 2023-03-18 NOTE — Patient Instructions (Signed)
Visit Information  Ms. Riederer was given information about Medicaid Managed Care team care coordination services as a part of their [facility] Community Plan Medicaid benefit. Jocelyne Reinertsen verbally consented  to engagement with the [facility] Managed Care team.   If you are experiencing a medical emergency, please call 911 or report to your local emergency department or urgent care.   If you have a non-emergency medical problem during routine business hours, please contact your provider's office and ask to speak with a nurse.   For questions related to your [facility], please call: [phone] or visit the homepage here: [email or web address]  If you would like to schedule transportation through your [facility], please call the following number at least 2 days in advance of your appointment: [phone]   Rides for urgent appointments can also be made after hours by calling Member Services.  Call the Behavioral Health Crisis Line at [phone], at any time, 24 hours a day, 7 days a week. If you are in danger or need immediate medical attention call 911.  If you would like help to quit smoking, call 1-800-QUIT-NOW (334-568-4922) OR Espaol: 1-855-Djelo-Ya (1-324-401-0272) o para ms informacin haga clic aqu or Text READY to 536-644 to register via text  Ms. Gwendolyn Fill - following are the goals we discussed in your visit today:   Goals Addressed   None      Social Worker will follow up in 30 days.   Gus Puma, Kenard Gower, MHA [facility]  Managed Medicaid Social Worker [phone]   Following is a copy of your plan of care:  There are no care plans that you recently modified to display for this patient.

## 2023-03-18 NOTE — Progress Notes (Signed)
Pt's states no medical or surgical changes since previsit or office visit. 

## 2023-03-18 NOTE — Patient Outreach (Signed)
Medicaid Managed Care Social Work Note  03/18/2023 Name:  Danielle Hurley MRN:  [id number] DOB:  [birth year]  Danielle Hurley is an 62 y.o. year old female who is a primary patient of Hermenia Fiscal, Kathlynn Grate, NP.  The [facility] Managed Care Coordination team was consulted for assistance with:   medication assistance  Danielle Hurley was given information about Medicaid Managed Care Coordination team services today. [name] Patient agreed to services and verbal consent obtained.  Engaged with patient  for by telephone forfollow up visit in response to referral for case management and/or care coordination services.   Assessments/Interventions:  Review of past medical history, allergies, medications, health status, including review of consultants reports, laboratory and other test data, was performed as part of comprehensive evaluation and provision of chronic care management services.  SDOH: (Social Determinant of Health) assessments and interventions performed: BSW completed a telephone outreach with patient, she stated she is now able to get her medication with her medicaid with a $4 copay. Patient states she is thinking about LCSW services and would contact BSW if she decides she wants to speak with her. No other resources are needed at this time.  Advanced Directives Status:  Not addressed in this encounter.  Care Plan                 No Known Allergies  Medications Reviewed Today     Reviewed by Richardson Chiquito, CMA (Certified Medical Assistant) on 03/15/23 at 1353  Med List Status: <None>   Medication Order Taking? Sig Documenting Provider Last Dose Status Informant  aspirin-acetaminophen-caffeine (EXCEDRIN MIGRAINE) 250-250-65 MG tablet [id number] Yes Take 1 tablet by mouth every 6 (six) hours as needed for headache. [provider] Taking Active Self  atorvastatin (LIPITOR) 20 MG tablet [id number] Yes Take 1 tablet (20 mg total) by mouth daily. Carlean Jews, NP Taking Active    Bismuth Subsalicylate (KAOPECTATE) 262 MG TABS [id number] Yes Take 262 mg by mouth daily as needed (upset stomach). [provider] Taking Active Self  blood glucose meter kit and supplies [id number] Yes Dispense based on patient and insurance preference. Use up to four times daily as directed. (FOR ICD-10 E10.9, E11.9). Carlean Jews, NP Taking Active   calcium carbonate (TUMS - DOSED IN MG ELEMENTAL CALCIUM) 500 MG chewable tablet [id number] Yes Chew 1-2 tablets by mouth daily as needed for indigestion or heartburn. [provider] Taking Active Self  celecoxib (CELEBREX) 200 MG capsule [id number] Yes TAKE 1 CAPSULE BY MOUTH TWICE DAILY AS NEEDED Boscia, Heather E, NP Taking Active   cetirizine (ZYRTEC) 10 MG tablet [id number] Yes Take 10 mg by mouth daily as needed for allergies. [provider] Taking Active Self  Cholecalciferol (VITAMIN D) 50 MCG (2000 UT) tablet [id number] Yes Take 2,000 Units by mouth daily. [provider] Taking Active Self  glimepiride (AMARYL) 4 MG tablet [id number] Yes Take 1 tablet (4 mg total) by mouth 2 (two) times daily. Carlean Jews, NP Taking Active   glucose blood (TRUETRACK TEST) test strip [id number] Yes Bloodsugar testing QD and prn Carlean Jews, NP Taking Active   guaifenesin (HUMIBID E) 400 MG TABS tablet [id number] Yes Take 400 mg by mouth daily as needed. [provider] Taking Active Self  metformin (FORTAMET) 1000 MG (OSM) 24 hr tablet [id number] Yes Take 1 tablet po BID Carlean Jews, NP Taking Active   mupirocin ointment (BACTROBAN) 2 % [id number] Yes Apply small amount to effected area twice daily for  2 weeks then as needed Carlean Jews, NP Taking Active Self  oxybutynin (DITROPAN-XL) 10 MG 24 hr tablet [id number] Yes TAKE 1 TABLET BY MOUTH AT BEDTIME Boscia, Heather E, NP Taking Active   pantoprazole (PROTONIX) 40 MG tablet [id number] Yes Take 1 tablet (40 mg total) by mouth daily. Carlean Jews, NP Taking Active   rOPINIRole (REQUIP) 0.5 MG tablet [id number] Yes Take 1-2 tablets (0.5-1 mg total) by mouth at bedtime. Carlean Jews, NP Taking Active   saxagliptin HCl (ONGLYZA) 5 MG TABS tablet [id number] Yes TAKE 1 TABLET BY MOUTH ONCE DAILY *STOP  JANUVIA* Boscia, Heather E, NP Taking Active   valsartan (DIOVAN) 160 MG tablet [id number] Yes Take 1 tablet (160 mg total) by mouth daily. Carlean Jews, NP Taking Active             Patient Active Problem List   Diagnosis Date Noted   Chest pain 07/16/2022   Abnormal ECG 07/16/2022   Chronic left-sided low back pain with left-sided sciatica 07/16/2022   Restless leg syndrome 07/16/2022   Leg cramps 07/16/2022   Abnormal weight gain 07/16/2022   Type 2 diabetes mellitus with hyperglycemia, without long-term current use of insulin 05/31/2022   Urinary tract infection with hematuria 05/31/2022   Gastroesophageal reflux disease without esophagitis 05/31/2022   Inflammatory polyarthritis 05/31/2022   Cancer screening 05/19/2022   Overactive bladder 11/02/2021   Hepatic cirrhosis 10/21/2021   Immunization counseling 10/21/2021   Pulmonary nodule 08/19/2021   Body mass index 27.0-27.9, adult 08/19/2021   Need for hepatitis B vaccination 07/22/2021   Cellulitis of ear, right 07/08/2021   TB (pulmonary tuberculosis) 06/13/2021   Medication monitoring encounter 06/13/2021   Upper airway cough syndrome 05/08/2021   TB of lung w/ cavitation 05/08/2021   Hypertension associated with diabetes 03/31/2021   Diabetes mellitus without complication 03/31/2021   Hyperlipidemia associated with type 2 diabetes mellitus 03/31/2021   Encounter to establish care 03/31/2021   History of active tuberculosis 03/31/2021   Wheezing 03/31/2021   Chronic hepatitis C without hepatic coma 03/31/2021   Chronic left shoulder pain 03/31/2021   Dysuria 03/31/2021   Vaginal yeast infection 03/31/2021    Conditions to be addressed/monitored per PCP  order:   medication assistance  There are no care plans that you recently modified to display for this patient.   Follow up:  Patient agrees to Care Plan and Follow-up.  Plan: The Managed Medicaid care management team will reach out to the patient again over the next 30 days.  Date/time of next scheduled Social Work care management/care coordination outreach:  04/19/23  Gus Puma, Kenard Gower, [facility]  Managed [facility] Social Worker [phone]

## 2023-03-18 NOTE — Op Note (Signed)
[facility] Patient Name: Danielle Hurley Procedure Date: 03/18/2023 1:20 PM MRN: [id number] Endoscopist: Particia Lather , , [id number] Age: 62 Referring MD:  Date of Birth: November 12, 1961 Gender: Female Account #: [id number] Procedure:                Upper GI endoscopy Indications:              Odynophagia Medicines:                Monitored Anesthesia Care Procedure:                Pre-Anesthesia Assessment:                           - Prior to the procedure, a History and Physical                            was performed, and patient medications and                            allergies were reviewed. The patient's tolerance of                            previous anesthesia was also reviewed. The risks                            and benefits of the procedure and the sedation                            options and risks were discussed with the patient.                            All questions were answered, and informed consent                            was obtained. Prior Anticoagulants: The patient has                            taken no anticoagulant or antiplatelet agents. ASA                            Grade Assessment: III - A patient with severe                            systemic disease. After reviewing the risks and                            benefits, the patient was deemed in satisfactory                            condition to undergo the procedure.                           After obtaining informed consent, the endoscope was  passed under direct vision. Throughout the                            procedure, the patient's blood pressure, pulse, and                            oxygen saturations were monitored continuously. The                            Olympus Scope [id number] was introduced through the                            mouth, and advanced to the second part of duodenum.                            The upper GI endoscopy was  accomplished without                            difficulty. The patient tolerated the procedure                            well. Scope In: Scope Out: Findings:                 Localized, white plaques were found in the proximal                            esophagus. Biopsies were taken with a cold forceps                            for histology.                           Food (residue) was found in the gastric body.                           The examined duodenum was normal. Complications:            No immediate complications. Estimated Blood Loss:     Estimated blood loss was minimal. Impression:               - Esophageal plaques were found, suspicious for                            candidiasis. Biopsied.                           - Food (residue) in the stomach.                           - Normal examined duodenum. Recommendation:           - Discharge patient to home (with escort).                           - For treatment of esopahgeal candidiasis, start  fluconazole 200 mg QD x 1 dose, followed by 100 mg                            QD for 13 days.                           - Await pathology results.                           - Return to GI clinic in 2 months.                           - The findings and recommendations were discussed                            with the patient. Dr Particia Lather "[name]" [name],  03/18/2023 1:43:41 PM

## 2023-03-18 NOTE — Progress Notes (Signed)
Called to room to assist during endoscopic procedure.  Patient ID and intended procedure confirmed with present staff. Received instructions for my participation in the procedure from the performing physician.  

## 2023-03-18 NOTE — Progress Notes (Signed)
Pt resting comfortably. VSS. Airway intact. SBAR complete to RN. All questions answered.   

## 2023-03-19 ENCOUNTER — Telehealth: Payer: Self-pay | Admitting: Internal Medicine

## 2023-03-19 ENCOUNTER — Telehealth: Payer: Self-pay | Admitting: *Deleted

## 2023-03-19 NOTE — Telephone Encounter (Signed)
Would you help with this please? 

## 2023-03-19 NOTE — Telephone Encounter (Signed)
Danielle Hurley, [facility] was not on the [employer] of places to have the CT scan done. I got the scan approved to:  [facility]  [id number] [place], [place] [address]  Can you please let the pt know? Thank you.

## 2023-03-19 NOTE — Telephone Encounter (Signed)
  Follow up Call-     03/18/2023    1:12 PM 03/11/2022   11:06 AM  Call back number  Post procedure Call Back phone  # [phone]  Permission to leave phone message Yes Yes     Patient questions:  Do you have a fever, pain , or abdominal swelling? No. Pain Score  0 *  Have you tolerated food without any problems? Yes.    Have you been able to return to your normal activities? Yes.    Do you have any questions about your discharge instructions: Diet   No. Medications  No. Follow up visit  No.  Do you have questions or concerns about your Care? No.  Actions: * If pain score is 4 or above: No action needed, pain <4.

## 2023-03-22 ENCOUNTER — Telehealth: Payer: Self-pay

## 2023-03-22 ENCOUNTER — Encounter: Payer: Self-pay | Admitting: Internal Medicine

## 2023-03-22 ENCOUNTER — Encounter: Payer: 59 | Admitting: Internal Medicine

## 2023-03-22 ENCOUNTER — Ambulatory Visit (AMBULATORY_SURGERY_CENTER): Payer: Medicaid Other | Admitting: Internal Medicine

## 2023-03-22 VITALS — BP 128/65 | HR 63 | Temp 98.2°F | Resp 19 | Ht 71.0 in | Wt 207.0 lb

## 2023-03-22 DIAGNOSIS — J449 Chronic obstructive pulmonary disease, unspecified: Secondary | ICD-10-CM | POA: Diagnosis not present

## 2023-03-22 DIAGNOSIS — Z1211 Encounter for screening for malignant neoplasm of colon: Secondary | ICD-10-CM | POA: Diagnosis not present

## 2023-03-22 DIAGNOSIS — E119 Type 2 diabetes mellitus without complications: Secondary | ICD-10-CM | POA: Diagnosis not present

## 2023-03-22 DIAGNOSIS — I10 Essential (primary) hypertension: Secondary | ICD-10-CM | POA: Diagnosis not present

## 2023-03-22 HISTORY — PX: COLONOSCOPY: SHX174

## 2023-03-22 MED ORDER — SODIUM CHLORIDE 0.9 % IV SOLN: 500.0000 mL | Freq: Once | INTRAVENOUS | Status: DC

## 2023-03-22 NOTE — Telephone Encounter (Signed)
Spoke to patient advise appointment for Ct abdomen pelvis was reschedule for [facility] due to insurance coverage  04/22/23 at 12:20 pm   patient need to pick up contrast address [address] [place], [place] [address] Phone [phone]

## 2023-03-22 NOTE — Progress Notes (Signed)
Updated medical record.

## 2023-03-22 NOTE — Progress Notes (Signed)
Pt resting comfortably. VSS. Airway intact. SBAR complete to RN. All questions answered.   

## 2023-03-22 NOTE — Progress Notes (Signed)
GASTROENTEROLOGY PROCEDURE H&P NOTE   Primary Care Physician: Carlean Jews, NP    Reason for Procedure:   Colon cancer screening  Plan:    Colonoscopy  Patient is appropriate for endoscopic procedure(s) in the ambulatory ([facility]) setting.  The nature of the procedure, as well as the risks, benefits, and alternatives were carefully and thoroughly reviewed with the patient. Ample time for discussion and questions allowed. The patient understood, was satisfied, and agreed to proceed.     HPI: Danielle Hurley is a 62 y.o. female who presents for colonoscopy for colon cancer screening. She is not sure about her family history because she is adopted.  Past Medical History:  Diagnosis Date   Arthritis    COPD (chronic obstructive pulmonary disease)    Diabetes mellitus without complication    Dyspnea    GERD (gastroesophageal reflux disease)    Hepatitis C    treated 2022   High cholesterol    History of kidney stones    Hypertension    Tuberculosis    non contagious Dx 05/2020    Past Surgical History:  Procedure Laterality Date   CHOLECYSTECTOMY  1998   COLONOSCOPY  03/22/2023   first colonoscopy   TUBAL LIGATION  01/1995   VIDEO BRONCHOSCOPY WITH ENDOBRONCHIAL NAVIGATION N/A 09/04/2021   Procedure: VIDEO BRONCHOSCOPY WITH ENDOBRONCHIAL NAVIGATION;  Surgeon: Loreli Slot, MD;  Location: [facility] OR;  Service: Thoracic;  Laterality: N/A;    Prior to Admission medications   Medication Sig Start Date End Date Taking? Authorizing Provider  acetaminophen (TYLENOL) 325 MG tablet Take 650 mg by mouth every 6 (six) hours as needed.   Yes [provider]  aspirin-acetaminophen-caffeine (EXCEDRIN MIGRAINE) [phone] MG tablet Take 1 tablet by mouth every 6 (six) hours as needed for headache.   Yes [provider]  atorvastatin (LIPITOR) 20 MG tablet Take 1 tablet (20 mg total) by mouth daily. 05/27/22  Yes Boscia, Heather E, NP  celecoxib (CELEBREX) 200 MG  capsule TAKE 1 CAPSULE BY MOUTH TWICE DAILY AS NEEDED 03/11/23  Yes Boscia, Heather E, NP  cetirizine (ZYRTEC) 10 MG tablet Take 10 mg by mouth daily as needed for allergies.   Yes [provider]  Cholecalciferol (VITAMIN D) 50 MCG (2000 UT) tablet Take 2,000 Units by mouth daily.   Yes [provider]  fluconazole (DIFLUCAN) 100 MG tablet Take 1 tablet (100 mg total) by mouth daily. 03/18/23  Yes Imogene Burn, MD  glimepiride (AMARYL) 4 MG tablet Take 1 tablet (4 mg total) by mouth 2 (two) times daily. 01/05/23  Yes Carlean Jews, NP  metformin (FORTAMET) 1000 MG (OSM) 24 hr tablet Take 1 tablet po BID 03/04/23  Yes Boscia, Heather E, NP  oxybutynin (DITROPAN-XL) 10 MG 24 hr tablet TAKE 1 TABLET BY MOUTH AT BEDTIME 02/08/23  Yes Boscia, Heather E, NP  pantoprazole (PROTONIX) 40 MG tablet Take 1 tablet (40 mg total) by mouth daily. 01/05/23  Yes Boscia, Heather E, NP  saxagliptin HCl (ONGLYZA) 5 MG TABS tablet TAKE 1 TABLET BY MOUTH ONCE DAILY *STOP  JANUVIA* 02/08/23  Yes Boscia, Heather E, NP  valsartan (DIOVAN) 160 MG tablet Take 1 tablet (160 mg total) by mouth daily. 11/03/22  Yes Boscia, Kathlynn Grate, NP  Bismuth Subsalicylate (KAOPECTATE) 262 MG TABS Take 262 mg by mouth daily as needed (upset stomach).    [provider]  blood glucose meter kit and supplies Dispense based on patient and insurance preference. Use  up to four times daily as directed. (FOR ICD-10 E10.9, E11.9). Patient not taking: Reported on 03/22/2023 07/09/22   Carlean Jews, NP  calcium carbonate (TUMS - DOSED IN MG ELEMENTAL CALCIUM) 500 MG chewable tablet Chew 1-2 tablets by mouth daily as needed for indigestion or heartburn. Patient not taking: Reported on 03/22/2023    [provider]  glucose blood (TRUETRACK TEST) test strip Bloodsugar testing QD and prn Patient not taking: Reported on 03/22/2023 11/02/21   Carlean Jews, NP  guaifenesin (HUMIBID E) 400 MG TABS tablet Take 400 mg by  mouth daily as needed.    [provider]  mupirocin ointment (BACTROBAN) 2 % Apply small amount to effected area twice daily for 2 weeks then as needed 07/08/21   Carlean Jews, NP  rOPINIRole (REQUIP) 0.5 MG tablet Take 1-2 tablets (0.5-1 mg total) by mouth at bedtime. 07/09/22   Carlean Jews, NP    Current Outpatient Medications  Medication Sig Dispense Refill   acetaminophen (TYLENOL) 325 MG tablet Take 650 mg by mouth every 6 (six) hours as needed.     aspirin-acetaminophen-caffeine (EXCEDRIN MIGRAINE) 250-250-65 MG tablet Take 1 tablet by mouth every 6 (six) hours as needed for headache.     atorvastatin (LIPITOR) 20 MG tablet Take 1 tablet (20 mg total) by mouth daily. 90 tablet 1   celecoxib (CELEBREX) 200 MG capsule TAKE 1 CAPSULE BY MOUTH TWICE DAILY AS NEEDED 45 capsule 0   cetirizine (ZYRTEC) 10 MG tablet Take 10 mg by mouth daily as needed for allergies.     Cholecalciferol (VITAMIN D) 50 MCG (2000 UT) tablet Take 2,000 Units by mouth daily.     fluconazole (DIFLUCAN) 100 MG tablet Take 1 tablet (100 mg total) by mouth daily. 15 tablet 0   glimepiride (AMARYL) 4 MG tablet Take 1 tablet (4 mg total) by mouth 2 (two) times daily. 180 tablet 1   metformin (FORTAMET) 1000 MG (OSM) 24 hr tablet Take 1 tablet po BID 60 tablet 3   oxybutynin (DITROPAN-XL) 10 MG 24 hr tablet TAKE 1 TABLET BY MOUTH AT BEDTIME 30 tablet 0   pantoprazole (PROTONIX) 40 MG tablet Take 1 tablet (40 mg total) by mouth daily. 90 tablet 1   saxagliptin HCl (ONGLYZA) 5 MG TABS tablet TAKE 1 TABLET BY MOUTH ONCE DAILY *STOP  JANUVIA* 30 tablet 0   valsartan (DIOVAN) 160 MG tablet Take 1 tablet (160 mg total) by mouth daily. 90 tablet 1   Bismuth Subsalicylate (KAOPECTATE) 262 MG TABS Take 262 mg by mouth daily as needed (upset stomach).     blood glucose meter kit and supplies Dispense based on patient and insurance preference. Use up to four times daily as directed. (FOR ICD-10 E10.9, E11.9). (Patient  not taking: Reported on 03/22/2023) 1 each 0   calcium carbonate (TUMS - DOSED IN MG ELEMENTAL CALCIUM) 500 MG chewable tablet Chew 1-2 tablets by mouth daily as needed for indigestion or heartburn. (Patient not taking: Reported on 03/22/2023)     glucose blood (TRUETRACK TEST) test strip Bloodsugar testing QD and prn (Patient not taking: Reported on 03/22/2023) 100 each 12   guaifenesin (HUMIBID E) 400 MG TABS tablet Take 400 mg by mouth daily as needed.     mupirocin ointment (BACTROBAN) 2 % Apply small amount to effected area twice daily for 2 weeks then as needed 22 g 1   rOPINIRole (REQUIP) 0.5 MG tablet Take 1-2 tablets (0.5-1 mg total) by mouth at  bedtime. 45 tablet 2   Current Facility-Administered Medications  Medication Dose Route Frequency Provider Last Rate Last Admin   0.9 %  sodium chloride infusion  500 mL Intravenous Once Imogene Burn, MD        Allergies as of 03/22/2023   (No Known Allergies)    Family History  Adopted: Yes  Problem Relation Age of Onset   Lung cancer Brother    Colon cancer Neg Hx    Stomach cancer Neg Hx    Esophageal cancer Neg Hx    Colon polyps Neg Hx     Social History   Socioeconomic History   Marital status: Widowed    Spouse name: Not on file   Number of children: 4   Years of education: Not on file   Highest education level: 12th grade  Occupational History   Not on file  Tobacco Use   Smoking status: Former    Packs/day: 1.00    Years: 40.00    Additional pack years: 0.00    Total pack years: 40.00    Types: Cigarettes    Quit date: 2014    Years since quitting: 10.3   Smokeless tobacco: Never  Vaping Use   Vaping Use: Never used  Substance and Sexual Activity   Alcohol use: Not Currently    Comment: none since hep c dx   Drug use: Not Currently   Sexual activity: Not Currently  Other Topics Concern   Not on file  Social History Narrative   Not on file   Social Determinants of Health   Financial Resource Strain:  Not on file  Food Insecurity: No Food Insecurity (01/19/2023)   Hunger Vital Sign    Worried About Running Out of Food in the Last Year: Never true    Ran Out of Food in the Last Year: Never true  Transportation Needs: No Transportation Needs (01/19/2023)   PRAPARE - Administrator, Civil Service (Medical): No    Lack of Transportation (Non-Medical): No  Physical Activity: Not on file  Stress: Not on file  Social Connections: Not on file  Intimate Partner Violence: Not At Risk (01/19/2023)   Humiliation, Afraid, Rape, and Kick questionnaire    Fear of Current or Ex-Partner: No    Emotionally Abused: No    Physically Abused: No    Sexually Abused: No    Physical Exam: Vital signs in last 24 hours: BP (!) 140/58   Pulse 80   Temp 98.2 F (36.8 C) (Skin)   Ht 5\' 11"  (1.803 m)   Wt 207 lb (93.9 kg)   SpO2 97%   BMI 28.87 kg/m  GEN: NAD EYE: Sclerae anicteric ENT: MMM CV: Non-tachycardic Pulm: No increased work of breathing GI: Soft, NT/ND NEURO:  Alert & Oriented   Eulah Pont, MD [facility]  03/22/2023 8:46 AM

## 2023-03-22 NOTE — Op Note (Signed)
[facility] Patient Name: Danielle Hurley Procedure Date: 03/22/2023 8:58 AM MRN: [id number] Endoscopist: Particia Lather , , [id number] Age: 62 Referring MD:  Date of Birth: 28-Oct-1961 Gender: Female Account #: [id number] Procedure:                Colonoscopy Indications:              Screening for colorectal malignant neoplasm Medicines:                Monitored Anesthesia Care Procedure:                Pre-Anesthesia Assessment:                           - Prior to the procedure, a History and Physical                            was performed, and patient medications and                            allergies were reviewed. The patient's tolerance of                            previous anesthesia was also reviewed. The risks                            and benefits of the procedure and the sedation                            options and risks were discussed with the patient.                            All questions were answered, and informed consent                            was obtained. Prior Anticoagulants: The patient has                            taken no anticoagulant or antiplatelet agents. ASA                            Grade Assessment: III - A patient with severe                            systemic disease. After reviewing the risks and                            benefits, the patient was deemed in satisfactory                            condition to undergo the procedure.                           After obtaining informed consent, the colonoscope  was passed under direct vision. Throughout the                            procedure, the patient's blood pressure, pulse, and                            oxygen saturations were monitored continuously. The                            CF [id number] #[id number] was introduced through the anus                            and advanced to the the terminal ileum. The                            colonoscopy  was performed without difficulty. The                            patient tolerated the procedure well. The quality                            of the bowel preparation was excellent. The                            terminal ileum, ileocecal valve, appendiceal                            orifice, and rectum were photographed. Scope In: 9:04:43 AM Scope Out: 9:28:34 AM Scope Withdrawal Time: 0 hours 10 minutes 40 seconds  Total Procedure Duration: 0 hours 23 minutes 51 seconds  Findings:                 The terminal ileum appeared normal.                           Non-bleeding internal hemorrhoids were found during                            retroflexion.                           The exam was otherwise without abnormality. Complications:            No immediate complications. Estimated Blood Loss:     Estimated blood loss: none. Impression:               - The examined portion of the ileum was normal.                           - Non-bleeding internal hemorrhoids.                           - The examination was otherwise normal.                           - No specimens collected. Recommendation:           -  Discharge patient to home (with escort).                           - Repeat colonoscopy in 10 years for surveillance.                           - The findings and recommendations were discussed                            with the patient. Dr Particia Lather "[name]" [name],  03/22/2023 9:32:30 AM

## 2023-03-22 NOTE — Patient Instructions (Signed)
Discharge instructions given. Handout on Hemorrhoids. Resume previous medications. YOU HAD AN ENDOSCOPIC PROCEDURE TODAY AT THE [facility]:   Refer to the procedure report that was given to you for any specific questions about what was found during the examination.  If the procedure report does not answer your questions, please call your gastroenterologist to clarify.  If you requested that your care partner not be given the details of your procedure findings, then the procedure report has been included in a sealed envelope for you to review at your convenience later.  YOU SHOULD EXPECT: Some feelings of bloating in the abdomen. Passage of more gas than usual.  Walking can help get rid of the air that was put into your GI tract during the procedure and reduce the bloating. If you had a lower endoscopy (such as a colonoscopy or flexible sigmoidoscopy) you may notice spotting of blood in your stool or on the toilet paper. If you underwent a bowel prep for your procedure, you may not have a normal bowel movement for a few days.  Please Note:  You might notice some irritation and congestion in your nose or some drainage.  This is from the oxygen used during your procedure.  There is no need for concern and it should clear up in a day or so.  SYMPTOMS TO REPORT IMMEDIATELY:  Following lower endoscopy (colonoscopy or flexible sigmoidoscopy):  Excessive amounts of blood in the stool  Significant tenderness or worsening of abdominal pains  Swelling of the abdomen that is new, acute  Fever of 100F or higher   For urgent or emergent issues, a gastroenterologist can be reached at any hour by calling [phone]. Do not use MyChart messaging for urgent concerns.    DIET:  We do recommend a small meal at first, but then you may proceed to your regular diet.  Drink plenty of fluids but you should avoid alcoholic beverages for 24 hours.  ACTIVITY:  You should plan to take it easy for the  rest of today and you should NOT DRIVE or use heavy machinery until tomorrow (because of the sedation medicines used during the test).    FOLLOW UP: Our staff will call the number listed on your records the next business day following your procedure.  We will call around 7:15- 8:00 am to check on you and address any questions or concerns that you may have regarding the information given to you following your procedure. If we do not reach you, we will leave a message.     If any biopsies were taken you will be contacted by phone or by letter within the next 1-3 weeks.  Please call us at [phone] if you have not heard about the biopsies in 3 weeks.    SIGNATURES/CONFIDENTIALITY: You and/or your care partner have signed paperwork which will be entered into your electronic medical record.  These signatures attest to the fact that that the information above on your After Visit Summary has been reviewed and is understood.  Full responsibility of the confidentiality of this discharge information lies with you and/or your care-partner. 

## 2023-03-23 ENCOUNTER — Other Ambulatory Visit: Payer: Self-pay

## 2023-03-23 ENCOUNTER — Telehealth: Payer: Self-pay

## 2023-03-23 ENCOUNTER — Ambulatory Visit (HOSPITAL_BASED_OUTPATIENT_CLINIC_OR_DEPARTMENT_OTHER): Payer: Medicaid Other

## 2023-03-23 ENCOUNTER — Other Ambulatory Visit (HOSPITAL_BASED_OUTPATIENT_CLINIC_OR_DEPARTMENT_OTHER): Payer: Medicaid Other

## 2023-03-23 ENCOUNTER — Encounter: Payer: Self-pay | Admitting: Internal Medicine

## 2023-03-23 ENCOUNTER — Ambulatory Visit (HOSPITAL_COMMUNITY): Payer: 59

## 2023-03-23 DIAGNOSIS — N3281 Overactive bladder: Secondary | ICD-10-CM

## 2023-03-23 DIAGNOSIS — E1165 Type 2 diabetes mellitus with hyperglycemia: Secondary | ICD-10-CM

## 2023-03-23 MED ORDER — OXYBUTYNIN CHLORIDE ER 10 MG PO TB24
10.0000 mg | ORAL_TABLET | Freq: Every day | ORAL | 0 refills | Status: DC
Start: 2023-03-23 — End: 2023-04-20

## 2023-03-23 MED ORDER — SAXAGLIPTIN HCL 5 MG PO TABS
ORAL_TABLET | ORAL | 0 refills | Status: DC
Start: 2023-03-23 — End: 2023-04-20

## 2023-03-23 NOTE — Telephone Encounter (Signed)
  Follow up Call-     03/22/2023    8:24 AM 03/18/2023    1:12 PM 03/11/2022   11:06 AM  Call back number  Post procedure Call Back phone  # [phone]  Permission to leave phone message No Yes Yes     Patient questions:  Do you have a fever, pain , or abdominal swelling? No. Pain Score  0 *  Have you tolerated food without any problems? Yes.    Have you been able to return to your normal activities? Yes.    Do you have any questions about your discharge instructions: Diet   No. Medications  No. Follow up visit  No.  Do you have questions or concerns about your Care? No.  Actions: * If pain score is 4 or above: No action needed, pain <4.

## 2023-03-23 NOTE — Telephone Encounter (Signed)
Rx has sent

## 2023-03-23 NOTE — Telephone Encounter (Signed)
Pt is requesting refills on: saxagliptin HCl (ONGLYZA) 5 MG TABS tablet  oxybutynin (DITROPAN-XL) 10 MG 24 hr tablet   Pharmacy: [id number] - [place] ([place]), [place] - [address]   LOV 12/05/08 ROV 08/06/03

## 2023-04-01 ENCOUNTER — Other Ambulatory Visit: Payer: Self-pay | Admitting: Nurse Practitioner

## 2023-04-01 DIAGNOSIS — G8929 Other chronic pain: Secondary | ICD-10-CM

## 2023-04-01 DIAGNOSIS — M064 Inflammatory polyarthropathy: Secondary | ICD-10-CM

## 2023-04-10 ENCOUNTER — Other Ambulatory Visit: Payer: Self-pay | Admitting: Nurse Practitioner

## 2023-04-10 DIAGNOSIS — E1159 Type 2 diabetes mellitus with other circulatory complications: Secondary | ICD-10-CM

## 2023-04-10 NOTE — Assessment & Plan Note (Signed)
Blood pressure stable today. Continue current medication.

## 2023-04-10 NOTE — Assessment & Plan Note (Signed)
HgbA1c 11.5 today.  Add Metformine XR 1000 mg twice daily.  Continue other diabetic medication as prescribed.   Limit carbohydrates and sugar in the diet  and increase daily water intake.   -recheck HgbA1c in 3 months.  -refer to podiatry for diabetic foot care.

## 2023-04-10 NOTE — Assessment & Plan Note (Signed)
Continue regular visits with GI provider as scheduled.

## 2023-04-10 NOTE — Assessment & Plan Note (Signed)
Check fasting lipid panel and adjust statin dosing as indicated

## 2023-04-14 ENCOUNTER — Ambulatory Visit (INDEPENDENT_AMBULATORY_CARE_PROVIDER_SITE_OTHER): Payer: Medicaid Other | Admitting: Podiatry

## 2023-04-14 ENCOUNTER — Encounter: Payer: Self-pay | Admitting: Podiatry

## 2023-04-14 DIAGNOSIS — E1165 Type 2 diabetes mellitus with hyperglycemia: Secondary | ICD-10-CM | POA: Diagnosis not present

## 2023-04-14 DIAGNOSIS — M79675 Pain in left toe(s): Secondary | ICD-10-CM | POA: Diagnosis not present

## 2023-04-14 DIAGNOSIS — L84 Corns and callosities: Secondary | ICD-10-CM

## 2023-04-14 DIAGNOSIS — M79674 Pain in right toe(s): Secondary | ICD-10-CM | POA: Diagnosis not present

## 2023-04-14 DIAGNOSIS — B351 Tinea unguium: Secondary | ICD-10-CM | POA: Diagnosis not present

## 2023-04-14 NOTE — Progress Notes (Signed)
  Subjective:  Patient ID: Danielle Hurley, female    DOB: June 30, 1961,   MRN: [id number]  Chief Complaint  Patient presents with   diabetic foot care     Callouses on the bottom of bilateral feet     62 y.o. female presents for concern of thickened elongated and painful nails that are difficult to trim. Requesting to have them trimmed today. Relates burning and tingling in their feet. Patient is diabetic and last A1c was  Lab Results  Component Value Date   HGBA1C 11.5 03/04/2023   .   PCP:  Carlean Jews, NP    . Denies any other pedal complaints. Denies n/v/f/c.   Past Medical History:  Diagnosis Date   Arthritis    COPD (chronic obstructive pulmonary disease) (HCC)    Diabetes mellitus without complication (HCC)    Dyspnea    GERD (gastroesophageal reflux disease)    Hepatitis C    treated 2022   High cholesterol    History of kidney stones    Hypertension    Tuberculosis    non contagious Dx 05/2020    Objective:  Physical Exam: Vascular: DP/PT pulses 2/4 bilateral. CFT <3 seconds. Absent hair growth on digits. Edema noted to bilateral lower extremities. Xerosis noted bilaterally.  Skin. No lacerations or abrasions bilateral feet. Nails 1-5 bilateral  are thickened discolored and elongated with subungual debris. Hyperkeratotic lesion noted bilateral plantar fifth metatarsal heads.   Musculoskeletal: MMT 5/5 bilateral lower extremities in DF, PF, Inversion and Eversion. Deceased ROM in DF of ankle joint.  Neurological: Sensation intact to light touch. Protective sensation diminished bilateral.    Assessment:   1. Pain due to onychomycosis of toenails of both feet   2. Type 2 diabetes mellitus with hyperglycemia, without long-term current use of insulin (HCC)   3. Callus of foot      Plan:  Patient was evaluated and treated and all questions answered. -Discussed and educated patient on diabetic foot care, especially with  regards to the vascular, neurological and  musculoskeletal systems.  -Stressed the importance of good glycemic control and the detriment of not  controlling glucose levels in relation to the foot. -Discussed supportive shoes at all times and checking feet regularly.  -Mechanically debrided all nails 1-5 bilateral using sterile nail nipper and filed with dremel without incident  -Answered all patient questions -Patient to return  in 3 months for at risk foot care -Patient advised to call the office if any problems or questions arise in the meantime.   Louann Sjogren, DPM

## 2023-04-19 ENCOUNTER — Other Ambulatory Visit: Payer: 59

## 2023-04-19 NOTE — Patient Instructions (Signed)
Visit Information  Ms. Danielle Hurley was given information about Medicaid Managed Care team care coordination services as a part of their [facility] Community Plan Medicaid benefit. Danielle Hurley verbally consented to engagement with the [facility] Managed Care team.   If you are experiencing a medical emergency, please call 911 or report to your local emergency department or urgent care.   If you have a non-emergency medical problem during routine business hours, please contact your provider's office and ask to speak with a nurse.   For questions related to your [facility], please call: [phone] or visit the homepage here: [email or web address]  If you would like to schedule transportation through your [facility], please call the following number at least 2 days in advance of your appointment: [phone]   Rides for urgent appointments can also be made after hours by calling Member Services.  Call the Behavioral Health Crisis Line at [phone], at any time, 24 hours a day, 7 days a week. If you are in danger or need immediate medical attention call 911.  If you would like help to quit smoking, call 1-800-QUIT-NOW (541-572-4172) OR Espaol: 1-855-Djelo-Ya (6-644-034-7425) o para ms informacin haga clic aqu or Text READY to 956-387 to register via text  Ms. Danielle Hurley - following are the goals we discussed in your visit today:   Goals Addressed   None     Social worker will follow up in 30 days  Gus Puma, Kenard Gower, [facility]  Managed Medicaid Social Worker [phone]   Following is a copy of your plan of care:  There are no care plans that you recently modified to display for this patient.

## 2023-04-19 NOTE — Patient Outreach (Signed)
Medicaid Managed Care Social Work Note  04/19/2023 Name:  Danielle Hurley MRN:  [id number] DOB:  23-May-1961  Danielle Hurley is an 62 y.o. year old female who is a primary patient of Hermenia Fiscal, Kathlynn Grate, NP.  The Medicaid Managed Care Coordination team was consulted for assistance with:  Community Resources   Danielle Hurley was given information about Medicaid Managed Care Coordination team services today. Danielle Hurley Patient agreed to services and verbal consent obtained.  Engaged with patient  for by telephone forfollow up visit in response to referral for case management and/or care coordination services.   Assessments/Interventions:  Review of past medical history, allergies, medications, health status, including review of consultants reports, laboratory and other test data, was performed as part of comprehensive evaluation and provision of chronic care management services.  SDOH: (Social Determinant of Health) assessments and interventions performed: BSW completed a telephone outreach with patient. She states she would like to speak with LCSW due to some anxiety. Patient would also like some social resources to be able to interact with other people. BSW will send patient some resources for activities and get patient scheduled with LCSW.  Advanced Directives Status:  Not addressed in this encounter.  Care Plan                 No Known Allergies  Medications Reviewed Today     Reviewed by Louann Sjogren, DPM (Physician) on 04/14/23 at 1105  Med List Status: <None>   Medication Order Taking? Sig Documenting Provider Last Dose Status Informant  acetaminophen (TYLENOL) 325 MG tablet [id number] No Take 650 mg by mouth every 6 (six) hours as needed. [provider] 03/21/2023 Active   aspirin-acetaminophen-caffeine (EXCEDRIN MIGRAINE) 782-956-21 MG tablet [id number] No Take 1 tablet by mouth every 6 (six) hours as needed for headache. [provider] Past Week Active Self   atorvastatin (LIPITOR) 20 MG tablet [id number] No Take 1 tablet (20 mg total) by mouth daily. Carlean Jews, NP 03/21/2023 Active   Bismuth Subsalicylate (KAOPECTATE) 262 MG TABS [id number] No Take 262 mg by mouth daily as needed (upset stomach). [provider] Unknown Active Self  blood glucose meter kit and supplies [id number] No Dispense based on patient and insurance preference. Use up to four times daily as directed. (FOR ICD-10 E10.9, E11.9).  Patient not taking: Reported on 03/22/2023   Carlean Jews, NP Not Taking Active   calcium carbonate (TUMS - DOSED IN MG ELEMENTAL CALCIUM) 500 MG chewable tablet [id number] No Chew 1-2 tablets by mouth daily as needed for indigestion or heartburn.  Patient not taking: Reported on 03/22/2023   [provider] Not Taking Active Self  celecoxib (CELEBREX) 200 MG capsule [id number]  TAKE 1 CAPSULE BY MOUTH TWICE DAILY AS NEEDED Boscia, Heather E, NP  Active   cetirizine (ZYRTEC) 10 MG tablet [id number] No Take 10 mg by mouth daily as needed for allergies. [provider] Past Week Active Self  Cholecalciferol (VITAMIN D) 50 MCG (2000 UT) tablet [id number] No Take 2,000 Units by mouth daily. [provider] Past Week Active Self  fluconazole (DIFLUCAN) 100 MG tablet [id number] No Take 1 tablet (100 mg total) by mouth daily. Imogene Burn, MD 03/21/2023 Active   glimepiride (AMARYL) 4 MG tablet [id number] No Take 1 tablet (4 mg total) by mouth 2 (two) times daily. Carlean Jews, NP 03/21/2023 Active   glucose blood (TRUETRACK TEST) test strip [id number] No Bloodsugar testing QD and prn  Patient not taking: Reported on  03/22/2023   Carlean Jews, NP Not Taking Active   guaifenesin (HUMIBID E) 400 MG TABS tablet [id number] No Take 400 mg by mouth daily as needed. [provider] Unknown Active Self  metformin (FORTAMET) 1000 MG (OSM) 24 hr tablet [id number] No Take 1 tablet po BID Carlean Jews, NP 03/21/2023  Active   mupirocin ointment (BACTROBAN) 2 % [id number] No Apply small amount to effected area twice daily for 2 weeks then as needed Carlean Jews, NP Unknown Active Self  oxybutynin (DITROPAN-XL) 10 MG 24 hr tablet [id number]  Take 1 tablet (10 mg total) by mouth at bedtime. Carlean Jews, NP  Active   pantoprazole (PROTONIX) 40 MG tablet [id number] No Take 1 tablet (40 mg total) by mouth daily. Carlean Jews, NP 03/21/2023 Active   rOPINIRole (REQUIP) 0.5 MG tablet [id number] No Take 1-2 tablets (0.5-1 mg total) by mouth at bedtime. Carlean Jews, NP Unknown Active   saxagliptin HCl (ONGLYZA) 5 MG TABS tablet [id number]  TAKE 1 TABLET BY MOUTH ONCE DAILY *STOP  JANUVIA* Boscia, Heather E, NP  Active   valsartan (DIOVAN) 160 MG tablet [id number]  Take 1 tablet by mouth once daily Carlean Jews, NP  Active             Patient Active Problem List   Diagnosis Date Noted   Chest pain 07/16/2022   Abnormal ECG 07/16/2022   Chronic left-sided low back pain with left-sided sciatica 07/16/2022   Restless leg syndrome 07/16/2022   Leg cramps 07/16/2022   Abnormal weight gain 07/16/2022   Type 2 diabetes mellitus with hyperglycemia, without long-term current use of insulin (HCC) 05/31/2022   Urinary tract infection with hematuria 05/31/2022   Gastroesophageal reflux disease without esophagitis 05/31/2022   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   Cancer screening 05/19/2022   Overactive bladder 11/02/2021   Hepatic cirrhosis (HCC) 10/21/2021   Immunization counseling 10/21/2021   Pulmonary nodule 08/19/2021   Body mass index 27.0-27.9, adult 08/19/2021   Need for hepatitis B vaccination 07/22/2021   Cellulitis of ear, right 07/08/2021   TB (pulmonary tuberculosis) 06/13/2021   Medication monitoring encounter 06/13/2021   Upper airway cough syndrome 05/08/2021   TB of lung w/ cavitation 05/08/2021   Hypertension associated with diabetes (HCC) 03/31/2021   Diabetes  mellitus without complication (HCC) 03/31/2021   Hyperlipidemia associated with type 2 diabetes mellitus (HCC) 03/31/2021   Encounter to establish care 03/31/2021   History of active tuberculosis 03/31/2021   Wheezing 03/31/2021   Chronic hepatitis C without hepatic coma (HCC) 03/31/2021   Chronic left shoulder pain 03/31/2021   Dysuria 03/31/2021   Vaginal yeast infection 03/31/2021    Conditions to be addressed/monitored per PCP order:   community resources  There are no care plans that you recently modified to display for this patient.   Follow up:  Patient agrees to Care Plan and Follow-up.  Plan: The Managed Medicaid care management team will reach out to the patient again over the next 30 days.  Date/time of next scheduled Social Work care management/care coordination outreach:  05/20/23  Gus Puma, Kenard Gower, [facility]  Managed [facility] Social Worker [phone]

## 2023-04-20 ENCOUNTER — Other Ambulatory Visit: Payer: Self-pay

## 2023-04-20 ENCOUNTER — Telehealth: Payer: Self-pay

## 2023-04-20 DIAGNOSIS — E1165 Type 2 diabetes mellitus with hyperglycemia: Secondary | ICD-10-CM

## 2023-04-20 DIAGNOSIS — E119 Type 2 diabetes mellitus without complications: Secondary | ICD-10-CM

## 2023-04-20 DIAGNOSIS — N3281 Overactive bladder: Secondary | ICD-10-CM

## 2023-04-20 MED ORDER — OXYBUTYNIN CHLORIDE ER 10 MG PO TB24
10.0000 mg | ORAL_TABLET | Freq: Every day | ORAL | 0 refills | Status: DC
Start: 2023-04-20 — End: 2023-05-11

## 2023-04-20 MED ORDER — SAXAGLIPTIN HCL 5 MG PO TABS
ORAL_TABLET | ORAL | 0 refills | Status: DC
Start: 2023-04-20 — End: 2023-05-25

## 2023-04-20 MED ORDER — GLIMEPIRIDE 4 MG PO TABS
4.0000 mg | ORAL_TABLET | Freq: Two times a day (BID) | ORAL | 1 refills | Status: DC
Start: 2023-04-20 — End: 2023-11-10

## 2023-04-20 NOTE — Telephone Encounter (Signed)
Rx's were sent to [facility] 

## 2023-04-20 NOTE — Telephone Encounter (Signed)
Pt is requesting a refill on:  glimepiride (AMARYL) 4 MG tablet    oxybutynin (DITROPAN-XL) 10 MG 24 hr tablet  saxagliptin HCl (ONGLYZA) 5 MG TABS tablet   Pt is requesting a 90 day supply  Pharmacy: [id number] - [place] ([place]), [place] - [address]   LOV 12/05/08 ROV 08/06/03

## 2023-04-22 ENCOUNTER — Ambulatory Visit
Admission: RE | Admit: 2023-04-22 | Discharge: 2023-04-22 | Disposition: A | Discharge status: Home / Self Care | Payer: Medicaid Other | Source: Ambulatory Visit | Attending: Internal Medicine | Admitting: Internal Medicine

## 2023-04-22 DIAGNOSIS — R1011 Right upper quadrant pain: Secondary | ICD-10-CM | POA: Diagnosis not present

## 2023-04-22 DIAGNOSIS — B1920 Unspecified viral hepatitis C without hepatic coma: Secondary | ICD-10-CM

## 2023-04-22 DIAGNOSIS — R1310 Dysphagia, unspecified: Secondary | ICD-10-CM

## 2023-04-22 DIAGNOSIS — Z1211 Encounter for screening for malignant neoplasm of colon: Secondary | ICD-10-CM

## 2023-04-22 DIAGNOSIS — I700 Atherosclerosis of aorta: Secondary | ICD-10-CM | POA: Diagnosis not present

## 2023-04-22 MED ORDER — IOPAMIDOL (ISOVUE-300) INJECTION 61%
100.0000 mL | Freq: Once | INTRAVENOUS | Status: AC | PRN
  Administered 2023-04-22: 100 mL via INTRAVENOUS

## 2023-04-27 ENCOUNTER — Other Ambulatory Visit: Payer: Self-pay | Admitting: Nurse Practitioner

## 2023-04-27 DIAGNOSIS — G8929 Other chronic pain: Secondary | ICD-10-CM

## 2023-04-27 DIAGNOSIS — M064 Inflammatory polyarthropathy: Secondary | ICD-10-CM

## 2023-04-28 ENCOUNTER — Other Ambulatory Visit: Payer: Self-pay

## 2023-04-28 DIAGNOSIS — K831 Obstruction of bile duct: Secondary | ICD-10-CM

## 2023-04-28 NOTE — Progress Notes (Signed)
Hi Beth, let's get her scheduled for MRI abdomen/MRCP w/wo contrast for further evaluation of CBD dilation.

## 2023-04-29 ENCOUNTER — Telehealth: Payer: Self-pay

## 2023-04-29 ENCOUNTER — Other Ambulatory Visit: Payer: Self-pay | Admitting: Nurse Practitioner

## 2023-04-29 DIAGNOSIS — E1165 Type 2 diabetes mellitus with hyperglycemia: Secondary | ICD-10-CM

## 2023-04-29 MED ORDER — METFORMIN HCL ER (OSM) 500 MG PO TB24
ORAL_TABLET | ORAL | 3 refills | Status: DC
Start: 2023-04-29 — End: 2023-04-30

## 2023-04-29 NOTE — Telephone Encounter (Signed)
Patient called office to get a refill on her saxagliptin, patient states that she is unable to contact her pharmacy, please advise, thanks.

## 2023-04-29 NOTE — Telephone Encounter (Signed)
Changed the rx to metformin XR 500 mg taking 2 tablets daily and sent to [facility]

## 2023-04-29 NOTE — Telephone Encounter (Signed)
Rx was filled on 04/20/2023 pt stated she will reach out to her pharmacy

## 2023-04-30 ENCOUNTER — Other Ambulatory Visit: Payer: Medicaid Other | Admitting: Licensed Clinical Social Worker

## 2023-04-30 MED ORDER — METFORMIN HCL ER 500 MG PO TB24
500.0000 mg | ORAL_TABLET | Freq: Two times a day (BID) | ORAL | 1 refills | Status: DC
Start: 2023-04-30 — End: 2023-11-10

## 2023-04-30 NOTE — Progress Notes (Signed)
Preferred formulary is Glucophage-XR. Script has been sent to [facility].

## 2023-04-30 NOTE — Patient Outreach (Addendum)
Medicaid Managed Care Social Work Note  04/30/2023 Name:  Danielle Hurley MRN:  [id number] DOB:  February 23, 1961  Danielle Hurley is an 62 y.o. year old female who is a primary patient of Hermenia Fiscal, Kathlynn Grate, NP.  The Medicaid Managed Care Coordination team was consulted for assistance with:  Mental Health Counseling and Resources  [name] was given information about Medicaid Managed Care Coordination team services today. [name] Patient agreed to services and verbal consent obtained.  Engaged with patient  for by telephone forinitial visit in response to referral for case management and/or care coordination services.   Assessments/Interventions:  Review of past medical history, allergies, medications, health status, including review of consultants reports, laboratory and other test data, was performed as part of comprehensive evaluation and provision of chronic care management services.  SDOH: (Social Determinant of Health) assessments and interventions performed: SDOH Interventions    Flowsheet Row Patient Outreach Telephone from 04/30/2023 in [place] [facility] DEPARTMENT  SDOH Interventions   Depression Interventions/Treatment  Counseling  [place] and Psychiatry Interest]  Stress Interventions Offered [employer], Provide Counseling       Advanced Directives Status:  See Care Plan for related entries.  Care Plan                 No Known Allergies  Medications Reviewed Today     Reviewed by Louann Sjogren, DPM (Physician) on 04/14/23 at 1105  Med List Status: <None>   Medication Order Taking? Sig Documenting Provider Last Dose Status Informant  acetaminophen (TYLENOL) 325 MG tablet [id number] No Take 650 mg by mouth every 6 (six) hours as needed. [provider] 03/21/2023 Active   aspirin-acetaminophen-caffeine (EXCEDRIN MIGRAINE) 782-956-21 MG tablet [id number] No Take 1 tablet by mouth every 6 (six) hours as needed for headache. [provider] Past Week Active Self  atorvastatin (LIPITOR) 20 MG tablet [id number] No Take 1 tablet (20 mg total) by mouth daily. Carlean Jews, NP 03/21/2023 Active   Bismuth Subsalicylate (KAOPECTATE) 262 MG TABS [id number] No Take 262 mg by mouth daily as needed (upset stomach). [provider] Unknown Active Self  blood glucose meter kit and supplies [id number] No Dispense based on patient and insurance preference. Use up to four times daily as directed. (FOR ICD-10 E10.9, E11.9).  Patient not taking: Reported on 03/22/2023   Carlean Jews, NP Not Taking Active   calcium carbonate (TUMS - DOSED IN MG ELEMENTAL CALCIUM) 500 MG chewable tablet [id number] No Chew 1-2 tablets by mouth daily as needed for indigestion or heartburn.  Patient not taking: Reported on 03/22/2023   [provider] Not Taking Active Self  celecoxib (CELEBREX) 200 MG capsule [id number]  TAKE 1 CAPSULE BY MOUTH TWICE DAILY AS NEEDED Boscia, Heather E, NP  Active   cetirizine (ZYRTEC) 10 MG tablet [id number] No Take 10 mg by mouth daily as needed for allergies. [provider] Past Week Active Self  Cholecalciferol (VITAMIN D) 50 MCG (2000 UT) tablet [id number] No Take 2,000 Units by mouth daily. [provider] Past Week Active Self  fluconazole (DIFLUCAN) 100 MG tablet [id number] No Take 1 tablet (100 mg total) by mouth daily. Imogene Burn, MD 03/21/2023 Active   glimepiride (AMARYL) 4 MG tablet [id number] No Take 1 tablet (4 mg total) by mouth 2 (two) times daily. Carlean Jews, NP 03/21/2023 Active   glucose blood (TRUETRACK TEST) test strip [id number] No Bloodsugar testing QD and prn  Patient not taking: Reported on 03/22/2023  Carlean Jews, NP Not Taking Active   guaifenesin (HUMIBID E) 400 MG TABS tablet [id number] No Take 400 mg by mouth daily as needed. [provider] Unknown Active Self  metformin (FORTAMET) 1000 MG (OSM) 24 hr tablet [id number] No Take 1 tablet po  BID Carlean Jews, NP 03/21/2023 Active   mupirocin ointment (BACTROBAN) 2 % [id number] No Apply small amount to effected area twice daily for 2 weeks then as needed Carlean Jews, NP Unknown Active Self  oxybutynin (DITROPAN-XL) 10 MG 24 hr tablet [id number]  Take 1 tablet (10 mg total) by mouth at bedtime. Carlean Jews, NP  Active   pantoprazole (PROTONIX) 40 MG tablet [id number] No Take 1 tablet (40 mg total) by mouth daily. Carlean Jews, NP 03/21/2023 Active   rOPINIRole (REQUIP) 0.5 MG tablet [id number] No Take 1-2 tablets (0.5-1 mg total) by mouth at bedtime. Carlean Jews, NP Unknown Active   saxagliptin HCl (ONGLYZA) 5 MG TABS tablet [id number]  TAKE 1 TABLET BY MOUTH ONCE DAILY *STOP  JANUVIA* Boscia, Heather E, NP  Active   valsartan (DIOVAN) 160 MG tablet [id number]  Take 1 tablet by mouth once daily Carlean Jews, NP  Active             Patient Active Problem List   Diagnosis Date Noted   Chest pain 07/16/2022   Abnormal ECG 07/16/2022   Chronic left-sided low back pain with left-sided sciatica 07/16/2022   Restless leg syndrome 07/16/2022   Leg cramps 07/16/2022   Abnormal weight gain 07/16/2022   Type 2 diabetes mellitus with hyperglycemia, without long-term current use of insulin (HCC) 05/31/2022   Urinary tract infection with hematuria 05/31/2022   Gastroesophageal reflux disease without esophagitis 05/31/2022   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   Cancer screening 05/19/2022   Overactive bladder 11/02/2021   Hepatic cirrhosis (HCC) 10/21/2021   Immunization counseling 10/21/2021   Pulmonary nodule 08/19/2021   Body mass index 27.0-27.9, adult 08/19/2021   Need for hepatitis B vaccination 07/22/2021   Cellulitis of ear, right 07/08/2021   TB (pulmonary tuberculosis) 06/13/2021   Medication monitoring encounter 06/13/2021   Upper airway cough syndrome 05/08/2021   TB of lung w/ cavitation 05/08/2021   Hypertension associated with  diabetes (HCC) 03/31/2021   Diabetes mellitus without complication (HCC) 03/31/2021   Hyperlipidemia associated with type 2 diabetes mellitus (HCC) 03/31/2021   Encounter to establish care 03/31/2021   History of active tuberculosis 03/31/2021   Wheezing 03/31/2021   Chronic hepatitis C without hepatic coma (HCC) 03/31/2021   Chronic left shoulder pain 03/31/2021   Dysuria 03/31/2021   Vaginal yeast infection 03/31/2021    Conditions to be addressed/monitored per PCP order:  Anxiety and Depression  Priority: High  Timeframe:  Short-Range Goal Priority:  High Start Date:   04/30/23          Expected End Date:  ongoing                     Follow Up Date--05/12/23 at 1 pm  - keep 90 percent of scheduled appointments -consider counseling or psychiatry -consider bumping up your self-care  -consider creating a stronger support network   Why is this important?             Combatting depression may take some time.            If you don't feel better right away, don't give up on your treatment plan.  Current barriers:   Chronic Mental Health needs related to depression, stress and caregiver stress. Patient requires Support, Education, Resources, Referrals, Advocacy, and Care Coordination, in order to meet Unmet Mental Health Needs. Patient will implement clinical interventions discussed today to decrease symptoms of depression and increase knowledge and/or ability of: coping skills. Mental Health Concerns and Social Isolation Patient lacks knowledge of available community counseling agencies and resources.  Clinical Goal(s): verbalize understanding of plan for management of Anxiety, Depression, and Stress and demonstrate a reduction in symptoms. Patient will connect with a provider for ongoing mental health treatment, increase coping skills, healthy habits, self-management skills, and stress reduction        Clinical Interventions:  Assessed patient's previous and current treatment,  coping skills, support system and barriers to care. Patient provided hx. Patient is the primary caregiver of her deaf brother who resides with her. She reports that family came from [place] 3 years ago. Patient reports 3 or 4 adult kids live nearby. Verbalization of feelings encouraged, motivational interviewing employed Emotional support provided, positive coping strategies explored. Establishing healthy boundaries emphasized and healthy self-care education provided Patient was educated on available mental health resources within their area that accept Medicaid and offer counseling and psychiatry. Email sent to patient today with available mental health resources within her area that accept Medicaid and offer the services that she is interested in. Email included instructions for scheduling at [facility] as well as some crisis support resources and [facility]'s walk in clinic hours Patient reports significant worsening anxiety and depression affecting her ability to function appropriately and carry out daily task. She reports having trouble getting out of the bed some days.  LCSW provided education on relaxation techniques such as meditation, deep breathing, massage, grounding exercises or yoga that can activate the body's relaxation response and ease symptoms of stress and anxiety. LCSW ask that when pt is struggling with difficult emotions and racing thoughts that they start this relaxation response process. LCSW provided extensive education on healthy coping skills for anxiety. SW used active and reflective listening, validated patient's feelings/concerns, and provided emotional support. Patient will work on implementing appropriate self-care habits into their daily routine such as: staying positive, writing a gratitude list, drinking water, staying active around the house, taking their medications as prescribed, combating negative thoughts or emotions and staying connected with their family and friends.  Positive reinforcement provided for this decision to work on this. LCSW provided education on healthy sleep hygiene and what that looks like. LCSW encouraged patient to implement a night time routine into their schedule that works best for them and that they are able to maintain. Advised patient to implement deep breathing/grounding/meditation/self-care exercises into their nightly routine to combat racing thoughts at night. LCSW encouraged patient to wake up at the same time each day, make their sleeping environment comfortable, exercise when able, to limit naps and to not eat or drink anything right before bed.  Motivational Interviewing employed Depression screen reviewed  PHQ2/ PHQ9 completed or reviewed  Mindfulness or Relaxation training provided Active listening / Reflection utilized  Advance Care and HCPOA education provided Emotional Support Provided Problem Solving /Task Center strategies reviewed Provided psychoeducation for mental health needs  Provided brief CBT  Reviewed mental health medications and discussed importance of compliance:  Quality of sleep assessed & Sleep Hygiene techniques promoted  Participation in counseling encouraged  Verbalization of feelings encouraged  Suicidal Ideation/Homicidal Ideation assessed: Patient denies SI/HI  Review resources, discussed options and provided patient information about  Mental Health Resources Inter-disciplinary care team collaboration (see longitudinal plan of care) Patient Goals/Self-Care Activities: Over the next 120 days Attend scheduled medical appointments Utilize healthy coping skills and supportive resources discussed Contact PCP with any questions or concerns Keep 90 percent of counseling appointments Call your insurance provider for more information about your Enhanced Benefits  Check out counseling resources provided  Begin personal counseling with LCSW, to reduce and manage symptoms of Depression and Stress, until  well-established with mental health provider Accept all calls from representative with [facility] in an effort to establish ongoing mental health counseling and supportive services. Incorporate into daily practice - relaxation techniques, deep breathing exercises, and mindfulness meditation strategies. Talk about feelings with friends, family members, spiritual advisor, etc. Contact LCSW directly [phone]), if you have questions, need assistance, or if additional social work needs are identified between now and our next scheduled telephone outreach call. Call 988 for mental health hotline/crisis line if needed (24/7 available) Try techniques to reduce symptoms of anxiety/negative thinking (deep breathing, distraction, positive self talk, etc)  - develop a personal safety plan - develop a plan to deal with triggers like holidays, anniversaries - exercise at least 2 to 3 times per week - have a plan for how to handle bad days - journal feelings and what helps to feel better or worse - spend time or talk with others at least 2 to 3 times per week - watch for early signs of feeling worse - begin personal counseling - call and visit an old friend - check out volunteer opportunities - join a support group - laugh; watch a funny movie or comedian - learn and use visualization or guided imagery - perform a random act of kindness - practice relaxation or meditation daily - start or continue a personal journal - practice positive thinking and self-talk -continue with compliance of taking medication  -identify current effective and ineffective coping strategies.  -implement positive self-talk in care to increase self-esteem, confidence and feelings of control.  -consider alternative and complementary therapy approaches such as meditation, mindfulness or yoga.  -journaling, prayer, worship services, meditation or pastoral counseling.  -increase participation in pleasurable group activities such as  hobbies, singing, sports or volunteering).  -consider the use of meditative movement therapy such as tai chi, yoga or qigong.  -start a regular daily exercise program based on tolerance, ability and patient choice to support positive thinking and activity    If you are experiencing a Mental Health or Behavioral Health Crisis or need someone to talk to, please call the Suicide and Crisis Lifeline: 988    Patient Goals: Initial goal  Follow up:  Patient agrees to Care Plan and Follow-up.  Plan: The Managed Medicaid care management team will reach out to the patient again over the next 30 days.  Dickie La, BSW, MSW, [employer] Managed Medicaid LCSW [facility]  [facility] [email or web address] Phone: [phone]

## 2023-04-30 NOTE — Addendum Note (Signed)
Addended by: Tonny Bollman on: 04/30/2023 10:35 AM   Modules accepted: Orders

## 2023-05-02 NOTE — Patient Instructions (Signed)
Visit Information  Danielle Hurley was given information about Medicaid Managed Care team care coordination services as a part of their [facility] Community Plan Medicaid benefit. [name] verbally consented to engagement with the [facility] Managed Care team.   If you are experiencing a medical emergency, please call 911 or report to your local emergency department or urgent care.   If you have a non-emergency medical problem during routine business hours, please contact your provider's office and ask to speak with a nurse.   For questions related to your [facility], please call: [phone] or visit the homepage here: [email or web address]  If you would like to schedule transportation through your [facility], please call the following number at least 2 days in advance of your appointment: [phone]   Rides for urgent appointments can also be made after hours by calling Member Services.  Call the Behavioral Health Crisis Line at [phone] [phone], at any time, 24 hours a day, 7 days a week. If you are in danger or need immediate medical attention call 911.  If you would like help to quit smoking, call 1-800-QUIT-NOW (564-635-2215) OR Espaol: 1-855-Djelo-Ya (5-573-220-2542) o para ms informacin haga clic aqu or Text READY to 706-237 to register via text  Dickie La, BSW, MSW, [employer] Managed Medicaid LCSW [facility]  [facility] [email or web address] Phone: [phone]

## 2023-05-04 ENCOUNTER — Ambulatory Visit (HOSPITAL_COMMUNITY)
Admission: RE | Admit: 2023-05-04 | Discharge: 2023-05-04 | Disposition: A | Discharge status: Home / Self Care | Payer: Medicaid Other | Source: Ambulatory Visit | Attending: Internal Medicine | Admitting: Internal Medicine

## 2023-05-04 ENCOUNTER — Other Ambulatory Visit: Payer: Self-pay | Admitting: Internal Medicine

## 2023-05-04 DIAGNOSIS — K831 Obstruction of bile duct: Secondary | ICD-10-CM

## 2023-05-04 DIAGNOSIS — R935 Abnormal findings on diagnostic imaging of other abdominal regions, including retroperitoneum: Secondary | ICD-10-CM | POA: Diagnosis not present

## 2023-05-04 DIAGNOSIS — N281 Cyst of kidney, acquired: Secondary | ICD-10-CM | POA: Diagnosis not present

## 2023-05-04 DIAGNOSIS — R1011 Right upper quadrant pain: Secondary | ICD-10-CM | POA: Diagnosis not present

## 2023-05-04 MED ORDER — GADOBUTROL 1 MMOL/ML IV SOLN
9.0000 mL | Freq: Once | INTRAVENOUS | Status: AC | PRN
  Administered 2023-05-04: 9 mL via INTRAVENOUS

## 2023-05-11 ENCOUNTER — Other Ambulatory Visit: Payer: Self-pay | Admitting: Nurse Practitioner

## 2023-05-11 DIAGNOSIS — N3281 Overactive bladder: Secondary | ICD-10-CM

## 2023-05-12 ENCOUNTER — Other Ambulatory Visit: Payer: Medicaid Other | Admitting: Licensed Clinical Social Worker

## 2023-05-12 DIAGNOSIS — Z638 Other specified problems related to primary support group: Secondary | ICD-10-CM

## 2023-05-12 NOTE — Patient Instructions (Signed)
Visit Information  Danielle Hurley was given information about Medicaid Managed Care team care coordination services as a part of their [facility] Community Plan Medicaid benefit. Danielle Hurley verbally consented to engagement with the [facility] Managed Care team.   If you are experiencing a medical emergency, please call 911 or report to your local emergency department or urgent care.   If you have a non-emergency medical problem during routine business hours, please contact your provider's office and ask to speak with a nurse.   For questions related to your [facility], please call: [phone] or visit the homepage here: [email or web address]  If you would like to schedule transportation through your [facility], please call the following number at least 2 days in advance of your appointment: [phone]   Rides for urgent appointments can also be made after hours by calling Member Services.  Call the Behavioral Health Crisis Line at [phone], at any time, 24 hours a day, 7 days a week. If you are in danger or need immediate medical attention call 911.  If you would like help to quit smoking, call 1-800-QUIT-NOW (863-377-9695) OR Espaol: 1-855-Djelo-Ya (1-324-401-0272) o para ms informacin haga clic aqu or Text READY to 536-644 to register via text   Following is a copy of your plan of care:  Care Plan : LCSW Plan of Care  Updates made by [name], LCSW since 05/12/2023 12:00 AM     Problem: Depression Identification (Depression)      Goal: Depressive Symptoms Identified   Note:   Priority: High  Timeframe:  Short-Range Goal Priority:  High Start Date:   04/30/23          Expected End Date:  ongoing                     Follow Up Date--05/25/23 at 1 pm  - keep 90 percent of scheduled appointments -consider counseling or psychiatry -consider bumping up  your self-care  -consider creating a stronger support network   Why is this important?             Combatting depression may take some time.            If you don't feel better right away, don't give up on your treatment plan.    Current barriers:   Chronic Mental Health needs related to depression, stress and caregiver stress. Patient requires Support, Education, Resources, Referrals, Advocacy, and Care Coordination, in order to meet Unmet Mental Health Needs. Patient will implement clinical interventions discussed today to decrease symptoms of depression and increase knowledge and/or ability of: coping skills. Mental Health Concerns and Social Isolation Patient lacks knowledge of available community counseling agencies and resources.  Clinical Goal(s): verbalize understanding of plan for management of Anxiety, Depression, and Stress and demonstrate a reduction in symptoms. Patient will connect with a provider for ongoing mental health treatment, increase coping skills, healthy habits, self-management skills, and stress reduction        Patient Goals/Self-Care Activities: Over the next 120 days Attend scheduled medical appointments Utilize healthy coping skills and supportive resources discussed Contact PCP with any questions or concerns Keep 90 percent of counseling appointments Call your insurance provider for more information about your Enhanced Benefits  Check out counseling resources provided  Begin personal counseling with LCSW, to reduce and manage symptoms of Depression and Stress, until well-established with mental health provider Accept all calls from representative with [facility] in an  effort to establish ongoing mental health counseling and supportive services. Incorporate into daily practice - relaxation techniques, deep breathing exercises, and mindfulness meditation strategies. Talk about feelings with friends, family members, spiritual advisor, etc. Contact LCSW directly  [phone]), if you have questions, need assistance, or if additional social work needs are identified between now and our next scheduled telephone outreach call. Call 988 for mental health hotline/crisis line if needed (24/7 available) Try techniques to reduce symptoms of anxiety/negative thinking (deep breathing, distraction, positive self talk, etc)  - develop a personal safety plan - develop a plan to deal with triggers like holidays, anniversaries - exercise at least 2 to 3 times per week - have a plan for how to handle bad days - journal feelings and what helps to feel better or worse - spend time or talk with others at least 2 to 3 times per week - watch for early signs of feeling worse - begin personal counseling - call and visit an old friend - check out volunteer opportunities - join a support group - laugh; watch a funny movie or comedian - learn and use visualization or guided imagery - perform a random act of kindness - practice relaxation or meditation daily - start or continue a personal journal - practice positive thinking and self-talk -continue with compliance of taking medication  -identify current effective and ineffective coping strategies.  -implement positive self-talk in care to increase self-esteem, confidence and feelings of control.  -consider alternative and complementary therapy approaches such as meditation, mindfulness or yoga.  -journaling, prayer, worship services, meditation or pastoral counseling.  -increase participation in pleasurable group activities such as hobbies, singing, sports or volunteering).  -consider the use of meditative movement therapy such as tai chi, yoga or qigong.  -start a regular daily exercise program based on tolerance, ability and patient choice to support positive thinking and activity    If you are experiencing a Mental Health or Behavioral Health Crisis or need someone to talk to, please call the Suicide and Crisis  Lifeline: 988    Patient Goals: Follow up goal     24- Hour Availability:    [facility]  [address] [place], [place] [phone] Crisis [phone]   [facility] [phone]  [place] [facility]  [phone]    [facility]  [phone] (after hours)   Therapeutic Alternative/Mobile Crisis   [phone]   [place] National Suicide Hotline  (980) 695-0290 [place] Child[place]) [place] [address]   Call [phone] for mental health emergencies   [facility]  [phone]);  [place] and [employer]  [phone]); [place], [place], [place], [place], [place], [place], [place]    [facility] for [facility] Residents For 24/7 walk-up access to mental health services for [facility] children (4+), adolescents and adults, please visit the [facility] located at [address] in [place], [place].  *[place] also provides comprehensive outpatient behavioral health services in a variety of locations around the [place].  Connect With [place] [address] [place], [place] [address] HelpLine: (229) 872-5248 or [phone]  Get Directions  Find Help 24/7 By Phone Call our 24-hour HelpLine at 564-791-6671 or ([phone] for immediate assistance for mental health and substance abuse issues.  Walk-In Help [place]: [facility] (Ages 4 and Up) [place]: Emergency Dept., [facility] Additional Resources National Hopeline Network: 1-800-SUICIDE The National Suicide  Prevention Lifeline: 1-800-273-TALK     10 LITTLE Things To Do When You're Feeling Too Down To Do Anything  Take a shower. Even if you plan to stay in all day long and not see a soul, take a shower. It takes the most effort to hop in to the shower but once you do, you'll feel immediate results. It will  wake you up and you'll be feeling much fresher (and cleaner too).  Brush and floss your teeth. Give your teeth a good brushing with a floss finish. It's a small task but it feels so good and you can check 'taking care of your health' off the list of things to do.  Do something small on your list. Most of [place] have some small thing we would like to get done (load of laundry, sew a button, email a friend). Doing one of these things will make you feel like you've accomplished something.  Drink water. Drinking water is easy right? It's also really beneficial for your health so keep a glass beside you all day and take sips often. It gives you energy and prevents you from boredom eating.  Do some floor exercises. The last thing you want to do is exercise but it might be just the thing you need the most. Keep it simple and do exercises that involve sitting or laying on the floor. Even the smallest of exercises release chemicals in the brain that make you feel good. Yoga stretches or core exercises are going to make you feel good with minimal effort.  Make your bed. Making your bed takes a few minutes but it's productive and you'll feel relieved when it's done. An unmade bed is a huge visual reminder that you're having an unproductive day. Do it and consider it your housework for the day.  Put on some nice clothes. Take the sweatpants off even if you don't plan to go anywhere. Put on clothes that make you feel good. Take a look in the mirror so your brain recognizes the sweatpants have been replaced with clothes that make you look great. It's an instant confidence booster.  Wash the dishes. A pile of dirty dishes in the sink is a reflection of your mood. It's possible that if you wash up the dishes, your mood will follow suit. It's worth a try.  Cook a real meal. If you have the luxury to have a "do nothing" day, you have time to make a real meal for yourself. Make a meal that you love to eat. The  process is good to get you out of the funk and the food will ensure you have more energy for tomorrow.  Write out your thoughts by hand. When you hand write, you stimulate your brain to focus on the moment that you're in so make yourself comfortable and write whatever comes into your mind. Put those thoughts out on paper so they stop spinning around in your head. Those thoughts might be the very thing holding you down.  Hang in there! Dickie La, BSW, MSW, [employer] Managed Medicaid LCSW [facility]  [facility] [email or web address] Phone: [phone]

## 2023-05-12 NOTE — Patient Outreach (Addendum)
Medicaid Managed Care Social Work Note  05/12/2023 Name:  Danielle Hurley MRN:  [id number] DOB:  1961-08-24  Danielle Hurley is an 62 y.o. year old female who is a primary patient of Hermenia Fiscal, Kathlynn Grate, NP.  The Medicaid Managed Care Coordination team was consulted for assistance with:  Mental Health Counseling and Resources  Danielle Hurley was given information about Medicaid Managed Care Coordination team services today. Danielle Hurley Patient agreed to services and verbal consent obtained.  Engaged with patient  for by telephone forfollow up visit in response to referral for case management and/or care coordination services.   Assessments/Interventions:  Review of past medical history, allergies, medications, health status, including review of consultants reports, laboratory and other test data, was performed as part of comprehensive evaluation and provision of chronic care management services.  SDOH: (Social Determinant of Health) assessments and interventions performed: SDOH Interventions    Flowsheet Row Patient Outreach Telephone from 05/12/2023 in [place] [facility] DEPARTMENT Patient Outreach Telephone from 04/30/2023 in [place] POPULATION HEALTH DEPARTMENT  SDOH Interventions    Depression Interventions/Treatment  -- Counseling  Print production planner and Psychiatry Interest]  Stress Interventions Offered [employer], Provide Counseling Offered [employer], Provide Counseling       Advanced Directives Status:  Not addressed in this encounter.  Care Plan                 No Known Allergies  Medications Reviewed Today     Reviewed by Louann Sjogren, DPM (Physician) on 04/14/23 at 1105  Med List Status: <None>   Medication Order Taking? Sig Documenting Provider Last Dose Status Informant  acetaminophen (TYLENOL) 325 MG tablet [id number] No Take 650 mg by mouth every 6 (six) hours as needed. [provider] 03/21/2023 Active    aspirin-acetaminophen-caffeine (EXCEDRIN MIGRAINE) 308-657-84 MG tablet [id number] No Take 1 tablet by mouth every 6 (six) hours as needed for headache. [provider] Past Week Active Self  atorvastatin (LIPITOR) 20 MG tablet [id number] No Take 1 tablet (20 mg total) by mouth daily. Carlean Jews, NP 03/21/2023 Active   Bismuth Subsalicylate (KAOPECTATE) 262 MG TABS [id number] No Take 262 mg by mouth daily as needed (upset stomach). [provider] Unknown Active Self  blood glucose meter kit and supplies [id number] No Dispense based on patient and insurance preference. Use up to four times daily as directed. (FOR ICD-10 E10.9, E11.9).  Patient not taking: Reported on 03/22/2023   Carlean Jews, NP Not Taking Active   calcium carbonate (TUMS - DOSED IN MG ELEMENTAL CALCIUM) 500 MG chewable tablet [id number] No Chew 1-2 tablets by mouth daily as needed for indigestion or heartburn.  Patient not taking: Reported on 03/22/2023   [provider] Not Taking Active Self  celecoxib (CELEBREX) 200 MG capsule [id number]  TAKE 1 CAPSULE BY MOUTH TWICE DAILY AS NEEDED Boscia, Heather E, NP  Active   cetirizine (ZYRTEC) 10 MG tablet [id number] No Take 10 mg by mouth daily as needed for allergies. [provider] Past Week Active Self  Cholecalciferol (VITAMIN D) 50 MCG (2000 UT) tablet [id number] No Take 2,000 Units by mouth daily. [provider] Past Week Active Self  fluconazole (DIFLUCAN) 100 MG tablet [id number] No Take 1 tablet (100 mg total) by mouth daily. Imogene Burn, MD 03/21/2023 Active   glimepiride (AMARYL) 4 MG tablet [id number] No Take 1 tablet (4 mg total) by mouth 2 (two) times daily. Carlean Jews, NP 03/21/2023 Active   glucose  blood (TRUETRACK TEST) test strip [id number] No Bloodsugar testing QD and prn  Patient not taking: Reported on 03/22/2023   Carlean Jews, NP Not Taking Active   guaifenesin (HUMIBID E) 400 MG TABS tablet [id number] No  Take 400 mg by mouth daily as needed. [provider] Unknown Active Self  metformin (FORTAMET) 1000 MG (OSM) 24 hr tablet [id number] No Take 1 tablet po BID Carlean Jews, NP 03/21/2023 Active   mupirocin ointment (BACTROBAN) 2 % [id number] No Apply small amount to effected area twice daily for 2 weeks then as needed Carlean Jews, NP Unknown Active Self  oxybutynin (DITROPAN-XL) 10 MG 24 hr tablet [id number]  Take 1 tablet (10 mg total) by mouth at bedtime. Carlean Jews, NP  Active   pantoprazole (PROTONIX) 40 MG tablet [id number] No Take 1 tablet (40 mg total) by mouth daily. Carlean Jews, NP 03/21/2023 Active   rOPINIRole (REQUIP) 0.5 MG tablet [id number] No Take 1-2 tablets (0.5-1 mg total) by mouth at bedtime. Carlean Jews, NP Unknown Active   saxagliptin HCl (ONGLYZA) 5 MG TABS tablet [id number]  TAKE 1 TABLET BY MOUTH ONCE DAILY *STOP  JANUVIA* Boscia, Heather E, NP  Active   valsartan (DIOVAN) 160 MG tablet [id number]  Take 1 tablet by mouth once daily Carlean Jews, NP  Active             Patient Active Problem List   Diagnosis Date Noted   Chest pain 07/16/2022   Abnormal ECG 07/16/2022   Chronic left-sided low back pain with left-sided sciatica 07/16/2022   Restless leg syndrome 07/16/2022   Leg cramps 07/16/2022   Abnormal weight gain 07/16/2022   Type 2 diabetes mellitus with hyperglycemia, without long-term current use of insulin (HCC) 05/31/2022   Urinary tract infection with hematuria 05/31/2022   Gastroesophageal reflux disease without esophagitis 05/31/2022   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   Cancer screening 05/19/2022   Overactive bladder 11/02/2021   Hepatic cirrhosis (HCC) 10/21/2021   Immunization counseling 10/21/2021   Pulmonary nodule 08/19/2021   Body mass index 27.0-27.9, adult 08/19/2021   Need for hepatitis B vaccination 07/22/2021   Cellulitis of ear, right 07/08/2021   TB (pulmonary tuberculosis) 06/13/2021    Medication monitoring encounter 06/13/2021   Upper airway cough syndrome 05/08/2021   TB of lung w/ cavitation 05/08/2021   Hypertension associated with diabetes (HCC) 03/31/2021   Diabetes mellitus without complication (HCC) 03/31/2021   Hyperlipidemia associated with type 2 diabetes mellitus (HCC) 03/31/2021   Encounter to establish care 03/31/2021   History of active tuberculosis 03/31/2021   Wheezing 03/31/2021   Chronic hepatitis C without hepatic coma (HCC) 03/31/2021   Chronic left shoulder pain 03/31/2021   Dysuria 03/31/2021   Vaginal yeast infection 03/31/2021    Conditions to be addressed/monitored per PCP order:  Depression  Care Plan : LCSW Plan of Care  Updates made by Danielle Bryant, LCSW since 05/12/2023 12:00 AM     Problem: Depression Identification (Depression)      Goal: Depressive Symptoms Identified   Note:   Priority: High  Timeframe:  Short-Range Goal Priority:  High Start Date:   04/30/23          Expected End Date:  ongoing                     Follow Up Date--05/25/23 at 1 pm  - keep 90 percent of scheduled appointments -consider counseling or psychiatry -consider bumping up  your self-care  -consider creating a stronger support network   Why is this important?             Combatting depression may take some time.            If you don't feel better right away, don't give up on your treatment plan.    Current barriers:   Chronic Mental Health needs related to depression, stress and caregiver stress. Patient requires Support, Education, Resources, Referrals, Advocacy, and Care Coordination, in order to meet Unmet Mental Health Needs. Patient will implement clinical interventions discussed today to decrease symptoms of depression and increase knowledge and/or ability of: coping skills. Mental Health Concerns and Social Isolation Patient lacks knowledge of available community counseling agencies and resources.  Clinical Goal(s): verbalize  understanding of plan for management of Anxiety, Depression, and Stress and demonstrate a reduction in symptoms. Patient will connect with a provider for ongoing mental health treatment, increase coping skills, healthy habits, self-management skills, and stress reduction        Clinical Interventions:  Assessed patient's previous and current treatment, coping skills, support system and barriers to care. Patient provided hx. Patient is the primary caregiver of her deaf brother who resides with her. She reports that family came from [place] 3 years ago. Patient reports 3 or 4 adult kids live nearby. Verbalization of feelings encouraged, motivational interviewing employed Emotional support provided, positive coping strategies explored. Establishing healthy boundaries emphasized and healthy self-care education provided Patient was educated on available mental health resources within their area that accept Medicaid and offer counseling and psychiatry. Email sent to patient today with available mental health resources within her area that accept Medicaid and offer the services that she is interested in. Email included instructions for scheduling at [facility] as well as some crisis support resources and [facility]'s walk in clinic hours Patient reports significant worsening anxiety and depression affecting her ability to function appropriately and carry out daily task. She reports having trouble getting out of the bed some days.  Patient receives strong support from spouse, MIL, and mentor LCSW provided education on relaxation techniques such as meditation, deep breathing, massage, grounding exercises or yoga that can activate the body's relaxation response and ease symptoms of stress and anxiety. LCSW ask that when pt is struggling with difficult emotions and racing thoughts that they start this relaxation response process. LCSW provided extensive education on healthy coping skills for anxiety. SW used active and  reflective listening, validated patient's feelings/concerns, and provided emotional support. Patient will work on implementing appropriate self-care habits into their daily routine such as: staying positive, writing a gratitude list, drinking water, staying active around the house, taking their medications as prescribed, combating negative thoughts or emotions and staying connected with their family and friends. Positive reinforcement provided for this decision to work on this. LCSW provided education on healthy sleep hygiene and what that looks like. LCSW encouraged patient to implement a night time routine into their schedule that works best for them and that they are able to maintain. Advised patient to implement deep breathing/grounding/meditation/self-care exercises into their nightly routine to combat racing thoughts at night. LCSW encouraged patient to wake up at the same time each day, make their sleeping environment comfortable, exercise when able, to limit naps and to not eat or drink anything right before bed.  Motivational Interviewing employed Depression screen reviewed  PHQ2/ PHQ9 completed or reviewed  Mindfulness or Relaxation training provided Active listening / Reflection utilized  Advance Care and  HCPOA education provided Emotional Support Provided Problem Solving /Task Center strategies reviewed Provided psychoeducation for mental health needs  Provided brief CBT  Reviewed mental health medications and discussed importance of compliance:  Quality of sleep assessed & Sleep Hygiene techniques promoted  Participation in counseling encouraged  Verbalization of feelings encouraged  Suicidal Ideation/Homicidal Ideation assessed: Patient denies SI/HI  Review resources, discussed options and provided patient information about  Mental Health Resources Inter-disciplinary care team collaboration (see longitudinal plan of care) 05/12/23- Patient reviewed list and is agreeable to referral  to [facility] as she wishes to stay within the [facility] network. She reports that they received some great health related news regarding her brother which has decreased her number of rides that she has to make each month, back and forth, traveling him these necessary medical appointments.   Patient Goals/Self-Care Activities: Over the next 120 days Attend scheduled medical appointments Utilize healthy coping skills and supportive resources discussed Contact PCP with any questions or concerns Keep 90 percent of counseling appointments Call your insurance provider for more information about your Enhanced Benefits  Check out counseling resources provided  Begin personal counseling with LCSW, to reduce and manage symptoms of Depression and Stress, until well-established with mental health provider Accept all calls from representative with [facility] in an effort to establish ongoing mental health counseling and supportive services. Incorporate into daily practice - relaxation techniques, deep breathing exercises, and mindfulness meditation strategies. Talk about feelings with friends, family members, spiritual advisor, etc. Contact LCSW directly [phone]), if you have questions, need assistance, or if additional social work needs are identified between now and our next scheduled telephone outreach call. Call [phone] for mental health hotline/crisis line if needed (24/7 available) Try techniques to reduce symptoms of anxiety/negative thinking (deep breathing, distraction, positive self talk, etc)  - develop a personal safety plan - develop a plan to deal with triggers like holidays, anniversaries - exercise at least 2 to 3 times per week - have a plan for how to handle bad days - journal feelings and what helps to feel better or worse - spend time or talk with others at least 2 to 3 times per week - watch for early signs of feeling worse - begin personal counseling - call and visit an old friend -  check out volunteer opportunities - join a support group - laugh; watch a funny movie or comedian - learn and use visualization or guided imagery - perform a random act of kindness - practice relaxation or meditation daily - start or continue a personal journal - practice positive thinking and self-talk -continue with compliance of taking medication  -identify current effective and ineffective coping strategies.  -implement positive self-talk in care to increase self-esteem, confidence and feelings of control.  -consider alternative and complementary therapy approaches such as meditation, mindfulness or yoga.  -journaling, prayer, worship services, meditation or pastoral counseling.  -increase participation in pleasurable group activities such as hobbies, singing, sports or volunteering).  -consider the use of meditative movement therapy such as tai chi, yoga or qigong.  -start a regular daily exercise program based on tolerance, ability and patient choice to support positive thinking and activity    If you are experiencing a Mental Health or Behavioral Health Crisis or need someone to talk to, please call the Suicide and Crisis Lifeline: 988    Patient Goals: Follow up goal     Follow up:  Patient agrees to Care Plan and Follow-up.  Plan: The Managed Medicaid  care management team will reach out to the patient again over the next 30 days.  Dickie La, BSW, MSW, [employer] Managed Medicaid LCSW [facility]  [facility] [email or web address] Phone: [phone]

## 2023-05-20 ENCOUNTER — Ambulatory Visit: Payer: Medicaid Other

## 2023-05-24 ENCOUNTER — Other Ambulatory Visit: Payer: Self-pay | Admitting: Nurse Practitioner

## 2023-05-24 DIAGNOSIS — E1165 Type 2 diabetes mellitus with hyperglycemia: Secondary | ICD-10-CM

## 2023-05-25 ENCOUNTER — Other Ambulatory Visit: Payer: Medicaid Other

## 2023-05-25 NOTE — Patient Instructions (Signed)
Visit Information  Ms. Loseke was given information about Medicaid Managed Care team care coordination services as a part of their [employer] Community Plan Medicaid benefit. Kyani Ebersole verbally consented  to engagement with the Medicaid Managed Care team.   If you are experiencing a medical emergency, please call 911 or report to your local emergency department or urgent care.   If you have a non-emergency medical problem during routine business hours, please contact your provider's office and ask to speak with a nurse.   For questions related to your [employer] Community Plan Medicaid, please call: [phone] or visit the homepage here: [email or web address]  If you would like to schedule transportation through your [employer] Community Plan Medicaid, please call the following number at least 2 days in advance of your appointment: [phone]   Rides for urgent appointments can also be made after hours by calling Member Services.  Call the Behavioral Health Crisis Line at 1-877-334-1141, at any time, 24 hours a day, 7 days a week. If you are in danger or need immediate medical attention call 911.  If you would like help to quit smoking, call 1-800-QUIT-NOW (1-800-784-8669) OR Espaol: 1-855-Djelo-Ya (1-855-335-3569) o para ms informacin haga clic aqu or Text READY to 200-400 to register via text  Ms. Rasberry - following are the goals we discussed in your visit today:   Goals Addressed   None      Social Worker will follow up in 30 days.   Taylr Meuth, BSW, MHA [place]  Managed Medicaid Social Worker [phone]   Following is a copy of your plan of care:  There are no care plans that you recently modified to display for this patient.   

## 2023-05-25 NOTE — Patient Outreach (Signed)
Medicaid Managed Care Social Work Note  05/25/2023 Name:  Danielle Hurley MRN:  [id number] DOB:  27-Sep-1961  Danielle Hurley is an 62 y.o. year old female who is a primary patient of Hermenia Hurley, Danielle Grate, NP.  The [facility] Managed Care Coordination team was consulted for assistance with:  Food Insecurity  Ms. Danielle Hurley was given information about Medicaid Managed Care Coordination team services today. Danielle Hurley Patient agreed to services and verbal consent obtained.  Engaged with patient  for by telephone forfollow up visit in response to referral for case management and/or care coordination services.   Assessments/Interventions:  Review of past medical history, allergies, medications, health status, including review of consultants reports, laboratory and other test data, was performed as part of comprehensive evaluation and provision of chronic care management services.  SDOH: (Social Determinant of Health) assessments and interventions performed: SDOH Interventions    Flowsheet Row Patient Outreach Telephone from 05/12/2023 in [place] POPULATION HEALTH DEPARTMENT Patient Outreach Telephone from 04/30/2023 in [place] POPULATION HEALTH DEPARTMENT  SDOH Interventions    Depression Interventions/Treatment  -- Counseling  Print production planner and Psychiatry Interest]  Stress Interventions Offered [employer], Provide Counseling Offered [employer], Provide Counseling     BSW completed a telephone outreach with patient. She states everything is going well. Patient states no resources are needed for utilities and rent but they are getting low on food. BSW will send patient some foods resources.   Advanced Directives Status:  Not addressed in this encounter.  Care Plan                 No Known Allergies  Medications Reviewed Today     Reviewed by Louann Sjogren, DPM (Physician) on 04/14/23 at 1105  Med List Status: <None>   Medication Order Taking? Sig Documenting  Provider Last Dose Status Informant  acetaminophen (TYLENOL) 325 MG tablet [id number] No Take 650 mg by mouth every 6 (six) hours as needed. [provider] 03/21/2023 Active   aspirin-acetaminophen-caffeine (EXCEDRIN MIGRAINE) 952-841-32 MG tablet [id number] No Take 1 tablet by mouth every 6 (six) hours as needed for headache. [provider] Past Week Active Self  atorvastatin (LIPITOR) 20 MG tablet [id number] No Take 1 tablet (20 mg total) by mouth daily. Danielle Jews, NP 03/21/2023 Active   Bismuth Subsalicylate (KAOPECTATE) 262 MG TABS [id number] No Take 262 mg by mouth daily as needed (upset stomach). [provider] Unknown Active Self  blood glucose meter kit and supplies [id number] No Dispense based on patient and insurance preference. Use up to four times daily as directed. (FOR ICD-10 E10.9, E11.9).  Patient not taking: Reported on 03/22/2023   Danielle Jews, NP Not Taking Active   calcium carbonate (TUMS - DOSED IN MG ELEMENTAL CALCIUM) 500 MG chewable tablet [id number] No Chew 1-2 tablets by mouth daily as needed for indigestion or heartburn.  Patient not taking: Reported on 03/22/2023   [provider] Not Taking Active Self  celecoxib (CELEBREX) 200 MG capsule [id number]  TAKE 1 CAPSULE BY MOUTH TWICE DAILY AS NEEDED Boscia, Heather E, NP  Active   cetirizine (ZYRTEC) 10 MG tablet [id number] No Take 10 mg by mouth daily as needed for allergies. [provider] Past Week Active Self  Cholecalciferol (VITAMIN D) 50 MCG (2000 UT) tablet [id number] No Take 2,000 Units by mouth daily. [provider] Past Week Active Self  fluconazole (DIFLUCAN) 100 MG tablet [id number] No Take 1 tablet (100 mg total) by mouth daily. Danielle Hurley  C, MD 03/21/2023 Active   glimepiride (AMARYL) 4 MG tablet [id number] No Take 1 tablet (4 mg total) by mouth 2 (two) times daily. Danielle Jews, NP 03/21/2023 Active   glucose blood (TRUETRACK TEST) test strip  [id number] No Bloodsugar testing QD and prn  Patient not taking: Reported on 03/22/2023   Danielle Jews, NP Not Taking Active   guaifenesin (HUMIBID Hurley) 400 MG TABS tablet [id number] No Take 400 mg by mouth daily as needed. [provider] Unknown Active Self  metformin (FORTAMET) 1000 MG (OSM) 24 hr tablet [id number] No Take 1 tablet po BID Danielle Jews, NP 03/21/2023 Active   mupirocin ointment (BACTROBAN) 2 % [id number] No Apply small amount to effected area twice daily for 2 weeks then as needed Danielle Jews, NP Unknown Active Self  oxybutynin (DITROPAN-XL) 10 MG 24 hr tablet [id number]  Take 1 tablet (10 mg total) by mouth at bedtime. Danielle Jews, NP  Active   pantoprazole (PROTONIX) 40 MG tablet [id number] No Take 1 tablet (40 mg total) by mouth daily. Danielle Jews, NP 03/21/2023 Active   rOPINIRole (REQUIP) 0.5 MG tablet [id number] No Take 1-2 tablets (0.5-1 mg total) by mouth at bedtime. Danielle Jews, NP Unknown Active   saxagliptin HCl (ONGLYZA) 5 MG TABS tablet [id number]  TAKE 1 TABLET BY MOUTH ONCE DAILY *STOP  JANUVIA* Boscia, Heather E, NP  Active   valsartan (DIOVAN) 160 MG tablet [id number]  Take 1 tablet by mouth once daily Danielle Jews, NP  Active             Patient Active Problem List   Diagnosis Date Noted   Chest pain 07/16/2022   Abnormal ECG 07/16/2022   Chronic left-sided low back pain with left-sided sciatica 07/16/2022   Restless leg syndrome 07/16/2022   Leg cramps 07/16/2022   Abnormal weight gain 07/16/2022   Type 2 diabetes mellitus with hyperglycemia, without long-term current use of insulin (HCC) 05/31/2022   Urinary tract infection with hematuria 05/31/2022   Gastroesophageal reflux disease without esophagitis 05/31/2022   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   Cancer screening 05/19/2022   Overactive bladder 11/02/2021   Hepatic cirrhosis (HCC) 10/21/2021   Immunization counseling 10/21/2021   Pulmonary nodule  08/19/2021   Body mass index 27.0-27.9, adult 08/19/2021   Need for hepatitis B vaccination 07/22/2021   Cellulitis of ear, right 07/08/2021   TB (pulmonary tuberculosis) 06/13/2021   Medication monitoring encounter 06/13/2021   Upper airway cough syndrome 05/08/2021   TB of lung w/ cavitation 05/08/2021   Hypertension associated with diabetes (HCC) 03/31/2021   Diabetes mellitus without complication (HCC) 03/31/2021   Hyperlipidemia associated with type 2 diabetes mellitus (HCC) 03/31/2021   Encounter to establish care 03/31/2021   History of active tuberculosis 03/31/2021   Wheezing 03/31/2021   Chronic hepatitis C without hepatic coma (HCC) 03/31/2021   Chronic left shoulder pain 03/31/2021   Dysuria 03/31/2021   Vaginal yeast infection 03/31/2021    Conditions to be addressed/monitored per PCP order:   food resources  There are no care plans that you recently modified to display for this patient.   Follow up:  Patient agrees to Care Plan and Follow-up.  Plan: The Managed Medicaid care management team will reach out to the patient again over the next 30 days.  Date/time of next scheduled Social Work care management/care coordination outreach:  06/24/23 Gus Puma, Kenard Gower, [facility]  Managed [facility] Social Worker [phone]

## 2023-05-28 ENCOUNTER — Other Ambulatory Visit: Payer: Medicaid Other | Admitting: Licensed Clinical Social Worker

## 2023-05-28 NOTE — Patient Instructions (Signed)
Visit Information  Ms. Ketring was given information about Medicaid Managed Care team care coordination services as a part of their [facility] Community Plan Medicaid benefit. Gerrie Lajaunie verbally consented to engagement with the [facility] Managed Care team.   If you are experiencing a medical emergency, please call 911 or report to your local emergency department or urgent care.   If you have a non-emergency medical problem during routine business hours, please contact your provider's office and ask to speak with a nurse.   For questions related to your [facility], please call: [phone] or visit the homepage here: [email or web address]  If you would like to schedule transportation through your [facility], please call the following number at least 2 days in advance of your appointment: [phone]   Rides for urgent appointments can also be made after hours by calling Member Services.  Call the Behavioral Health Crisis Line at [phone], at any time, 24 hours a day, 7 days a week. If you are in danger or need immediate medical attention call 911.  If you would like help to quit smoking, call 1-800-QUIT-NOW (806 324 4867) OR Espaol: 1-855-Djelo-Ya (7-253-664-4034) o para ms informacin haga clic aqu or Text READY to 742-595 to register via text  Following is a copy of your plan of care:  Care Plan : LCSW Plan of Care  Updates made by [name], LCSW since 05/28/2023 12:00 AM     Problem: Depression Identification (Depression)      Goal: Depressive Symptoms Identified   Note:   Priority: High  Timeframe:  Short-Range Goal Priority:  High Start Date:   04/30/23          Expected End Date:  ongoing                     Follow Up Date--06/04/23 at 9 am  - keep 90 percent of scheduled appointments -consider counseling or psychiatry -consider bumping up your  self-care  -consider creating a stronger support network   Why is this important?             Combatting depression may take some time.            If you don't feel better right away, don't give up on your treatment plan.    Current barriers:   Chronic Mental Health needs related to depression, stress and caregiver stress. Patient requires Support, Education, Resources, Referrals, Advocacy, and Care Coordination, in order to meet Unmet Mental Health Needs. Patient will implement clinical interventions discussed today to decrease symptoms of depression and increase knowledge and/or ability of: coping skills. Mental Health Concerns and Social Isolation Patient lacks knowledge of available community counseling agencies and resources.  Clinical Goal(s): verbalize understanding of plan for management of Anxiety, Depression, and Stress and demonstrate a reduction in symptoms. Patient will connect with a provider for ongoing mental health treatment, increase coping skills, healthy habits, self-management skills, and stress reduction        Patient Goals/Self-Care Activities: Over the next 120 days Attend scheduled medical appointments Utilize healthy coping skills and supportive resources discussed Contact PCP with any questions or concerns Keep 90 percent of counseling appointments Call your insurance provider for more information about your Enhanced Benefits  Check out counseling resources provided  Begin personal counseling with LCSW, to reduce and manage symptoms of Depression and Stress, until well-established with mental health provider Accept all calls from representative with [facility] in an effort  to establish ongoing mental health counseling and supportive services. Incorporate into daily practice - relaxation techniques, deep breathing exercises, and mindfulness meditation strategies. Talk about feelings with friends, family members, spiritual advisor, etc. Contact LCSW directly  [phone]), if you have questions, need assistance, or if additional social work needs are identified between now and our next scheduled telephone outreach call. Call 988 for mental health hotline/crisis line if needed (24/7 available) Try techniques to reduce symptoms of anxiety/negative thinking (deep breathing, distraction, positive self talk, etc)  - develop a personal safety plan - develop a plan to deal with triggers like holidays, anniversaries - exercise at least 2 to 3 times per week - have a plan for how to handle bad days - journal feelings and what helps to feel better or worse - spend time or talk with others at least 2 to 3 times per week - watch for early signs of feeling worse - begin personal counseling - call and visit an old friend - check out volunteer opportunities - join a support group - laugh; watch a funny movie or comedian - learn and use visualization or guided imagery - perform a random act of kindness - practice relaxation or meditation daily - start or continue a personal journal - practice positive thinking and self-talk -continue with compliance of taking medication  -identify current effective and ineffective coping strategies.  -implement positive self-talk in care to increase self-esteem, confidence and feelings of control.  -consider alternative and complementary therapy approaches such as meditation, mindfulness or yoga.  -journaling, prayer, worship services, meditation or pastoral counseling.  -increase participation in pleasurable group activities such as hobbies, singing, sports or volunteering).  -consider the use of meditative movement therapy such as tai chi, yoga or qigong.  -start a regular daily exercise program based on tolerance, ability and patient choice to support positive thinking and activity    If you are experiencing a Mental Health or Behavioral Health Crisis or need someone to talk to, please call the Suicide and Crisis  Lifeline: 988    Patient Goals: Follow up goal

## 2023-05-28 NOTE — Patient Outreach (Signed)
Medicaid Managed Care Social Work Note  05/28/2023 Name:  Danielle Hurley MRN:  [id number] DOB:  Feb 09, 1961  Danielle Hurley is an 62 y.o. year old female who is a primary patient of Danielle Hurley, [place].  The Medicaid Managed Care Coordination team was consulted for assistance with:  Mental Health Counseling and Resources  Danielle Hurley was given information about Medicaid Managed Care Coordination team services today. Danielle Hurley Patient agreed to services and verbal consent obtained.  Engaged with patient  for by telephone forinitial visit in response to referral for case management and/or care coordination services.   Assessments/Interventions:  Review of past medical history, allergies, medications, health status, including review of consultants reports, laboratory and other test data, was performed as part of comprehensive evaluation and provision of chronic care management services.  SDOH: (Social Determinant of Health) assessments and interventions performed: SDOH Interventions    Flowsheet Row Patient Outreach Telephone from 05/28/2023 in [place] POPULATION HEALTH DEPARTMENT Patient Outreach Telephone from 05/12/2023 in [place] POPULATION HEALTH DEPARTMENT Patient Outreach Telephone from 04/30/2023 in [place] POPULATION HEALTH DEPARTMENT  SDOH Interventions     Depression Interventions/Treatment  -- -- Counseling  Print production planner and Psychiatry Interest]  Stress Interventions Offered [employer], Provide Counseling Offered [employer] [facility], Provide Counseling Offered [employer] Resources, Provide Counseling       Advanced Directives Status:  See Care Plan for related entries.  Care Plan                 No Known Allergies  Medications Reviewed Today     Reviewed by Louann Sjogren, DPM (Physician) on 04/14/23 at 1105  Med List Status: <None>   Medication Order Taking? Sig Documenting Provider Last Dose Status Informant  acetaminophen  (TYLENOL) 325 MG tablet [id number] No Take 650 mg by mouth every 6 (six) hours as needed. [provider] 03/21/2023 Active   aspirin-acetaminophen-caffeine (EXCEDRIN MIGRAINE) 427-062-37 MG tablet [id number] No Take 1 tablet by mouth every 6 (six) hours as needed for headache. [provider] Past Week Active Self  atorvastatin (LIPITOR) 20 MG tablet [id number] No Take 1 tablet (20 mg total) by mouth daily. Carlean Jews, NP 03/21/2023 Active   Bismuth Subsalicylate (KAOPECTATE) 262 MG TABS [id number] No Take 262 mg by mouth daily as needed (upset stomach). [provider] Unknown Active Self  blood glucose meter kit and supplies [id number] No Dispense based on patient and insurance preference. Use up to four times daily as directed. (FOR ICD-10 E10.9, E11.9).  Patient not taking: Reported on 03/22/2023   Carlean Jews, NP Not Taking Active   calcium carbonate (TUMS - DOSED IN MG ELEMENTAL CALCIUM) 500 MG chewable tablet [id number] No Chew 1-2 tablets by mouth daily as needed for indigestion or heartburn.  Patient not taking: Reported on 03/22/2023   [provider] Not Taking Active Self  celecoxib (CELEBREX) 200 MG capsule [id number]  TAKE 1 CAPSULE BY MOUTH TWICE DAILY AS NEEDED Boscia, Heather E, NP  Active   cetirizine (ZYRTEC) 10 MG tablet [id number] No Take 10 mg by mouth daily as needed for allergies. [provider] Past Week Active Self  Cholecalciferol (VITAMIN D) 50 MCG (2000 UT) tablet [id number] No Take 2,000 Units by mouth daily. [provider] Past Week Active Self  fluconazole (DIFLUCAN) 100 MG tablet [id number] No Take 1 tablet (100 mg total) by mouth daily. Imogene Burn, MD 03/21/2023 Active   glimepiride (AMARYL) 4 MG tablet [id number] No Take 1  tablet (4 mg total) by mouth 2 (two) times daily. Carlean Jews, NP 03/21/2023 Active   glucose blood (TRUETRACK TEST) test strip [id number] No Bloodsugar testing QD and prn  Patient not  taking: Reported on 03/22/2023   Carlean Jews, NP Not Taking Active   guaifenesin (HUMIBID E) 400 MG TABS tablet [id number] No Take 400 mg by mouth daily as needed. [provider] Unknown Active Self  metformin (FORTAMET) 1000 MG (OSM) 24 hr tablet [id number] No Take 1 tablet po BID Carlean Jews, NP 03/21/2023 Active   mupirocin ointment (BACTROBAN) 2 % [id number] No Apply small amount to effected area twice daily for 2 weeks then as needed Carlean Jews, NP Unknown Active Self  oxybutynin (DITROPAN-XL) 10 MG 24 hr tablet [id number]  Take 1 tablet (10 mg total) by mouth at bedtime. Carlean Jews, NP  Active   pantoprazole (PROTONIX) 40 MG tablet [id number] No Take 1 tablet (40 mg total) by mouth daily. Carlean Jews, NP 03/21/2023 Active   rOPINIRole (REQUIP) 0.5 MG tablet [id number] No Take 1-2 tablets (0.5-1 mg total) by mouth at bedtime. Carlean Jews, NP Unknown Active   saxagliptin HCl (ONGLYZA) 5 MG TABS tablet [id number]  TAKE 1 TABLET BY MOUTH ONCE DAILY *STOP  JANUVIA* Boscia, Heather E, NP  Active   valsartan (DIOVAN) 160 MG tablet [id number]  Take 1 tablet by mouth once daily Carlean Jews, NP  Active             Patient Active Problem List   Diagnosis Date Noted   Chest pain 07/16/2022   Abnormal ECG 07/16/2022   Chronic left-sided low back pain with left-sided sciatica 07/16/2022   Restless leg syndrome 07/16/2022   Leg cramps 07/16/2022   Abnormal weight gain 07/16/2022   Type 2 diabetes mellitus with hyperglycemia, without long-term current use of insulin (HCC) 05/31/2022   Urinary tract infection with hematuria 05/31/2022   Gastroesophageal reflux disease without esophagitis 05/31/2022   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   Cancer screening 05/19/2022   Overactive bladder 11/02/2021   Hepatic cirrhosis (HCC) 10/21/2021   Immunization counseling 10/21/2021   Pulmonary nodule 08/19/2021   Body mass index 27.0-27.9, adult 08/19/2021    Need for hepatitis B vaccination 07/22/2021   Cellulitis of ear, right 07/08/2021   TB (pulmonary tuberculosis) 06/13/2021   Medication monitoring encounter 06/13/2021   Upper airway cough syndrome 05/08/2021   TB of lung w/ cavitation 05/08/2021   Hypertension associated with diabetes (HCC) 03/31/2021   Diabetes mellitus without complication (HCC) 03/31/2021   Hyperlipidemia associated with type 2 diabetes mellitus (HCC) 03/31/2021   Encounter to establish care 03/31/2021   History of active tuberculosis 03/31/2021   Wheezing 03/31/2021   Chronic hepatitis C without hepatic coma (HCC) 03/31/2021   Chronic left shoulder pain 03/31/2021   Dysuria 03/31/2021   Vaginal yeast infection 03/31/2021    Conditions to be addressed/monitored per PCP order:  Depression  Care Plan : LCSW Plan of Care  Updates made by [name], LCSW since 05/28/2023 12:00 AM     Problem: Depression Identification (Depression)      Goal: Depressive Symptoms Identified   Note:   Priority: High  Timeframe:  Short-Range Goal Priority:  High Start Date:   04/30/23          Expected End Date:  ongoing                     Follow Up  Date--06/04/23 at 9 am  - keep 90 percent of scheduled appointments -consider counseling or psychiatry -consider bumping up your self-care  -consider creating a stronger support network   Why is this important?             Combatting depression may take some time.            If you don't feel better right away, don't give up on your treatment plan.    Current barriers:   Chronic Mental Health needs related to depression, stress and caregiver stress. Patient requires Support, Education, Resources, Referrals, Advocacy, and Care Coordination, in order to meet Unmet Mental Health Needs. Patient will implement clinical interventions discussed today to decrease symptoms of depression and increase knowledge and/or ability of: coping skills. Mental Health Concerns and Social  Isolation Patient lacks knowledge of available community counseling agencies and resources.  Clinical Goal(s): verbalize understanding of plan for management of Anxiety, Depression, and Stress and demonstrate a reduction in symptoms. Patient will connect with a provider for ongoing mental health treatment, increase coping skills, healthy habits, self-management skills, and stress reduction        Clinical Interventions:  Assessed patient's previous and current treatment, coping skills, support system and barriers to care. Patient provided hx. Patient is the primary caregiver of her deaf brother who resides with her. She reports that family came from [place] 3 years ago. Patient reports 3 or 4 adult kids live nearby. Verbalization of feelings encouraged, motivational interviewing employed Emotional support provided, positive coping strategies explored. Establishing healthy boundaries emphasized and healthy self-care education provided Patient was educated on available mental health resources within their area that accept Medicaid and offer counseling and psychiatry. Email sent to patient today with available mental health resources within her area that accept Medicaid and offer the services that she is interested in. Email included instructions for scheduling at [facility] as well as some crisis support resources and [facility]'s walk in clinic hours Patient reports significant worsening anxiety and depression affecting her ability to function appropriately and carry out daily task. She reports having trouble getting out of the bed some days.  Patient receives strong support from spouse, MIL, and mentor LCSW provided education on relaxation techniques such as meditation, deep breathing, massage, grounding exercises or yoga that can activate the body's relaxation response and ease symptoms of stress and anxiety. LCSW ask that when pt is struggling with difficult emotions and racing thoughts that they start this  relaxation response process. LCSW provided extensive education on healthy coping skills for anxiety. SW used active and reflective listening, validated patient's feelings/concerns, and provided emotional support. Patient will work on implementing appropriate self-care habits into their daily routine such as: staying positive, writing a gratitude list, drinking water, staying active around the house, taking their medications as prescribed, combating negative thoughts or emotions and staying connected with their family and friends. Positive reinforcement provided for this decision to work on this. LCSW provided education on healthy sleep hygiene and what that looks like. LCSW encouraged patient to implement a night time routine into their schedule that works best for them and that they are able to maintain. Advised patient to implement deep breathing/grounding/meditation/self-care exercises into their nightly routine to combat racing thoughts at night. LCSW encouraged patient to wake up at the same time each day, make their sleeping environment comfortable, exercise when able, to limit naps and to not eat or drink anything right before bed.  Motivational Interviewing employed Depression screen reviewed  PHQ2/  PHQ9 completed or reviewed  Mindfulness or Relaxation training provided Active listening / Reflection utilized  Advance Care and HCPOA education provided Emotional Support Provided Problem Solving /Task Center strategies reviewed Provided psychoeducation for mental health needs  Provided brief CBT  Reviewed mental health medications and discussed importance of compliance:  Quality of sleep assessed & Sleep Hygiene techniques promoted  Participation in counseling encouraged  Verbalization of feelings encouraged  Suicidal Ideation/Homicidal Ideation assessed: Patient denies SI/HI  Review resources, discussed options and provided patient information about  Mental Health  Resources Inter-disciplinary care team collaboration (see longitudinal plan of care) 05/12/23- Patient reviewed list and is agreeable to referral to [facility] as she wishes to stay within the [facility] network. She reports that they received some great health related news regarding her brother which has helped them reduce traveling back and forth to those necessary medical appointments. 05/28/23- Patient states that [facility] called her and she has scheduled both her psychiatry and counseling appointment. She reports that she psychiatry appointment is with a female and she prefers females providers but desired to get the earliest appointment for medication management. Patient denies any current crises at the time. She is agreeable to [facility] LCSW contacting her on 06/04/23 to follow up.and ensure mental health support was established. Patient Goals/Self-Care Activities: Over the next 120 days Attend scheduled medical appointments Utilize healthy coping skills and supportive resources discussed Contact PCP with any questions or concerns Keep 90 percent of counseling appointments Call your insurance provider for more information about your Enhanced Benefits  Check out counseling resources provided  Begin personal counseling with LCSW, to reduce and manage symptoms of Depression and Stress, until well-established with mental health provider Accept all calls from representative with [facility] in an effort to establish ongoing mental health counseling and supportive services. Incorporate into daily practice - relaxation techniques, deep breathing exercises, and mindfulness meditation strategies. Talk about feelings with friends, family members, spiritual advisor, etc. Contact LCSW directly [phone]), if you have questions, need assistance, or if additional social work needs are identified between now and our next scheduled telephone outreach call. Call 988 for mental health hotline/crisis line if needed (24/7  available) Try techniques to reduce symptoms of anxiety/negative thinking (deep breathing, distraction, positive self talk, etc)  - develop a personal safety plan - develop a plan to deal with triggers like holidays, anniversaries - exercise at least 2 to 3 times per week - have a plan for how to handle bad days - journal feelings and what helps to feel better or worse - spend time or talk with others at least 2 to 3 times per week - watch for early signs of feeling worse - begin personal counseling - call and visit an old friend - check out volunteer opportunities - join a support group - laugh; watch a funny movie or comedian - learn and use visualization or guided imagery - perform a random act of kindness - practice relaxation or meditation daily - start or continue a personal journal - practice positive thinking and self-talk -continue with compliance of taking medication  -identify current effective and ineffective coping strategies.  -implement positive self-talk in care to increase self-esteem, confidence and feelings of control.  -consider alternative and complementary therapy approaches such as meditation, mindfulness or yoga.  -journaling, prayer, worship services, meditation or pastoral counseling.  -increase participation in pleasurable group activities such as hobbies, singing, sports or volunteering).  -consider the use of meditative movement therapy such as tai chi, yoga  or qigong.  -start a regular daily exercise program based on tolerance, ability and patient choice to support positive thinking and activity    If you are experiencing a Mental Health or Behavioral Health Crisis or need someone to talk to, please call the Suicide and Crisis Lifeline: 988    Patient Goals: Follow up goal       04/30/2023   11:24 AM 03/04/2023    2:33 PM 05/27/2022    2:43 PM 05/19/2022    8:38 AM 02/24/2022    3:06 PM  Depression screen PHQ 2/9  Decreased Interest 2 1 1 1 1   Down,  Depressed, Hopeless 2 1 1 1 1   PHQ - 2 Score 4 2 2 2 2   Altered sleeping 3 1 1 1 1   Tired, decreased energy 3 1 1  0 1  Change in appetite 2 1 1  0 0  Feeling bad or failure about yourself  2 0 1 0 0  Trouble concentrating 0 0 1 0 1  Moving slowly or fidgety/restless 0 0 0 0 0  Suicidal thoughts 0 0 0  0  PHQ-9 Score 14 5 7 3 5   Difficult doing work/chores Somewhat difficult          03/04/2023    2:33 PM 05/27/2022    2:43 PM 02/24/2022    3:06 PM 11/26/2021    2:57 PM  GAD 7 : Generalized Anxiety Score  Nervous, Anxious, on Edge 1 0 0 0  Control/stop worrying 1 1 1  0  Worry too much - different things 1 1 1  0  Trouble relaxing 0 0 0 0  Restless 0 0 0 0  Easily annoyed or irritable 1 1 1  0  Afraid - awful might happen 1 1 1  0  Total GAD 7 Score 5 4 4  0   Follow up:  Patient agrees to Care Plan and Follow-up.  Plan: The Managed Medicaid care management team will reach out to the patient again over the next 30 days.  Dickie La, BSW, MSW, [employer] Managed Medicaid LCSW [facility]  [facility] [email or web address] Phone: [phone]

## 2023-05-31 ENCOUNTER — Other Ambulatory Visit: Payer: Self-pay | Admitting: Nurse Practitioner

## 2023-05-31 ENCOUNTER — Other Ambulatory Visit: Payer: Self-pay

## 2023-05-31 DIAGNOSIS — Z0000 Encounter for general adult medical examination without abnormal findings: Secondary | ICD-10-CM

## 2023-05-31 DIAGNOSIS — G8929 Other chronic pain: Secondary | ICD-10-CM

## 2023-05-31 DIAGNOSIS — Z1321 Encounter for screening for nutritional disorder: Secondary | ICD-10-CM

## 2023-05-31 DIAGNOSIS — E1159 Type 2 diabetes mellitus with other circulatory complications: Secondary | ICD-10-CM

## 2023-05-31 DIAGNOSIS — E1165 Type 2 diabetes mellitus with hyperglycemia: Secondary | ICD-10-CM

## 2023-05-31 DIAGNOSIS — M064 Inflammatory polyarthropathy: Secondary | ICD-10-CM

## 2023-06-01 ENCOUNTER — Other Ambulatory Visit: Payer: Medicaid Other

## 2023-06-01 DIAGNOSIS — I152 Hypertension secondary to endocrine disorders: Secondary | ICD-10-CM | POA: Diagnosis not present

## 2023-06-01 DIAGNOSIS — Z130 Encounter for screening for diseases of the blood and blood-forming organs and certain disorders involving the immune mechanism: Secondary | ICD-10-CM

## 2023-06-01 DIAGNOSIS — Z13228 Encounter for screening for other metabolic disorders: Secondary | ICD-10-CM | POA: Diagnosis not present

## 2023-06-01 DIAGNOSIS — E1159 Type 2 diabetes mellitus with other circulatory complications: Secondary | ICD-10-CM | POA: Diagnosis not present

## 2023-06-01 DIAGNOSIS — Z1329 Encounter for screening for other suspected endocrine disorder: Secondary | ICD-10-CM | POA: Diagnosis not present

## 2023-06-01 DIAGNOSIS — Z0000 Encounter for general adult medical examination without abnormal findings: Secondary | ICD-10-CM | POA: Diagnosis not present

## 2023-06-01 DIAGNOSIS — E1165 Type 2 diabetes mellitus with hyperglycemia: Secondary | ICD-10-CM

## 2023-06-01 DIAGNOSIS — Z1321 Encounter for screening for nutritional disorder: Secondary | ICD-10-CM | POA: Diagnosis not present

## 2023-06-02 ENCOUNTER — Ambulatory Visit (INDEPENDENT_AMBULATORY_CARE_PROVIDER_SITE_OTHER): Payer: Medicaid Other | Admitting: Psychiatry

## 2023-06-02 VITALS — HR 82 | Ht 71.0 in | Wt 208.0 lb

## 2023-06-02 DIAGNOSIS — F329 Major depressive disorder, single episode, unspecified: Secondary | ICD-10-CM | POA: Diagnosis not present

## 2023-06-02 DIAGNOSIS — F411 Generalized anxiety disorder: Secondary | ICD-10-CM | POA: Insufficient documentation

## 2023-06-02 LAB — CBC WITH DIFFERENTIAL/PLATELET
Basophils Absolute: 0 10*3/uL (ref 0.0–0.2)
Basos: 0 %
EOS (ABSOLUTE): 0.2 10*3/uL (ref 0.0–0.4)
Eos: 3 %
Hematocrit: 37.6 % (ref 34.0–46.6)
Hemoglobin: 12.3 g/dL (ref 11.1–15.9)
Immature Grans (Abs): 0 10*3/uL (ref 0.0–0.1)
Immature Granulocytes: 0 %
Lymphocytes Absolute: 2.2 10*3/uL (ref 0.7–3.1)
Lymphs: 30 %
MCH: 26.9 pg (ref 26.6–33.0)
MCHC: 32.7 g/dL (ref 31.5–35.7)
MCV: 82 fL (ref 79–97)
Monocytes Absolute: 0.4 10*3/uL (ref 0.1–0.9)
Monocytes: 5 %
Neutrophils Absolute: 4.6 10*3/uL (ref 1.4–7.0)
Neutrophils: 62 %
Platelets: 225 10*3/uL (ref 150–450)
RBC: 4.58 x10E6/uL (ref 3.77–5.28)
RDW: 13.8 % (ref 11.7–15.4)
WBC: 7.4 10*3/uL (ref 3.4–10.8)

## 2023-06-02 LAB — LIPID PANEL
Chol/HDL Ratio: 3.4 ratio (ref 0.0–4.4)
Cholesterol, Total: 133 mg/dL (ref 100–199)
HDL: 39 mg/dL — ABNORMAL LOW (ref >39)
LDL Chol Calc (NIH): 67 mg/dL (ref 0–99)
Triglycerides: 155 mg/dL — ABNORMAL HIGH (ref 0–149)
VLDL Cholesterol Cal: 27 mg/dL (ref 5–40)

## 2023-06-02 LAB — COMPREHENSIVE METABOLIC PANEL
ALT: 9 IU/L (ref 0–32)
AST: 15 IU/L (ref 0–40)
Albumin: 4.3 g/dL (ref 3.9–4.9)
Alkaline Phosphatase: 132 IU/L — ABNORMAL HIGH (ref 44–121)
BUN/Creatinine Ratio: 20 (ref 12–28)
BUN: 24 mg/dL (ref 8–27)
Bilirubin Total: 0.5 mg/dL (ref 0.0–1.2)
CO2: 19 mmol/L — ABNORMAL LOW (ref 20–29)
Calcium: 9.5 mg/dL (ref 8.7–10.3)
Chloride: 103 mmol/L (ref 96–106)
Creatinine, Ser: 1.18 mg/dL — ABNORMAL HIGH (ref 0.57–1.00)
Globulin, Total: 3.2 g/dL (ref 1.5–4.5)
Glucose: 242 mg/dL — ABNORMAL HIGH (ref 70–99)
Potassium: 4.5 mmol/L (ref 3.5–5.2)
Sodium: 141 mmol/L (ref 134–144)
Total Protein: 7.5 g/dL (ref 6.0–8.5)
eGFR: 52 mL/min/{1.73_m2} — ABNORMAL LOW (ref >59)

## 2023-06-02 LAB — HEMOGLOBIN A1C
Est. average glucose Bld gHb Est-mCnc: 295 mg/dL
Hgb A1c MFr Bld: 11.9 % — ABNORMAL HIGH (ref 4.8–5.6)

## 2023-06-02 LAB — TSH: TSH: 2.73 u[IU]/mL (ref 0.450–4.500)

## 2023-06-02 MED ORDER — SERTRALINE HCL 50 MG PO TABS: ORAL_TABLET | ORAL | 1 refills | Status: DC

## 2023-06-02 NOTE — Patient Instructions (Signed)
Thank you for attending your appointment today.  -- START Zoloft 25 mg daily; after 1 week INCREASE to 50 mg daily -- Continue other medications as prescribed.  Please do not make any changes to medications without first discussing with your provider. If you are experiencing a psychiatric emergency, please call 911 or present to your nearest emergency department. Additional crisis, medication management, and therapy resources are included below.  [facility]  [address] [place], [place] [address] [phone] WALK-IN URGENT CARE 24/7 FOR ANYONE [address] [place], [place]  [phone] Fax: [phone] [email or web address] *Interpreters available *Accepts all insurance and uninsured for Urgent Care needs *Accepts Medicaid and uninsured for outpatient treatment (below)      ONLY FOR [facility]  Below:    Outpatient New Patient Assessment/Therapy Walk-ins:        Monday -Thursday 8am until slots are full.        Every Friday 1pm-4pm  (first come, first served)                   New Patient Psychiatry/Medication Management        Monday-Friday 8am-11am (first come, first served)               For all walk-ins we ask that you arrive by 7:15am, because patients will be seen in the order of arrival.
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
## 2023-06-03 ENCOUNTER — Encounter (HOSPITAL_COMMUNITY): Payer: Self-pay | Admitting: Psychiatry

## 2023-06-04 ENCOUNTER — Other Ambulatory Visit: Payer: Medicaid Other | Admitting: Licensed Clinical Social Worker

## 2023-06-04 NOTE — Patient Outreach (Signed)
Medicaid Managed Care Social Work Note  06/04/2023 Name:  Danielle Hurley MRN:  [id number] DOB:  1961/01/23  Danielle Hurley is an 62 y.o. year old female who is a primary patient of [name], [place].  The Medicaid Managed Care Coordination team was consulted for assistance with:  Mental Health Counseling and Resources  Ms. Losano was given information about Medicaid Managed Care Coordination team services today. [name] Patient agreed to services and verbal consent obtained.  Engaged with patient  for by telephone forfollow up visit in response to referral for case management and/or care coordination services.   Assessments/Interventions:  Review of past medical history, allergies, medications, health status, including review of consultants reports, laboratory and other test data, was performed as part of comprehensive evaluation and provision of chronic care management services.  SDOH: (Social Determinant of Health) assessments and interventions performed: SDOH Interventions    Flowsheet Row Patient Outreach Telephone from 06/04/2023 in [place] [facility]ATION [facility] DEPARTMENT Patient Outreach Telephone from 05/28/2023 in [place] POPULATION HEALTH DEPARTMENT Patient Outreach Telephone from 05/12/2023 in [place] POPULATION HEALTH DEPARTMENT Patient Outreach Telephone from 04/30/2023 in [place] POPULATION HEALTH DEPARTMENT  SDOH Interventions      Depression Interventions/Treatment  -- -- -- Counseling  Print production planner and Psychiatry Interest]  Stress Interventions Offered [employer], Provide Counseling Offered [employer] Resources, Provide Counseling Offered [facility] Resources, Provide Counseling Offered [employer] Resources, Provide Counseling       Advanced Directives Status:  See Care Plan for related entries.  Care Plan                 No Known Allergies  Medications Reviewed Today     Reviewed by Augusto Gamble, MD (Resident) on  06/02/23 at 1441  Med List Status: <None>   Medication Order Taking? Sig Documenting Provider Last Dose Status Informant  acetaminophen (TYLENOL) 325 MG tablet [id number] Yes Take 650 mg by mouth every 6 (six) hours as needed. [provider] Taking Active   aspirin-acetaminophen-caffeine (EXCEDRIN MIGRAINE) [phone] MG tablet [id number] Yes Take 1 tablet by mouth every 6 (six) hours as needed for headache. [provider] Taking Active Self  atorvastatin (LIPITOR) 20 MG tablet [id number] Yes Take 1 tablet (20 mg total) by mouth daily. Carlean Jews, NP Taking Active            Med Note Augusto Gamble   Wed Jun 02, 2023  2:37 PM) Patient only taking half  Bismuth Subsalicylate (KAOPECTATE) 262 MG TABS [id number] Yes Take 262 mg by mouth daily as needed (upset stomach). [provider] Taking Active Self  blood glucose meter kit and supplies [id number] No Dispense based on patient and insurance preference. Use up to four times daily as directed. (FOR ICD-10 E10.9, E11.9).  Patient not taking: Reported on 03/22/2023   Carlean Jews, NP Not Taking Active   calcium carbonate (TUMS - DOSED IN MG ELEMENTAL CALCIUM) 500 MG chewable tablet [id number] No Chew 1-2 tablets by mouth daily as needed for indigestion or heartburn.  Patient not taking: Reported on 03/22/2023   [provider] Not Taking Active Self  celecoxib (CELEBREX) 200 MG capsule [id number] Yes TAKE 1 CAPSULE BY MOUTH TWICE DAILY AS NEEDED Saralyn Pilar A, PA Taking Active   cetirizine (ZYRTEC) 10 MG tablet [id number] Yes Take 10 mg by mouth daily as needed for allergies. [provider] Taking Active Self  Cholecalciferol (VITAMIN D) 50 MCG (2000 UT) tablet [id number] Yes Take 2,000  Units by mouth daily. [provider] Taking Active Self  fluconazole (DIFLUCAN) 100 MG tablet [id number] Yes Take 1 tablet (100 mg total) by mouth daily. Imogene Burn, MD Taking Active            Med Note  Augusto Gamble   Wed Jun 02, 2023  2:38 PM) Leanora Ivanoff for yeast infection  glimepiride (AMARYL) 4 MG tablet [id number] Yes Take 1 tablet (4 mg total) by mouth 2 (two) times daily. Carlean Jews, NP Taking Active   glucose blood (TRUETRACK TEST) test strip [id number] No Bloodsugar testing QD and prn  Patient not taking: Reported on 03/22/2023   Carlean Jews, NP Not Taking Active   guaifenesin (HUMIBID E) 400 MG TABS tablet [id number] Yes Take 400 mg by mouth daily as needed. [provider] Taking Active Self  metFORMIN (GLUCOPHAGE-XR) 500 MG 24 hr tablet [id number] Yes Take 1 tablet (500 mg total) by mouth 2 (two) times daily with a meal. Boscia, Heather E, NP Taking Active   mupirocin ointment (BACTROBAN) 2 % [id number] Yes Apply small amount to effected area twice daily for 2 weeks then as needed Carlean Jews, NP Taking Active Self  oxybutynin (DITROPAN-XL) 10 MG 24 hr tablet [id number] Yes TAKE 1 TABLET BY MOUTH AT BEDTIME Boscia, Heather E, NP Taking Active   pantoprazole (PROTONIX) 40 MG tablet [id number] Yes Take 1 tablet (40 mg total) by mouth daily. Carlean Jews, NP Taking Active   rOPINIRole (REQUIP) 0.5 MG tablet [id number] No Take 1-2 tablets (0.5-1 mg total) by mouth at bedtime.  Patient not taking: Reported on 06/02/2023   Carlean Jews, NP Not Taking Active   saxagliptin HCl (ONGLYZA) 5 MG TABS tablet [id number] Yes TAKE 1 TABLET BY MOUTH ONCE DAILY STOP  JANUVIA Boscia, Heather E, NP Taking Active   valsartan (DIOVAN) 160 MG tablet [id number] Yes Take 1 tablet by mouth once daily Carlean Jews, NP Taking Active             Patient Active Problem List   Diagnosis Date Noted   MDD (major depressive disorder) 06/02/2023   Generalized anxiety disorder 06/02/2023   Chest pain 07/16/2022   Abnormal ECG 07/16/2022   Chronic left-sided low back pain with left-sided sciatica 07/16/2022   Restless leg syndrome 07/16/2022   Leg cramps 07/16/2022   Abnormal weight  gain 07/16/2022   Type 2 diabetes mellitus with hyperglycemia, without long-term current use of insulin (HCC) 05/31/2022   Urinary tract infection with hematuria 05/31/2022   Gastroesophageal reflux disease without esophagitis 05/31/2022   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   Cancer screening 05/19/2022   Overactive bladder 11/02/2021   Hepatic cirrhosis (HCC) 10/21/2021   Immunization counseling 10/21/2021   Pulmonary nodule 08/19/2021   Body mass index 27.0-27.9, adult 08/19/2021   Need for hepatitis B vaccination 07/22/2021   Cellulitis of ear, right 07/08/2021   TB (pulmonary tuberculosis) 06/13/2021   Medication monitoring encounter 06/13/2021   Upper airway cough syndrome 05/08/2021   TB of lung w/ cavitation 05/08/2021   Hypertension associated with diabetes (HCC) 03/31/2021   Diabetes mellitus without complication (HCC) 03/31/2021   Hyperlipidemia associated with type 2 diabetes mellitus (HCC) 03/31/2021   Encounter to establish care 03/31/2021   History of active tuberculosis 03/31/2021   Wheezing 03/31/2021   Chronic hepatitis C without hepatic coma (HCC) 03/31/2021   Chronic left shoulder pain 03/31/2021   Dysuria 03/31/2021   Vaginal yeast infection 03/31/2021    Conditions to  be addressed/monitored per PCP order:  Depression  Care Plan : LCSW Plan of Care  Updates made by [name], LCSW since 06/04/2023 12:00 AM     Problem: Depression Identification (Depression)      Goal: Depressive Symptoms Identified   Note:   Priority: High  Timeframe:  Short-Range Goal Priority:  High Start Date:   04/30/23          Expected End Date:  ongoing                     Follow Up Date--06/18/23 at 9:15am  - keep 90 percent of scheduled appointments -consider counseling or psychiatry -consider bumping up your self-care  -consider creating a stronger support network   Why is this important?             Combatting depression may take some time.             If you don't feel better right away, don't give up on your treatment plan.    Current barriers:   Chronic Mental Health needs related to depression, stress and caregiver stress. Patient requires Support, Education, Resources, Referrals, Advocacy, and Care Coordination, in order to meet Unmet Mental Health Needs. Patient will implement clinical interventions discussed today to decrease symptoms of depression and increase knowledge and/or ability of: coping skills. Mental Health Concerns and Social Isolation Patient lacks knowledge of available community counseling agencies and resources.  Clinical Goal(s): verbalize understanding of plan for management of Anxiety, Depression, and Stress and demonstrate a reduction in symptoms. Patient will connect with a provider for ongoing mental health treatment, increase coping skills, healthy habits, self-management skills, and stress reduction        Clinical Interventions:  Assessed patient's previous and current treatment, coping skills, support system and barriers to care. Patient provided hx. Patient is the primary caregiver of her deaf brother who resides with her. She reports that family came from [place] 3 years ago. Patient reports 3 or 4 adult kids live nearby. Verbalization of feelings encouraged, motivational interviewing employed Emotional support provided, positive coping strategies explored. Establishing healthy boundaries emphasized and healthy self-care education provided Patient was educated on available mental health resources within their area that accept Medicaid and offer counseling and psychiatry. Email sent to patient today with available mental health resources within her area that accept Medicaid and offer the services that she is interested in. Email included instructions for scheduling at [facility] as well as some crisis support resources and [facility]'s walk in clinic hours Patient reports significant worsening anxiety and depression  affecting her ability to function appropriately and carry out daily task. She reports having trouble getting out of the bed some days.  Patient receives strong support from spouse, MIL, and mentor LCSW provided education on relaxation techniques such as meditation, deep breathing, massage, grounding exercises or yoga that can activate the body's relaxation response and ease symptoms of stress and anxiety. LCSW ask that when pt is struggling with difficult emotions and racing thoughts that they start this relaxation response process. LCSW provided extensive education on healthy coping skills for anxiety. SW used active and reflective listening, validated patient's feelings/concerns, and provided emotional support. Patient will work on implementing appropriate self-care habits into their daily routine such as: staying positive, writing a gratitude list, drinking water, staying active around the house, taking their medications as prescribed, combating negative thoughts or emotions and staying connected with their family and friends. Positive reinforcement provided for this decision  to work on this. LCSW provided education on healthy sleep hygiene and what that looks like. LCSW encouraged patient to implement a night time routine into their schedule that works best for them and that they are able to maintain. Advised patient to implement deep breathing/grounding/meditation/self-care exercises into their nightly routine to combat racing thoughts at night. LCSW encouraged patient to wake up at the same time each day, make their sleeping environment comfortable, exercise when able, to limit naps and to not eat or drink anything right before bed.  Motivational Interviewing employed Depression screen reviewed  PHQ2/ PHQ9 completed or reviewed  Mindfulness or Relaxation training provided Active listening / Reflection utilized  Advance Care and HCPOA education provided Emotional Support Provided Problem Solving  /Task Center strategies reviewed Provided psychoeducation for mental health needs  Provided brief CBT  Reviewed mental health medications and discussed importance of compliance:  Quality of sleep assessed & Sleep Hygiene techniques promoted  Participation in counseling encouraged  Verbalization of feelings encouraged  Suicidal Ideation/Homicidal Ideation assessed: Patient denies SI/HI  Review resources, discussed options and provided patient information about  Mental Health Resources Inter-disciplinary care team collaboration (see longitudinal plan of care) 05/12/23- Patient reviewed list and is agreeable to referral to [facility] as she wishes to stay within the [facility] network. She reports that they received some great health related news regarding her brother which has helped them reduce traveling back and forth to those necessary medical appointments. 05/28/23- Patient states that [facility] called her and she has scheduled both her psychiatry and counseling appointment. She reports that she psychiatry appointment is with a female and she prefers females providers but desired to get the earliest appointment for medication management. Patient denies any current crises at the time. She is agreeable to [facility] LCSW contacting her on 06/04/23 to follow up.and ensure mental health support was established. Update- Patient recently completed her initial psychiatry visit on 06/02/23. She reports that she was prescribed zoloft but she and her daughter as now hesitant about her taking this new medication. Patient as advised to contact [facility] and PCP office for further information/direction on new psychiatric medication. Patient was appreciative of call and support. Patient will start counseling at [facility] next month.  Patient Goals/Self-Care Activities: Over the next 120 days Attend scheduled medical appointments Utilize healthy coping skills and supportive resources discussed Contact PCP with any questions or concerns Keep 90  percent of counseling appointments Call your insurance provider for more information about your Enhanced Benefits  Check out counseling resources provided  Begin personal counseling with LCSW, to reduce and manage symptoms of Depression and Stress, until well-established with mental health provider Accept all calls from representative with [facility] in an effort to establish ongoing mental health counseling and supportive services. Incorporate into daily practice - relaxation techniques, deep breathing exercises, and mindfulness meditation strategies. Talk about feelings with friends, family members, spiritual advisor, etc. Contact LCSW directly [phone]), if you have questions, need assistance, or if additional social work needs are identified between now and our next scheduled telephone outreach call. Call 988 for mental health hotline/crisis line if needed (24/7 available) Try techniques to reduce symptoms of anxiety/negative thinking (deep breathing, distraction, positive self talk, etc)  - develop a personal safety plan - develop a plan to deal with triggers like holidays, anniversaries - exercise at least 2 to 3 times per week - have a plan for how to handle bad days - journal feelings and what helps to feel better or worse -  spend time or talk with others at least 2 to 3 times per week - watch for early signs of feeling worse - begin personal counseling - call and visit an old friend - check out volunteer opportunities - join a support group - laugh; watch a funny movie or comedian - learn and use visualization or guided imagery - perform a random act of kindness - practice relaxation or meditation daily - start or continue a personal journal - practice positive thinking and self-talk -continue with compliance of taking medication  -identify current effective and ineffective coping strategies.  -implement positive self-talk in care to increase self-esteem, confidence and  feelings of control.  -consider alternative and complementary therapy approaches such as meditation, mindfulness or yoga.  -journaling, prayer, worship services, meditation or pastoral counseling.  -increase participation in pleasurable group activities such as hobbies, singing, sports or volunteering).  -consider the use of meditative movement therapy such as tai chi, yoga or qigong.  -start a regular daily exercise program based on tolerance, ability and patient choice to support positive thinking and activity    If you are experiencing a Mental Health or Behavioral Health Crisis or need someone to talk to, please call the Suicide and Crisis Lifeline: 988    Patient Goals: Follow up goal     Follow up:  Patient agrees to Care Plan and Follow-up.  Plan: The Managed Medicaid care management team will reach out to the patient again over the next 30 days.  Dickie La, BSW, MSW, [employer] Managed Medicaid LCSW [facility]  [facility] [email or web address] Phone: [phone]

## 2023-06-04 NOTE — Patient Instructions (Signed)
Visit Information  Danielle Hurley was given information about Medicaid Managed Care team care coordination services as a part of their [facility] Medicaid benefit. Danielle Hurley verbally consented to engagement with the [facility] Managed Care team.   If you are experiencing a medical emergency, please call 911 or report to your local emergency department or urgent care.   If you have a non-emergency medical problem during routine business hours, please contact your provider's office and ask to speak with a nurse.   For questions related to your [facility], please call: [phone] or visit the homepage here: [email or web address]  If you would like to schedule transportation through your [facility], please call the following number at least 2 days in advance of your appointment: [phone]   Rides for urgent appointments can also be made after hours by calling Member Services.  Call the Behavioral Health Crisis Line at ([phone], at any time, 24 hours a day, 7 days a week. If you are in danger or need immediate medical attention call 911.  If you would like help to quit smoking, call 1-800-QUIT-NOW (607-302-4163) OR Espaol: 1-855-Djelo-Ya (1-324-401-0272) o para ms informacin haga clic aqu or Text READY to 536-644 to register via text   Following is a copy of your plan of care:  Care Plan : LCSW Plan of Care  Updates made by [name], LCSW since 06/04/2023 12:00 AM     Problem: Depression Identification (Depression)      Goal: Depressive Symptoms Identified   Note:   Priority: High  Timeframe:  Short-Range Goal Priority:  High Start Date:   04/30/23          Expected End Date:  ongoing                     Follow Up Date--06/18/23 at 9:15am  - keep 90 percent of scheduled appointments -consider counseling or psychiatry -consider bumping up  your self-care  -consider creating a stronger support network   Why is this important?             Combatting depression may take some time.            If you don't feel better right away, don't give up on your treatment plan.    Current barriers:   Chronic Mental Health needs related to depression, stress and caregiver stress. Patient requires Support, Education, Resources, Referrals, Advocacy, and Care Coordination, in order to meet Unmet Mental Health Needs. Patient will implement clinical interventions discussed today to decrease symptoms of depression and increase knowledge and/or ability of: coping skills. Mental Health Concerns and Social Isolation Patient lacks knowledge of available community counseling agencies and resources.  Clinical Goal(s): verbalize understanding of plan for management of Anxiety, Depression, and Stress and demonstrate a reduction in symptoms. Patient will connect with a provider for ongoing mental health treatment, increase coping skills, healthy habits, self-management skills, and stress reduction        Patient Goals/Self-Care Activities: Over the next 120 days Attend scheduled medical appointments Utilize healthy coping skills and supportive resources discussed Contact PCP with any questions or concerns Keep 90 percent of counseling appointments Call your insurance provider for more information about your Enhanced Benefits  Check out counseling resources provided  Begin personal counseling with LCSW, to reduce and manage symptoms of Depression and Stress, until well-established with mental health provider Accept all calls from representative with [facility] in an effort  to establish ongoing mental health counseling and supportive services. Incorporate into daily practice - relaxation techniques, deep breathing exercises, and mindfulness meditation strategies. Talk about feelings with friends, family members, spiritual advisor, etc. Contact LCSW directly  [phone]), if you have questions, need assistance, or if additional social work needs are identified between now and our next scheduled telephone outreach call. Call 988 for mental health hotline/crisis line if needed (24/7 available) Try techniques to reduce symptoms of anxiety/negative thinking (deep breathing, distraction, positive self talk, etc)  - develop a personal safety plan - develop a plan to deal with triggers like holidays, anniversaries - exercise at least 2 to 3 times per week - have a plan for how to handle bad days - journal feelings and what helps to feel better or worse - spend time or talk with others at least 2 to 3 times per week - watch for early signs of feeling worse - begin personal counseling - call and visit an old friend - check out volunteer opportunities - join a support group - laugh; watch a funny movie or comedian - learn and use visualization or guided imagery - perform a random act of kindness - practice relaxation or meditation daily - start or continue a personal journal - practice positive thinking and self-talk -continue with compliance of taking medication  -identify current effective and ineffective coping strategies.  -implement positive self-talk in care to increase self-esteem, confidence and feelings of control.  -consider alternative and complementary therapy approaches such as meditation, mindfulness or yoga.  -journaling, prayer, worship services, meditation or pastoral counseling.  -increase participation in pleasurable group activities such as hobbies, singing, sports or volunteering).  -consider the use of meditative movement therapy such as tai chi, yoga or qigong.  -start a regular daily exercise program based on tolerance, ability and patient choice to support positive thinking and activity    If you are experiencing a Mental Health or Behavioral Health Crisis or need someone to talk to, please call the Suicide and Crisis  Lifeline: 988    Patient Goals: Follow up goal

## 2023-06-07 ENCOUNTER — Ambulatory Visit: Payer: Medicaid Other | Admitting: Nurse Practitioner

## 2023-06-09 ENCOUNTER — Ambulatory Visit: Payer: Medicaid Other | Admitting: Family Medicine

## 2023-06-18 ENCOUNTER — Other Ambulatory Visit: Payer: Medicaid Other | Admitting: Licensed Clinical Social Worker

## 2023-06-18 NOTE — Patient Instructions (Signed)
Visit Information  Ms. Bilello was given information about Medicaid Managed Care team care coordination services as a part of their [facility] Community Plan Medicaid benefit. Orvetta Danielski verbally consented to engagement with the [facility] Managed Care team.   If you are experiencing a medical emergency, please call 911 or report to your local emergency department or urgent care.   If you have a non-emergency medical problem during routine business hours, please contact your provider's office and ask to speak with a nurse.   For questions related to your [facility], please call: [phone] or visit the homepage here: [email or web address]  If you would like to schedule transportation through your [facility], please call the following number at least 2 days in advance of your appointment: [phone]   Rides for urgent appointments can also be made after hours by calling Member Services.  Call the Behavioral Health Crisis Line at [phone], at any time, 24 hours a day, 7 days a week. If you are in danger or need immediate medical attention call 911.  If you would like help to quit smoking, call 1-800-QUIT-NOW (850 816 6697) OR Espaol: 1-855-Djelo-Ya (7-253-664-4034) o para ms informacin haga clic aqu or Text READY to 742-595 to register via text  Following is a copy of your plan of care:  Care Plan : LCSW Plan of Care  Updates made by [name], LCSW since 06/18/2023 12:00 AM     Problem: Depression Identification (Depression)      Goal: Depressive Symptoms Identified   Note:   Priority: High  Timeframe:  Short-Range Goal Priority:  High Start Date:   04/30/23          Expected End Date:  ongoing                     Follow Up Date--07/09/23 at 9:15am  - keep 90 percent of scheduled appointments -consider counseling or psychiatry -consider bumping up your  self-care  -consider creating a stronger support network   Why is this important?             Combatting depression may take some time.            If you don't feel better right away, don't give up on your treatment plan.    Current barriers:   Chronic Mental Health needs related to depression, stress and caregiver stress. Patient requires Support, Education, Resources, Referrals, Advocacy, and Care Coordination, in order to meet Unmet Mental Health Needs. Patient will implement clinical interventions discussed today to decrease symptoms of depression and increase knowledge and/or ability of: coping skills. Mental Health Concerns and Social Isolation Patient lacks knowledge of available community counseling agencies and resources.  Clinical Goal(s): verbalize understanding of plan for management of Anxiety, Depression, and Stress and demonstrate a reduction in symptoms. Patient will connect with a provider for ongoing mental health treatment, increase coping skills, healthy habits, self-management skills, and stress reduction        Patient Goals/Self-Care Activities: Over the next 120 days Attend scheduled medical appointments Utilize healthy coping skills and supportive resources discussed Contact PCP with any questions or concerns Keep 90 percent of counseling appointments Call your insurance provider for more information about your Enhanced Benefits  Check out counseling resources provided  Begin personal counseling with LCSW, to reduce and manage symptoms of Depression and Stress, until well-established with mental health provider Accept all calls from representative with [facility] in an effort to  establish ongoing mental health counseling and supportive services. Incorporate into daily practice - relaxation techniques, deep breathing exercises, and mindfulness meditation strategies. Talk about feelings with friends, family members, spiritual advisor, etc. Contact LCSW directly  [phone]), if you have questions, need assistance, or if additional social work needs are identified between now and our next scheduled telephone outreach call. Call 988 for mental health hotline/crisis line if needed (24/7 available) Try techniques to reduce symptoms of anxiety/negative thinking (deep breathing, distraction, positive self talk, etc)  - develop a personal safety plan - develop a plan to deal with triggers like holidays, anniversaries - exercise at least 2 to 3 times per week - have a plan for how to handle bad days - journal feelings and what helps to feel better or worse - spend time or talk with others at least 2 to 3 times per week - watch for early signs of feeling worse - begin personal counseling - call and visit an old friend - check out volunteer opportunities - join a support group - laugh; watch a funny movie or comedian - learn and use visualization or guided imagery - perform a random act of kindness - practice relaxation or meditation daily - start or continue a personal journal - practice positive thinking and self-talk -continue with compliance of taking medication  -identify current effective and ineffective coping strategies.  -implement positive self-talk in care to increase self-esteem, confidence and feelings of control.  -consider alternative and complementary therapy approaches such as meditation, mindfulness or yoga.  -journaling, prayer, worship services, meditation or pastoral counseling.  -increase participation in pleasurable group activities such as hobbies, singing, sports or volunteering).  -consider the use of meditative movement therapy such as tai chi, yoga or qigong.  -start a regular daily exercise program based on tolerance, ability and patient choice to support positive thinking and activity    If you are experiencing a Mental Health or Behavioral Health Crisis or need someone to talk to, please call the Suicide and Crisis  Lifeline: 988    Patient Goals: Follow up goal  Dickie La, BSW, MSW, [employer] Managed Medicaid LCSW [facility]  [facility] [email or web address] Phone: [phone]

## 2023-06-18 NOTE — Patient Outreach (Signed)
Medicaid Managed Care Social Work Note  06/18/2023 Name:  Danielle Hurley MRN:  [id number] DOB:  Sep 03, 1961  Danielle Hurley is an 62 y.o. year old female who is a primary patient of [name], [place].  The Medicaid Managed Care Coordination team was consulted for assistance with:  Mental Health Counseling and Resources  Ms. Briley was given information about Medicaid Managed Care Coordination team services today. Danielle Hurley Patient agreed to services and verbal consent obtained.  Engaged with patient  for by telephone forfollow up visit in response to referral for case management and/or care coordination services.   Assessments/Interventions:  Review of past medical history, allergies, medications, health status, including review of consultants reports, laboratory and other test data, was performed as part of comprehensive evaluation and provision of chronic care management services.  SDOH: (Social Determinant of Health) assessments and interventions performed: SDOH Interventions    Flowsheet Row Patient Outreach Telephone from 06/18/2023 in [place] POPULATION HEALTH DEPARTMENT Patient Outreach Telephone from 06/04/2023 in [place] POPULATION HEALTH DEPARTMENT Patient Outreach Telephone from 05/28/2023 in [place] POPULATION HEALTH DEPARTMENT Patient Outreach Telephone from 05/12/2023 in [place] POPULATION HEALTH DEPARTMENT Patient Outreach Telephone from 04/30/2023 in [place] POPULATION HEALTH DEPARTMENT  SDOH Interventions       Depression Interventions/Treatment  -- -- -- -- Counseling  Print production planner and Psychiatry Interest]  Stress Interventions Offered [employer], Provide Counseling Offered [employer] Resources, Provide Counseling Offered Community Wellness Resources, Provide Counseling Offered Community Wellness Resources, Provide Counseling Offered [employer] Resources, Provide Counseling       Advanced Directives Status:  See Care Plan for  related entries.  Care Plan                 No Known Allergies  Medications Reviewed Today     Reviewed by Augusto Gamble, MD (Resident) on 06/02/23 at 1441  Med List Status: <None>   Medication Order Taking? Sig Documenting Provider Last Dose Status Informant  acetaminophen (TYLENOL) 325 MG tablet [id number] Yes Take 650 mg by mouth every 6 (six) hours as needed. [provider] Taking Active   aspirin-acetaminophen-caffeine (EXCEDRIN MIGRAINE) [id number] MG tablet [id number] Yes Take 1 tablet by mouth every 6 (six) hours as needed for headache. [provider] Taking Active Self  atorvastatin (LIPITOR) 20 MG tablet [id number] Yes Take 1 tablet (20 mg total) by mouth daily. Carlean Jews, NP Taking Active            Med Note Augusto Gamble   Wed Jun 02, 2023  2:37 PM) Patient only taking half  Bismuth Subsalicylate (KAOPECTATE) 262 MG TABS [id number] Yes Take 262 mg by mouth daily as needed (upset stomach). [provider] Taking Active Self  blood glucose meter kit and supplies [id number] No Dispense based on patient and insurance preference. Use up to four times daily as directed. (FOR ICD-10 E10.9, E11.9).  Patient not taking: Reported on 03/22/2023   Carlean Jews, NP Not Taking Active   calcium carbonate (TUMS - DOSED IN MG ELEMENTAL CALCIUM) 500 MG chewable tablet [id number] No Chew 1-2 tablets by mouth daily as needed for indigestion or heartburn.  Patient not taking: Reported on 03/22/2023   [provider] Not Taking Active Self  celecoxib (CELEBREX) 200 MG capsule [id number] Yes TAKE 1 CAPSULE BY MOUTH TWICE DAILY AS NEEDED Saralyn Pilar A, PA Taking Active   cetirizine (ZYRTEC) 10 MG tablet [id number] Yes Take 10 mg by mouth daily as needed for allergies.  [provider] Taking Active Self  Cholecalciferol (VITAMIN D) 50 MCG (2000 UT) tablet [id number] Yes Take 2,000 Units by mouth daily. [provider] Taking Active Self   fluconazole (DIFLUCAN) 100 MG tablet [id number] Yes Take 1 tablet (100 mg total) by mouth daily. Imogene Burn, MD Taking Active            Med Note Augusto Gamble   Wed Jun 02, 2023  2:38 PM) Leanora Ivanoff for yeast infection  glimepiride (AMARYL) 4 MG tablet [id number] Yes Take 1 tablet (4 mg total) by mouth 2 (two) times daily. Carlean Jews, NP Taking Active   glucose blood (TRUETRACK TEST) test strip [id number] No Bloodsugar testing QD and prn  Patient not taking: Reported on 03/22/2023   Carlean Jews, NP Not Taking Active   guaifenesin (HUMIBID E) 400 MG TABS tablet [id number] Yes Take 400 mg by mouth daily as needed. [provider] Taking Active Self  metFORMIN (GLUCOPHAGE-XR) 500 MG 24 hr tablet [id number] Yes Take 1 tablet (500 mg total) by mouth 2 (two) times daily with a meal. Boscia, Heather E, NP Taking Active   mupirocin ointment (BACTROBAN) 2 % [id number] Yes Apply small amount to effected area twice daily for 2 weeks then as needed Carlean Jews, NP Taking Active Self  oxybutynin (DITROPAN-XL) 10 MG 24 hr tablet [id number] Yes TAKE 1 TABLET BY MOUTH AT BEDTIME Boscia, Heather E, NP Taking Active   pantoprazole (PROTONIX) 40 MG tablet [id number] Yes Take 1 tablet (40 mg total) by mouth daily. Carlean Jews, NP Taking Active   rOPINIRole (REQUIP) 0.5 MG tablet [id number] No Take 1-2 tablets (0.5-1 mg total) by mouth at bedtime.  Patient not taking: Reported on 06/02/2023   Carlean Jews, NP Not Taking Active   saxagliptin HCl (ONGLYZA) 5 MG TABS tablet [id number] Yes TAKE 1 TABLET BY MOUTH ONCE DAILY STOP  JANUVIA Boscia, Heather E, NP Taking Active   valsartan (DIOVAN) 160 MG tablet [id number] Yes Take 1 tablet by mouth once daily Carlean Jews, NP Taking Active             Patient Active Problem List   Diagnosis Date Noted   MDD (major depressive disorder) 06/02/2023   Generalized anxiety disorder 06/02/2023   Chest pain 07/16/2022   Abnormal ECG 07/16/2022    Chronic left-sided low back pain with left-sided sciatica 07/16/2022   Restless leg syndrome 07/16/2022   Leg cramps 07/16/2022   Abnormal weight gain 07/16/2022   Type 2 diabetes mellitus with hyperglycemia, without long-term current use of insulin (HCC) 05/31/2022   Urinary tract infection with hematuria 05/31/2022   Gastroesophageal reflux disease without esophagitis 05/31/2022   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   Cancer screening 05/19/2022   Overactive bladder 11/02/2021   Hepatic cirrhosis (HCC) 10/21/2021   Immunization counseling 10/21/2021   Pulmonary nodule 08/19/2021   Body mass index 27.0-27.9, adult 08/19/2021   Need for hepatitis B vaccination 07/22/2021   Cellulitis of ear, right 07/08/2021   TB (pulmonary tuberculosis) 06/13/2021   Medication monitoring encounter 06/13/2021   Upper airway cough syndrome 05/08/2021   TB of lung w/ cavitation 05/08/2021   Hypertension associated with diabetes (HCC) 03/31/2021   Diabetes mellitus without complication (HCC) 03/31/2021   Hyperlipidemia associated with type 2 diabetes mellitus (HCC) 03/31/2021   Encounter to establish care 03/31/2021   History of active tuberculosis 03/31/2021   Wheezing 03/31/2021   Chronic hepatitis C without hepatic coma (HCC) 03/31/2021   Chronic  left shoulder pain 03/31/2021   Dysuria 03/31/2021   Vaginal yeast infection 03/31/2021    Conditions to be addressed/monitored per PCP order:  Depression  Care Plan : LCSW Plan of Care  Updates made by Danielle Bryant, LCSW since 06/18/2023 12:00 AM     Problem: Depression Identification (Depression)      Goal: Depressive Symptoms Identified   Note:   Priority: High  Timeframe:  Short-Range Goal Priority:  High Start Date:   04/30/23          Expected End Date:  ongoing                     Follow Up Date--07/09/23 at 9:15am  - keep 90 percent of scheduled appointments -consider counseling or psychiatry -consider bumping up your  self-care  -consider creating a stronger support network   Why is this important?             Combatting depression may take some time.            If you don't feel better right away, don't give up on your treatment plan.    Current barriers:   Chronic Mental Health needs related to depression, stress and caregiver stress. Patient requires Support, Education, Resources, Referrals, Advocacy, and Care Coordination, in order to meet Unmet Mental Health Needs. Patient will implement clinical interventions discussed today to decrease symptoms of depression and increase knowledge and/or ability of: coping skills. Mental Health Concerns and Social Isolation Patient lacks knowledge of available community counseling agencies and resources.  Clinical Goal(s): verbalize understanding of plan for management of Anxiety, Depression, and Stress and demonstrate a reduction in symptoms. Patient will connect with a provider for ongoing mental health treatment, increase coping skills, healthy habits, self-management skills, and stress reduction        Clinical Interventions:  Assessed patient's previous and current treatment, coping skills, support system and barriers to care. Patient provided hx. Patient is the primary caregiver of her deaf brother who resides with her. She reports that family came from [place] 3 years ago. Patient reports 3 or 4 adult kids live nearby. Verbalization of feelings encouraged, motivational interviewing employed Emotional support provided, positive coping strategies explored. Establishing healthy boundaries emphasized and healthy self-care education provided Patient was educated on available mental health resources within their area that accept Medicaid and offer counseling and psychiatry. Email sent to patient today with available mental health resources within her area that accept Medicaid and offer the services that she is interested in. Email included instructions for  scheduling at [facility] as well as some crisis support resources and [facility]'s walk in clinic hours Patient reports significant worsening anxiety and depression affecting her ability to function appropriately and carry out daily task. She reports having trouble getting out of the bed some days.  Patient receives strong support from spouse, MIL, and mentor LCSW provided education on relaxation techniques such as meditation, deep breathing, massage, grounding exercises or yoga that can activate the body's relaxation response and ease symptoms of stress and anxiety. LCSW ask that when pt is struggling with difficult emotions and racing thoughts that they start this relaxation response process. LCSW provided extensive education on healthy coping skills for anxiety. SW used active and reflective listening, validated patient's feelings/concerns, and provided emotional support. Patient will work on implementing appropriate self-care habits into their daily routine such as: staying positive, writing a gratitude list, drinking water, staying active around the house, taking their medications as prescribed,  combating negative thoughts or emotions and staying connected with their family and friends. Positive reinforcement provided for this decision to work on this. LCSW provided education on healthy sleep hygiene and what that looks like. LCSW encouraged patient to implement a night time routine into their schedule that works best for them and that they are able to maintain. Advised patient to implement deep breathing/grounding/meditation/self-care exercises into their nightly routine to combat racing thoughts at night. LCSW encouraged patient to wake up at the same time each day, make their sleeping environment comfortable, exercise when able, to limit naps and to not eat or drink anything right before bed.  Motivational Interviewing employed Depression screen reviewed  PHQ2/ PHQ9 completed or reviewed  Mindfulness or  Relaxation training provided Active listening / Reflection utilized  Advance Care and HCPOA education provided Emotional Support Provided Problem Solving /Task Center strategies reviewed Provided psychoeducation for mental health needs  Provided brief CBT  Reviewed mental health medications and discussed importance of compliance:  Quality of sleep assessed & Sleep Hygiene techniques promoted  Participation in counseling encouraged  Verbalization of feelings encouraged  Suicidal Ideation/Homicidal Ideation assessed: Patient denies SI/HI  Review resources, discussed options and provided patient information about  Mental Health Resources Inter-disciplinary care team collaboration (see longitudinal plan of care) 05/12/23- Patient reviewed list and is agreeable to referral to [facility] as she wishes to stay within the [facility] network. She reports that they received some great health related news regarding her brother which has helped them reduce traveling back and forth to those necessary medical appointments. 05/28/23- Patient states that [facility] called her and she has scheduled both her psychiatry and counseling appointment. She reports that she psychiatry appointment is with a female and she prefers females providers but desired to get the earliest appointment for medication management. Patient denies any current crises at the time. She is agreeable to [facility] LCSW contacting her on 06/04/23 to follow up.and ensure mental health support was established. Update- Patient recently completed her initial psychiatry visit on 06/02/23. She reports that she was prescribed zoloft but she and her daughter as now hesitant about her taking this new medication. Patient as advised to contact [facility] and PCP office for further information/direction on new psychiatric medication. Patient was appreciative of call and support. Patient will start counseling at [facility] next month. Update- Patient reports that she has decided to discontinue  zoloft because she did research on medication side effects and read that she is unable to take tylenol with medication. [facility] LCSW strongly encouraged her to contact her provider at [facility] to follow up on this and to contact her prescriber for questions/concerns before she changes her psychiatric medications next time she decides to abruptly stop a medicine. Patient expressed understanding. Brief self-care and coping skill education provided.  Patient Goals/Self-Care Activities: Over the next 120 days Attend scheduled medical appointments Utilize healthy coping skills and supportive resources discussed Contact PCP with any questions or concerns Keep 90 percent of counseling appointments Call your insurance provider for more information about your Enhanced Benefits  Check out counseling resources provided  Begin personal counseling with LCSW, to reduce and manage symptoms of Depression and Stress, until well-established with mental health provider Accept all calls from representative with [facility] in an effort to establish ongoing mental health counseling and supportive services. Incorporate into daily practice - relaxation techniques, deep breathing exercises, and mindfulness meditation strategies. Talk about feelings with friends, family members, spiritual advisor, etc. Contact LCSW directly [phone]), if you have  questions, need assistance, or if additional social work needs are identified between now and our next scheduled telephone outreach call. Call 988 for mental health hotline/crisis line if needed (24/7 available) Try techniques to reduce symptoms of anxiety/negative thinking (deep breathing, distraction, positive self talk, etc)  - develop a personal safety plan - develop a plan to deal with triggers like holidays, anniversaries - exercise at least 2 to 3 times per week - have a plan for how to handle bad days - journal feelings and what helps to feel better or worse - spend time or  talk with others at least 2 to 3 times per week - watch for early signs of feeling worse - begin personal counseling - call and visit an old friend - check out volunteer opportunities - join a support group - laugh; watch a funny movie or comedian - learn and use visualization or guided imagery - perform a random act of kindness - practice relaxation or meditation daily - start or continue a personal journal - practice positive thinking and self-talk -continue with compliance of taking medication  -identify current effective and ineffective coping strategies.  -implement positive self-talk in care to increase self-esteem, confidence and feelings of control.  -consider alternative and complementary therapy approaches such as meditation, mindfulness or yoga.  -journaling, prayer, worship services, meditation or pastoral counseling.  -increase participation in pleasurable group activities such as hobbies, singing, sports or volunteering).  -consider the use of meditative movement therapy such as tai chi, yoga or qigong.  -start a regular daily exercise program based on tolerance, ability and patient choice to support positive thinking and activity    If you are experiencing a Mental Health or Behavioral Health Crisis or need someone to talk to, please call the Suicide and Crisis Lifeline: 988    Patient Goals: Follow up goal       06/02/2023    2:16 PM 04/30/2023   11:24 AM 03/04/2023    2:33 PM 05/27/2022    2:43 PM 05/19/2022    8:38 AM  Depression screen PHQ 2/9  Decreased Interest 3 2 1 1 1   Down, Depressed, Hopeless 2 2 1 1 1   PHQ - 2 Score 5 4 2 2 2   Altered sleeping 1 3 1 1 1   Tired, decreased energy 3 3 1 1  0  Change in appetite 3 2 1 1  0  Feeling bad or failure about yourself  1 2 0 1 0  Trouble concentrating 1 0 0 1 0  Moving slowly or fidgety/restless 1 0 0 0 0  Suicidal thoughts 0 0 0 0   PHQ-9 Score 15 14 5 7 3   Difficult doing work/chores Somewhat difficult Somewhat  difficult       Follow up:  Patient agrees to Care Plan and Follow-up.  Plan: The Managed Medicaid care management team will reach out to the patient again over the next 30 days.  Date/time of next scheduled Social Work care management/care coordination outreach:  07/09/23 at 915 am.  Dickie La, BSW, MSW, LCSW Managed Medicaid LCSW [facility]  [facility] [email or web address] Phone: [phone]

## 2023-06-24 ENCOUNTER — Other Ambulatory Visit: Payer: Medicaid Other

## 2023-06-24 NOTE — Patient Instructions (Signed)
Visit Information  Ms. Danielle Hurley was given information about Medicaid Managed Care team care coordination services as a part of their [facility] Community Plan Medicaid benefit. Danielle Hurley verbally consented to engagement with the [facility] Managed Care team.   If you are experiencing a medical emergency, please call 911 or report to your local emergency department or urgent care.   If you have a non-emergency medical problem during routine business hours, please contact your provider's office and ask to speak with a nurse.   For questions related to your [facility], please call: [phone] or visit the homepage here: [email or web address]  If you would like to schedule transportation through your [facility], please call the following number at least 2 days in advance of your appointment: [phone]   Rides for urgent appointments can also be made after hours by calling Member Services.  Call the Behavioral Health Crisis Line at [phone], at any time, 24 hours a day, 7 days a week. If you are in danger or need immediate medical attention call 911.  If you would like help to quit smoking, call 1-800-QUIT-NOW (816-282-9735) OR Espaol: 1-855-Djelo-Ya (6-160-737-1062) o para ms informacin haga clic aqu or Text READY to 694-854 to register via text  Ms. Danielle Hurley - following are the goals we discussed in your visit today:   Goals Addressed   None      The  Patient                                              has been provided with contact information for the Managed Medicaid care management team and has been advised to call with any health related questions or concerns.   Gus Puma, Kenard Gower, MHA [facility]  Managed Medicaid Social Worker [phone]   Following is a copy of your plan of care:  There are no care plans that you recently modified to display for  this patient.

## 2023-06-24 NOTE — Patient Outreach (Signed)
Medicaid Managed Care Social Work Note  06/24/2023 Name:  Danielle Hurley MRN:  [id number] DOB:  May 23, 1961  Danielle Hurley is an 62 y.o. year old female who is a primary patient of Danielle Hurley, [place].  The Medicaid Managed Care Coordination team was consulted for assistance with:  Community Resources   Danielle Hurley was given information about Medicaid Managed Care Coordination team services today. Danielle Hurley Patient agreed to services and verbal consent obtained.  Engaged with patient  for by telephone forfollow up visit in response to referral for case management and/or care coordination services.   Assessments/Interventions:  Review of past medical history, allergies, medications, health status, including review of consultants reports, laboratory and other test data, was performed as part of comprehensive evaluation and provision of chronic care management services.  SDOH: (Social Determinant of Health) assessments and interventions performed: SDOH Interventions    Flowsheet Row Patient Outreach Telephone from 06/18/2023 in [place] POPULATION HEALTH DEPARTMENT Patient Outreach Telephone from 06/04/2023 in [place] POPULATION HEALTH DEPARTMENT Patient Outreach Telephone from 05/28/2023 in [place] POPULATION HEALTH DEPARTMENT Patient Outreach Telephone from 05/12/2023 in [place] POPULATION HEALTH DEPARTMENT Patient Outreach Telephone from 04/30/2023 in [place] POPULATION HEALTH DEPARTMENT  SDOH Interventions       Depression Interventions/Treatment  -- -- -- -- Counseling  Print production planner and Psychiatry Interest]  Stress Interventions Offered [employer], Provide Counseling Offered [employer] Resources, Provide Counseling Offered Community Wellness Resources, Provide Counseling Offered Community Wellness Resources, Provide Counseling Offered [employer] Resources, Provide Counseling     BSW completed a telephone outreach with patient,she states she did  receive the resources BSW sent her. No other resources are needed.  Advanced Directives Status:  Not addressed in this encounter.  Care Plan                 No Known Allergies  Medications Reviewed Today   Medications were not reviewed in this encounter     Patient Active Problem List   Diagnosis Date Noted   MDD (major depressive disorder) 06/02/2023   Generalized anxiety disorder 06/02/2023   Chest pain 07/16/2022   Abnormal ECG 07/16/2022   Chronic left-sided low back pain with left-sided sciatica 07/16/2022   Restless leg syndrome 07/16/2022   Leg cramps 07/16/2022   Abnormal weight gain 07/16/2022   Type 2 diabetes mellitus with hyperglycemia, without long-term current use of insulin (HCC) 05/31/2022   Urinary tract infection with hematuria 05/31/2022   Gastroesophageal reflux disease without esophagitis 05/31/2022   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   Cancer screening 05/19/2022   Overactive bladder 11/02/2021   Hepatic cirrhosis (HCC) 10/21/2021   Immunization counseling 10/21/2021   Pulmonary nodule 08/19/2021   Body mass index 27.0-27.9, adult 08/19/2021   Need for hepatitis B vaccination 07/22/2021   Cellulitis of ear, right 07/08/2021   TB (pulmonary tuberculosis) 06/13/2021   Medication monitoring encounter 06/13/2021   Upper airway cough syndrome 05/08/2021   TB of lung w/ cavitation 05/08/2021   Hypertension associated with diabetes (HCC) 03/31/2021   Diabetes mellitus without complication (HCC) 03/31/2021   Hyperlipidemia associated with type 2 diabetes mellitus (HCC) 03/31/2021   Encounter to establish care 03/31/2021   History of active tuberculosis 03/31/2021   Wheezing 03/31/2021   Chronic hepatitis C without hepatic coma (HCC) 03/31/2021   Chronic left shoulder pain 03/31/2021   Dysuria 03/31/2021   Vaginal yeast infection 03/31/2021    Conditions to be addressed/monitored per PCP order:   community resources  There are no care plans  that you recently modified to display for this patient.   Follow up:  Patient agrees to Care Plan and Follow-up.  Plan: The  Patient has been provided with contact information for the Managed Medicaid care management team and has been advised to call with any health related questions or concerns.   Abelino Derrick, MHA [facility]  Managed [facility] Social Worker [phone]

## 2023-06-25 ENCOUNTER — Ambulatory Visit: Payer: Medicaid Other | Admitting: Internal Medicine

## 2023-06-28 ENCOUNTER — Other Ambulatory Visit: Payer: Self-pay | Admitting: Family Medicine

## 2023-06-28 ENCOUNTER — Other Ambulatory Visit: Payer: Self-pay | Admitting: Nurse Practitioner

## 2023-06-28 DIAGNOSIS — E1165 Type 2 diabetes mellitus with hyperglycemia: Secondary | ICD-10-CM

## 2023-06-28 DIAGNOSIS — M064 Inflammatory polyarthropathy: Secondary | ICD-10-CM

## 2023-06-28 DIAGNOSIS — G8929 Other chronic pain: Secondary | ICD-10-CM

## 2023-07-05 ENCOUNTER — Other Ambulatory Visit: Payer: Self-pay | Admitting: Nurse Practitioner

## 2023-07-05 DIAGNOSIS — K219 Gastro-esophageal reflux disease without esophagitis: Secondary | ICD-10-CM

## 2023-07-07 ENCOUNTER — Other Ambulatory Visit: Payer: Self-pay | Admitting: Family Medicine

## 2023-07-07 DIAGNOSIS — G8929 Other chronic pain: Secondary | ICD-10-CM

## 2023-07-07 DIAGNOSIS — M064 Inflammatory polyarthropathy: Secondary | ICD-10-CM

## 2023-07-09 ENCOUNTER — Other Ambulatory Visit: Payer: Medicaid Other | Admitting: Licensed Clinical Social Worker

## 2023-07-09 NOTE — Patient Outreach (Signed)
Medicaid Managed Care Social Work Note  07/09/2023 Name:  Danielle Hurley MRN:  [id number] DOB:  August 25, 1961  Danielle Hurley is an 62 y.o. year old female who is a primary patient of Danielle Hurley, [place].  The Medicaid Managed Care Coordination team was consulted for assistance with:  Mental Health Counseling and Resources  Danielle Hurley was given information about Medicaid Managed Care Coordination team services today. Danielle Hurley Patient agreed to services and verbal consent obtained.  Engaged with patient  for by telephone forfollow up visit in response to referral for case management and/or care coordination services.   Assessments/Interventions:  Review of past medical history, allergies, medications, health status, including review of consultants reports, laboratory and other test data, was performed as part of comprehensive evaluation and provision of chronic care management services.  SDOH: (Social Determinant of Health) assessments and interventions performed: SDOH Interventions    Flowsheet Row Patient Outreach Telephone from 07/09/2023 in [place] POPULATION HEALTH DEPARTMENT Patient Outreach Telephone from 06/18/2023 in [place] POPULATION HEALTH DEPARTMENT Patient Outreach Telephone from 06/04/2023 in [place] POPULATION HEALTH DEPARTMENT Patient Outreach Telephone from 05/28/2023 in [place] POPULATION HEALTH DEPARTMENT Patient Outreach Telephone from 05/12/2023 in [place] POPULATION HEALTH DEPARTMENT Patient Outreach Telephone from 04/30/2023 in [place] POPULATION HEALTH DEPARTMENT  SDOH Interventions        Depression Interventions/Treatment  -- -- -- -- -- Counseling  Print production planner and Psychiatry Interest]  Stress Interventions Offered [employer], Provide Counseling Offered [employer] Resources, Provide Counseling Offered Community Wellness Resources, Provide Counseling Offered Community Wellness Resources, Provide Counseling Offered Community Wellness  Resources, Provide Counseling Offered Community Wellness Resources, Provide Counseling  Social Connections Interventions Intervention Not Indicated -- -- -- -- --       Advanced Directives Status:  See Care Plan for related entries.  Care Plan                 No Known Allergies  Medications Reviewed Today   Medications were not reviewed in this encounter     Patient Active Problem List   Diagnosis Date Noted   MDD (major depressive disorder) 06/02/2023   Generalized anxiety disorder 06/02/2023   Chest pain 07/16/2022   Abnormal ECG 07/16/2022   Chronic left-sided low back pain with left-sided sciatica 07/16/2022   Restless leg syndrome 07/16/2022   Leg cramps 07/16/2022   Abnormal weight gain 07/16/2022   Type 2 diabetes mellitus with hyperglycemia, without long-term current use of insulin (HCC) 05/31/2022   Urinary tract infection with hematuria 05/31/2022   Gastroesophageal reflux disease without esophagitis 05/31/2022   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   Cancer screening 05/19/2022   Overactive bladder 11/02/2021   Hepatic cirrhosis (HCC) 10/21/2021   Immunization counseling 10/21/2021   Pulmonary nodule 08/19/2021   Body mass index 27.0-27.9, adult 08/19/2021   Need for hepatitis B vaccination 07/22/2021   Cellulitis of ear, right 07/08/2021   TB (pulmonary tuberculosis) 06/13/2021   Medication monitoring encounter 06/13/2021   Upper airway cough syndrome 05/08/2021   TB of lung w/ cavitation 05/08/2021   Hypertension associated with diabetes (HCC) 03/31/2021   Diabetes mellitus without complication (HCC) 03/31/2021   Hyperlipidemia associated with type 2 diabetes mellitus (HCC) 03/31/2021   Encounter to establish care 03/31/2021   History of active tuberculosis 03/31/2021   Wheezing 03/31/2021   Chronic hepatitis C without hepatic coma (HCC) 03/31/2021   Chronic left shoulder pain 03/31/2021   Dysuria 03/31/2021   Vaginal yeast infection 03/31/2021  Conditions to be addressed/monitored per PCP order:  Depression  Care Plan : LCSW Plan of Care  Updates made by [name], LCSW since 07/09/2023 12:00 AM     Problem: Depression Identification (Depression)      Goal: Depressive Symptoms Identified   Note:   Priority: High  Timeframe:  Short-Range Goal Priority:  High Start Date:   04/30/23          Expected End Date:  ongoing                     Follow Up Date--07/29/23 at 9:15am  - keep 90 percent of scheduled appointments -consider counseling or psychiatry -consider bumping up your self-care  -consider creating a stronger support network   Why is this important?             Combatting depression may take some time.            If you don't feel better right away, don't give up on your treatment plan.    Current barriers:   Chronic Mental Health needs related to depression, stress and caregiver stress. Patient requires Support, Education, Resources, Referrals, Advocacy, and Care Coordination, in order to meet Unmet Mental Health Needs. Patient will implement clinical interventions discussed today to decrease symptoms of depression and increase knowledge and/or ability of: coping skills. Mental Health Concerns and Social Isolation Patient lacks knowledge of available community counseling agencies and resources.  Clinical Goal(s): verbalize understanding of plan for management of Anxiety, Depression, and Stress and demonstrate a reduction in symptoms. Patient will connect with a provider for ongoing mental health treatment, increase coping skills, healthy habits, self-management skills, and stress reduction        Clinical Interventions:  Assessed patient's previous and current treatment, coping skills, support system and barriers to care. Patient provided hx. Patient is the primary caregiver of her deaf brother who resides with her. She reports that family came from [place] 3 years ago. Patient reports 3 or 4 adult  kids live nearby. Verbalization of feelings encouraged, motivational interviewing employed Emotional support provided, positive coping strategies explored. Establishing healthy boundaries emphasized and healthy self-care education provided Patient was educated on available mental health resources within their area that accept Medicaid and offer counseling and psychiatry. Email sent to patient today with available mental health resources within her area that accept Medicaid and offer the services that she is interested in. Email included instructions for scheduling at [facility] as well as some crisis support resources and [facility]'s walk in clinic hours Patient reports significant worsening anxiety and depression affecting her ability to function appropriately and carry out daily task. She reports having trouble getting out of the bed some days.  Patient receives strong support from spouse, MIL, and mentor LCSW provided education on relaxation techniques such as meditation, deep breathing, massage, grounding exercises or yoga that can activate the body's relaxation response and ease symptoms of stress and anxiety. LCSW ask that when pt is struggling with difficult emotions and racing thoughts that they start this relaxation response process. LCSW provided extensive education on healthy coping skills for anxiety. SW used active and reflective listening, validated patient's feelings/concerns, and provided emotional support. Patient will work on implementing appropriate self-care habits into their daily routine such as: staying positive, writing a gratitude list, drinking water, staying active around the house, taking their medications as prescribed, combating negative thoughts or emotions and staying connected with their family and friends. Positive reinforcement provided for this  decision to work on this. LCSW provided education on healthy sleep hygiene and what that looks like. LCSW encouraged patient to implement  a night time routine into their schedule that works best for them and that they are able to maintain. Advised patient to implement deep breathing/grounding/meditation/self-care exercises into their nightly routine to combat racing thoughts at night. LCSW encouraged patient to wake up at the same time each day, make their sleeping environment comfortable, exercise when able, to limit naps and to not eat or drink anything right before bed.  Motivational Interviewing employed Depression screen reviewed  PHQ2/ PHQ9 completed or reviewed  Mindfulness or Relaxation training provided Active listening / Reflection utilized  Advance Care and HCPOA education provided Emotional Support Provided Problem Solving /Task Center strategies reviewed Provided psychoeducation for mental health needs  Provided brief CBT  Reviewed mental health medications and discussed importance of compliance:  Quality of sleep assessed & Sleep Hygiene techniques promoted  Participation in counseling encouraged  Verbalization of feelings encouraged  Suicidal Ideation/Homicidal Ideation assessed: Patient denies SI/HI  Review resources, discussed options and provided patient information about  Mental Health Resources Inter-disciplinary care team collaboration (see longitudinal plan of care) 05/12/23- Patient reviewed list and is agreeable to referral to [facility] as she wishes to stay within the [facility] network. She reports that they received some great health related news regarding her brother which has helped them reduce traveling back and forth to those necessary medical appointments. 05/28/23- Patient states that [facility] called her and she has scheduled both her psychiatry and counseling appointment. She reports that she psychiatry appointment is with a female and she prefers females providers but desired to get the earliest appointment for medication management. Patient denies any current crises at the time. She is agreeable to [facility]  LCSW contacting her on 06/04/23 to follow up.and ensure mental health support was established. Update- Patient recently completed her initial psychiatry visit on 06/02/23. She reports that she was prescribed zoloft but she and her daughter as now hesitant about her taking this new medication. Patient as advised to contact [facility] and PCP office for further information/direction on new psychiatric medication. Patient was appreciative of call and support. Patient will start counseling at [facility] next month. Update- Patient reports that she has decided to discontinue zoloft because she did research on medication side effects and read that she is unable to take tylenol with medication. [facility] LCSW strongly encouraged her to contact her provider at [facility] to follow up on this and to contact her prescriber for questions/concerns before she changes her psychiatric medications next time she decides to abruptly stop a medicine. Patient expressed understanding. Brief self-care and coping skill education provided. 07/09/23- Patient reports doing well but she is struggling with chronic lower back pain which triggers her stress and depression. Advised patient to exercise 10-20 minutes everyday, go outside in the sunlight everyday for at least 10-15 minutes, drink 6-8 glasses of water a day, take vitamins/medications as directed and try to increase sleep. Patient has her initial psychiatry and counseling appointments on 07/20/23 and 07/21/23 and reports that her daughter will transport her to these appointments. [facility] LCSW will follow up in three weeks. Patient Goals/Self-Care Activities: Over the next 120 days Attend scheduled medical appointments Utilize healthy coping skills and supportive resources discussed Contact PCP with any questions or concerns Keep 90 percent of counseling appointments Call your insurance provider for more information about your Enhanced Benefits  Check out counseling resources provided  Begin personal  counseling  with LCSW, to reduce and manage symptoms of Depression and Stress, until well-established with mental health provider Accept all calls from representative with [facility] in an effort to establish ongoing mental health counseling and supportive services. Incorporate into daily practice - relaxation techniques, deep breathing exercises, and mindfulness meditation strategies. Talk about feelings with friends, family members, spiritual advisor, etc. Contact LCSW directly [phone]), if you have questions, need assistance, or if additional social work needs are identified between now and our next scheduled telephone outreach call. Call 988 for mental health hotline/crisis line if needed (24/7 available) Try techniques to reduce symptoms of anxiety/negative thinking (deep breathing, distraction, positive self talk, etc)  - develop a personal safety plan - develop a plan to deal with triggers like holidays, anniversaries - exercise at least 2 to 3 times per week - have a plan for how to handle bad days - journal feelings and what helps to feel better or worse - spend time or talk with others at least 2 to 3 times per week - watch for early signs of feeling worse - begin personal counseling - call and visit an old friend - check out volunteer opportunities - join a support group - laugh; watch a funny movie or comedian - learn and use visualization or guided imagery - perform a random act of kindness - practice relaxation or meditation daily - start or continue a personal journal - practice positive thinking and self-talk -continue with compliance of taking medication  -identify current effective and ineffective coping strategies.  -implement positive self-talk in care to increase self-esteem, confidence and feelings of control.  -consider alternative and complementary therapy approaches such as meditation, mindfulness or yoga.  -journaling, prayer, worship services, meditation or  pastoral counseling.  -increase participation in pleasurable group activities such as hobbies, singing, sports or volunteering).  -consider the use of meditative movement therapy such as tai chi, yoga or qigong.  -start a regular daily exercise program based on tolerance, ability and patient choice to support positive thinking and activity    If you are experiencing a Mental Health or Behavioral Health Crisis or need someone to talk to, please call the Suicide and Crisis Lifeline: 988    Patient Goals: Follow up goal     Follow up:  Patient agrees to Care Plan and Follow-up.  Plan: The Managed Medicaid care management team will reach out to the patient again over the next 30 days.  Dickie La, BSW, MSW, [employer] Managed Medicaid LCSW [facility]  [facility] [email or web address] Phone: [phone]

## 2023-07-09 NOTE — Patient Instructions (Signed)
Visit Information  Danielle Hurley was given information about Medicaid Managed Care team care coordination services as a part of their [facility] Medicaid benefit. Danielle Hurley verbally consented to engagement with the [facility] Managed Care team.   If you are experiencing a medical emergency, please call 911 or report to your local emergency department or urgent care.   If you have a non-emergency medical problem during routine business hours, please contact your provider's office and ask to speak with a nurse.   For questions related to your [facility], please call: [phone] or visit the homepage here: [email or web address]  If you would like to schedule transportation through your [facility], please call the following number at least 2 days in advance of your appointment: [phone]   Rides for urgent appointments can also be made after hours by calling Member Services.  Call the Behavioral Health Crisis Line at [phone], at any time, 24 hours a day, 7 days a week. If you are in danger or need immediate medical attention call 911.  If you would like help to quit smoking, call 1-800-QUIT-NOW (571-268-2997) OR Espaol: 1-855-Djelo-Ya (1-324-401-0272) o para ms informacin haga clic aqu or Text READY to 536-644 to register via text  Following is a copy of your plan of care:  Care Plan : LCSW Plan of Care  Updates made by [name], LCSW since 07/09/2023 12:00 AM     Problem: Depression Identification (Depression)      Goal: Depressive Symptoms Identified   Note:   Priority: High  Timeframe:  Short-Range Goal Priority:  High Start Date:   04/30/23          Expected End Date:  ongoing                     Follow Up Date--07/29/23 at 9:15am  - keep 90 percent of scheduled appointments -consider counseling or psychiatry -consider bumping up your  self-care  -consider creating a stronger support network   Why is this important?             Combatting depression may take some time.            If you don't feel better right away, don't give up on your treatment plan.    Current barriers:   Chronic Mental Health needs related to depression, stress and caregiver stress. Patient requires Support, Education, Resources, Referrals, Advocacy, and Care Coordination, in order to meet Unmet Mental Health Needs. Patient will implement clinical interventions discussed today to decrease symptoms of depression and increase knowledge and/or ability of: coping skills. Mental Health Concerns and Social Isolation Patient lacks knowledge of available community counseling agencies and resources.  Clinical Goal(s): verbalize understanding of plan for management of Anxiety, Depression, and Stress and demonstrate a reduction in symptoms. Patient will connect with a provider for ongoing mental health treatment, increase coping skills, healthy habits, self-management skills, and stress reduction        Patient Goals/Self-Care Activities: Over the next 120 days Attend scheduled medical appointments Utilize healthy coping skills and supportive resources discussed Contact PCP with any questions or concerns Keep 90 percent of counseling appointments Call your insurance provider for more information about your Enhanced Benefits  Check out counseling resources provided  Begin personal counseling with LCSW, to reduce and manage symptoms of Depression and Stress, until well-established with mental health provider Accept all calls from representative with [facility] in an effort to  establish ongoing mental health counseling and supportive services. Incorporate into daily practice - relaxation techniques, deep breathing exercises, and mindfulness meditation strategies. Talk about feelings with friends, family members, spiritual advisor, etc. Contact LCSW directly  [phone]), if you have questions, need assistance, or if additional social work needs are identified between now and our next scheduled telephone outreach call. Call 988 for mental health hotline/crisis line if needed (24/7 available) Try techniques to reduce symptoms of anxiety/negative thinking (deep breathing, distraction, positive self talk, etc)  - develop a personal safety plan - develop a plan to deal with triggers like holidays, anniversaries - exercise at least 2 to 3 times per week - have a plan for how to handle bad days - journal feelings and what helps to feel better or worse - spend time or talk with others at least 2 to 3 times per week - watch for early signs of feeling worse - begin personal counseling - call and visit an old friend - check out volunteer opportunities - join a support group - laugh; watch a funny movie or comedian - learn and use visualization or guided imagery - perform a random act of kindness - practice relaxation or meditation daily - start or continue a personal journal - practice positive thinking and self-talk -continue with compliance of taking medication  -identify current effective and ineffective coping strategies.  -implement positive self-talk in care to increase self-esteem, confidence and feelings of control.  -consider alternative and complementary therapy approaches such as meditation, mindfulness or yoga.  -journaling, prayer, worship services, meditation or pastoral counseling.  -increase participation in pleasurable group activities such as hobbies, singing, sports or volunteering).  -consider the use of meditative movement therapy such as tai chi, yoga or qigong.  -start a regular daily exercise program based on tolerance, ability and patient choice to support positive thinking and activity    If you are experiencing a Mental Health or Behavioral Health Crisis or need someone to talk to, please call the Suicide and Crisis  Lifeline: 988    Patient Goals: Follow up goal        24- Hour Availability:    [facility]  [address] [place], [place] [phone] Crisis [phone]   Family Service of the [facility] [phone]  [place] [facility]  [phone]    [facility]  [phone] (after hours)   Therapeutic Alternative/Mobile Crisis   [phone]   [place] National Suicide Hotline  7725159762 Len Child[place]) [place] 841   Call [phone] for mental health emergencies   [facility]  2023091210);  [place] and [employer]  [phone]); [place], [place], [place], [place], [place], [place], [place]    [place] [facility] for [facility] Residents For 24/7 walk-up access to mental health services for [facility] children (4+), adolescents and adults, please visit the [facility] located at [address] in [place], [place].  *[facility] also provides comprehensive outpatient behavioral health services in a variety of locations around the [place].  Connect With [place] [address] [place], [place] [address] HelpLine: 567-751-5502 or 1-2195014385  Get Directions  Find Help 24/7 By Phone Call our 24-hour HelpLine at 617-189-1120 or (716) 174-4531 for immediate assistance for mental health and substance abuse issues.  Walk-In Help [place]: [facility] (Ages 4 and Up) [place]: Emergency Dept., [facility] Additional Resources National Hopeline Network: 1-800-SUICIDE [employer]  Suicide Prevention Lifeline: 1-800-273-TALK     Dickie La, BSW, MSW, [employer] Managed Medicaid LCSW [facility]  [facility] [email or web address] Phone: [phone]
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
## 2023-07-19 ENCOUNTER — Ambulatory Visit: Payer: Medicaid Other | Admitting: Podiatry

## 2023-07-20 ENCOUNTER — Ambulatory Visit (INDEPENDENT_AMBULATORY_CARE_PROVIDER_SITE_OTHER): Payer: Medicaid Other | Admitting: Mental Health

## 2023-07-20 DIAGNOSIS — F411 Generalized anxiety disorder: Secondary | ICD-10-CM | POA: Diagnosis not present

## 2023-07-20 DIAGNOSIS — F321 Major depressive disorder, single episode, moderate: Secondary | ICD-10-CM

## 2023-07-21 ENCOUNTER — Ambulatory Visit (INDEPENDENT_AMBULATORY_CARE_PROVIDER_SITE_OTHER): Payer: Medicaid Other | Admitting: Psychiatry

## 2023-07-21 ENCOUNTER — Encounter (HOSPITAL_COMMUNITY): Payer: Self-pay | Admitting: Psychiatry

## 2023-07-21 VITALS — HR 73 | Ht 71.0 in | Wt 209.0 lb

## 2023-07-21 DIAGNOSIS — F17201 Nicotine dependence, unspecified, in remission: Secondary | ICD-10-CM | POA: Diagnosis not present

## 2023-07-21 DIAGNOSIS — F411 Generalized anxiety disorder: Secondary | ICD-10-CM | POA: Diagnosis not present

## 2023-07-21 DIAGNOSIS — F329 Major depressive disorder, single episode, unspecified: Secondary | ICD-10-CM | POA: Diagnosis not present

## 2023-07-21 DIAGNOSIS — E559 Vitamin D deficiency, unspecified: Secondary | ICD-10-CM

## 2023-07-21 MED ORDER — FLUOXETINE HCL 10 MG PO CAPS: 10.0000 mg | ORAL_CAPSULE | Freq: Every day | ORAL | 3 refills | Status: DC

## 2023-07-21 NOTE — Addendum Note (Signed)
Addended by: Tia Masker on: 07/21/2023 04:09 PM   Modules accepted: Orders

## 2023-07-21 NOTE — Patient Instructions (Signed)
Thank you for attending your appointment today.  -- stop taking sertraline (Zoloft) -- start taking fluoxetine (Prozac) 10 mg daily in the morning  Please call the office if fluoxetine (Prozac) is giving you intolerable side-effects  Please do not make any changes to medications without first discussing with your provider. If you are experiencing a psychiatric emergency, please call 911 or present to your nearest emergency department. Additional crisis, medication management, and therapy resources are included below.  [facility]  [address] [place], [place] [address] [phone] WALK-IN URGENT CARE 24/7 FOR ANYONE [address], [place], [place]  [phone] Fax: [phone] [email or web address] *Interpreters available *Accepts all insurance and uninsured for Urgent Care needs *Accepts Medicaid and uninsured for outpatient treatment (below)      ONLY FOR [facility]  Below:    Outpatient New Patient Assessment/Therapy Walk-ins:        Monday -Thursday 8am until slots are full.        Every Friday 1pm-4pm  (first come, first served)                   New Patient Psychiatry/Medication Management        Monday-Friday 8am-11am (first come, first served)               For all walk-ins we ask that you arrive by 7:15am, because patients will be seen in the order of arrival.
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
## 2023-07-28 ENCOUNTER — Ambulatory Visit (INDEPENDENT_AMBULATORY_CARE_PROVIDER_SITE_OTHER): Payer: Medicaid Other | Admitting: Podiatry

## 2023-07-28 DIAGNOSIS — Z91199 Patient's noncompliance with other medical treatment and regimen due to unspecified reason: Secondary | ICD-10-CM

## 2023-07-28 NOTE — Progress Notes (Signed)
No show

## 2023-07-29 ENCOUNTER — Other Ambulatory Visit: Payer: Medicaid Other | Admitting: Licensed Clinical Social Worker

## 2023-07-29 NOTE — Patient Instructions (Signed)
Visit Information  Danielle Hurley was given information about Medicaid Managed Care team care coordination services as a part of their [facility] Medicaid benefit. Danielle Hurley verbally consented to engagement with the [facility] Managed Care team.   If you are experiencing a medical emergency, please call 911 or report to your local emergency department or urgent care.   If you have a non-emergency medical problem during routine business hours, please contact your provider's office and ask to speak with a nurse.   For questions related to your [facility], please call: [phone] or visit the homepage here: [email or web address]  If you would like to schedule transportation through your [facility], please call the following number at least 2 days in advance of your appointment: [phone]   Rides for urgent appointments can also be made after hours by calling Member Services.  Call the Behavioral Health Crisis Line at [phone], at any time, 24 hours a day, 7 days a week. If you are in danger or need immediate medical attention call 911.  If you would like help to quit smoking, call 1-800-QUIT-NOW (314-521-7866) OR Espaol: 1-855-Djelo-Ya (7-425-956-3875) o para ms informacin haga clic aqu or Text READY to 643-329 to register via text  Following is a copy of your plan of care:  Care Plan : LCSW Plan of Care  Updates made by [name], LCSW since 07/29/2023 12:00 AM     Problem: Depression Identification (Depression)      Goal: Depressive Symptoms Identified   Note:   Priority: High  Timeframe:  Short-Range Goal Priority:  High Start Date:   04/30/23          Expected End Date:  ongoing                     Follow Up Date--08/12/23 at 9:15am  - keep 90 percent of scheduled appointments -consider counseling or psychiatry -consider bumping up  your self-care  -consider creating a stronger support network   Why is this important?             Combatting depression may take some time.            If you don't feel better right away, don't give up on your treatment plan.    Current barriers:   Chronic Mental Health needs related to depression, stress and caregiver stress. Patient requires Support, Education, Resources, Referrals, Advocacy, and Care Coordination, in order to meet Unmet Mental Health Needs. Patient will implement clinical interventions discussed today to decrease symptoms of depression and increase knowledge and/or ability of: coping skills. Mental Health Concerns and Social Isolation Patient lacks knowledge of available community counseling agencies and resources.  Clinical Goal(s): verbalize understanding of plan for management of Anxiety, Depression, and Stress and demonstrate a reduction in symptoms. Patient will connect with a provider for ongoing mental health treatment, increase coping skills, healthy habits, self-management skills, and stress reduction        Patient Goals/Self-Care Activities: Over the next 120 days Attend scheduled medical appointments Utilize healthy coping skills and supportive resources discussed Contact PCP with any questions or concerns Keep 90 percent of counseling appointments Call your insurance provider for more information about your Enhanced Benefits  Check out counseling resources provided  Begin personal counseling with LCSW, to reduce and manage symptoms of Depression and Stress, until well-established with mental health provider Accept all calls from representative with [facility] in an effort to  establish ongoing mental health counseling and supportive services. Incorporate into daily practice - relaxation techniques, deep breathing exercises, and mindfulness meditation strategies. Talk about feelings with friends, family members, spiritual advisor, etc. Contact LCSW directly  [phone]), if you have questions, need assistance, or if additional social work needs are identified between now and our next scheduled telephone outreach call. Call 988 for mental health hotline/crisis line if needed (24/7 available) Try techniques to reduce symptoms of anxiety/negative thinking (deep breathing, distraction, positive self talk, etc)  - develop a personal safety plan - develop a plan to deal with triggers like holidays, anniversaries - exercise at least 2 to 3 times per week - have a plan for how to handle bad days - journal feelings and what helps to feel better or worse - spend time or talk with others at least 2 to 3 times per week - watch for early signs of feeling worse - begin personal counseling - call and visit an old friend - check out volunteer opportunities - join a support group - laugh; watch a funny movie or comedian - learn and use visualization or guided imagery - perform a random act of kindness - practice relaxation or meditation daily - start or continue a personal journal - practice positive thinking and self-talk -continue with compliance of taking medication  -identify current effective and ineffective coping strategies.  -implement positive self-talk in care to increase self-esteem, confidence and feelings of control.  -consider alternative and complementary therapy approaches such as meditation, mindfulness or yoga.  -journaling, prayer, worship services, meditation or pastoral counseling.  -increase participation in pleasurable group activities such as hobbies, singing, sports or volunteering).  -consider the use of meditative movement therapy such as tai chi, yoga or qigong.  -start a regular daily exercise program based on tolerance, ability and patient choice to support positive thinking and activity    If you are experiencing a Mental Health or Behavioral Health Crisis or need someone to talk to, please call the Suicide and Crisis  Lifeline: 988    Patient Goals: Follow up goal     ,    24- Hour Availability:    [facility]  [address] [place], [place] [phone] Crisis [phone]   [facility] Service of the [facility] [phone]  [place] [facility]  [phone]    [facility]  [phone] (after hours)   Therapeutic Alternative/Mobile Crisis   [phone]   [place] National Suicide Hotline  506-708-7193 [place]) [place] [address]   Call (915)329-2909 for mental health emergencies   [facility]  [phone]);  [place] and [employer]  [phone]); [place], [place], [place], [place], [place], [place], [place]    [place] [facility] for [facility] Residents For 24/7 walk-up access to mental health services for [facility] children (4+), adolescents and adults, please visit the [facility] located at [address] in [place], [place].  *[facility] also provides comprehensive outpatient behavioral health services in a variety of locations around the [place].  Connect With [place] [address] [place], [place] [address] HelpLine: 343-131-7345 or 1-787-729-3590  Get Directions  Find Help 24/7 By Phone Call our 24-hour HelpLine at 772 419 4375 or 805-065-2131 for immediate assistance for mental health and substance abuse issues.  Walk-In Help [place]: [facility] (Ages 4 and Up) [place]: Emergency Dept., [facility] Additional Resources National Hopeline Network: 1-800-SUICIDE The  National Suicide Prevention Lifeline: 1-800-273-TALK     Dickie La, BSW, MSW, [employer] Managed Medicaid LCSW [facility]  [facility] [email or web address] Phone: [phone]

## 2023-07-29 NOTE — Patient Outreach (Signed)
Medicaid Managed Care Social Work Note  07/29/2023 Name:  Danielle Hurley MRN:  [id number] DOB:  07-May-1961  Danielle Hurley is an 62 y.o. year old female who is a primary patient of Danielle Hurley, [place].  The Medicaid Managed Care Coordination team was consulted for assistance with:  Mental Health Counseling and Resources  Danielle Hurley was given information about Medicaid Managed Care Coordination team services today. Danielle Hurley Patient agreed to services and verbal consent obtained.  Engaged with patient  for by telephone forfollow up visit in response to referral for case management and/or care coordination services.   Assessments/Interventions:  Review of past medical history, allergies, medications, health status, including review of consultants reports, laboratory and other test data, was performed as part of comprehensive evaluation and provision of chronic care management services.  SDOH: (Social Determinant of Health) assessments and interventions performed: SDOH Interventions    Flowsheet Row Patient Outreach Telephone from 07/29/2023 in [place] POPULATION HEALTH DEPARTMENT Patient Outreach Telephone from 07/09/2023 in [place] POPULATION HEALTH DEPARTMENT Patient Outreach Telephone from 06/18/2023 in [place] POPULATION HEALTH DEPARTMENT Patient Outreach Telephone from 06/04/2023 in [place] POPULATION HEALTH DEPARTMENT Patient Outreach Telephone from 05/28/2023 in [place] POPULATION HEALTH DEPARTMENT Patient Outreach Telephone from 05/12/2023 in [place] POPULATION HEALTH DEPARTMENT  SDOH Interventions        Stress Interventions Provide Counseling, Other (Comment) Offered [employer], Provide Counseling Offered [employer], Provide Counseling Offered [employer] [facility], Provide Counseling Offered [facility], Provide Counseling Offered [facility], Provide Counseling  Social Connections  Interventions -- Intervention Not Indicated -- -- -- --       Advanced Directives Status:  See Care Plan for related entries.  Care Plan                 No Known Allergies  Medications Reviewed Today   Medications were not reviewed in this encounter     Patient Active Problem List   Diagnosis Date Noted   Tobacco use disorder, severe, in sustained remission 07/21/2023   MDD (major depressive disorder) 06/02/2023   GAD (generalized anxiety disorder) 06/02/2023   Chest pain 07/16/2022   Abnormal ECG 07/16/2022   Chronic left-sided low back pain with left-sided sciatica 07/16/2022   Restless leg syndrome 07/16/2022   Leg cramps 07/16/2022   Abnormal weight gain 07/16/2022   Type 2 diabetes mellitus with hyperglycemia, without long-term current use of insulin (HCC) 05/31/2022   Urinary tract infection with hematuria 05/31/2022   Gastroesophageal reflux disease without esophagitis 05/31/2022   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   Cancer screening 05/19/2022   Overactive bladder 11/02/2021   Hepatic cirrhosis (HCC) 10/21/2021   Immunization counseling 10/21/2021   Pulmonary nodule 08/19/2021   Body mass index 27.0-27.9, adult 08/19/2021   Need for hepatitis B vaccination 07/22/2021   Cellulitis of ear, right 07/08/2021   TB (pulmonary tuberculosis) 06/13/2021   Medication monitoring encounter 06/13/2021   Upper airway cough syndrome 05/08/2021   TB of lung w/ cavitation 05/08/2021   Hypertension associated with diabetes (HCC) 03/31/2021   Diabetes mellitus without complication (HCC) 03/31/2021   Hyperlipidemia associated with type 2 diabetes mellitus (HCC) 03/31/2021   Encounter to establish care 03/31/2021   History of active tuberculosis 03/31/2021   Wheezing 03/31/2021   Chronic hepatitis C without hepatic coma (HCC) 03/31/2021   Chronic left shoulder pain 03/31/2021   Dysuria 03/31/2021   Vaginal yeast infection 03/31/2021    Conditions to be  addressed/monitored per PCP order:  Depression  Care Plan : LCSW Plan of Care  Updates made by Danielle Bryant, LCSW since 07/29/2023 12:00 AM     Problem: Depression Identification (Depression)      Goal: Depressive Symptoms Identified   Note:   Priority: High  Timeframe:  Short-Range Goal Priority:  High Start Date:   04/30/23          Expected End Date:  ongoing                     Follow Up Date--08/12/23 at 9:15am  - keep 90 percent of scheduled appointments -consider counseling or psychiatry -consider bumping up your self-care  -consider creating a stronger support network   Why is this important?             Combatting depression may take some time.            If you don't feel better right away, don't give up on your treatment plan.    Current barriers:   Chronic Mental Health needs related to depression, stress and caregiver stress. Patient requires Support, Education, Resources, Referrals, Advocacy, and Care Coordination, in order to meet Unmet Mental Health Needs. Patient will implement clinical interventions discussed today to decrease symptoms of depression and increase knowledge and/or ability of: coping skills. Mental Health Concerns and Social Isolation Patient lacks knowledge of available community counseling agencies and resources.  Clinical Goal(s): verbalize understanding of plan for management of Anxiety, Depression, and Stress and demonstrate a reduction in symptoms. Patient will connect with a provider for ongoing mental health treatment, increase coping skills, healthy habits, self-management skills, and stress reduction        Clinical Interventions:  Assessed patient's previous and current treatment, coping skills, support system and barriers to care. Patient provided hx. Patient is the primary caregiver of her deaf brother who resides with her. She reports that family came from [place] 3 years ago. Patient reports 3 or 4 adult kids live  nearby. Verbalization of feelings encouraged, motivational interviewing employed Emotional support provided, positive coping strategies explored. Establishing healthy boundaries emphasized and healthy self-care education provided Patient was educated on available mental health resources within their area that accept Medicaid and offer counseling and psychiatry. Email sent to patient today with available mental health resources within her area that accept Medicaid and offer the services that she is interested in. Email included instructions for scheduling at [facility] as well as some crisis support resources and [facility]'s walk in clinic hours Patient reports significant worsening anxiety and depression affecting her ability to function appropriately and carry out daily task. She reports having trouble getting out of the bed some days.  Patient receives strong support from spouse, MIL, and mentor LCSW provided education on relaxation techniques such as meditation, deep breathing, massage, grounding exercises or yoga that can activate the body's relaxation response and ease symptoms of stress and anxiety. LCSW ask that when pt is struggling with difficult emotions and racing thoughts that they start this relaxation response process. LCSW provided extensive education on healthy coping skills for anxiety. SW used active and reflective listening, validated patient's feelings/concerns, and provided emotional support. Patient will work on implementing appropriate self-care habits into their daily routine such as: staying positive, writing a gratitude list, drinking water, staying active around the house, taking their medications as prescribed, combating negative thoughts or emotions and staying connected with their family and friends. Positive reinforcement provided for this decision to work  on this. LCSW provided education on healthy sleep hygiene and what that looks like. LCSW encouraged patient to implement a night  time routine into their schedule that works best for them and that they are able to maintain. Advised patient to implement deep breathing/grounding/meditation/self-care exercises into their nightly routine to combat racing thoughts at night. LCSW encouraged patient to wake up at the same time each day, make their sleeping environment comfortable, exercise when able, to limit naps and to not eat or drink anything right before bed.  Motivational Interviewing employed Depression screen reviewed  PHQ2/ PHQ9 completed or reviewed  Mindfulness or Relaxation training provided Active listening / Reflection utilized  Advance Care and HCPOA education provided Emotional Support Provided Problem Solving /Task Center strategies reviewed Provided psychoeducation for mental health needs  Provided brief CBT  Reviewed mental health medications and discussed importance of compliance:  Quality of sleep assessed & Sleep Hygiene techniques promoted  Participation in counseling encouraged  Verbalization of feelings encouraged  Suicidal Ideation/Homicidal Ideation assessed: Patient denies SI/HI  Review resources, discussed options and provided patient information about  Mental Health Resources Inter-disciplinary care team collaboration (see longitudinal plan of care) 05/12/23- Patient reviewed list and is agreeable to referral to [facility] as she wishes to stay within the [facility] network. She reports that they received some great health related news regarding her brother which has helped them reduce traveling back and forth to those necessary medical appointments. 05/28/23- Patient states that [facility] called her and she has scheduled both her psychiatry and counseling appointment. She reports that she psychiatry appointment is with a female and she prefers females providers but desired to get the earliest appointment for medication management. Patient denies any current crises at the time. She is agreeable to [facility] LCSW  contacting her on 06/04/23 to follow up.and ensure mental health support was established. Update- Patient recently completed her initial psychiatry visit on 06/02/23. She reports that she was prescribed zoloft but she and her daughter as now hesitant about her taking this new medication. Patient as advised to contact [facility] and PCP office for further information/direction on new psychiatric medication. Patient was appreciative of call and support. Patient will start counseling at [facility] next month. Update- Patient reports that she has decided to discontinue zoloft because she did research on medication side effects and read that she is unable to take tylenol with medication. [facility] LCSW strongly encouraged her to contact her provider at [facility] to follow up on this and to contact her prescriber for questions/concerns before she changes her psychiatric medications next time she decides to abruptly stop a medicine. Patient expressed understanding. Brief self-care and coping skill education provided. 07/09/23- Patient reports doing well but she is struggling with chronic lower back pain which triggers her stress and depression. Advised patient to exercise 10-20 minutes everyday, go outside in the sunlight everyday for at least 10-15 minutes, drink 6-8 glasses of water a day, take vitamins/medications as directed and try to increase sleep. Patient has her initial psychiatry and counseling appointments on 07/20/23 and 07/21/23 and reports that her daughter will transport her to these appointments. [facility] LCSW will follow up in three weeks. Update- Patient is now established with long term mental health providers at [facility]. Patient reports that she was put on Prozac and was told by her psychiatrist that she can still take ibuprofen with this but when she researched this online, she found differently. [facility] LCSW will message her psychiatrist inquiring about this concern. Patient looks forward to  her next therapy appointment on 08/11/23. She  reports no urgent matters at this time. Patient was educated on healthy self care and was encouraged to continue making a daily effort implementing these into her routine.  Patient Goals/Self-Care Activities: Over the next 120 days Attend scheduled medical appointments Utilize healthy coping skills and supportive resources discussed Contact PCP with any questions or concerns Keep 90 percent of counseling appointments Call your insurance provider for more information about your Enhanced Benefits  Check out counseling resources provided  Begin personal counseling with LCSW, to reduce and manage symptoms of Depression and Stress, until well-established with mental health provider Accept all calls from representative with [facility] in an effort to establish ongoing mental health counseling and supportive services. Incorporate into daily practice - relaxation techniques, deep breathing exercises, and mindfulness meditation strategies. Talk about feelings with friends, family members, spiritual advisor, etc. Contact LCSW directly [phone]), if you have questions, need assistance, or if additional social work needs are identified between now and our next scheduled telephone outreach call. Call 988 for mental health hotline/crisis line if needed (24/7 available) Try techniques to reduce symptoms of anxiety/negative thinking (deep breathing, distraction, positive self talk, etc)  - develop a personal safety plan - develop a plan to deal with triggers like holidays, anniversaries - exercise at least 2 to 3 times per week - have a plan for how to handle bad days - journal feelings and what helps to feel better or worse - spend time or talk with others at least 2 to 3 times per week - watch for early signs of feeling worse - begin personal counseling - call and visit an old friend - check out volunteer opportunities - join a support group - laugh; watch a funny movie or comedian - learn and use  visualization or guided imagery - perform a random act of kindness - practice relaxation or meditation daily - start or continue a personal journal - practice positive thinking and self-talk -continue with compliance of taking medication  -identify current effective and ineffective coping strategies.  -implement positive self-talk in care to increase self-esteem, confidence and feelings of control.  -consider alternative and complementary therapy approaches such as meditation, mindfulness or yoga.  -journaling, prayer, worship services, meditation or pastoral counseling.  -increase participation in pleasurable group activities such as hobbies, singing, sports or volunteering).  -consider the use of meditative movement therapy such as tai chi, yoga or qigong.  -start a regular daily exercise program based on tolerance, ability and patient choice to support positive thinking and activity    If you are experiencing a Mental Health or Behavioral Health Crisis or need someone to talk to, please call the Suicide and Crisis Lifeline: 988    Patient Goals: Follow up goal     Follow up:  Patient agrees to Care Plan and Follow-up.  Plan: The Managed Medicaid care management team will reach out to the patient again over the next 30 days.  Dickie La, BSW, MSW, [employer] Managed Medicaid LCSW [facility]  [facility] [email or web address] Phone: [phone]

## 2023-08-04 ENCOUNTER — Other Ambulatory Visit: Payer: Self-pay | Admitting: Family Medicine

## 2023-08-04 DIAGNOSIS — M064 Inflammatory polyarthropathy: Secondary | ICD-10-CM

## 2023-08-04 DIAGNOSIS — G8929 Other chronic pain: Secondary | ICD-10-CM

## 2023-08-11 ENCOUNTER — Encounter: Payer: Self-pay | Admitting: Family Medicine

## 2023-08-11 ENCOUNTER — Encounter (HOSPITAL_COMMUNITY): Payer: Self-pay

## 2023-08-11 ENCOUNTER — Ambulatory Visit (HOSPITAL_COMMUNITY): Payer: Medicaid Other | Admitting: Mental Health

## 2023-08-11 ENCOUNTER — Ambulatory Visit (INDEPENDENT_AMBULATORY_CARE_PROVIDER_SITE_OTHER): Payer: Medicaid Other | Admitting: Family Medicine

## 2023-08-11 VITALS — BP 178/80 | HR 86 | Resp 18 | Ht 71.0 in | Wt 209.0 lb

## 2023-08-11 DIAGNOSIS — Z23 Encounter for immunization: Secondary | ICD-10-CM | POA: Diagnosis not present

## 2023-08-11 DIAGNOSIS — E1165 Type 2 diabetes mellitus with hyperglycemia: Secondary | ICD-10-CM

## 2023-08-11 DIAGNOSIS — E1159 Type 2 diabetes mellitus with other circulatory complications: Secondary | ICD-10-CM

## 2023-08-11 DIAGNOSIS — E119 Type 2 diabetes mellitus without complications: Secondary | ICD-10-CM

## 2023-08-11 DIAGNOSIS — Z1231 Encounter for screening mammogram for malignant neoplasm of breast: Secondary | ICD-10-CM | POA: Diagnosis not present

## 2023-08-11 DIAGNOSIS — E1169 Type 2 diabetes mellitus with other specified complication: Secondary | ICD-10-CM

## 2023-08-11 DIAGNOSIS — S134XXA Sprain of ligaments of cervical spine, initial encounter: Secondary | ICD-10-CM

## 2023-08-11 DIAGNOSIS — Z7984 Long term (current) use of oral hypoglycemic drugs: Secondary | ICD-10-CM | POA: Diagnosis not present

## 2023-08-11 DIAGNOSIS — I152 Hypertension secondary to endocrine disorders: Secondary | ICD-10-CM | POA: Diagnosis not present

## 2023-08-11 DIAGNOSIS — E785 Hyperlipidemia, unspecified: Secondary | ICD-10-CM

## 2023-08-11 DIAGNOSIS — M62838 Other muscle spasm: Secondary | ICD-10-CM | POA: Diagnosis not present

## 2023-08-11 MED ORDER — TIRZEPATIDE 2.5 MG/0.5ML ~~LOC~~ SOAJ
2.5000 mg | SUBCUTANEOUS | 2 refills | Status: DC
Start: 2023-08-11 — End: 2023-08-13

## 2023-08-11 MED ORDER — TRUETRACK TEST VI STRP
ORAL_STRIP | 12 refills | Status: DC
Start: 2023-08-11 — End: 2024-02-01

## 2023-08-11 MED ORDER — CYCLOBENZAPRINE HCL 5 MG PO TABS
5.0000 mg | ORAL_TABLET | Freq: Three times a day (TID) | ORAL | 1 refills | Status: DC | PRN
Start: 2023-08-11 — End: 2023-10-04

## 2023-08-11 MED ORDER — BLOOD GLUCOSE MONITORING SUPPL DEVI
1.0000 | Freq: Three times a day (TID) | 0 refills | Status: AC
Start: 2023-08-11 — End: ?

## 2023-08-11 MED ORDER — LANCETS MISC. MISC
1.0000 | Freq: Three times a day (TID) | 0 refills | Status: AC
Start: 2023-08-11 — End: 2023-09-10

## 2023-08-11 MED ORDER — LANCET DEVICE MISC
1.0000 | Freq: Three times a day (TID) | 0 refills | Status: AC
Start: 2023-08-11 — End: 2023-09-10

## 2023-08-11 MED ORDER — VALSARTAN 320 MG PO TABS
320.0000 mg | ORAL_TABLET | Freq: Every day | ORAL | 3 refills | Status: DC
Start: 2023-08-11 — End: 2023-11-10

## 2023-08-11 NOTE — Assessment & Plan Note (Addendum)
A1c increased 11.5 to 11.9.  Patient acknowledges that she has a lot of room for improvement in her nutrition.  She has a sweet tooth and cannot seem to cut back on carbs and sugar.  Encouraged her to make even small changes like adding fiber or protein to her snack or meal that includes carbs to help balance blood sugar levels.  Continue glimepiride 4 mg twice daily, metformin 500 mg twice daily.  Discontinue saxagliptin, start Mounjaro 2.5 mg weekly.  Recheck A1c in 3 months.

## 2023-08-11 NOTE — Progress Notes (Signed)
Established Patient Office Visit  Subjective   Patient ID: Danielle Hurley, female    DOB: Jan 24, 1961  Age: 61 y.o. MRN: [id number]  Chief Complaint  Patient presents with   Diabetes   Hypertension    HPI Danielle Hurley is a 61 y.o. female presenting today for follow up of hypertension, hyperlipidemia, diabetes.  She also notes that about a month ago she fell forward and hit her head on the wall.  Since then, she has had neck pain.  She has not been evaluated yet. Hypertension: She does not check her blood pressure at home.  Pt denies chest pain, SOB, dizziness, edema, syncope, fatigue or heart palpitations. Taking valsartan 160 mg daily, reports excellent compliance with treatment. Denies side effects. Hyperlipidemia: tolerating atorvastatin well with no myalgias or significant side effects, she notes that she has only been taking 10 mg daily.  The 10-year ASCVD risk score (Arnett DK, et al., 2019) is: 16.2% Diabetes: denies hypoglycemic events, wounds or sores that are not healing well, increased thirst or urination. Denies vision problems, eye exam due.  At last appointment, added metformin XR 1000 mg twice daily to regimen of glimepiride 4 mg twice daily and saxagliptin 5 mg daily.  Not currently checking glucose at home, needs a prescription for a new glucometer.  She admits that she eats a lots of carbohydrates and sugary foods.  Patient Active Problem List   Diagnosis Date Noted   Tobacco use disorder, severe, in sustained remission 07/21/2023   MDD (major depressive disorder) 06/02/2023   GAD (generalized anxiety disorder) 06/02/2023   Chronic left-sided low back pain with left-sided sciatica 07/16/2022   Restless leg syndrome 07/16/2022   Leg cramps 07/16/2022   Gastroesophageal reflux disease without esophagitis 05/31/2022   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   Overactive bladder 11/02/2021   Hepatic cirrhosis (HCC) 10/21/2021   Pulmonary nodule 08/19/2021   Body mass  index 27.0-27.9, adult 08/19/2021   TB of lung w/ cavitation 05/08/2021   Hypertension associated with diabetes (HCC) 03/31/2021   Hyperlipidemia associated with type 2 diabetes mellitus (HCC) 03/31/2021   History of active tuberculosis 03/31/2021   Chronic hepatitis C without hepatic coma (HCC) 03/31/2021   Chronic left shoulder pain 03/31/2021   Type 2 diabetes mellitus with hyperglycemia, without long-term current use of insulin (HCC) 03/31/2021    Past Surgical History:  Procedure Laterality Date   CHOLECYSTECTOMY  1998   COLONOSCOPY  03/22/2023   first colonoscopy   TUBAL LIGATION  01/1995   VIDEO BRONCHOSCOPY WITH ENDOBRONCHIAL NAVIGATION N/A 09/04/2021   Procedure: VIDEO BRONCHOSCOPY WITH ENDOBRONCHIAL NAVIGATION;  Surgeon: Loreli Slot, MD;  Location: [facility] OR;  Service: Thoracic;  Laterality: N/A;   Family History  Adopted: Yes  Problem Relation Age of Onset   Lung cancer Brother    Colon cancer Neg Hx    Stomach cancer Neg Hx    Esophageal cancer Neg Hx    Colon polyps Neg Hx    No Known Allergies Outpatient Medications Prior to Visit  Medication Sig   acetaminophen (TYLENOL) 325 MG tablet Take 650 mg by mouth every 6 (six) hours as needed.   aspirin-acetaminophen-caffeine (EXCEDRIN MIGRAINE) 250-250-65 MG tablet Take 1 tablet by mouth every 6 (six) hours as needed for headache.   atorvastatin (LIPITOR) 20 MG tablet Take 1 tablet (20 mg total) by mouth daily. (Patient taking differently: Take 10 mg by mouth daily.)   Bismuth Subsalicylate (KAOPECTATE) 262 MG TABS Take 262 mg by mouth daily as  needed (upset stomach).   blood glucose meter kit and supplies Dispense based on patient and insurance preference. Use up to four times daily as directed. (FOR ICD-10 E10.9, E11.9).   celecoxib (CELEBREX) 200 MG capsule TAKE 1 CAPSULE BY MOUTH TWICE DAILY AS NEEDED   cetirizine (ZYRTEC) 10 MG tablet Take 10 mg by mouth daily as needed for allergies.   Cholecalciferol (VITAMIN  D) 50 MCG (2000 UT) tablet Take 2,000 Units by mouth daily.   FLUoxetine (PROZAC) 10 MG capsule Take 1 capsule (10 mg total) by mouth daily.   glimepiride (AMARYL) 4 MG tablet Take 1 tablet (4 mg total) by mouth 2 (two) times daily.   guaifenesin (HUMIBID E) 400 MG TABS tablet Take 400 mg by mouth daily as needed.   metFORMIN (GLUCOPHAGE-XR) 500 MG 24 hr tablet Take 1 tablet (500 mg total) by mouth 2 (two) times daily with a meal.   mupirocin ointment (BACTROBAN) 2 % Apply small amount to effected area twice daily for 2 weeks then as needed   oxybutynin (DITROPAN-XL) 10 MG 24 hr tablet TAKE 1 TABLET BY MOUTH AT BEDTIME   pantoprazole (PROTONIX) 40 MG tablet Take 1 tablet by mouth once daily   [DISCONTINUED] saxagliptin HCl (ONGLYZA) 5 MG TABS tablet TAKE 1 TABLET BY MOUTH ONCE DAILY STOP JANUVIA   [DISCONTINUED] valsartan (DIOVAN) 160 MG tablet Take 1 tablet by mouth once daily   [DISCONTINUED] calcium carbonate (TUMS - DOSED IN MG ELEMENTAL CALCIUM) 500 MG chewable tablet Chew 1-2 tablets by mouth daily as needed for indigestion or heartburn. (Patient not taking: Reported on 03/22/2023)   [DISCONTINUED] fluconazole (DIFLUCAN) 100 MG tablet Take 1 tablet (100 mg total) by mouth daily. (Patient not taking: Reported on 07/21/2023)   [DISCONTINUED] glucose blood (TRUETRACK TEST) test strip Bloodsugar testing QD and prn (Patient not taking: Reported on 03/22/2023)   [DISCONTINUED] rOPINIRole (REQUIP) 0.5 MG tablet Take 1-2 tablets (0.5-1 mg total) by mouth at bedtime. (Patient not taking: Reported on 06/02/2023)   No facility-administered medications prior to visit.   Social History   Tobacco Use   Smoking status: Former    Current packs/day: 0.00    Average packs/day: 1 pack/day for 40.0 years (40.0 ttl pk-yrs)    Types: Cigarettes    Start date: 4    Quit date: 2014    Years since quitting: 10.7   Smokeless tobacco: Never  Vaping Use   Vaping status: Never Used  Substance Use Topics    Alcohol use: Not Currently    Comment: none since hep c dx   Drug use: Not Currently    Types: Marijuana, Cocaine, Methamphetamines, IV    ROS Negative unless otherwise noted in HPI   Objective:     BP (!) 178/80 (BP Location: Left Arm, Patient Position: Sitting, Cuff Size: Normal)   Pulse 86   Resp 18   Ht 5\' 11"  (1.803 m)   Wt 209 lb (94.8 kg)   SpO2 95%   BMI 29.15 kg/m   Physical Exam Constitutional:      General: She is not in acute distress.    Appearance: Normal appearance.  HENT:     Head: Normocephalic and atraumatic.  Cardiovascular:     Rate and Rhythm: Normal rate and regular rhythm.     Heart sounds: No murmur heard.    No friction rub. No gallop.  Pulmonary:     Effort: Pulmonary effort is normal. No respiratory distress.     Breath sounds: No  wheezing, rhonchi or rales.  Musculoskeletal:        General: Normal range of motion.     Cervical back: Normal range of motion. Normal range of motion.  Skin:    General: Skin is warm and dry.  Neurological:     Mental Status: She is alert and oriented to person, place, and time.     Assessment & Plan:  Hypertension associated with diabetes (HCC) Assessment & Plan: BP goal <130/80.  Blood pressure elevated 183/80 initially, on recheck 178/80.  Increase to valsartan 320 mg daily.  In the future, if needed can initiate combination valsartan-hydrochlorothiazide pill for improved control.  Will continue to monitor.  Orders: -     Valsartan; Take 1 tablet (320 mg total) by mouth daily.  Dispense: 90 tablet; Refill: 3  Hyperlipidemia associated with type 2 diabetes mellitus (HCC) Assessment & Plan: Last lipid panel: LDL 67, HDL 39, triglycerides 155 which is a decrease from 171.  Continue atorvastatin 10 mg daily.  Will continue to monitor.   Type 2 diabetes mellitus with hyperglycemia, without long-term current use of insulin (HCC) Assessment & Plan: A1c increased 11.5 to 11.9.  Patient acknowledges that she  has a lot of room for improvement in her nutrition.  She has a sweet tooth and cannot seem to cut back on carbs and sugar.  Encouraged her to make even small changes like adding fiber or protein to her snack or meal that includes carbs to help balance blood sugar levels.  Continue glimepiride 4 mg twice daily, metformin 500 mg twice daily.  Discontinue saxagliptin, start Mounjaro 2.5 mg weekly.  Recheck A1c in 3 months.  Orders: -     Tirzepatide; Inject 2.5 mg into the skin once a week.  Dispense: 2 mL; Refill: 2 -     Blood Glucose Monitoring Suppl; 1 each by Does not apply route in the morning, at noon, and at bedtime. May substitute to any manufacturer covered by patient's insurance.  Dispense: 1 each; Refill: 0 -     Lancet Device; 1 each by Does not apply route in the morning, at noon, and at bedtime. May substitute to any manufacturer covered by patient's insurance.  Dispense: 1 each; Refill: 0 -     Lancets Misc.; 1 each by Does not apply route in the morning, at noon, and at bedtime. May substitute to any manufacturer covered by patient's insurance.  Dispense: 100 each; Refill: 0 -     TrueTrack Test; Bloodsugar testing QD and prn  Dispense: 100 each; Refill: 12  Screening mammogram for breast cancer -     Digital Screening Mammogram, Left and Right; Future  Need for influenza vaccination -     Flu vaccine trivalent PF, 6mos and older(Flulaval,Afluria,Fluarix,Fluzone)  Diabetes mellitus without complication (HCC) -     TrueTrack Test; Bloodsugar testing QD and prn  Dispense: 100 each; Refill: 12  Neck muscle spasm -     Cyclobenzaprine HCl; Take 1 tablet (5 mg total) by mouth 3 (three) times daily as needed for muscle spasms.  Dispense: 30 tablet; Refill: 1  Whiplash injury to neck, initial encounter -     Cyclobenzaprine HCl; Take 1 tablet (5 mg total) by mouth 3 (three) times daily as needed for muscle spasms.  Dispense: 30 tablet; Refill: 1  Advised to limit use of ibuprofen and  avoid taking it at the same time as Naprosyn or celecoxib.  Provided handout with gentle stretches and prescribing muscle relaxer.  Advised  her that neck injuries like whiplash can take quite some time to heal.  Patient verbalized understanding and is agreeable to this plan.  Return in about 3 months (around 11/10/2023) for follow-up for HTN, HLD, DM, fasting blood work 1 week before.    [name], PA

## 2023-08-11 NOTE — Patient Instructions (Addendum)
Once you start the weekly Mounjaro injections, you can stop taking the saxagliptin.  ADULT OTC PAIN MEDICATIONS: Ibuprofen (Advil, Midol, Motrin) -200 to 400 mg orally every 4 to 6 hours as needed -Maximum dose: 1200 mg/day (over-the-counter)  -STAY WELL HYDRATED TO REDUCE RISK OF KIDNEY DAMAGE -Increased risk of GI bleeds and kidney damage if taking too much or too often  Do not take ibuprofen at the same time as you are taking naproxen or Celebrex.   Get your eye exam in the next couple of months!

## 2023-08-11 NOTE — Assessment & Plan Note (Addendum)
Last lipid panel: LDL 67, HDL 39, triglycerides 155 which is a decrease from 171.  Continue atorvastatin 10 mg daily.  Will continue to monitor.

## 2023-08-11 NOTE — Assessment & Plan Note (Addendum)
BP goal <130/80.  Blood pressure elevated 183/80 initially, on recheck 178/80.  Increase to valsartan 320 mg daily.  In the future, if needed can initiate combination valsartan-hydrochlorothiazide pill for improved control.  Will continue to monitor.

## 2023-08-12 ENCOUNTER — Other Ambulatory Visit: Payer: Medicaid Other | Admitting: Licensed Clinical Social Worker

## 2023-08-12 NOTE — Patient Instructions (Signed)
Visit Information  Ms. Rosenbaum was given information about Medicaid Managed Care team care coordination services as a part of their [facility] Community Plan Medicaid benefit. Kurtis Elbaum verbally consented to engagement with the [facility] Managed Care team.   If you are experiencing a medical emergency, please call 911 or report to your local emergency department or urgent care.   If you have a non-emergency medical problem during routine business hours, please contact your provider's office and ask to speak with a nurse.   For questions related to your [facility], please call: [phone] or visit the homepage here: [email or web address]  If you would like to schedule transportation through your [facility], please call the following number at least 2 days in advance of your appointment: [phone]   Rides for urgent appointments can also be made after hours by calling Member Services.  Call the Behavioral Health Crisis Line at ([phone], at any time, 24 hours a day, 7 days a week. If you are in danger or need immediate medical attention call 911.  If you would like help to quit smoking, call 1-800-QUIT-NOW (361-391-7150) OR Espaol: 1-855-Djelo-Ya (0-630-160-1093) o para ms informacin haga clic aqu or Text READY to 235-573 to register via text  Following is a copy of your plan of care:  Care Plan : LCSW Plan of Care  Updates made by [name], LCSW since 08/12/2023 12:00 AM     Problem: Depression Identification (Depression)      Goal: Depressive Symptoms Identified   Note:   Priority: High  Timeframe:  Short-Range Goal Priority:  High Start Date:   04/30/23          Expected End Date:  ongoing                     Follow Up Date--09/14/23 at 9:15am  - keep 90 percent of scheduled appointments -consider counseling or psychiatry -consider bumping up  your self-care  -consider creating a stronger support network   Why is this important?             Combatting depression may take some time.            If you don't feel better right away, don't give up on your treatment plan.    Current barriers:   Chronic Mental Health needs related to depression, stress and caregiver stress. Patient requires Support, Education, Resources, Referrals, Advocacy, and Care Coordination, in order to meet Unmet Mental Health Needs. Patient will implement clinical interventions discussed today to decrease symptoms of depression and increase knowledge and/or ability of: coping skills. Mental Health Concerns and Social Isolation Patient lacks knowledge of available community counseling agencies and resources.  Clinical Goal(s): verbalize understanding of plan for management of Anxiety, Depression, and Stress and demonstrate a reduction in symptoms. Patient will connect with a provider for ongoing mental health treatment, increase coping skills, healthy habits, self-management skills, and stress reduction        Patient Goals/Self-Care Activities: Over the next 120 days Attend scheduled medical appointments Utilize healthy coping skills and supportive resources discussed Contact PCP with any questions or concerns Keep 90 percent of counseling appointments Call your insurance provider for more information about your Enhanced Benefits  Check out counseling resources provided  Begin personal counseling with LCSW, to reduce and manage symptoms of Depression and Stress, until well-established with mental health provider Accept all calls from representative with [facility] in an effort to  establish ongoing mental health counseling and supportive services. Incorporate into daily practice - relaxation techniques, deep breathing exercises, and mindfulness meditation strategies. Talk about feelings with friends, family members, spiritual advisor, etc. Contact LCSW directly  [phone]), if you have questions, need assistance, or if additional social work needs are identified between now and our next scheduled telephone outreach call. Call 988 for mental health hotline/crisis line if needed (24/7 available) Try techniques to reduce symptoms of anxiety/negative thinking (deep breathing, distraction, positive self talk, etc)  - develop a personal safety plan - develop a plan to deal with triggers like holidays, anniversaries - exercise at least 2 to 3 times per week - have a plan for how to handle bad days - journal feelings and what helps to feel better or worse - spend time or talk with others at least 2 to 3 times per week - watch for early signs of feeling worse - begin personal counseling - call and visit an old friend - check out volunteer opportunities - join a support group - laugh; watch a funny movie or comedian - learn and use visualization or guided imagery - perform a random act of kindness - practice relaxation or meditation daily - start or continue a personal journal - practice positive thinking and self-talk -continue with compliance of taking medication  -identify current effective and ineffective coping strategies.  -implement positive self-talk in care to increase self-esteem, confidence and feelings of control.  -consider alternative and complementary therapy approaches such as meditation, mindfulness or yoga.  -journaling, prayer, worship services, meditation or pastoral counseling.  -increase participation in pleasurable group activities such as hobbies, singing, sports or volunteering).  -consider the use of meditative movement therapy such as tai chi, yoga or qigong.  -start a regular daily exercise program based on tolerance, ability and patient choice to support positive thinking and activity    If you are experiencing a Mental Health or Behavioral Health Crisis or need someone to talk to, please call the Suicide and Crisis  Lifeline: 988    Patient Goals: Follow up goal     24- Hour Availability:    [facility]  [address] [place], [place] [phone] Crisis [phone]   [facility] [phone]  [place] [facility]  [phone]    [facility]  [phone] (after hours)   Therapeutic Alternative/Mobile Crisis   [phone]   [place] National Suicide Hotline  8067906050 Len Childs) [place] [address]   Call [phone] for mental health emergencies   [facility]  [phone]);  [place] and [employer]  [phone]); [place], [place], [place], [place], [place], [place], [place]    [place] [facility] for [facility] Residents For 24/7 walk-up access to mental health services for [facility] children (4+), adolescents and adults, please visit the [facility] located at [address] in [place], [place].  *[facility] also provides comprehensive outpatient behavioral health services in a variety of locations around the [place].  Connect With [place] [address] [place], [place] [address] HelpLine: [phone] or [phone]  Get Directions  Find Help 24/7 By Phone Call our 24-hour HelpLine at (404) 409-0517 or 701-420-9283 for immediate assistance for mental health and substance abuse issues.  Walk-In Help [place]: [facility] (Ages 4 and Up) [place]: Emergency Dept., [facility] Additional Resources National Hopeline Network: 1-800-SUICIDE The National Suicide Prevention Lifeline:  1-800-273-TALK     The following coping skill education was provided for stress relief and mental health management: "When your car dies or a deadline looms, how do you respond? Long-term, low-grade or acute stress takes a serious toll on your body and mind, so don't ignore feelings of  constant tension. Stress is a natural part of life. However, too much stress can harm our health, especially if it continues every day. This is chronic stress and can put you at risk for heart problems like heart disease and depression. Understand what's happening inside your body and learn simple coping skills to combat the negative impacts of everyday stressors.  Types of Stress There are two types of stress: Emotional - types of emotional stress are relationship problems, pressure at work, financial worries, experiencing discrimination or having a major life change. Physical - Examples of physical stress include being sick having pain, not sleeping well, recovery from an injury or having an alcohol and drug use disorder. Fight or Flight Sudden or ongoing stress activates your nervous system and floods your bloodstream with adrenaline and cortisol, two hormones that raise blood pressure, increase heart rate and spike blood sugar. These changes pitch your body into a fight or flight response. That enabled our ancestors to outrun saber-toothed tigers, and it's helpful today for situations like dodging a car accident. But most modern chronic stressors, such as finances or a challenging relationship, keep your body in that heightened state, which hurts your health. Effects of Too Much Stress If constantly under stress, most of [place] will eventually start to function less well.  Multiple studies link chronic stress to a higher risk of heart disease, stroke, depression, weight gain, memory loss and even premature death, so it's important to recognize the warning signals. Talk to your doctor about ways to manage stress if you're experiencing any of these symptoms: Prolonged periods of poor sleep. Regular, severe headaches. Unexplained weight loss or gain. Feelings of isolation, withdrawal or worthlessness. Constant anger and irritability. Loss of interest in activities. Constant worrying or obsessive  thinking. Excessive alcohol or drug use. Inability to concentrate.  10 Ways to Cope with Chronic Stress It's key to recognize stressful situations as they occur because it allows you to focus on managing how you react. We all need to know when to close our eyes and take a deep breath when we feel tension rising. Use these tips to prevent or reduce chronic stress. 1. Rebalance Work and Home All work and no play? If you're spending too much time at the office, intentionally put more dates in your calendar to enjoy time for fun, either alone or with others. 2. Get Regular Exercise Moving your body on a regular basis balances the nervous system and increases blood circulation, helping to flush out stress hormones. Even a daily 20-minute walk makes a difference. Any kind of exercise can lower stress and improve your mood ? just pick activities that you enjoy and make it a regular habit. 3. Eat Well and Limit Alcohol and Stimulants Alcohol, nicotine and caffeine may temporarily relieve stress but have negative health impacts and can make stress worse in the long run. Well-nourished bodies cope better, so start with a good breakfast, add more organic fruits and vegetables for a well-balanced diet, avoid processed foods and sugar, try herbal tea and drink more water. 4. Connect with Supportive People Talking face to face with another person releases hormones that reduce stress. Lean on those good listeners in your life. 5.  Carve Out Hobby Time Do you enjoy gardening, reading, listening to music or some other creative pursuit? Engage in activities that bring you pleasure and joy; research shows that reduces stress by almost half and lowers your heart rate, too. 6. Practice Meditation, Stress Reduction or Yoga Relaxation techniques activate a state of restfulness that counterbalances your body's fight-or-flight hormones. Even if this also means a 10-minute break in a long day: listen to music, read, go for a  walk in nature, do a hobby, take a bath or spend time with a friend. Also consider doing a mindfulness exercise or try a daily deep breathing or imagery practice. Deep Breathing Slow, calm and deep breathing can help you relax. Try these steps to focus on your breathing and repeat as needed. Find a comfortable position and close your eyes. Exhale and drop your shoulders. Breathe in through your nose; fill your lungs and then your belly. Think of relaxing your body, quieting your mind and becoming calm and peaceful. Breathe out slowly through your nose, relaxing your belly. Think of releasing tension, pain, worries or distress. Repeat steps three and four until you feel relaxed. Imagery This involves using your mind to excite the senses -- sound, vision, smell, taste and feeling. This may help ease your stress. Begin by getting comfortable and then do some slow breathing. Imagine a place you love being at. It could be somewhere from your childhood, somewhere you vacationed or just a place in your imagination. Feel how it is to be in the place you're imagining. Pay attention to the sounds, air, colors, and who is there with you. This is a place where you feel cared for and loved. All is well. You are safe. Take in all the smells, sounds, tastes and feelings. As you do, feel your body being nourished and healed. Feel the calm that surrounds you. Breathe in all the good. Breathe out any discomfort or tension. 7. Sleep Enough If you get less than seven to eight hours of sleep, your body won't tolerate stress as well as it could. If stress keeps you up at night, address the cause, and add extra meditation into your day to make up for the lost z's. Try to get seven to nine hours of sleep each night. Make a regular bedtime schedule. Keep your room dark and cool. Try to avoid computers, TV, cell phones and tablets before bed. 8. Bond with Connections You Enjoy Go out for a coffee with a friend, chat with a  neighbor, call a family member, visit with a clergy member, or even hang out with your pet. Clinical studies show that spending even a short time with a companion animal can cut anxiety levels almost in half. 9. Take a Vacation Getting away from it all can reset your stress tolerance by increasing your mental and emotional outlook, which makes you a happier, more productive person upon return. Leave your cellphone and laptop at home! 10. See a Counselor, Coach or Therapist If negative thoughts overwhelm your ability to make positive changes, it's time to seek professional help. Make an appointment today--your health and life are worth it."  Dickie La, BSW, MSW, [employer] Managed Medicaid LCSW [facility]  [facility] [email or web address] Phone: [phone]

## 2023-08-12 NOTE — Patient Outreach (Addendum)
Medicaid Managed Care Social Work Note  08/12/2023 Name:  Danielle Hurley MRN:  [id number] DOB:  [birth year]  Danielle Hurley is an 62 y.o. year old female who is a primary patient of Danielle Hurley, [place].  The Medicaid Managed Care Coordination team was consulted for assistance with:  Mental Health Counseling and Resources  Ms. Duchow was given information about Medicaid Managed Care Coordination team services today. [name] Patient agreed to services and verbal consent obtained.  Engaged with patient  for by telephone forfollow up visit in response to referral for case management and/or care coordination services.   Assessments/Interventions:  Review of past medical history, allergies, medications, health status, including review of consultants reports, laboratory and other test data, was performed as part of comprehensive evaluation and provision of chronic care management services.  SDOH: (Social Determinant of Health) assessments and interventions performed: SDOH Interventions    Flowsheet Row Patient Outreach Telephone from 08/12/2023 in [place] POPULATION HEALTH DEPARTMENT Patient Outreach Telephone from 07/29/2023 in [place] POPULATION HEALTH DEPARTMENT Patient Outreach Telephone from 07/09/2023 in [place] POPULATION HEALTH DEPARTMENT Patient Outreach Telephone from 06/18/2023 in [place] POPULATION HEALTH DEPARTMENT Patient Outreach Telephone from 06/04/2023 in [place] POPULATION HEALTH DEPARTMENT Patient Outreach Telephone from 05/28/2023 in  POPULATION HEALTH DEPARTMENT  SDOH Interventions        Stress Interventions Offered [employer], Provide Counseling Provide Counseling, Other (Comment) Offered [employer], Provide Counseling Offered [employer], Provide Counseling Offered [employer], Provide Counseling Offered [employer] [facility], Provide Counseling  Social Connections  Interventions -- -- Intervention Not Indicated -- -- --       Advanced Directives Status:  See Care Plan for related entries.  Care Plan                 No Known Allergies  Medications Reviewed Today   Medications were not reviewed in this encounter     Patient Active Problem List   Diagnosis Date Noted   Tobacco use disorder, severe, in sustained remission 07/21/2023   MDD (major depressive disorder) 06/02/2023   GAD (generalized anxiety disorder) 06/02/2023   Chronic left-sided low back pain with left-sided sciatica 07/16/2022   Restless leg syndrome 07/16/2022   Leg cramps 07/16/2022   Gastroesophageal reflux disease without esophagitis 05/31/2022   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   Overactive bladder 11/02/2021   Hepatic cirrhosis (HCC) 10/21/2021   Pulmonary nodule 08/19/2021   Body mass index 27.0-27.9, adult 08/19/2021   TB of lung w/ cavitation 05/08/2021   Hypertension associated with diabetes (HCC) 03/31/2021   Hyperlipidemia associated with type 2 diabetes mellitus (HCC) 03/31/2021   History of active tuberculosis 03/31/2021   Chronic hepatitis C without hepatic coma (HCC) 03/31/2021   Chronic left shoulder pain 03/31/2021   Type 2 diabetes mellitus with hyperglycemia, without long-term current use of insulin (HCC) 03/31/2021    Conditions to be addressed/monitored per PCP order:  Depression  Care Plan : LCSW Plan of Care  Updates made by [name], LCSW since 08/12/2023 12:00 AM     Problem: Depression Identification (Depression)      Goal: Depressive Symptoms Identified   Note:   Priority: High  Timeframe:  Short-Range Goal Priority:  High Start Date:   04/30/23          Expected End Date:  ongoing                     Follow  Up Date--09/14/23 at 9:15am  - keep 90 percent of scheduled appointments -consider counseling or psychiatry -consider bumping up your self-care  -consider creating a stronger support network   Why is  this important?             Combatting depression may take some time.            If you don't feel better right away, don't give up on your treatment plan.    Current barriers:   Chronic Mental Health needs related to depression, stress and caregiver stress. Patient requires Support, Education, Resources, Referrals, Advocacy, and Care Coordination, in order to meet Unmet Mental Health Needs. Patient will implement clinical interventions discussed today to decrease symptoms of depression and increase knowledge and/or ability of: coping skills. Mental Health Concerns and Social Isolation Patient lacks knowledge of available community counseling agencies and resources.  Clinical Goal(s): verbalize understanding of plan for management of Anxiety, Depression, and Stress and demonstrate a reduction in symptoms. Patient will connect with a provider for ongoing mental health treatment, increase coping skills, healthy habits, self-management skills, and stress reduction        Clinical Interventions:  Assessed patient's previous and current treatment, coping skills, support system and barriers to care. Patient provided hx. Patient is the primary caregiver of her deaf brother who resides with her. She reports that family came from [place] 3 years ago. Patient reports 3 or 4 adult kids live nearby. Verbalization of feelings encouraged, motivational interviewing employed Emotional support provided, positive coping strategies explored. Establishing healthy boundaries emphasized and healthy self-care education provided Patient was educated on available mental health resources within their area that accept Medicaid and offer counseling and psychiatry. Email sent to patient today with available mental health resources within her area that accept Medicaid and offer the services that she is interested in. Email included instructions for scheduling at [facility] as well as some crisis support resources and [facility]'s walk  in clinic hours Patient reports significant worsening anxiety and depression affecting her ability to function appropriately and carry out daily task. She reports having trouble getting out of the bed some days.  Patient receives strong support from spouse, MIL, and mentor LCSW provided education on relaxation techniques such as meditation, deep breathing, massage, grounding exercises or yoga that can activate the body's relaxation response and ease symptoms of stress and anxiety. LCSW ask that when pt is struggling with difficult emotions and racing thoughts that they start this relaxation response process. LCSW provided extensive education on healthy coping skills for anxiety. SW used active and reflective listening, validated patient's feelings/concerns, and provided emotional support. Patient will work on implementing appropriate self-care habits into their daily routine such as: staying positive, writing a gratitude list, drinking water, staying active around the house, taking their medications as prescribed, combating negative thoughts or emotions and staying connected with their family and friends. Positive reinforcement provided for this decision to work on this. LCSW provided education on healthy sleep hygiene and what that looks like. LCSW encouraged patient to implement a night time routine into their schedule that works best for them and that they are able to maintain. Advised patient to implement deep breathing/grounding/meditation/self-care exercises into their nightly routine to combat racing thoughts at night. LCSW encouraged patient to wake up at the same time each day, make their sleeping environment comfortable, exercise when able, to limit naps and to not eat or drink anything right before bed.  Motivational Interviewing employed Depression screen reviewed  PHQ2/  PHQ9 completed or reviewed  Mindfulness or Relaxation training provided Active listening / Reflection utilized  Advance  Care and HCPOA education provided Emotional Support Provided Problem Solving /Task Center strategies reviewed Provided psychoeducation for mental health needs  Provided brief CBT  Reviewed mental health medications and discussed importance of compliance:  Quality of sleep assessed & Sleep Hygiene techniques promoted  Participation in counseling encouraged  Verbalization of feelings encouraged  Suicidal Ideation/Homicidal Ideation assessed: Patient denies SI/HI  Review resources, discussed options and provided patient information about  Mental Health Resources Inter-disciplinary care team collaboration (see longitudinal plan of care) 05/12/23- Patient reviewed list and is agreeable to referral to [facility] as she wishes to stay within the [facility] network. She reports that they received some great health related news regarding her brother which has helped them reduce traveling back and forth to those necessary medical appointments. 05/28/23- Patient states that [facility] called her and she has scheduled both her psychiatry and counseling appointment. She reports that she psychiatry appointment is with a female and she prefers females providers but desired to get the earliest appointment for medication management. Patient denies any current crises at the time. She is agreeable to [facility] LCSW contacting her on 06/04/23 to follow up.and ensure mental health support was established. Update- Patient recently completed her initial psychiatry visit on 06/02/23. She reports that she was prescribed zoloft but she and her daughter as now hesitant about her taking this new medication. Patient as advised to contact [facility] and PCP office for further information/direction on new psychiatric medication. Patient was appreciative of call and support. Patient will start counseling at [facility] next month. Update- Patient reports that she has decided to discontinue zoloft because she did research on medication side effects and read that she is  unable to take tylenol with medication. [facility] LCSW strongly encouraged her to contact her provider at [facility] to follow up on this and to contact her prescriber for questions/concerns before she changes her psychiatric medications next time she decides to abruptly stop a medicine. Patient expressed understanding. Brief self-care and coping skill education provided. 07/09/23- Patient reports doing well but she is struggling with chronic lower back pain which triggers her stress and depression. Advised patient to exercise 10-20 minutes everyday, go outside in the sunlight everyday for at least 10-15 minutes, drink 6-8 glasses of water a day, take vitamins/medications as directed and try to increase sleep. Patient has her initial psychiatry and counseling appointments on 07/20/23 and 07/21/23 and reports that her daughter will transport her to these appointments. [facility] LCSW will follow up in three weeks. Update- Patient is now established with long term mental health providers at [facility]. Patient reports that she was put on Prozac and was told by her psychiatrist that she can still take ibuprofen with this but when she researched this online, she found differently. [facility] LCSW will message her psychiatrist inquiring about this concern. Patient looks forward to her next therapy appointment on 08/11/23. She reports no urgent matters at this time. Patient was educated on healthy self care and was encouraged to continue making a daily effort implementing these into her routine. 08/12/23- Patient reports that she had to cancel her therapy appointment yesterday because it was virtual and she continues to have connection and service issues where she resides so she rescheduled appointment for in person next month. Patient reports that she did not hear back from psychiatry regarding her concerns even after leaving a message. [facility] LCSW advised her to try and  contact [facility] again to follow up on this concern. Patient does not have a follow up with  psychiatry until December of 2024.   Patient Goals/Self-Care Activities: Over the next 120 days Attend scheduled medical appointments Utilize healthy coping skills and supportive resources discussed Contact PCP with any questions or concerns Keep 90 percent of counseling appointments Call your insurance provider for more information about your Enhanced Benefits  Check out counseling resources provided  Begin personal counseling with LCSW, to reduce and manage symptoms of Depression and Stress, until well-established with mental health provider Accept all calls from representative with [facility] in an effort to establish ongoing mental health counseling and supportive services. Incorporate into daily practice - relaxation techniques, deep breathing exercises, and mindfulness meditation strategies. Talk about feelings with friends, family members, spiritual advisor, etc. Contact LCSW directly [phone]), if you have questions, need assistance, or if additional social work needs are identified between now and our next scheduled telephone outreach call. Call 988 for mental health hotline/crisis line if needed (24/7 available) Try techniques to reduce symptoms of anxiety/negative thinking (deep breathing, distraction, positive self talk, etc)  - develop a personal safety plan - develop a plan to deal with triggers like holidays, anniversaries - exercise at least 2 to 3 times per week - have a plan for how to handle bad days - journal feelings and what helps to feel better or worse - spend time or talk with others at least 2 to 3 times per week - watch for early signs of feeling worse - begin personal counseling - call and visit an old friend - check out volunteer opportunities - join a support group - laugh; watch a funny movie or comedian - learn and use visualization or guided imagery - perform a random act of kindness - practice relaxation or meditation daily - start or continue a  personal journal - practice positive thinking and self-talk -continue with compliance of taking medication  -identify current effective and ineffective coping strategies.  -implement positive self-talk in care to increase self-esteem, confidence and feelings of control.  -consider alternative and complementary therapy approaches such as meditation, mindfulness or yoga.  -journaling, prayer, worship services, meditation or pastoral counseling.  -increase participation in pleasurable group activities such as hobbies, singing, sports or volunteering).  -consider the use of meditative movement therapy such as tai chi, yoga or qigong.  -start a regular daily exercise program based on tolerance, ability and patient choice to support positive thinking and activity    If you are experiencing a Mental Health or Behavioral Health Crisis or need someone to talk to, please call the Suicide and Crisis Lifeline: 988    Patient Goals: Follow up goal       08/11/2023    1:21 PM 07/20/2023   11:07 AM 06/02/2023    2:16 PM 04/30/2023   11:24 AM 03/04/2023    2:33 PM  Depression screen PHQ 2/9  Decreased Interest 2 2 3 2 1   Down, Depressed, Hopeless 1 1 2 2 1   PHQ - 2 Score 3 3 5 4 2   Altered sleeping 2 2 1 3 1   Tired, decreased energy 3 3 3 3 1   Change in appetite 3 2 3 2 1   Feeling bad or failure about yourself  1 1 1 2  0  Trouble concentrating 1 3 1  0 0  Moving slowly or fidgety/restless 2 2 1  0 0  Suicidal thoughts 0 0 0 0 0  PHQ-9 Score 15 16 15  14 5  Difficult doing work/chores Somewhat difficult Somewhat difficult Somewhat difficult Somewhat difficult    Follow up:  Patient agrees to Care Plan and Follow-up.  Plan: The Managed Medicaid care management team will reach out to the patient again over the next 30 days.  Dickie La, BSW, MSW, [employer] Managed Medicaid LCSW [facility]  [facility] [email or web address] Phone: [phone]

## 2023-08-13 ENCOUNTER — Telehealth: Payer: Self-pay

## 2023-08-13 DIAGNOSIS — E1165 Type 2 diabetes mellitus with hyperglycemia: Secondary | ICD-10-CM

## 2023-08-13 MED ORDER — TRULICITY 0.75 MG/0.5ML ~~LOC~~ SOAJ
0.7500 mg | SUBCUTANEOUS | 1 refills | Status: DC
Start: 2023-08-13 — End: 2023-10-22

## 2023-08-13 NOTE — Telephone Encounter (Signed)
Greggory Keen was denied by insurance.   We denied your request for:  Mounjaro Inj 2.5/0.5 Policy rules found at Clinical Coverage Policy 9, Outpatient Pharmacy Program guided our decision. Here are the policy requirements your request did not meet: Per your health plan's criteria, this drug is covered if you meet the following: You have tried two preferred drugs (or your doctor provides clinical reason why you cannot use the drugs): Byetta, Trulicity, Ozempic or Victoza.

## 2023-08-13 NOTE — Telephone Encounter (Signed)
Patient has been notified of prescription change. She was instructed to d/c saxagliptin. Patient verbalized understanding. All questions and concerns have been addressed.

## 2023-08-13 NOTE — Telephone Encounter (Signed)
Patient has previously failed Ozempic.  I will change the prescription from [facility] to the starting dose of Trulicity 0.75 mg weekly.

## 2023-08-18 ENCOUNTER — Other Ambulatory Visit: Payer: Self-pay | Admitting: Nurse Practitioner

## 2023-08-18 ENCOUNTER — Ambulatory Visit: Payer: Medicaid Other | Admitting: Podiatry

## 2023-08-18 DIAGNOSIS — N3281 Overactive bladder: Secondary | ICD-10-CM

## 2023-09-01 ENCOUNTER — Ambulatory Visit
Admission: RE | Admit: 2023-09-01 | Discharge: 2023-09-01 | Disposition: A | Discharge status: Home / Self Care | Payer: Medicaid Other | Source: Ambulatory Visit | Attending: Family Medicine

## 2023-09-01 DIAGNOSIS — Z1231 Encounter for screening mammogram for malignant neoplasm of breast: Secondary | ICD-10-CM

## 2023-09-03 ENCOUNTER — Telehealth (HOSPITAL_COMMUNITY): Payer: Self-pay

## 2023-09-06 ENCOUNTER — Other Ambulatory Visit (HOSPITAL_COMMUNITY): Payer: Medicaid Other

## 2023-09-06 ENCOUNTER — Telehealth (HOSPITAL_COMMUNITY): Payer: Self-pay | Admitting: Psychiatry

## 2023-09-06 NOTE — Telephone Encounter (Signed)
Brief Psych Note I called patient to discuss fluoxetine and its interactions with ibuprofen. Patient states she stopped it not because of the potential interaction, but because it was causing her to have headaches which she finds intolerable.  I discussed with patient the option to start a different agent such as mirtazapine to treat her depression and anxiety, but after hearing about the potential to cause weight gain, she says, "well I don't want that."  Before I could discuss other medication options, patient says, "can I call you back in a couple of hours? I need to talk to my brother's doctor." I informed patient I would likely not be here in the office to take her call, but that I could call her again tomorrow to discuss further. Patient agreed to this plan.  Signed: Bary Leriche, MD [facility] Health Physician 09/06/2023 2:30 PM

## 2023-09-07 ENCOUNTER — Telehealth: Payer: Self-pay | Admitting: Psychiatry

## 2023-09-07 NOTE — Telephone Encounter (Signed)
Brief Psychiatry Note Attempted to call patient x2 with no response. Will attempt again tomorrow.  Signed: Bary Leriche, MD [facility] Health Physician 09/07/2023 4:52 PM

## 2023-09-08 ENCOUNTER — Other Ambulatory Visit (HOSPITAL_COMMUNITY): Payer: Self-pay

## 2023-09-08 ENCOUNTER — Other Ambulatory Visit (HOSPITAL_COMMUNITY): Payer: Medicaid Other

## 2023-09-08 ENCOUNTER — Telehealth: Payer: Self-pay | Admitting: Psychiatry

## 2023-09-08 DIAGNOSIS — F329 Major depressive disorder, single episode, unspecified: Secondary | ICD-10-CM

## 2023-09-08 DIAGNOSIS — E1165 Type 2 diabetes mellitus with hyperglycemia: Secondary | ICD-10-CM

## 2023-09-08 DIAGNOSIS — L84 Corns and callosities: Secondary | ICD-10-CM

## 2023-09-08 MED ORDER — LAMICTAL 25 MG PO TABS: ORAL_TABLET | ORAL | 0 refills | Status: DC

## 2023-09-08 NOTE — Addendum Note (Signed)
Addended byAugusto Gamble on: 09/08/2023 05:16 PM   Modules accepted: Orders

## 2023-09-08 NOTE — Telephone Encounter (Addendum)
Brief Psych Note Spoke to patient regarding medication options. Discussed trial of lamotrigine to treat depressive symptoms - her main concern was "not gain weight more than I already have." Patient was amenable to trial this medication regimen.  Provided risk/benefits/side-effects discussion with patient. Specifically counseled patient to notify office and stop medication if she develops a rash or allergic reaction.  Signed: Bary Leriche, MD [facility] Health Physician 09/08/2023 4:32 PM

## 2023-09-08 NOTE — Progress Notes (Signed)
PATIENT PRESENTED TO THE OFFICE FOR LABS, LABS WERE DRAWN WITH NO ISSUE.

## 2023-09-09 ENCOUNTER — Telehealth: Payer: Self-pay

## 2023-09-09 MED ORDER — OXYBUTYNIN CHLORIDE ER 5 MG PO TB24: 5.0000 mg | ORAL_TABLET | Freq: Every day | ORAL | 3 refills | Status: DC

## 2023-09-09 NOTE — Addendum Note (Signed)
Addended by: Saralyn Pilar on: 09/09/2023 03:16 PM   Modules accepted: Orders

## 2023-09-09 NOTE — Telephone Encounter (Signed)
Meds ordered this encounter  Medications   oxybutynin (DITROPAN-XL) 5 MG 24 hr tablet    Sig: Take 1 tablet (5 mg total) by mouth daily.    Dispense:  90 tablet    Refill:  3    Order Specific Question:   Supervising Provider    Answer:   Nani Gasser D [[id number]]

## 2023-09-09 NOTE — Telephone Encounter (Signed)
Pt is calling to request reducing back to the 5mg  of oxybutynin (DITROPAN-XL).  The current dose is causing dry mouth and making it hard to urinate.   Pharmacy [id number] - [place] ([place]), [place] - [address]

## 2023-09-14 ENCOUNTER — Other Ambulatory Visit: Payer: Medicaid Other | Admitting: Licensed Clinical Social Worker

## 2023-09-14 NOTE — Patient Instructions (Signed)
Visit Information  Danielle Hurley was given information about Medicaid Managed Care team care coordination services as a part of their [facility] Medicaid benefit. Danielle Hurley verbally consented to engagement with the [facility] Managed Care team.   If you are experiencing a medical emergency, please call 911 or report to your local emergency department or urgent care.   If you have a non-emergency medical problem during routine business hours, please contact your provider's office and ask to speak with a nurse.   For questions related to your [facility], please call: [phone] or visit the homepage here: [email or web address]  If you would like to schedule transportation through your [facility], please call the following number at least 2 days in advance of your appointment: [phone]   Rides for urgent appointments can also be made after hours by calling Member Services.  Call the Behavioral Health Crisis Line at [phone], at any time, 24 hours a day, 7 days a week. If you are in danger or need immediate medical attention call 911.  If you would like help to quit smoking, call 1-800-QUIT-NOW (406-003-0188) OR Espaol: 1-855-Djelo-Ya (1-324-401-0272) o para ms informacin haga clic aqu or Text READY to 536-644 to register via text  Following is a copy of your plan of care:  Care Plan : LCSW Plan of Care  Updates made by [name], LCSW since 09/14/2023 12:00 AM     Problem: Depression Identification (Depression)      Goal: Depressive Symptoms Identified   Note:   Priority: High  Timeframe:  Short-Range Goal Priority:  High Start Date:   04/30/23          Expected End Date:  ongoing                     Follow Up Date--10/05/23 at 10 am  - keep 90 percent of scheduled appointments -consider counseling or psychiatry -consider bumping up  your self-care  -consider creating a stronger support network   Why is this important?             Combatting depression may take some time.            If you don't feel better right away, don't give up on your treatment plan.    Current barriers:   Chronic Mental Health needs related to depression, stress and caregiver stress. Patient requires Support, Education, Resources, Referrals, Advocacy, and Care Coordination, in order to meet Unmet Mental Health Needs. Patient will implement clinical interventions discussed today to decrease symptoms of depression and increase knowledge and/or ability of: coping skills. Mental Health Concerns and Social Isolation Patient lacks knowledge of available community counseling agencies and resources.  Clinical Goal(s): verbalize understanding of plan for management of Anxiety, Depression, and Stress and demonstrate a reduction in symptoms. Patient will connect with a provider for ongoing mental health treatment, increase coping skills, healthy habits, self-management skills, and stress reduction        Patient Goals/Self-Care Activities: Over the next 120 days Attend scheduled medical appointments Utilize healthy coping skills and supportive resources discussed Contact PCP with any questions or concerns Keep 90 percent of counseling appointments Call your insurance provider for more information about your Enhanced Benefits  Check out counseling resources provided  Begin personal counseling with LCSW, to reduce and manage symptoms of Depression and Stress, until well-established with mental health provider Accept all calls from representative with [facility] in an effort  to establish ongoing mental health counseling and supportive services. Incorporate into daily practice - relaxation techniques, deep breathing exercises, and mindfulness meditation strategies. Talk about feelings with friends, family members, spiritual advisor, etc. Contact LCSW directly  [phone]), if you have questions, need assistance, or if additional social work needs are identified between now and our next scheduled telephone outreach call. Call 988 for mental health hotline/crisis line if needed (24/7 available) Try techniques to reduce symptoms of anxiety/negative thinking (deep breathing, distraction, positive self talk, etc)  - develop a personal safety plan - develop a plan to deal with triggers like holidays, anniversaries - exercise at least 2 to 3 times per week - have a plan for how to handle bad days - journal feelings and what helps to feel better or worse - spend time or talk with others at least 2 to 3 times per week - watch for early signs of feeling worse - begin personal counseling - call and visit an old friend - check out volunteer opportunities - join a support group - laugh; watch a funny movie or comedian - learn and use visualization or guided imagery - perform a random act of kindness - practice relaxation or meditation daily - start or continue a personal journal - practice positive thinking and self-talk -continue with compliance of taking medication  -identify current effective and ineffective coping strategies.  -implement positive self-talk in care to increase self-esteem, confidence and feelings of control.  -consider alternative and complementary therapy approaches such as meditation, mindfulness or yoga.  -journaling, prayer, worship services, meditation or pastoral counseling.  -increase participation in pleasurable group activities such as hobbies, singing, sports or volunteering).  -consider the use of meditative movement therapy such as tai chi, yoga or qigong.  -start a regular daily exercise program based on tolerance, ability and patient choice to support positive thinking and activity    If you are experiencing a Mental Health or Behavioral Health Crisis or need someone to talk to, please call the Suicide and Crisis  Lifeline: 988    Patient Goals: Follow up goal     24- Hour Availability:    [facility]  [address] [place], [place] [phone] Crisis [phone]   [facility] the [facility] [phone]  [place] Crisis [facility]  [phone]    [facility]  [phone] (after hours)   Therapeutic Alternative/Mobile Crisis   [phone]   [place] National Suicide Hotline  920-003-3184 Len Child[place]) [place] [address]   Call (803)098-4031 for mental health emergencies   [facility]  (618)032-7477);  [place] and [employer]  (651) 690-2029); [place], [place], [place], [place], [place], [place], [place]    [place] [facility] for [facility] Residents For 24/7 walk-up access to mental health services for [facility] children (4+), adolescents and adults, please visit the [facility] located at [address] in [place], [place].  *[facility] also provides comprehensive outpatient behavioral health services in a variety of locations around the [place].  Connect With [place] [address] [place], [place] [address] HelpLine: 9095310660 or 1-847-727-9861  Get Directions  Find Help 24/7 By Phone Call our 24-hour HelpLine at 337-434-5413 or 279-347-3315 for immediate assistance for mental health and substance abuse issues.  Walk-In Help [place]: [facility] (Ages 4 and Up) [place]: Emergency Dept., [facility] Additional Resources National Hopeline Network: 1-800-SUICIDE The National Suicide Prevention  Lifeline: 8-657-846-NGEX      Dickie La, BSW, MSW, [employer] Managed Medicaid LCSW [facility]  [facility] [email or web address] Phone: [phone]

## 2023-09-14 NOTE — Patient Outreach (Addendum)
Medicaid Managed Care Social Work Note  09/14/2023 Name:  Danielle Hurley MRN:  [id number] DOB:  Aug 09, 1961  Danielle Hurley is an 62 y.o. year old female who is a primary patient of Danielle Hurley, [place].  The Medicaid Managed Care Coordination team was consulted for assistance with:  Mental Health Counseling and Resources  Danielle Hurley was given information about Medicaid Managed Care Coordination team services today. Danielle Hurley Patient agreed to services and verbal consent obtained.  Engaged with patient  for by telephone forfollow up visit in response to referral for case management and/or care coordination services.   Assessments/Interventions:  Review of past medical history, allergies, medications, health status, including review of consultants reports, laboratory and other test data, was performed as part of comprehensive evaluation and provision of chronic care management services.  SDOH: (Social Determinant of Health) assessments and interventions performed: SDOH Interventions    Flowsheet Row Patient Outreach Telephone from 09/14/2023 in [place] POPULATION HEALTH DEPARTMENT Patient Outreach Telephone from 08/12/2023 in [place] POPULATION HEALTH DEPARTMENT Patient Outreach Telephone from 07/29/2023 in [place] POPULATION HEALTH DEPARTMENT Patient Outreach Telephone from 07/09/2023 in [place] POPULATION HEALTH DEPARTMENT Patient Outreach Telephone from 06/18/2023 in [place] POPULATION HEALTH DEPARTMENT Patient Outreach Telephone from 06/04/2023 in [place] POPULATION HEALTH DEPARTMENT  SDOH Interventions        Stress Interventions Provide Counseling, Offered Community Wellness Resources  [Recent medication change] [employer], Provide Counseling Provide Counseling, Other (Comment) Offered [employer], Provide Counseling Offered [employer], Provide Counseling Offered [employer] Resources, Provide Counseling   Social Connections Interventions -- -- -- Intervention Not Indicated -- --       Advanced Directives Status:  See Care Plan for related entries.  Care Plan                 No Known Allergies  Medications Reviewed Today   Medications were not reviewed in this encounter     Patient Active Problem List   Diagnosis Date Noted   Tobacco use disorder, severe, in sustained remission 07/21/2023   MDD (major depressive disorder) 06/02/2023   GAD (generalized anxiety disorder) 06/02/2023   Chronic left-sided low back pain with left-sided sciatica 07/16/2022   Restless leg syndrome 07/16/2022   Leg cramps 07/16/2022   Gastroesophageal reflux disease without esophagitis 05/31/2022   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   Overactive bladder 11/02/2021   Hepatic cirrhosis (HCC) 10/21/2021   Pulmonary nodule 08/19/2021   Body mass index 27.0-27.9, adult 08/19/2021   TB of lung w/ cavitation 05/08/2021   Hypertension associated with diabetes (HCC) 03/31/2021   Hyperlipidemia associated with type 2 diabetes mellitus (HCC) 03/31/2021   History of active tuberculosis 03/31/2021   Chronic hepatitis C without hepatic coma (HCC) 03/31/2021   Chronic left shoulder pain 03/31/2021   Type 2 diabetes mellitus with hyperglycemia, without long-term current use of insulin (HCC) 03/31/2021    Conditions to be addressed/monitored per PCP order:  Depression  Care Plan : LCSW Plan of Care  Updates made by Danielle Bryant, LCSW since 09/14/2023 12:00 AM     Problem: Depression Identification (Depression)      Goal: Depressive Symptoms Identified   Note:   Priority: High  Timeframe:  Short-Range Goal Priority:  High Start Date:   04/30/23          Expected End Date:  ongoing  Follow Up Date--10/05/23 at 10 am  - keep 90 percent of scheduled appointments -consider counseling or psychiatry -consider bumping up your self-care  -consider creating a stronger support  network   Why is this important?             Combatting depression may take some time.            If you don't feel better right away, don't give up on your treatment plan.    Current barriers:   Chronic Mental Health needs related to depression, stress and caregiver stress. Patient requires Support, Education, Resources, Referrals, Advocacy, and Care Coordination, in order to meet Unmet Mental Health Needs. Patient will implement clinical interventions discussed today to decrease symptoms of depression and increase knowledge and/or ability of: coping skills. Mental Health Concerns and Social Isolation Patient lacks knowledge of available community counseling agencies and resources.  Clinical Goal(s): verbalize understanding of plan for management of Anxiety, Depression, and Stress and demonstrate a reduction in symptoms. Patient will connect with a provider for ongoing mental health treatment, increase coping skills, healthy habits, self-management skills, and stress reduction        Clinical Interventions:  Assessed patient's previous and current treatment, coping skills, support system and barriers to care. Patient provided hx. Patient is the primary caregiver of her deaf brother who resides with her. She reports that family came from [place] 3 years ago. Patient reports 3 or 4 adult kids live nearby. Verbalization of feelings encouraged, motivational interviewing employed Emotional support provided, positive coping strategies explored. Establishing healthy boundaries emphasized and healthy self-care education provided Patient was educated on available mental health resources within their area that accept Medicaid and offer counseling and psychiatry. Email sent to patient today with available mental health resources within her area that accept Medicaid and offer the services that she is interested in. Email included instructions for scheduling at [facility] as well as some crisis support  resources and [facility]'s walk in clinic hours Patient reports significant worsening anxiety and depression affecting her ability to function appropriately and carry out daily task. She reports having trouble getting out of the bed some days.  Patient receives strong support from spouse, MIL, and mentor LCSW provided education on relaxation techniques such as meditation, deep breathing, massage, grounding exercises or yoga that can activate the body's relaxation response and ease symptoms of stress and anxiety. LCSW ask that when pt is struggling with difficult emotions and racing thoughts that they start this relaxation response process. LCSW provided extensive education on healthy coping skills for anxiety. SW used active and reflective listening, validated patient's feelings/concerns, and provided emotional support. Patient will work on implementing appropriate self-care habits into their daily routine such as: staying positive, writing a gratitude list, drinking water, staying active around the house, taking their medications as prescribed, combating negative thoughts or emotions and staying connected with their family and friends. Positive reinforcement provided for this decision to work on this. LCSW provided education on healthy sleep hygiene and what that looks like. LCSW encouraged patient to implement a night time routine into their schedule that works best for them and that they are able to maintain. Advised patient to implement deep breathing/grounding/meditation/self-care exercises into their nightly routine to combat racing thoughts at night. LCSW encouraged patient to wake up at the same time each day, make their sleeping environment comfortable, exercise when able, to limit naps and to not eat or drink anything right before bed.  Motivational Interviewing employed Depression screen reviewed  PHQ2/ PHQ9 completed or reviewed  Mindfulness or Relaxation training provided Active listening /  Reflection utilized  Advance Care and HCPOA education provided Emotional Support Provided Problem Solving /Task Center strategies reviewed Provided psychoeducation for mental health needs  Provided brief CBT  Reviewed mental health medications and discussed importance of compliance:  Quality of sleep assessed & Sleep Hygiene techniques promoted  Participation in counseling encouraged  Verbalization of feelings encouraged  Suicidal Ideation/Homicidal Ideation assessed: Patient denies SI/HI  Review resources, discussed options and provided patient information about  Mental Health Resources Inter-disciplinary care team collaboration (see longitudinal plan of care) 05/12/23- Patient reviewed list and is agreeable to referral to [facility] as she wishes to stay within the [facility] network. She reports that they received some great health related news regarding her brother which has helped them reduce traveling back and forth to those necessary medical appointments. 05/28/23- Patient states that [facility] called her and she has scheduled both her psychiatry and counseling appointment. She reports that she psychiatry appointment is with a female and she prefers females providers but desired to get the earliest appointment for medication management. Patient denies any current crises at the time. She is agreeable to [facility] LCSW contacting her on 06/04/23 to follow up.and ensure mental health support was established. Update- Patient recently completed her initial psychiatry visit on 06/02/23. She reports that she was prescribed zoloft but she and her daughter as now hesitant about her taking this new medication. Patient as advised to contact [facility] and PCP office for further information/direction on new psychiatric medication. Patient was appreciative of call and support. Patient will start [facility] at [facility] next month. Update- Patient reports that she has decided to discontinue zoloft because she did research on medication  side effects and read that she is unable to take tylenol with medication. [facility] LCSW strongly encouraged her to contact her provider [facility] to follow up on this and to contact her prescriber for questions/concerns before she changes her psychiatric medications next time she decides to abruptly stop a medicine. Patient expressed understanding. Brief self-care and coping skill education provided. 07/09/23- Patient reports doing well but she is struggling with chronic lower back pain which triggers her stress and depression. Advised patient to exercise 10-20 minutes everyday, go outside in the sunlight everyday for at least 10-15 minutes, drink 6-8 glasses of water a day, take vitamins/medications as directed and try to increase sleep. Patient has her initial psychiatry and counseling appointments on 07/20/23 and 07/21/23 and reports that her daughter will transport her to these appointments. [employer] LCSW will follow up in three weeks. Update- Patient is now established with long term mental health providers at [facility]. Patient reports that she was put on Prozac and was told by her psychiatrist that she can still take ibuprofen with this but when she researched this online, she found differently. [facility] LCSW will message her psychiatrist inquiring about this concern. Patient looks forward to her next therapy appointment on 08/11/23. She reports no urgent matters at this time. Patient was educated on healthy self care and was encouraged to continue making a daily effort implementing these into her routine. Update- Patient contacted [facility] and spoke with her psychiatry and adjusted her medication successfully. Patient reports that dosage of the new medication is complicated but she is getting some assistance with her family. She was advised to continue to speak with her pharmacist and providers about any medication related concerns or questions she may have. Patient does not have a follow  up with counselor or psychiatry until December  of 2024. [facility] resent patient a list of BH resources within her area that accept her insurance per patient request.   Patient Goals/Self-Care Activities: Over the next 120 days Attend scheduled medical appointments Utilize healthy coping skills and supportive resources discussed Contact PCP with any questions or concerns Keep 90 percent of counseling appointments Call your insurance provider for more information about your Enhanced Benefits  Check out counseling resources provided  Begin personal counseling with LCSW, to reduce and manage symptoms of Depression and Stress, until well-established with mental health provider Accept all calls from representative with [facility] in an effort to establish ongoing mental health counseling and supportive services. Incorporate into daily practice - relaxation techniques, deep breathing exercises, and mindfulness meditation strategies. Talk about feelings with friends, family members, spiritual advisor, etc. Contact LCSW directly [phone]), if you have questions, need assistance, or if additional social work needs are identified between now and our next scheduled telephone outreach call. Call 988 for mental health hotline/crisis line if needed (24/7 available) Try techniques to reduce symptoms of anxiety/negative thinking (deep breathing, distraction, positive self talk, etc)  - develop a personal safety plan - develop a plan to deal with triggers like holidays, anniversaries - exercise at least 2 to 3 times per week - have a plan for how to handle bad days - journal feelings and what helps to feel better or worse - spend time or talk with others at least 2 to 3 times per week - watch for early signs of feeling worse - begin personal counseling - call and visit an old friend - check out volunteer opportunities - join a support group - laugh; watch a funny movie or comedian - learn and use visualization or guided imagery - perform a random act of  kindness - practice relaxation or meditation daily - start or continue a personal journal - practice positive thinking and self-talk -continue with compliance of taking medication  -identify current effective and ineffective coping strategies.  -implement positive self-talk in care to increase self-esteem, confidence and feelings of control.  -consider alternative and complementary therapy approaches such as meditation, mindfulness or yoga.  -journaling, prayer, worship services, meditation or pastoral counseling.  -increase participation in pleasurable group activities such as hobbies, singing, sports or volunteering).  -consider the use of meditative movement therapy such as tai chi, yoga or qigong.  -start a regular daily exercise program based on tolerance, ability and patient choice to support positive thinking and activity    If you are experiencing a Mental Health or Behavioral Health Crisis or need someone to talk to, please call the Suicide and Crisis Lifeline: 988    Patient Goals: Follow up goal     Follow up:  Patient agrees to Care Plan and Follow-up.  Plan: The Managed Medicaid care management team will reach out to the patient again over the next 30 days.  Dickie La, BSW, MSW, [employer] Managed Medicaid LCSW [facility]  [facility] [email or web address] Phone: [phone]

## 2023-09-15 ENCOUNTER — Other Ambulatory Visit: Payer: Self-pay | Admitting: Nurse Practitioner

## 2023-09-15 DIAGNOSIS — E1169 Type 2 diabetes mellitus with other specified complication: Secondary | ICD-10-CM

## 2023-09-15 NOTE — Telephone Encounter (Signed)
Sorry. This came to me and you are now listed as PCP.

## 2023-09-16 ENCOUNTER — Other Ambulatory Visit: Payer: Self-pay | Admitting: Family Medicine

## 2023-09-21 ENCOUNTER — Ambulatory Visit (HOSPITAL_COMMUNITY): Payer: Medicaid Other | Admitting: Mental Health

## 2023-09-29 ENCOUNTER — Other Ambulatory Visit (HOSPITAL_COMMUNITY): Payer: Medicaid Other

## 2023-10-03 ENCOUNTER — Other Ambulatory Visit: Payer: Self-pay | Admitting: Family Medicine

## 2023-10-03 DIAGNOSIS — K219 Gastro-esophageal reflux disease without esophagitis: Secondary | ICD-10-CM

## 2023-10-03 DIAGNOSIS — S134XXA Sprain of ligaments of cervical spine, initial encounter: Secondary | ICD-10-CM

## 2023-10-03 DIAGNOSIS — M62838 Other muscle spasm: Secondary | ICD-10-CM

## 2023-10-03 DIAGNOSIS — E1165 Type 2 diabetes mellitus with hyperglycemia: Secondary | ICD-10-CM

## 2023-10-05 ENCOUNTER — Other Ambulatory Visit: Payer: Self-pay | Admitting: Licensed Clinical Social Worker

## 2023-10-05 NOTE — Patient Instructions (Signed)
Visit Information  Danielle Hurley was given information about Medicaid Managed Care team care coordination services as a part of their [facility] Community Plan Medicaid benefit. Danielle Hurley  verbally agreed to engagement with the [facility] Managed Care team.  If you are experiencing a medical emergency, please call 911 or report to your local emergency department or urgent care.   If you have a non-emergency medical problem during routine business hours, please contact your provider's office and ask to speak with a nurse.   For questions related to your [facility], please call: [phone] or visit the homepage here: [email or web address]  If you would like to schedule transportation through your [facility], please call the following number at least 2 days in advance of your appointment: [phone]   Rides for urgent appointments can also be made after hours by calling Member Services.  Call the Behavioral Health Crisis Line at [phone], at any time, 24 hours a day, 7 days a week. If you are in danger or need immediate medical attention call 911.  If you would like help to quit smoking, call 1-800-QUIT-NOW (814-172-3017) OR Espaol: 1-855-Djelo-Ya (2-725-366-4403) o para ms informacin haga clic aqu or Text READY to 474-259 to register via text  Following is a copy of your plan of care:  Care Plan : LCSW Plan of Care  Updates made by [name], LCSW since 10/05/2023 12:00 AM     Problem: Depression Identification (Depression)      Goal: Depressive Symptoms Identified   Note:   Priority: High  Timeframe:  Short-Range Goal Priority:  High Start Date:   04/30/23          Expected End Date:  ongoing                     Follow Up Date--11/05/23 at 9 am  - keep 90 percent of scheduled appointments -consider counseling or psychiatry -consider bumping up your  self-care  -consider creating a stronger support network   Why is this important?             Combatting depression may take some time.            If you don't feel better right away, don't give up on your treatment plan.    Current barriers:   Chronic Mental Health needs related to depression, stress and caregiver stress. Patient requires Support, Education, Resources, Referrals, Advocacy, and Care Coordination, in order to meet Unmet Mental Health Needs. Patient will implement clinical interventions discussed today to decrease symptoms of depression and increase knowledge and/or ability of: coping skills. Mental Health Concerns and Social Isolation Patient lacks knowledge of available community counseling agencies and resources.  Clinical Goal(s): verbalize understanding of plan for management of Anxiety, Depression, and Stress and demonstrate a reduction in symptoms. Patient will connect with a provider for ongoing mental health treatment, increase coping skills, healthy habits, self-management skills, and stress reduction        Patient Goals/Self-Care Activities: Over the next 120 days Attend scheduled medical appointments Utilize healthy coping skills and supportive resources discussed Contact PCP with any questions or concerns Keep 90 percent of counseling appointments Call your insurance provider for more information about your Enhanced Benefits  Check out counseling resources provided  Begin personal counseling with LCSW, to reduce and manage symptoms of Depression and Stress, until well-established with mental health provider Accept all calls from representative with [facility] in an effort  to establish ongoing mental health counseling and supportive services. Incorporate into daily practice - relaxation techniques, deep breathing exercises, and mindfulness meditation strategies. Talk about feelings with friends, family members, spiritual advisor, etc. Contact LCSW directly  [phone]), if you have questions, need assistance, or if additional social work needs are identified between now and our next scheduled telephone outreach call. Call 988 for mental health hotline/crisis line if needed (24/7 available) Try techniques to reduce symptoms of anxiety/negative thinking (deep breathing, distraction, positive self talk, etc)  - develop a personal safety plan - develop a plan to deal with triggers like holidays, anniversaries - exercise at least 2 to 3 times per week - have a plan for how to handle bad days - journal feelings and what helps to feel better or worse - spend time or talk with others at least 2 to 3 times per week - watch for early signs of feeling worse - begin personal counseling - call and visit an old friend - check out volunteer opportunities - join a support group - laugh; watch a funny movie or comedian - learn and use visualization or guided imagery - perform a random act of kindness - practice relaxation or meditation daily - start or continue a personal journal - practice positive thinking and self-talk -continue with compliance of taking medication  -identify current effective and ineffective coping strategies.  -implement positive self-talk in care to increase self-esteem, confidence and feelings of control.  -consider alternative and complementary therapy approaches such as meditation, mindfulness or yoga.  -journaling, prayer, worship services, meditation or pastoral counseling.  -increase participation in pleasurable group activities such as hobbies, singing, sports or volunteering).  -consider the use of meditative movement therapy such as tai chi, yoga or qigong.  -start a regular daily exercise program based on tolerance, ability and patient choice to support positive thinking and activity    If you are experiencing a Mental Health or Behavioral Health Crisis or need someone to talk to, please call the Suicide and Crisis  Lifeline: 988    Patient Goals: Follow up goal     24- Hour Availability:    [facility]  [address] [place], [place] [phone] Crisis [phone]   [facility] [phone]  [place] [facility]  [phone]    [facility]  [phone] (after hours)   Therapeutic Alternative/Mobile Crisis   [phone]   [place] National Suicide Hotline  848-568-3265 [place]) [place] [address]   Call [phone] for mental health emergencies   [facility]  [phone]);  [place] and [employer]  [phone]); [place], [place], [place], [place], [place], [place], [place]    [facility] for [facility] Residents For 24/7 walk-up access to mental health services for [facility] children (4+), adolescents and adults, please visit the [facility] located at [address] in [place], [place].  * also provides comprehensive outpatient behavioral health services in a variety of locations around the [place].  Connect With [place] [address] [place], [place] [address] HelpLine: 412-444-8766 or 1-912-001-1671  Get Directions  Find Help 24/7 By Phone Call our 24-hour HelpLine at 629-880-0092 or 518-690-6992 for immediate assistance for mental health and substance abuse issues.  Walk-In Help [place]: [facility] (Ages 4 and Up) [place]: Emergency Dept., [facility] Additional Resources National Hopeline Network: 1-800-SUICIDE The National Suicide Prevention  Lifeline: 1-800-273-TALK     The following coping skill education was provided for stress relief and mental health management: "When your car dies or a deadline looms, how do you respond? Long-term, low-grade or acute stress takes a serious toll on your body and mind, so don't ignore feelings of  constant tension. Stress is a natural part of life. However, too much stress can harm our health, especially if it continues every day. This is chronic stress and can put you at risk for heart problems like heart disease and depression. Understand what's happening inside your body and learn simple coping skills to combat the negative impacts of everyday stressors.  Types of Stress There are two types of stress: Emotional - types of emotional stress are relationship problems, pressure at work, financial worries, experiencing discrimination or having a major life change. Physical - Examples of physical stress include being sick having pain, not sleeping well, recovery from an injury or having an alcohol and drug use disorder. Fight or Flight Sudden or ongoing stress activates your nervous system and floods your bloodstream with adrenaline and cortisol, two hormones that raise blood pressure, increase heart rate and spike blood sugar. These changes pitch your body into a fight or flight response. That enabled our ancestors to outrun saber-toothed tigers, and it's helpful today for situations like dodging a car accident. But most modern chronic stressors, such as finances or a challenging relationship, keep your body in that heightened state, which hurts your health. Effects of Too Much Stress If constantly under stress, most of [place] will eventually start to function less well.  Multiple studies link chronic stress to a higher risk of heart disease, stroke, depression, weight gain, memory loss and even premature death, so it's important to recognize the warning signals. Talk to your doctor about ways to manage stress if you're experiencing any of these symptoms: Prolonged periods of poor sleep. Regular, severe headaches. Unexplained weight loss or gain. Feelings of isolation, withdrawal or worthlessness. Constant anger and irritability. Loss of interest in activities. Constant worrying or obsessive  thinking. Excessive alcohol or drug use. Inability to concentrate.  10 Ways to Cope with Chronic Stress It's key to recognize stressful situations as they occur because it allows you to focus on managing how you react. We all need to know when to close our eyes and take a deep breath when we feel tension rising. Use these tips to prevent or reduce chronic stress. 1. Rebalance Work and Home All work and no play? If you're spending too much time at the office, intentionally put more dates in your calendar to enjoy time for fun, either alone or with others. 2. Get Regular Exercise Moving your body on a regular basis balances the nervous system and increases blood circulation, helping to flush out stress hormones. Even a daily 20-minute walk makes a difference. Any kind of exercise can lower stress and improve your mood ? just pick activities that you enjoy and make it a regular habit. 3. Eat Well and Limit Alcohol and Stimulants Alcohol, nicotine and caffeine may temporarily relieve stress but have negative health impacts and can make stress worse in the long run. Well-nourished bodies cope better, so start with a good breakfast, add more organic fruits and vegetables for a well-balanced diet, avoid processed foods and sugar, try herbal tea and drink more water. 4. Connect with Supportive People Talking face to face with another person releases hormones that reduce stress. Lean on those good listeners in your life.  5. Carve Out Hobby Time Do you enjoy gardening, reading, listening to music or some other creative pursuit? Engage in activities that bring you pleasure and joy; research shows that reduces stress by almost half and lowers your heart rate, too. 6. Practice Meditation, Stress Reduction or Yoga Relaxation techniques activate a state of restfulness that counterbalances your body's fight-or-flight hormones. Even if this also means a 10-minute break in a long day: listen to music, read, go for a  walk in nature, do a hobby, take a bath or spend time with a friend. Also consider doing a mindfulness exercise or try a daily deep breathing or imagery practice. Deep Breathing Slow, calm and deep breathing can help you relax. Try these steps to focus on your breathing and repeat as needed. Find a comfortable position and close your eyes. Exhale and drop your shoulders. Breathe in through your nose; fill your lungs and then your belly. Think of relaxing your body, quieting your mind and becoming calm and peaceful. Breathe out slowly through your nose, relaxing your belly. Think of releasing tension, pain, worries or distress. Repeat steps three and four until you feel relaxed. Imagery This involves using your mind to excite the senses -- sound, vision, smell, taste and feeling. This may help ease your stress. Begin by getting comfortable and then do some slow breathing. Imagine a place you love being at. It could be somewhere from your childhood, somewhere you vacationed or just a place in your imagination. Feel how it is to be in the place you're imagining. Pay attention to the sounds, air, colors, and who is there with you. This is a place where you feel cared for and loved. All is well. You are safe. Take in all the smells, sounds, tastes and feelings. As you do, feel your body being nourished and healed. Feel the calm that surrounds you. Breathe in all the good. Breathe out any discomfort or tension. 7. Sleep Enough If you get less than seven to eight hours of sleep, your body won't tolerate stress as well as it could. If stress keeps you up at night, address the cause, and add extra meditation into your day to make up for the lost z's. Try to get seven to nine hours of sleep each night. Make a regular bedtime schedule. Keep your room dark and cool. Try to avoid computers, TV, cell phones and tablets before bed. 8. Bond with Connections You Enjoy Go out for a coffee with a friend, chat with a  neighbor, call a family member, visit with a clergy member, or even hang out with your pet. Clinical studies show that spending even a short time with a companion animal can cut anxiety levels almost in half. 9. Take a Vacation Getting away from it all can reset your stress tolerance by increasing your mental and emotional outlook, which makes you a happier, more productive person upon return. Leave your cellphone and laptop at home! 10. See a Counselor, Coach or Therapist If negative thoughts overwhelm your ability to make positive changes, it's time to seek professional help. Make an appointment today--your health and life are worth it."  Dickie La, BSW, MSW, LCSW Licensed Clinical Social Worker [employer]   [facility] [email or web address] Direct Dial: [phone]

## 2023-10-05 NOTE — Patient Outreach (Signed)
Medicaid Managed Care Social Work Note  10/05/2023 Name:  Danielle Hurley MRN:  [id number] DOB:  [birth year]  Danielle Hurley is an 62 y.o. year old female who is a primary patient of [name], [place].  The Medicaid Managed Care Coordination team was consulted for assistance with:  Mental Health Counseling and Resources  [name] was given information about Medicaid Managed Care Coordination team services today. [name] Patient agreed to services and verbal consent obtained.  Engaged with patient  for by telephone forfollow up visit in response to referral for case management and/or care coordination services.   Assessments/Interventions:  Review of past medical history, allergies, medications, health status, including review of consultants reports, laboratory and other test data, was performed as part of comprehensive evaluation and provision of chronic care management services.  SDOH: (Social Determinant of Health) assessments and interventions performed: SDOH Interventions    Flowsheet Row Patient Outreach Telephone from 10/05/2023 in [place] POPULATION HEALTH DEPARTMENT Patient Outreach Telephone from 09/14/2023 in [place] POPULATION HEALTH DEPARTMENT Patient Outreach Telephone from 08/12/2023 in [place] POPULATION HEALTH DEPARTMENT Patient Outreach Telephone from 07/29/2023 in [place] POPULATION HEALTH DEPARTMENT Patient Outreach Telephone from 07/09/2023 in [place] POPULATION HEALTH DEPARTMENT Patient Outreach Telephone from 06/18/2023 in [place] POPULATION HEALTH DEPARTMENT  SDOH Interventions        Financial Strain Interventions Intervention Not Indicated -- -- -- -- --  Stress Interventions Offered [employer], Provide Counseling  [My meds are not working] Provide Counseling, Offered [employer] Resources  [Recent medication change] [employer], Provide Counseling Provide Counseling, Other (Comment) Offered  [employer], Provide Counseling Offered [employer], Provide Counseling  Social Connections Interventions -- -- -- -- Intervention Not Indicated --       Advanced Directives Status:  See Care Plan for related entries.  Care Plan                 No Known Allergies  Medications Reviewed Today   Medications were not reviewed in this encounter     Patient Active Problem List   Diagnosis Date Noted   Tobacco use disorder, severe, in sustained remission 07/21/2023   MDD (major depressive disorder) 06/02/2023   GAD (generalized anxiety disorder) 06/02/2023   Chronic left-sided low back pain with left-sided sciatica 07/16/2022   Restless leg syndrome 07/16/2022   Leg cramps 07/16/2022   Gastroesophageal reflux disease without esophagitis 05/31/2022   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   Overactive bladder 11/02/2021   Hepatic cirrhosis (HCC) 10/21/2021   Pulmonary nodule 08/19/2021   Body mass index 27.0-27.9, adult 08/19/2021   TB of lung w/ cavitation 05/08/2021   Hypertension associated with diabetes (HCC) 03/31/2021   Hyperlipidemia associated with type 2 diabetes mellitus (HCC) 03/31/2021   History of active tuberculosis 03/31/2021   Chronic hepatitis C without hepatic coma (HCC) 03/31/2021   Chronic left shoulder pain 03/31/2021   Type 2 diabetes mellitus with hyperglycemia, without long-term current use of insulin (HCC) 03/31/2021    Conditions to be addressed/monitored per PCP order:  Anxiety and Depression  Care Plan : LCSW Plan of Care  Updates made by [name], LCSW since 10/05/2023 12:00 AM     Problem: Depression Identification (Depression)      Goal: Depressive Symptoms Identified   Note:   Priority: High  Timeframe:  Short-Range Goal Priority:  High Start Date:   04/30/23          Expected End  Date:  ongoing                     Follow Up Date--11/05/23 at 9 am  - keep 90 percent of scheduled  appointments -consider counseling or psychiatry -consider bumping up your self-care  -consider creating a stronger support network   Why is this important?             Combatting depression may take some time.            If you don't feel better right away, don't give up on your treatment plan.    Current barriers:   Chronic Mental Health needs related to depression, stress and caregiver stress. Patient requires Support, Education, Resources, Referrals, Advocacy, and Care Coordination, in order to meet Unmet Mental Health Needs. Patient will implement clinical interventions discussed today to decrease symptoms of depression and increase knowledge and/or ability of: coping skills. Mental Health Concerns and Social Isolation Patient lacks knowledge of available community counseling agencies and resources.  Clinical Goal(s): verbalize understanding of plan for management of Anxiety, Depression, and Stress and demonstrate a reduction in symptoms. Patient will connect with a provider for ongoing mental health treatment, increase coping skills, healthy habits, self-management skills, and stress reduction        Clinical Interventions:  Assessed patient's previous and current treatment, coping skills, support system and barriers to care. Patient provided hx. Patient is the primary caregiver of her deaf brother who resides with her. She reports that family came from [place] 3 years ago. Patient reports 3 or 4 adult kids live nearby. Verbalization of feelings encouraged, motivational interviewing employed Emotional support provided, positive coping strategies explored. Establishing healthy boundaries emphasized and healthy self-care education provided Patient was educated on available mental health resources within their area that accept Medicaid and offer counseling and psychiatry. Email sent to patient today with available mental health resources within her area that accept Medicaid and offer the  services that she is interested in. Email included instructions for scheduling at [facility] as well as some crisis support resources and [facility]'s walk in clinic hours Patient reports significant worsening anxiety and depression affecting her ability to function appropriately and carry out daily task. She reports having trouble getting out of the bed some days.  Patient receives strong support from spouse, MIL, and mentor LCSW provided education on relaxation techniques such as meditation, deep breathing, massage, grounding exercises or yoga that can activate the body's relaxation response and ease symptoms of stress and anxiety. LCSW ask that when pt is struggling with difficult emotions and racing thoughts that they start this relaxation response process. LCSW provided extensive education on healthy coping skills for anxiety. SW used active and reflective listening, validated patient's feelings/concerns, and provided emotional support. Patient will work on implementing appropriate self-care habits into their daily routine such as: staying positive, writing a gratitude list, drinking water, staying active around the house, taking their medications as prescribed, combating negative thoughts or emotions and staying connected with their family and friends. Positive reinforcement provided for this decision to work on this. LCSW provided education on healthy sleep hygiene and what that looks like. LCSW encouraged patient to implement a night time routine into their schedule that works best for them and that they are able to maintain. Advised patient to implement deep breathing/grounding/meditation/self-care exercises into their nightly routine to combat racing thoughts at night. LCSW encouraged patient to wake up at the same time each day, make their sleeping environment comfortable,  exercise when able, to limit naps and to not eat or drink anything right before bed.  Motivational Interviewing employed Depression  screen reviewed  PHQ2/ PHQ9 completed or reviewed  Mindfulness or Relaxation training provided Active listening / Reflection utilized  Advance Care and HCPOA education provided Emotional Support Provided Problem Solving /Task Center strategies reviewed Provided psychoeducation for mental health needs  Provided brief CBT  Reviewed mental health medications and discussed importance of compliance:  Quality of sleep assessed & Sleep Hygiene techniques promoted  Participation in counseling encouraged  Verbalization of feelings encouraged  Suicidal Ideation/Homicidal Ideation assessed: Patient denies SI/HI  Review resources, discussed options and provided patient information about  Mental Health Resources Inter-disciplinary care team collaboration (see longitudinal plan of care) 05/12/23- Patient reviewed list and is agreeable to referral to [facility] as she wishes to stay within the [facility] network. She reports that they received some great health related news regarding her brother which has helped them reduce traveling back and forth to those necessary medical appointments. 05/28/23- Patient states that [facility] called her and she has scheduled both her psychiatry and counseling appointment. She reports that she psychiatry appointment is with a female and she prefers females providers but desired to get the earliest appointment for medication management. Patient denies any current crises at the time. She is agreeable to [facility] LCSW contacting her on 06/04/23 to follow up.and ensure mental health support was established. Update- Patient recently completed her initial psychiatry visit on 06/02/23. She reports that she was prescribed zoloft but she and her daughter as now hesitant about her taking this new medication. Patient as advised to contact [facility] and PCP office for further information/direction on new psychiatric medication. Patient was appreciative of call and support. Patient will start counseling at [facility] next  month. Update- Patient reports that she has decided to discontinue zoloft because she did research on medication side effects and read that she is unable to take tylenol with medication. [facility] LCSW strongly encouraged her to contact her provider at [facility] to follow up on this and to contact her prescriber for questions/concerns before she changes her psychiatric medications next time she decides to abruptly stop a medicine. Patient expressed understanding. Brief self-care and coping skill education provided. 07/09/23- Patient reports doing well but she is struggling with chronic lower back pain which triggers her stress and depression. Advised patient to exercise 10-20 minutes everyday, go outside in the sunlight everyday for at least 10-15 minutes, drink 6-8 glasses of water a day, take vitamins/medications as directed and try to increase sleep. Patient has her initial psychiatry and counseling appointments on 07/20/23 and 07/21/23 and reports that her daughter will transport her to these appointments. [facility] LCSW will follow up in three weeks. Update- Patient is now established with long term mental health providers at [facility]. Patient reports that she was put on Prozac and was told by her psychiatrist that she can still take ibuprofen with this but when she researched this online, she found differently. [facility] LCSW will message her psychiatrist inquiring about this concern. Patient looks forward to her next therapy appointment on 08/11/23. She reports no urgent matters at this time. Patient was educated on healthy self care and was encouraged to continue making a daily effort implementing these into her routine. Update- Patient contacted [facility] and spoke with her psychiatry and adjusted her medication successfully. Patient reports that dosage of the new medication is complicated but she is getting some assistance with her family. She was advised to  continue to speak with her pharmacist and providers about any medication related  concerns or questions she may have. Patient does not have a follow up with counselor or psychiatry until December of 2024. [facility] resent patient a list of BH resources within her area that accept her insurance per patient request. 10/05/23 Update- Patient reports not liking her new SSRI and has already discontinued medication without notifying her psychiatrist. Patient agreeable to contact provider today to discuss gaining a new psychotropic medication to treat her symptoms. Patient was provided vision resources for [facility]  per patient request. Patient was provided brief coping skill education on anxiety and depression management. Patient was advised to contact her PCP as well because she is having issues with getting her pharmacy to approve her prescription for blood strips.   Patient Goals/Self-Care Activities: Over the next 120 days Attend scheduled medical appointments Utilize healthy coping skills and supportive resources discussed Contact PCP with any questions or concerns Keep 90 percent of counseling appointments Call your insurance provider for more information about your Enhanced Benefits  Check out counseling resources provided  Begin personal counseling with LCSW, to reduce and manage symptoms of Depression and Stress, until well-established with mental health provider Accept all calls from representative with [facility] in an effort to establish ongoing mental health counseling and supportive services. Incorporate into daily practice - relaxation techniques, deep breathing exercises, and mindfulness meditation strategies. Talk about feelings with friends, family members, spiritual advisor, etc. Contact LCSW directly [phone]), if you have questions, need assistance, or if additional social work needs are identified between now and our next scheduled telephone outreach call. Call 988 for mental health hotline/crisis line if needed (24/7 available) Try techniques to reduce symptoms of  anxiety/negative thinking (deep breathing, distraction, positive self talk, etc)  - develop a personal safety plan - develop a plan to deal with triggers like holidays, anniversaries - exercise at least 2 to 3 times per week - have a plan for how to handle bad days - journal feelings and what helps to feel better or worse - spend time or talk with others at least 2 to 3 times per week - watch for early signs of feeling worse - begin personal counseling - call and visit an old friend - check out volunteer opportunities - join a support group - laugh; watch a funny movie or comedian - learn and use visualization or guided imagery - perform a random act of kindness - practice relaxation or meditation daily - start or continue a personal journal - practice positive thinking and self-talk -continue with compliance of taking medication  -identify current effective and ineffective coping strategies.  -implement positive self-talk in care to increase self-esteem, confidence and feelings of control.  -consider alternative and complementary therapy approaches such as meditation, mindfulness or yoga.  -journaling, prayer, worship services, meditation or pastoral counseling.  -increase participation in pleasurable group activities such as hobbies, singing, sports or volunteering).  -consider the use of meditative movement therapy such as tai chi, yoga or qigong.  -start a regular daily exercise program based on tolerance, ability and patient choice to support positive thinking and activity    If you are experiencing a Mental Health or Behavioral Health Crisis or need someone to talk to, please call the Suicide and Crisis Lifeline: 988    Patient Goals: Follow up goal     Follow up:  Patient agrees to Care Plan and Follow-up.  Plan: The Managed Medicaid care management team will reach out  to the patient again over the next 30 days.  Dickie La, BSW, MSW, LCSW Licensed Clinical Social  Worker [employer]   [facility] [email or web address] Direct Dial: [phone]

## 2023-10-11 ENCOUNTER — Other Ambulatory Visit: Payer: Self-pay | Admitting: Family Medicine

## 2023-10-11 DIAGNOSIS — E1165 Type 2 diabetes mellitus with hyperglycemia: Secondary | ICD-10-CM

## 2023-10-16 ENCOUNTER — Other Ambulatory Visit: Payer: Self-pay | Admitting: Family Medicine

## 2023-10-19 ENCOUNTER — Other Ambulatory Visit: Payer: Self-pay

## 2023-10-19 DIAGNOSIS — E1165 Type 2 diabetes mellitus with hyperglycemia: Secondary | ICD-10-CM

## 2023-10-19 DIAGNOSIS — E1169 Type 2 diabetes mellitus with other specified complication: Secondary | ICD-10-CM

## 2023-10-19 DIAGNOSIS — I152 Hypertension secondary to endocrine disorders: Secondary | ICD-10-CM

## 2023-10-21 ENCOUNTER — Telehealth: Payer: Self-pay | Admitting: *Deleted

## 2023-10-21 DIAGNOSIS — E1165 Type 2 diabetes mellitus with hyperglycemia: Secondary | ICD-10-CM

## 2023-10-21 NOTE — Telephone Encounter (Signed)
Pt calling to see about a refill on her saxagliptin, I told her I did not see it on her current med list and she said that the provider was going to take her off of that and put her on trulicity, however she never got the trulicity.  Please advise.

## 2023-10-22 MED ORDER — TRULICITY 0.75 MG/0.5ML ~~LOC~~ SOAJ: 0.7500 mg | SUBCUTANEOUS | 1 refills | Status: DC

## 2023-10-22 NOTE — Telephone Encounter (Signed)
Pt informed of below.

## 2023-10-22 NOTE — Telephone Encounter (Signed)
It looks like the Trulicity was sent to [place] on [address] on 08/13/2023.  I will resend the prescription today, please have her call us if it is not filled which I would guess is likely due to supply issues.

## 2023-10-22 NOTE — Addendum Note (Signed)
Addended by: Saralyn Pilar on: 10/22/2023 08:00 AM   Modules accepted: Orders

## 2023-11-01 NOTE — Telephone Encounter (Signed)
Copied from CRM [id number]. Topic: Clinical - Medication Question >> Nov 01, 2023  9:40 AM Dondra Prader A wrote: Reason for CRM: Pt called stating that she called the pharmacy and they stated that the prescription is now ready. I asked the Pt if they told her the med was on back order or not ready for pick up at the moment. I advise Pt to call pharmacy back for clarification.

## 2023-11-02 ENCOUNTER — Ambulatory Visit (HOSPITAL_COMMUNITY): Payer: Medicaid Other | Admitting: Mental Health

## 2023-11-03 ENCOUNTER — Other Ambulatory Visit: Payer: Medicaid Other

## 2023-11-03 ENCOUNTER — Encounter (HOSPITAL_COMMUNITY): Payer: Medicaid Other | Admitting: Psychiatry

## 2023-11-05 ENCOUNTER — Other Ambulatory Visit: Payer: Self-pay | Admitting: Licensed Clinical Social Worker

## 2023-11-05 NOTE — Patient Outreach (Signed)
Medicaid Managed Care Social Work Note  11/05/2023 Name:  Danielle Hurley MRN:  [id number] DOB:  October 08, 1961  Danielle Hurley is an 62 y.o. year old female who is a primary patient of [name], [place].  The Medicaid Managed Care Coordination team was consulted for assistance with:  Mental Health Counseling and Resources Grief Counseling  Ms. Dold was given information about Medicaid Managed Care Coordination team services today. [name] Patient agreed to services and verbal consent obtained.  Engaged with patient  for by telephone forfollow up visit in response to referral for case management and/or care coordination services.   Assessments/Interventions:  Review of past medical history, allergies, medications, health status, including review of consultants reports, laboratory and other test data, was performed as part of comprehensive evaluation and provision of chronic care management services.  SDOH: (Social Determinant of Health) assessments and interventions performed: SDOH Interventions    Flowsheet Row Patient Outreach Telephone from 11/05/2023 in [place] POPULATION HEALTH DEPARTMENT Patient Outreach Telephone from 10/05/2023 in [place] POPULATION HEALTH DEPARTMENT Patient Outreach Telephone from 09/14/2023 in [place] POPULATION HEALTH DEPARTMENT Patient Outreach Telephone from 08/12/2023 in [place] POPULATION HEALTH DEPARTMENT Patient Outreach Telephone from 07/29/2023 in [place] POPULATION HEALTH DEPARTMENT Patient Outreach Telephone from 07/09/2023 in [place] POPULATION HEALTH DEPARTMENT  SDOH Interventions        Housing Interventions Other (Comment)  [Housing Resources have been provided] -- -- -- -- --  Surveyor, quantity Strain Interventions -- Intervention Not Indicated -- -- -- --  Stress Interventions Provide Counseling, Offered [employer]  [Brother passed away recently in a tragic accident] [employer], Provide  Counseling  [My meds are not working] Provide Counseling, Offered [employer]  [Recent medication change] [employer], Provide Counseling Provide Counseling, Other (Comment) Offered [employer], Provide Counseling  Social Connections Interventions -- -- -- -- -- Intervention Not Indicated       Advanced Directives Status:  See Care Plan for related entries.  Care Plan                 No Known Allergies  Medications Reviewed Today   Medications were not reviewed in this encounter     Patient Active Problem List   Diagnosis Date Noted   Tobacco use disorder, severe, in sustained remission 07/21/2023   MDD (major depressive disorder) 06/02/2023   GAD (generalized anxiety disorder) 06/02/2023   Chronic left-sided low back pain with left-sided sciatica 07/16/2022   Restless leg syndrome 07/16/2022   Leg cramps 07/16/2022   Gastroesophageal reflux disease without esophagitis 05/31/2022   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   Overactive bladder 11/02/2021   Hepatic cirrhosis (HCC) 10/21/2021   Pulmonary nodule 08/19/2021   Body mass index 27.0-27.9, adult 08/19/2021   TB of lung w/ cavitation 05/08/2021   Hypertension associated with diabetes (HCC) 03/31/2021   Hyperlipidemia associated with type 2 diabetes mellitus (HCC) 03/31/2021   History of active tuberculosis 03/31/2021   Chronic hepatitis C without hepatic coma (HCC) 03/31/2021   Chronic left shoulder pain 03/31/2021   Type 2 diabetes mellitus with hyperglycemia, without long-term current use of insulin (HCC) 03/31/2021    Conditions to be addressed/monitored per PCP order:  Depression  Care Plan : LCSW Plan of Care  Updates made by [name], LCSW since 11/05/2023 12:00 AM     Problem: Depression Identification (Depression)      Goal: Depressive Symptoms Identified   Note:   Priority:  High  Timeframe:  Short-Range Goal Priority:  High Start  Date:   04/30/23          Expected End Date:  ongoing                     Follow Up Date--12/09/23 at 9:15 am  - keep 90 percent of scheduled appointments -consider counseling or psychiatry -consider bumping up your self-care  -consider creating a stronger support network   Why is this important?             Combatting depression may take some time.            If you don't feel better right away, don't give up on your treatment plan.    Current barriers:   Chronic Mental Health needs related to depression, stress and caregiver stress. Patient requires Support, Education, Resources, Referrals, Advocacy, and Care Coordination, in order to meet Unmet Mental Health Needs. Patient will implement clinical interventions discussed today to decrease symptoms of depression and increase knowledge and/or ability of: coping skills. Mental Health Concerns and Social Isolation Patient lacks knowledge of available community counseling agencies and resources.  Clinical Goal(s): verbalize understanding of plan for management of Anxiety, Depression, and Stress and demonstrate a reduction in symptoms. Patient will connect with a provider for ongoing mental health treatment, increase coping skills, healthy habits, self-management skills, and stress reduction        Clinical Interventions:  Assessed patient's previous and current treatment, coping skills, support system and barriers to care. Patient provided hx. Patient is the primary caregiver of her deaf brother who resides with her. She reports that family came from [place] 3 years ago. Patient reports 3 or 4 adult kids live nearby. Verbalization of feelings encouraged, motivational interviewing employed Emotional support provided, positive coping strategies explored. Establishing healthy boundaries emphasized and healthy self-care education provided Patient was educated on available mental health resources within their area that accept Medicaid and offer  counseling and psychiatry. Email sent to patient today with available mental health resources within her area that accept Medicaid and offer the services that she is interested in. Email included instructions for scheduling at [facility] as well as some crisis support resources and [facility]'s walk in clinic hours Patient reports significant worsening anxiety and depression affecting her ability to function appropriately and carry out daily task. She reports having trouble getting out of the bed some days.  Patient receives strong support from spouse, MIL, and mentor LCSW provided education on relaxation techniques such as meditation, deep breathing, massage, grounding exercises or yoga that can activate the body's relaxation response and ease symptoms of stress and anxiety. LCSW ask that when pt is struggling with difficult emotions and racing thoughts that they start this relaxation response process. LCSW provided extensive education on healthy coping skills for anxiety. SW used active and reflective listening, validated patient's feelings/concerns, and provided emotional support. Patient will work on implementing appropriate self-care habits into their daily routine such as: staying positive, writing a gratitude list, drinking water, staying active around the house, taking their medications as prescribed, combating negative thoughts or emotions and staying connected with their family and friends. Positive reinforcement provided for this decision to work on this. LCSW provided education on healthy sleep hygiene and what that looks like. LCSW encouraged patient to implement a night time routine into their schedule that works best for them and that they are able to maintain. Advised patient to implement deep breathing/grounding/meditation/self-care exercises into their  nightly routine to combat racing thoughts at night. LCSW encouraged patient to wake up at the same time each day, make their sleeping environment  comfortable, exercise when able, to limit naps and to not eat or drink anything right before bed.  Motivational Interviewing employed Depression screen reviewed  PHQ2/ PHQ9 completed or reviewed  Mindfulness or Relaxation training provided Active listening / Reflection utilized  Advance Care and HCPOA education provided Emotional Support Provided Problem Solving /Task Center strategies reviewed Provided psychoeducation for mental health needs  Provided brief CBT  Reviewed mental health medications and discussed importance of compliance:  Quality of sleep assessed & Sleep Hygiene techniques promoted  Participation in counseling encouraged  Verbalization of feelings encouraged  Suicidal Ideation/Homicidal Ideation assessed: Patient denies SI/HI  Review resources, discussed options and provided patient information about  Mental Health Resources Inter-disciplinary care team collaboration (see longitudinal plan of care) 05/12/23- Patient reviewed list and is agreeable to referral to [facility] as she wishes to stay within the [facility] network. She reports that they received some great health related news regarding her brother which has helped them reduce traveling back and forth to those necessary medical appointments. 05/28/23- Patient states that [facility] called her and she has scheduled both her psychiatry and counseling appointment. She reports that she psychiatry appointment is with a female and she prefers females providers but desired to get the earliest appointment for medication management. Patient denies any current crises at the time. She is agreeable to [facility] LCSW contacting her on 06/04/23 to follow up.and ensure mental health support was established. Update- Patient recently completed her initial psychiatry visit on 06/02/23. She reports that she was prescribed zoloft but she and her daughter as now hesitant about her taking this new medication. Patient as advised to contact [facility] and PCP office for  further information/direction on new psychiatric medication. Patient was appreciative of call and support. Patient will start counseling at [facility] next month. Update- Patient reports that she has decided to discontinue zoloft because she did research on medication side effects and read that she is unable to take tylenol with medication. [facility] LCSW strongly encouraged her to contact her provider at [facility] to follow up on this and to contact her prescriber for questions/concerns before she changes her psychiatric medications next time she decides to abruptly stop a medicine. Patient expressed understanding. Brief self-care and coping skill education provided. 07/09/23- Patient reports doing well but she is struggling with chronic lower back pain which triggers her stress and depression. Advised patient to exercise 10-20 minutes everyday, go outside in the sunlight everyday for at least 10-15 minutes, drink 6-8 glasses of water a day, take vitamins/medications as directed and try to increase sleep. Patient has her initial psychiatry and counseling appointments on 07/20/23 and 07/21/23 and reports that her daughter will transport her to these appointments. [facility] LCSW will follow up in three weeks. Update- Patient is now established with long term mental health providers at [facility]. Patient reports that she was put on Prozac and was told by her psychiatrist that she can still take ibuprofen with this but when she researched this online, she found differently. [facility] LCSW will message her psychiatrist inquiring about this concern. Patient looks forward to her next therapy appointment on 08/11/23. She reports no urgent matters at this time. Patient was educated on healthy self care and was encouraged to continue making a daily effort implementing these into her routine. Update- Patient contacted [facility] and spoke with her psychiatry and adjusted her  medication successfully. Patient reports that dosage of the new medication is complicated  but she is getting some assistance with her family. She was advised to continue to speak with her pharmacist and providers about any medication related concerns or questions she may have. Patient does not have a follow up with counselor or psychiatry until December of 2024. [facility] resent patient a list of BH resources within her area that accept her insurance per patient request. 10/05/23 Update- Patient reports not liking her new SSRI and has already discontinued medication without notifying her psychiatrist. Patient agreeable to contact provider today to discuss gaining a new psychotropic medication to treat her symptoms. Patient was provided vision resources for [facility] per patient request. Patient was provided brief coping skill education on anxiety and depression management. Patient was advised to contact her PCP as well because she is having issues with getting her pharmacy to approve her prescription for blood strips. Update- Patient's brother passed away tragically last week in an accident which has triggered her depression and grief. 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 Extensive grief management education provided. Patient declined wanting a referral for bereavement therapy at this time. Patient does not have any behavioral health appointments until February 2025. Patient reports that her pets and family members are her main sources of support right now. She shares that her daughter in law has arranged for a meal train through their church community to provide daily meals to the family during this time as they prepare for funeral arrangements. She denies any SI/HI. [facility] LCSW will follow up next month to follow up on grief support needs.   Patient Goals/Self-Care Activities: Over the next 120 days Attend scheduled medical appointments Utilize healthy coping skills and supportive resources discussed Contact PCP with any questions or concerns Keep 90 percent of counseling  appointments Call your insurance provider for more information about your Enhanced Benefits  Check out counseling resources provided  Begin personal counseling with LCSW, to reduce and manage symptoms of Depression and Stress, until well-established with mental health provider Accept all calls from representative with [facility] in an effort to establish ongoing mental health counseling and supportive services. Incorporate into daily practice - relaxation techniques, deep breathing exercises, and mindfulness meditation strategies. Talk about feelings with friends, family members, spiritual advisor, etc. Contact LCSW directly [phone]), if you have questions, need assistance, or if additional social work needs are identified between now and our next scheduled telephone outreach call. Call 988 for mental health hotline/crisis line if needed (24/7 available) Try techniques to reduce symptoms of anxiety/negative thinking (deep breathing, distraction, positive self talk, etc)  - develop a personal safety plan - develop a plan to deal with triggers like holidays, anniversaries - exercise at least 2 to 3 times per week - have a plan for how to handle bad days - journal feelings and what helps to feel better or worse - spend time or talk with others at least 2 to 3 times per week - watch for early signs of feeling worse - begin personal counseling - call and visit an old friend - check out volunteer opportunities - join a support group - laugh; watch a funny movie or comedian - learn and use visualization or guided imagery - perform a random act of kindness - practice relaxation or meditation daily - start or continue a personal journal - practice positive thinking and self-talk -continue with compliance of taking medication  -identify current effective and ineffective  coping strategies.  -implement positive self-talk in care to increase self-esteem, confidence and feelings of control.   -consider alternative and complementary therapy approaches such as meditation, mindfulness or yoga.  -journaling, prayer, worship services, meditation or pastoral counseling.  -increase participation in pleasurable group activities such as hobbies, singing, sports or volunteering).  -consider the use of meditative movement therapy such as tai chi, yoga or qigong.  -start a regular daily exercise program based on tolerance, ability and patient choice to support positive thinking and activity    If you are experiencing a Mental Health or Behavioral Health Crisis or need someone to talk to, please call the Suicide and Crisis Lifeline: 988    Patient Goals: Follow up goal     Follow up:  Patient agrees to Care Plan and Follow-up.  Plan: The Managed Medicaid care management team will reach out to the patient again over the next 45 days.  Dickie La, BSW, MSW, LCSW Licensed Clinical Social Worker [employer]   [facility] [email or web address] Direct Dial: [phone]

## 2023-11-05 NOTE — Patient Instructions (Signed)
Visit Information  Danielle Hurley was given information about Medicaid Managed Care team care coordination services as a part of their [facility] Medicaid benefit. Danielle Hurley verbally consented to engagement with the [facility] Managed Care team.   If you are experiencing a medical emergency, please call 911 or report to your local emergency department or urgent care.   If you have a non-emergency medical problem during routine business hours, please contact your provider's office and ask to speak with a nurse.   For questions related to your [facility], please call: [phone] or visit the homepage here: [email or web address]  If you would like to schedule transportation through your [facility], please call the following number at least 2 days in advance of your appointment: [phone]   Rides for urgent appointments can also be made after hours by calling Member Services.  Call the Behavioral Health Crisis Line at [phone], at any time, 24 hours a day, 7 days a week. If you are in danger or need immediate medical attention call 911.  If you would like help to quit smoking, call 1-800-QUIT-NOW (352-100-3315) OR Espaol: 1-855-Djelo-Ya (8-756-433-2951) o para ms informacin haga clic aqu or Text READY to 884-166 to register via text  Following is a copy of your plan of care:  Care Plan : LCSW Plan of Care  Updates made by [name], LCSW since 11/05/2023 12:00 AM     Problem: Depression Identification (Depression)      Goal: Depressive Symptoms Identified   Note:   Priority: High  Timeframe:  Short-Range Goal Priority:  High Start Date:   04/30/23          Expected End Date:  ongoing                     Follow Up Date--12/09/23 at 9:15 am  - keep 90 percent of scheduled appointments -consider counseling or psychiatry -consider bumping up  your self-care  -consider creating a stronger support network   Why is this important?             Combatting depression may take some time.            If you don't feel better right away, don't give up on your treatment plan.    Current barriers:   Chronic Mental Health needs related to depression, stress and caregiver stress. Patient requires Support, Education, Resources, Referrals, Advocacy, and Care Coordination, in order to meet Unmet Mental Health Needs. Patient will implement clinical interventions discussed today to decrease symptoms of depression and increase knowledge and/or ability of: coping skills. Mental Health Concerns and Social Isolation Patient lacks knowledge of available community counseling agencies and resources.  Clinical Goal(s): verbalize understanding of plan for management of Anxiety, Depression, and Stress and demonstrate a reduction in symptoms. Patient will connect with a provider for ongoing mental health treatment, increase coping skills, healthy habits, self-management skills, and stress reduction        Patient Goals/Self-Care Activities: Over the next 120 days Attend scheduled medical appointments Utilize healthy coping skills and supportive resources discussed Contact PCP with any questions or concerns Keep 90 percent of counseling appointments Call your insurance provider for more information about your Enhanced Benefits  Check out counseling resources provided  Begin personal counseling with LCSW, to reduce and manage symptoms of Depression and Stress, until well-established with mental health provider Accept all calls from representative with [facility] in an effort  to establish ongoing mental health counseling and supportive services. Incorporate into daily practice - relaxation techniques, deep breathing exercises, and mindfulness meditation strategies. Talk about feelings with friends, family members, spiritual advisor, etc. Contact LCSW directly  [phone]), if you have questions, need assistance, or if additional social work needs are identified between now and our next scheduled telephone outreach call. Call 988 for mental health hotline/crisis line if needed (24/7 available) Try techniques to reduce symptoms of anxiety/negative thinking (deep breathing, distraction, positive self talk, etc)  - develop a personal safety plan - develop a plan to deal with triggers like holidays, anniversaries - exercise at least 2 to 3 times per week - have a plan for how to handle bad days - journal feelings and what helps to feel better or worse - spend time or talk with others at least 2 to 3 times per week - watch for early signs of feeling worse - begin personal counseling - call and visit an old friend - check out volunteer opportunities - join a support group - laugh; watch a funny movie or comedian - learn and use visualization or guided imagery - perform a random act of kindness - practice relaxation or meditation daily - start or continue a personal journal - practice positive thinking and self-talk -continue with compliance of taking medication  -identify current effective and ineffective coping strategies.  -implement positive self-talk in care to increase self-esteem, confidence and feelings of control.  -consider alternative and complementary therapy approaches such as meditation, mindfulness or yoga.  -journaling, prayer, worship services, meditation or pastoral counseling.  -increase participation in pleasurable group activities such as hobbies, singing, sports or volunteering).  -consider the use of meditative movement therapy such as tai chi, yoga or qigong.  -start a regular daily exercise program based on tolerance, ability and patient choice to support positive thinking and activity    If you are experiencing a Mental Health or Behavioral Health Crisis or need someone to talk to, please call the Suicide and Crisis  Lifeline: 988    Patient Goals: Follow up goal    24- Hour Availability:    [employer]  [address] [place], [place] [phone] Crisis [phone]   [facility] [phone]  [place] [facility]  [phone]    [facility] [place] [facility]  [phone] (after hours)   Therapeutic Alternative/Mobile Crisis   [phone]   [place] National Suicide Hotline  (463)838-2170 [place]) [place] [address]   Call [phone] for mental health emergencies   [facility]  [phone]);  [place] and [employer]  [phone]); [place], [place], [place], [place], [place], [place], [place]    [facility] Residents For 24/7 walk-up access to mental health services for [facility] children (4+), adolescents and adults, please visit the [facility] located at [address] in [place], [place].  *[place] also provides comprehensive outpatient behavioral health services in a variety of locations around the [place].  Connect With [place] [address] [place], [place] [address] HelpLine: [phone] or 1-(973)[phone]  Get Directions  Find Help 24/7 By Phone Call our 24-hour HelpLine at 660-229-0789 or 989-371-8173 for immediate assistance for mental health and substance abuse issues.  Walk-In Help [place]: [facility] (Ages 4 and Up) [place]: Emergency Dept., [facility] Additional Resources National Hopeline Network: 1-800-SUICIDE The National Suicide Prevention Lifeline:  1-800-273-TALK     The following coping skill education was provided for stress relief and mental health management: "When your car dies or a deadline looms, how do you respond? Long-term, low-grade or acute stress takes a serious toll on your body and mind, so don't ignore feelings of  constant tension. Stress is a natural part of life. However, too much stress can harm our health, especially if it continues every day. This is chronic stress and can put you at risk for heart problems like heart disease and depression. Understand what's happening inside your body and learn simple coping skills to combat the negative impacts of everyday stressors.  Types of Stress There are two types of stress: Emotional - types of emotional stress are relationship problems, pressure at work, financial worries, experiencing discrimination or having a major life change. Physical - Examples of physical stress include being sick having pain, not sleeping well, recovery from an injury or having an alcohol and drug use disorder. Fight or Flight Sudden or ongoing stress activates your nervous system and floods your bloodstream with adrenaline and cortisol, two hormones that raise blood pressure, increase heart rate and spike blood sugar. These changes pitch your body into a fight or flight response. That enabled our ancestors to outrun saber-toothed tigers, and it's helpful today for situations like dodging a car accident. But most modern chronic stressors, such as finances or a challenging relationship, keep your body in that heightened state, which hurts your health. Effects of Too Much Stress If constantly under stress, most of [place] will eventually start to function less well.  Multiple studies link chronic stress to a higher risk of heart disease, stroke, depression, weight gain, memory loss and even premature death, so it's important to recognize the warning signals. Talk to your doctor about ways to manage stress if you're experiencing any of these symptoms: Prolonged periods of poor sleep. Regular, severe headaches. Unexplained weight loss or gain. Feelings of isolation, withdrawal or worthlessness. Constant anger and irritability. Loss of interest in activities. Constant worrying or obsessive  thinking. Excessive alcohol or drug use. Inability to concentrate.  10 Ways to Cope with Chronic Stress It's key to recognize stressful situations as they occur because it allows you to focus on managing how you react. We all need to know when to close our eyes and take a deep breath when we feel tension rising. Use these tips to prevent or reduce chronic stress. 1. Rebalance Work and Home All work and no play? If you're spending too much time at the office, intentionally put more dates in your calendar to enjoy time for fun, either alone or with others. 2. Get Regular Exercise Moving your body on a regular basis balances the nervous system and increases blood circulation, helping to flush out stress hormones. Even a daily 20-minute walk makes a difference. Any kind of exercise can lower stress and improve your mood ? just pick activities that you enjoy and make it a regular habit. 3. Eat Well and Limit Alcohol and Stimulants Alcohol, nicotine and caffeine may temporarily relieve stress but have negative health impacts and can make stress worse in the long run. Well-nourished bodies cope better, so start with a good breakfast, add more organic fruits and vegetables for a well-balanced diet, avoid processed foods and sugar, try herbal tea and drink more water. 4. Connect with Supportive People Talking face to face with another person releases hormones that reduce stress. Lean on those good listeners in your life. 5.  Carve Out Hobby Time Do you enjoy gardening, reading, listening to music or some other creative pursuit? Engage in activities that bring you pleasure and joy; research shows that reduces stress by almost half and lowers your heart rate, too. 6. Practice Meditation, Stress Reduction or Yoga Relaxation techniques activate a state of restfulness that counterbalances your body's fight-or-flight hormones. Even if this also means a 10-minute break in a long day: listen to music, read, go for a  walk in nature, do a hobby, take a bath or spend time with a friend. Also consider doing a mindfulness exercise or try a daily deep breathing or imagery practice. Deep Breathing Slow, calm and deep breathing can help you relax. Try these steps to focus on your breathing and repeat as needed. Find a comfortable position and close your eyes. Exhale and drop your shoulders. Breathe in through your nose; fill your lungs and then your belly. Think of relaxing your body, quieting your mind and becoming calm and peaceful. Breathe out slowly through your nose, relaxing your belly. Think of releasing tension, pain, worries or distress. Repeat steps three and four until you feel relaxed. Imagery This involves using your mind to excite the senses -- sound, vision, smell, taste and feeling. This may help ease your stress. Begin by getting comfortable and then do some slow breathing. Imagine a place you love being at. It could be somewhere from your childhood, somewhere you vacationed or just a place in your imagination. Feel how it is to be in the place you're imagining. Pay attention to the sounds, air, colors, and who is there with you. This is a place where you feel cared for and loved. All is well. You are safe. Take in all the smells, sounds, tastes and feelings. As you do, feel your body being nourished and healed. Feel the calm that surrounds you. Breathe in all the good. Breathe out any discomfort or tension. 7. Sleep Enough If you get less than seven to eight hours of sleep, your body won't tolerate stress as well as it could. If stress keeps you up at night, address the cause, and add extra meditation into your day to make up for the lost z's. Try to get seven to nine hours of sleep each night. Make a regular bedtime schedule. Keep your room dark and cool. Try to avoid computers, TV, cell phones and tablets before bed. 8. Bond with Connections You Enjoy Go out for a coffee with a friend, chat with a  neighbor, call a family member, visit with a clergy member, or even hang out with your pet. Clinical studies show that spending even a short time with a companion animal can cut anxiety levels almost in half. 9. Take a Vacation Getting away from it all can reset your stress tolerance by increasing your mental and emotional outlook, which makes you a happier, more productive person upon return. Leave your cellphone and laptop at home! 10. See a Counselor, Coach or Therapist If negative thoughts overwhelm your ability to make positive changes, it's time to seek professional help. Make an appointment today--your health and life are worth it."  Dickie La, BSW, MSW, LCSW Licensed Clinical Social Worker [employer]   [facility] [email or web address] Direct Dial: [phone]

## 2023-11-10 ENCOUNTER — Ambulatory Visit (INDEPENDENT_AMBULATORY_CARE_PROVIDER_SITE_OTHER): Payer: Medicaid Other | Admitting: Family Medicine

## 2023-11-10 ENCOUNTER — Encounter: Payer: Self-pay | Admitting: Family Medicine

## 2023-11-10 VITALS — BP 151/87 | HR 93 | Resp 20 | Ht 71.0 in | Wt 198.0 lb

## 2023-11-10 DIAGNOSIS — E785 Hyperlipidemia, unspecified: Secondary | ICD-10-CM | POA: Diagnosis not present

## 2023-11-10 DIAGNOSIS — E1165 Type 2 diabetes mellitus with hyperglycemia: Secondary | ICD-10-CM | POA: Diagnosis not present

## 2023-11-10 DIAGNOSIS — E1159 Type 2 diabetes mellitus with other circulatory complications: Secondary | ICD-10-CM

## 2023-11-10 DIAGNOSIS — R221 Localized swelling, mass and lump, neck: Secondary | ICD-10-CM

## 2023-11-10 DIAGNOSIS — I152 Hypertension secondary to endocrine disorders: Secondary | ICD-10-CM

## 2023-11-10 DIAGNOSIS — E1169 Type 2 diabetes mellitus with other specified complication: Secondary | ICD-10-CM

## 2023-11-10 DIAGNOSIS — M542 Cervicalgia: Secondary | ICD-10-CM | POA: Diagnosis not present

## 2023-11-10 DIAGNOSIS — N3281 Overactive bladder: Secondary | ICD-10-CM | POA: Diagnosis not present

## 2023-11-10 LAB — POCT GLYCOSYLATED HEMOGLOBIN (HGB A1C): Hemoglobin A1C: 11.2 % — AB (ref 4.0–5.6)

## 2023-11-10 LAB — POCT UA - MICROALBUMIN
Albumin/Creatinine Ratio, Urine, POC: >300
Creatinine, POC: 50 mg/dL
Microalbumin Ur, POC: 150 mg/L

## 2023-11-10 MED ORDER — ATORVASTATIN CALCIUM 20 MG PO TABS: 20.0000 mg | ORAL_TABLET | Freq: Every day | ORAL | 0 refills | Status: DC

## 2023-11-10 MED ORDER — TRULICITY 3 MG/0.5ML ~~LOC~~ SOAJ: 2.0000 mg | SUBCUTANEOUS | 1 refills | Status: DC

## 2023-11-10 MED ORDER — TRULICITY 1.5 MG/0.5ML ~~LOC~~ SOAJ: 1.5000 mg | SUBCUTANEOUS | 0 refills | Status: DC

## 2023-11-10 MED ORDER — METFORMIN HCL ER 500 MG PO TB24: 500.0000 mg | ORAL_TABLET | Freq: Two times a day (BID) | ORAL | 1 refills | Status: DC

## 2023-11-10 MED ORDER — GLIMEPIRIDE 4 MG PO TABS: 4.0000 mg | ORAL_TABLET | Freq: Two times a day (BID) | ORAL | 1 refills | Status: DC

## 2023-11-10 MED ORDER — VALSARTAN-HYDROCHLOROTHIAZIDE 320-12.5 MG PO TABS: 1.0000 | ORAL_TABLET | Freq: Every day | ORAL | 1 refills | Status: DC

## 2023-11-10 MED ORDER — TRULICITY 0.75 MG/0.5ML ~~LOC~~ SOAJ: 0.7500 mg | SUBCUTANEOUS | 0 refills | Status: DC

## 2023-11-10 MED ORDER — OXYBUTYNIN CHLORIDE 5 MG PO TABS: 5.0000 mg | ORAL_TABLET | Freq: Every day | ORAL | 1 refills | Status: DC

## 2023-11-10 NOTE — Assessment & Plan Note (Addendum)
A1c improved to 11.2 from 11.9.  Working on expediting prior authorization and cover my meds as it still has not been processed.  Continue glimepiride 4 mg twice daily, metformin 500 mg twice daily, reducing carbs/sugar intake.  Will continue to monitor.

## 2023-11-10 NOTE — Assessment & Plan Note (Signed)
BP goal <130/80.  Continue valsartan 320 mg daily.  In the future, if needed can initiate combination valsartan-hydrochlorothiazide pill for improved control.  Will continue to monitor.

## 2023-11-10 NOTE — Assessment & Plan Note (Signed)
Last lipid panel: LDL 67, HDL 39, triglycerides 155 which is a decrease from 171.  Continue atorvastatin 10 mg daily.  Will continue to monitor.  Recheck lipid panel and hepatic function in 3 months.

## 2023-11-10 NOTE — Progress Notes (Signed)
Established Patient Office Visit  Subjective   Patient ID: Danielle Hurley, female    DOB: Nov 19, 1961  Age: 62 y.o. MRN: [id number]  Chief Complaint  Patient presents with   Diabetes   Hyperlipidemia   Hypertension    HPI Danielle Hurley is a 62 y.o. female presenting today for follow up of hypertension, hyperlipidemia, diabetes.  She is also under significant stress experiencing grief after her brother was killed in a hit-and-run last week.  She also complains of worsening headaches due to neck pain and notes that she recently noticed a small firm mass on the left side of her neck that she is concerned is on one of the blood vessels. Hypertension:  Pt denies chest pain, SOB, dizziness, edema, syncope, fatigue or heart palpitations. Taking valsartan, reports excellent compliance with treatment. Denies side effects. Hyperlipidemia: tolerating atorvastatin well with no myalgias or significant side effects.  The 10-year ASCVD risk score (Arnett DK, et al., 2019) is: 12% Diabetes: denies hypoglycemic events, wounds or sores that are not healing well, increased thirst or urination. Denies vision problems, eye exam due.  At last visit, recommended to continue glimepiride 4 mg twice daily, metformin 500 mg twice daily. Discontinue saxagliptin, start Mounjaro 2.5 mg weekly.  Insurance would not cover Mounjaro and required switching plan to Trulicity 0.75 mg weekly.  Her insurance still would not cover the Trulicity so she has not been able to start it.  Taking glimepiride, metformin as prescribed without any side effects.   Outpatient Medications Prior to Visit  Medication Sig   Accu-Chek Softclix Lancets lancets USE 1  TO CHECK GLUCOSE IN THE MORNING AND 1 AT NOON AND 1 AT BEDTIME   acetaminophen (TYLENOL) 325 MG tablet Take 650 mg by mouth every 6 (six) hours as needed.   aspirin-acetaminophen-caffeine (EXCEDRIN MIGRAINE) 250-250-65 MG tablet Take 1 tablet by mouth every 6 (six) hours as needed for  headache.   Bismuth Subsalicylate (KAOPECTATE) 262 MG TABS Take 262 mg by mouth daily as needed (upset stomach).   blood glucose meter kit and supplies Dispense based on patient and insurance preference. Use up to four times daily as directed. (FOR ICD-10 E10.9, E11.9).   Blood Glucose Monitoring Suppl DEVI 1 each by Does not apply route in the morning, at noon, and at bedtime. May substitute to any manufacturer covered by patient's insurance.   celecoxib (CELEBREX) 200 MG capsule TAKE 1 CAPSULE BY MOUTH TWICE DAILY AS NEEDED   cetirizine (ZYRTEC) 10 MG tablet Take 10 mg by mouth daily as needed for allergies.   Cholecalciferol (VITAMIN D) 50 MCG (2000 UT) tablet Take 2,000 Units by mouth daily.   cyclobenzaprine (FLEXERIL) 5 MG tablet Take 1 tablet by mouth three times daily as needed for muscle spasm   FLUoxetine (PROZAC) 10 MG capsule Take 1 capsule (10 mg total) by mouth daily.   glucose blood (TRUETRACK TEST) test strip Bloodsugar testing QD and prn   guaifenesin (HUMIBID E) 400 MG TABS tablet Take 400 mg by mouth daily as needed.   LAMICTAL 25 MG tablet Take one 25 mg tablet daily for 14 days Then take two 25 mg tablet daily for 14 days Then take one 100 mg tablet daily for 7 days Then take one and a half 100 mg tablets daily for 7 days Then take two 100 mg tablets daily for 7 days   mupirocin ointment (BACTROBAN) 2 % Apply small amount to effected area twice daily for 2 weeks then as needed  pantoprazole (PROTONIX) 40 MG tablet Take 1 tablet by mouth once daily   [DISCONTINUED] atorvastatin (LIPITOR) 20 MG tablet Take 1 tablet by mouth once daily   [DISCONTINUED] Dulaglutide (TRULICITY) 0.75 MG/0.5ML SOAJ Inject 0.75 mg into the skin once a week.   [DISCONTINUED] glimepiride (AMARYL) 4 MG tablet Take 1 tablet (4 mg total) by mouth 2 (two) times daily.   [DISCONTINUED] metFORMIN (GLUCOPHAGE-XR) 500 MG 24 hr tablet Take 1 tablet (500 mg total) by mouth 2 (two) times daily with a meal.    [DISCONTINUED] oxybutynin (DITROPAN-XL) 5 MG 24 hr tablet Take 1 tablet (5 mg total) by mouth daily.   [DISCONTINUED] valsartan (DIOVAN) 320 MG tablet Take 1 tablet (320 mg total) by mouth daily.   No facility-administered medications prior to visit.    ROS Negative unless otherwise noted in HPI   Objective:     BP (!) 151/87 (BP Location: Left Arm, Patient Position: Sitting, Cuff Size: Normal)   Pulse 93   Resp 20   Ht 5\' 11"  (1.803 m)   Wt 198 lb (89.8 kg)   SpO2 97%   BMI 27.62 kg/m   Physical Exam Constitutional:      General: She is not in acute distress.    Appearance: Normal appearance.  HENT:     Head: Normocephalic and atraumatic.  Neck:     Vascular: No carotid bruit.      Comments: Palpable firm 0.5 cm mass Cardiovascular:     Rate and Rhythm: Normal rate and regular rhythm.     Heart sounds: No murmur heard.    No friction rub. No gallop.  Pulmonary:     Effort: Pulmonary effort is normal. No respiratory distress.     Breath sounds: No wheezing, rhonchi or rales.  Musculoskeletal:     Cervical back: No tenderness.  Skin:    General: Skin is warm and dry.  Neurological:     Mental Status: She is alert and oriented to person, place, and time.    Results for orders placed or performed in visit on 11/10/23  POCT HgB A1C  Result Value Ref Range   Hemoglobin A1C 11.2 (A) 4.0 - 5.6 %   HbA1c POC (<> result, manual entry)     HbA1c, POC (prediabetic range)     HbA1c, POC (controlled diabetic range)    POCT UA - Microalbumin  Result Value Ref Range   Microalbumin Ur, POC 150 mg/L   Creatinine, POC 50 mg/dL   Albumin/Creatinine Ratio, Urine, POC >300      Assessment & Plan:  Type 2 diabetes mellitus with hyperglycemia, without long-term current use of insulin (HCC) Assessment & Plan: A1c improved to 11.2 from 11.9.  Working on expediting prior authorization and cover my meds as it still has not been processed.  Continue glimepiride 4 mg twice daily,  metformin 500 mg twice daily, reducing carbs/sugar intake.  Will continue to monitor.  Orders: -     POCT glycosylated hemoglobin (Hb A1C) -     POCT UA - Microalbumin -     CBC with Differential/Platelet; Future -     Comprehensive metabolic panel; Future -     Hemoglobin A1c; Future -     metFORMIN HCl ER; Take 1 tablet (500 mg total) by mouth 2 (two) times daily with a meal.  Dispense: 180 tablet; Refill: 1 -     Trulicity; Inject 1.5 mg into the skin once a week. After 4 weeks, move to next highest dose.  Dispense: 2 mL; Refill: 0 -     Trulicity; Inject 2 mg into the skin once a week.  Dispense: 2 mL; Refill: 1 -     Trulicity; Inject 0.75 mg into the skin once a week. After 4 weeks, start next highest dose.  Dispense: 2 mL; Refill: 0 -     Glimepiride; Take 1 tablet (4 mg total) by mouth 2 (two) times daily.  Dispense: 180 tablet; Refill: 1  Hypertension associated with diabetes (HCC) Assessment & Plan: BP goal <130/80.  Continue valsartan 320 mg daily.  In the future, if needed can initiate combination valsartan-hydrochlorothiazide pill for improved control.  Will continue to monitor.  Orders: -     CBC with Differential/Platelet; Future -     Comprehensive metabolic panel; Future -     Valsartan-hydroCHLOROthiazide; Take 1 tablet by mouth daily.  Dispense: 90 tablet; Refill: 1  Hyperlipidemia associated with type 2 diabetes mellitus (HCC) Assessment & Plan: Last lipid panel: LDL 67, HDL 39, triglycerides 155 which is a decrease from 171.  Continue atorvastatin 10 mg daily.  Will continue to monitor.  Recheck lipid panel and hepatic function in 3 months.  Orders: -     CBC with Differential/Platelet; Future -     Comprehensive metabolic panel; Future -     Lipid panel; Future -     Atorvastatin Calcium; Take 1 tablet (20 mg total) by mouth daily.  Dispense: 90 tablet; Refill: 0  Overactive bladder -     oxyBUTYnin Chloride; Take 1 tablet (5 mg total) by mouth daily.   Dispense: 90 tablet; Refill: 1  Neck mass -     US SOFT TISSUE HEAD & NECK (NON-THYROID); Future  Neck pain -     DG Cervical Spine Complete  Evaluating mass of soft tissue of the neck with ultrasound to start. Cervical x-ray to evaluate neck pain.  Return in about 3 months (around 02/08/2024) for follow-up for HTN, HLD, DM, fasting blood work 1 week before.   I spent 45 minutes on the day of the encounter to include pre-visit record review, face-to-face time with the patient, and post visit ordering of imaging and labs.   [name], PA

## 2023-11-11 ENCOUNTER — Telehealth: Payer: Self-pay

## 2023-11-11 NOTE — Telephone Encounter (Signed)
Patient and Jordan Hawks has been notified that Trulicity 0.75mg / 5ml coverage was approved by insurance.

## 2023-11-17 ENCOUNTER — Ambulatory Visit
Admission: RE | Admit: 2023-11-17 | Discharge: 2023-11-17 | Disposition: A | Discharge status: Home / Self Care | Payer: Medicaid Other | Source: Ambulatory Visit | Attending: Family Medicine | Admitting: Family Medicine

## 2023-11-17 DIAGNOSIS — M436 Torticollis: Secondary | ICD-10-CM | POA: Diagnosis not present

## 2023-11-17 DIAGNOSIS — R221 Localized swelling, mass and lump, neck: Secondary | ICD-10-CM

## 2023-12-03 NOTE — Telephone Encounter (Signed)
 If we go back to the valsartan  alone, I will need her to keep a blood pressure log for 2 weeks and come in for a nurse BP check at that time to ensure that the valsartan  without hydrochlorothiazide  provides adequate blood pressure control.  If she is agreeable to that, then I can send in the prescription for valsartan  320 mg.

## 2023-12-03 NOTE — Telephone Encounter (Signed)
 Called pt she stated that she will give the medication another week to see how she reacts to it if she feel the same she will contact the office

## 2023-12-03 NOTE — Telephone Encounter (Signed)
 Copied from CRM [id number]. Topic: Clinical - Prescription Issue >> Dec 03, 2023  9:28 AM Arlina R wrote: Reason for CRM:  Patient wants to change her medication doasge down to it was before. It was Valsartan  320 and now have complications like dizziness, nauseous, and super weak. The new have Valsartan  hydrochlorothiazide  DON'T like this one. Want to go back to taking without.

## 2023-12-09 ENCOUNTER — Other Ambulatory Visit: Payer: Self-pay | Admitting: Licensed Clinical Social Worker

## 2023-12-09 NOTE — Patient Instructions (Signed)
 Danielle Hurley ,   The [facility] Managed Care Team is available to provide assistance to you with your healthcare needs at no cost and as a benefit of your [facility] plan. I'm sorry I was unable to reach you today for our scheduled appointment. Our care guide will call you to reschedule our telephone appointment. Please call me at the number below. I am available to be of assistance to you regarding your healthcare needs. .   Thank you,   Lyle Rung, BSW, MSW, LCSW Licensed Clinical Social Worker [employer]   [facility] [email or web address] Direct Dial: [phone]

## 2023-12-09 NOTE — Patient Outreach (Signed)
  Medicaid Managed Care   Unsuccessful Attempt Note   12/09/2023 Name: Danielle Hurley MRN: [id number] DOB: Oct 10, 1961  Referred by: Wallace Joesph LABOR, PA Reason for referral : No chief complaint on file.   An unsuccessful telephone outreach was attempted today. The patient was referred to the case management team for assistance with care management and care coordination.    Follow Up Plan: A HIPAA compliant phone message was left for the patient providing contact information and requesting a return call.   Lyle Rung, BSW, MSW, LCSW Licensed Clinical Social Worker [employer]   [facility] [email or web address] Direct Dial: [phone]

## 2023-12-15 ENCOUNTER — Encounter (HOSPITAL_COMMUNITY): Payer: Medicaid Other | Admitting: Psychiatry

## 2023-12-17 ENCOUNTER — Other Ambulatory Visit: Payer: Self-pay | Admitting: Family Medicine

## 2023-12-17 ENCOUNTER — Telehealth: Payer: Self-pay | Admitting: Family Medicine

## 2023-12-17 ENCOUNTER — Ambulatory Visit: Payer: Self-pay | Admitting: Family Medicine

## 2023-12-17 DIAGNOSIS — E1165 Type 2 diabetes mellitus with hyperglycemia: Secondary | ICD-10-CM

## 2023-12-17 MED ORDER — TRULICITY 3 MG/0.5ML ~~LOC~~ SOAJ: 2.0000 mg | SUBCUTANEOUS | 1 refills | Status: DC

## 2023-12-17 NOTE — Telephone Encounter (Signed)
This has been resolved by April Z.

## 2023-12-17 NOTE — Telephone Encounter (Signed)
  Chief Complaint: Medication Refill Issue Symptoms: N/A Frequency: N/A Pertinent Negatives: Patient denies N/A Disposition: [] ED /[] Urgent Care (no appt availability in office) / [] Appointment(In office/virtual)/ []  [facility]/ [] Home Care/ [] Refused Recommended Disposition /[] [facility]/ []  Follow-up with PCP Additional Notes: Patient advised that she was trying to get her Trulicity refilled.  Patient is due to the next higher dose after her 0.75mg .  Patient denies any side effects or symptoms but the pharmacy just stated that they did not have the prescription for the next dose of Trulicity. Patient is supposed to take it on Sunday 12/19/2023 but has not gotten it yet due to this issue.  When this RN looked in the patient's Med list on her chart--it appears that the 1.5mg  dose is written to start 01/05/2024 and the 2mg  dose is written to start 12/08/2023. Unsure if this may be why the patient has not gotten the next dose.  Reason for Disposition . [1] Prescription not at pharmacy AND [2] was prescribed by PCP recently  (Exception: [facility] has access to EMR and prescription is recorded there. Go to Home Care and confirm for pharmacy.) . [1] Caller requesting NON-URGENT health information AND [2] PCP's office is the best resource  Answer Assessment - Initial Assessment Questions 1. DRUG NAME: "What medicine do you need to have refilled?"     Trulicity  2. REFILLS REMAINING: "How many refills are remaining?" (Note: The label on the medicine or pill bottle will show how many refills are remaining. If there are no refills remaining, then a renewal may be needed.)     One for the 1.5 dose and then one more for the next higher dose 3. EXPIRATION DATE: "What is the expiration date?" (Note: The label states when the prescription will expire, and thus can no longer be refilled.)     N/a 4. PRESCRIBING HCP: "Who prescribed it?" Reason: If prescribed by specialist, call should be  referred to that group.     Saralyn Pilar PA 5. SYMPTOMS: "Do you have any symptoms?"     No  Answer Assessment - Initial Assessment Questions 1. REASON FOR CALL or QUESTION: "What is your reason for calling today?" or "How can I best help you?" or "What question do you have that I can help answer?"     Patient states she was supposed to pick up Trulicity on Sunday but the pharmacy said they did not have a prescription for it  Protocols used: Medication Refill and Renewal Call-A-[facility], Information Only Call - No Triage-A-AH

## 2023-12-17 NOTE — Telephone Encounter (Signed)
Informed pt that the pharmacy would be working to fill the correct Rx.

## 2023-12-17 NOTE — Telephone Encounter (Signed)
Copied from CRM [id number]. Topic: Clinical - Prescription Issue >> Dec 17, 2023 11:04 AM Ivette P wrote: Reason for CRM: Pt calling in to get prescription refill for Dulaglutide (TRULICITY), Pt is not sure which dose they are supposed to take next. Requesting a callback [phone]

## 2023-12-17 NOTE — Telephone Encounter (Signed)
Copied from CRM [id number]. Topic: Clinical - Medication Refill >> Dec 17, 2023 11:07 AM Bobbye Morton wrote: Most Recent Primary Care Visit:  Provider: Saralyn Pilar A  Department: [facility]  Visit Type: OFFICE VISIT 20  Date: 11/10/2023  Medication: Dulaglutide (TRULICITY) 1.5 MG/0.5ML SOAJ  Has the patient contacted their pharmacy? Yes (Agent: If no, request that the patient contact the pharmacy for the refill. If patient does not wish to contact the pharmacy document the reason why and proceed with request.) (Agent: If yes, when and what did the pharmacy advise?)  Is this the correct pharmacy for this prescription? Yes If no, delete pharmacy and type the correct one.  This is the patient's preferred pharmacy:  [facility] [address] ([address]  - [address] [place] ([place]) [place] [address] Phone: [phone] Fax: [phone]   Has the prescription been filled recently? No  Is the patient out of the medication? Yes  Has the patient been seen for an appointment in the last year OR does the patient have an upcoming appointment? Yes  Can we respond through MyChart? Yes  Agent: Please be advised that Rx refills may take up to 3 business days. We ask that you follow-up with your pharmacy.

## 2023-12-17 NOTE — Telephone Encounter (Unsigned)
Copied from CRM [id number]. Topic: Clinical - Medication Refill >> Dec 17, 2023 11:07 AM Bobbye Morton wrote: Most Recent Primary Care Visit:  Provider: Saralyn Pilar A  Department: [facility]  Visit Type: OFFICE VISIT 20  Date: 11/10/2023  Medication: Dulaglutide (TRULICITY) 1.5 MG/0.5ML SOAJ  Has the patient contacted their pharmacy? Yes (Agent: If no, request that the patient contact the pharmacy for the refill. If patient does not wish to contact the pharmacy document the reason why and proceed with request.) (Agent: If yes, when and what did the pharmacy advise?)  Is this the correct pharmacy for this prescription? Yes If no, delete pharmacy and type the correct one.  This is the patient's preferred pharmacy:  [facility] [address] ([address]  - [address] [place] ([place]) [place] [address] Phone: [phone] Fax: [phone]   Has the prescription been filled recently? No  Is the patient out of the medication? Yes  Has the patient been seen for an appointment in the last year OR does the patient have an upcoming appointment? Yes  Can we respond through MyChart? Yes  Agent: Please be advised that Rx refills may take up to 3 business days. We ask that you follow-up with your pharmacy.

## 2023-12-20 ENCOUNTER — Other Ambulatory Visit: Payer: Self-pay | Admitting: Family Medicine

## 2023-12-20 DIAGNOSIS — E1165 Type 2 diabetes mellitus with hyperglycemia: Secondary | ICD-10-CM

## 2023-12-20 MED ORDER — TRULICITY 3 MG/0.5ML ~~LOC~~ SOAJ: 3.0000 mg | SUBCUTANEOUS | 1 refills | Status: DC

## 2023-12-21 ENCOUNTER — Telehealth: Payer: Self-pay | Admitting: Family Medicine

## 2023-12-21 NOTE — Telephone Encounter (Signed)
Please see note below.  Copied from CRM [id number]. Topic: Clinical - Prescription Issue >> Dec 21, 2023 10:01 AM Antony Haste wrote: Reason for CRM: This patient states the dosage for Dulaglutide (TRULICITY) 3 MG/0.5ML SOAJ should be increased for her refill to [facility], she is requesting for this to be completed by today if possible. Callback #: [phone]

## 2023-12-21 NOTE — Telephone Encounter (Signed)
Contacted pt and she said that she picked up her old dose and took it on Sunday.  She wanted to know if she should stay with that dose and then start the next when complete.  PCP said she should finish those and then start the increased dose.  Informed her to reach out to pharmacy and let them know to file the other Rx till then.
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
## 2024-01-04 ENCOUNTER — Ambulatory Visit (INDEPENDENT_AMBULATORY_CARE_PROVIDER_SITE_OTHER): Payer: Medicaid Other | Admitting: Mental Health

## 2024-01-04 ENCOUNTER — Encounter (HOSPITAL_COMMUNITY): Payer: Self-pay

## 2024-01-04 DIAGNOSIS — F411 Generalized anxiety disorder: Secondary | ICD-10-CM

## 2024-01-04 DIAGNOSIS — F329 Major depressive disorder, single episode, unspecified: Secondary | ICD-10-CM

## 2024-01-04 DIAGNOSIS — F322 Major depressive disorder, single episode, severe without psychotic features: Secondary | ICD-10-CM | POA: Diagnosis not present

## 2024-01-04 NOTE — Progress Notes (Signed)
   THERAPIST PROGRESS NOTE  Session Time: 3:06 pm ( 56 minutes)  Participation Level: Active  Behavioral Response: CasualAlertDysphoric  Type of Therapy: Individual Therapy  Treatment Goals addressed: STG: [name] will increase management of depressive sxs AEB development of effective coping skills with ability to process thoughts in balanced manner per self report with the next 90 days.   ProgressTowards Goals: Initial  Interventions: Supportive  Summary: Danielle Hurley is a 63 y.o. female who presents with dx  of major depression and generalized anxiety disorder. [name] presents for session alert and oriented; mood and affect low; depressed. Speech clear and coherent at normal rare and tone. Engaged and receptive to interventions. Shares events of brother to have passed away by being hit by a truck on December 4th of 2024. Shares events of that day and shares memories of brother. Shares to have since moved in with daughter and notes difficulties with adjustment  I do not like being told what to do. Shares to have been able to keep her pets with serve as a support to her. Shares for daughter to dislike her talking about her brother and shares for her children to have been close to her brother. 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 Agrees to treatment goals to work on health visitor of depression and shares would like outlet to share thoughts. Denies safety concerns.   Suicidal/Homicidal: Nowithout intent/plan  Therapist Response: Therapist engaged [name] in therapy session. Completed check in and assessed for current level of functioning sxs management and level of stressors. Provided safe space to share thoughts and feelings in regards to recent events of brothers passing. Active empathic listening, provided support and encouragement; validated feelings. Engaged in  education on grief and normalized feelings. Provided space to share thoughts in regard to brother and feelings of grief. Discussed treatment plan. Reviewed session and provided follow up.   Plan: Return again in  x 6 weeks.  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 Patient/Guardian was advised if they have not already done so to contact the registration department to sign all necessary forms in order for us  to release information regarding their care.   Consent: Patient/Guardian gives verbal consent for treatment and assignment of benefits for services provided during this visit. Patient/Guardian expressed understanding and agreed to proceed.   Ty Asal [place], [facility] 01/08/2024

## 2024-01-05 ENCOUNTER — Encounter (HOSPITAL_COMMUNITY): Payer: Medicaid Other | Admitting: Psychiatry

## 2024-01-05 ENCOUNTER — Encounter (HOSPITAL_COMMUNITY): Payer: Self-pay

## 2024-01-06 ENCOUNTER — Encounter: Payer: Self-pay | Admitting: Family Medicine

## 2024-01-07 ENCOUNTER — Other Ambulatory Visit: Payer: Self-pay | Admitting: *Deleted

## 2024-01-07 DIAGNOSIS — M62838 Other muscle spasm: Secondary | ICD-10-CM

## 2024-01-07 DIAGNOSIS — E1165 Type 2 diabetes mellitus with hyperglycemia: Secondary | ICD-10-CM

## 2024-01-07 DIAGNOSIS — K219 Gastro-esophageal reflux disease without esophagitis: Secondary | ICD-10-CM

## 2024-01-07 DIAGNOSIS — E1159 Type 2 diabetes mellitus with other circulatory complications: Secondary | ICD-10-CM

## 2024-01-07 DIAGNOSIS — S134XXA Sprain of ligaments of cervical spine, initial encounter: Secondary | ICD-10-CM

## 2024-01-07 DIAGNOSIS — E1169 Type 2 diabetes mellitus with other specified complication: Secondary | ICD-10-CM

## 2024-01-07 MED ORDER — METFORMIN HCL ER 500 MG PO TB24: 500.0000 mg | ORAL_TABLET | Freq: Two times a day (BID) | ORAL | 1 refills | Status: DC

## 2024-01-07 MED ORDER — VALSARTAN-HYDROCHLOROTHIAZIDE 320-12.5 MG PO TABS: 1.0000 | ORAL_TABLET | Freq: Every day | ORAL | 1 refills | Status: DC

## 2024-01-07 MED ORDER — PANTOPRAZOLE SODIUM 40 MG PO TBEC: 40.0000 mg | DELAYED_RELEASE_TABLET | Freq: Every day | ORAL | 0 refills | Status: DC

## 2024-01-07 MED ORDER — GLIMEPIRIDE 4 MG PO TABS: 4.0000 mg | ORAL_TABLET | Freq: Two times a day (BID) | ORAL | 1 refills | Status: DC

## 2024-01-07 MED ORDER — ATORVASTATIN CALCIUM 20 MG PO TABS: 20.0000 mg | ORAL_TABLET | Freq: Every day | ORAL | 0 refills | Status: DC

## 2024-01-07 MED ORDER — CYCLOBENZAPRINE HCL 5 MG PO TABS: 5.0000 mg | ORAL_TABLET | Freq: Three times a day (TID) | ORAL | 0 refills | Status: DC | PRN

## 2024-01-07 NOTE — Telephone Encounter (Signed)
 Copied from CRM [id number]. Topic: Clinical - Prescription Issue >> Jan 07, 2024 10:45 AM Antonio DEL wrote: Reason for CRM: Patient is switching to a new pharmacy, the [facility] on [address] in [place], [place]. [facility] will not switch her prescriptions for her because she does not have any refills so she is needing a new script to be wrote for 6 medications and sent to [facility].  metFORMIN  (GLUCOPHAGE -XR) 500 MG 24 hr tablet cyclobenzaprine  (FLEXERIL ) 5 MG tablet valsartan -hydrochlorothiazide  (DIOVAN -HCT) 320-12.5 MG tablet glimepiride  (AMARYL ) 4 MG tablet atorvastatin  (LIPITOR) 20 MG tablet pantoprazole  (PROTONIX ) 40 MG tablet

## 2024-01-08 DIAGNOSIS — F322 Major depressive disorder, single episode, severe without psychotic features: Secondary | ICD-10-CM | POA: Insufficient documentation

## 2024-01-17 ENCOUNTER — Other Ambulatory Visit: Payer: Self-pay | Admitting: Licensed Clinical Social Worker

## 2024-01-17 NOTE — Patient Instructions (Signed)
Visit Information  Danielle Hurley was given information about Medicaid Managed Care team care coordination services as a part of their [facility] Community Plan Medicaid benefit. Danielle Hurley verbally consented to engagement with the [facility] Managed Care team.   If you are experiencing a medical emergency, please call 911 or report to your local emergency department or urgent care.   If you have a non-emergency medical problem during routine business hours, please contact your provider's office and ask to speak with a nurse.   For questions related to your [facility], please call: [phone] or visit the homepage here: [email or web address]  If you would like to schedule transportation through your [facility], please call the following number at least 2 days in advance of your appointment: [phone]   Rides for urgent appointments can also be made after hours by calling Member Services.  Call the Behavioral Health Crisis Line at [phone], at any time, 24 hours a day, 7 days a week. If you are in danger or need immediate medical attention call 911.  If you would like help to quit smoking, call 1-800-QUIT-NOW ((848) 706-2851) OR Espaol: 1-855-Djelo-Ya (1-324-401-0272) o para ms informacin haga clic aqu or Text READY to 536-644 to register via text  Following is a copy of your plan of care:  Care Plan : LCSW Plan of Care  Updates made by [name], LCSW since 01/17/2024 12:00 AM     Problem: Depression Identification (Depression)      Goal: Depressive Symptoms Identified   Note:   Priority: High  Timeframe:  Short-Range Goal Priority:  High Start Date:   04/30/23          Expected End Date:  ongoing                     Follow Up Date--Goal met and ended on 01/17/24  - keep 90 percent of scheduled appointments -consider counseling or psychiatry -consider  bumping up your self-care  -consider creating a stronger support network   Why is this important?             Combatting depression may take some time.            If you don't feel better right away, don't give up on your treatment plan.    Current barriers:   Chronic Mental Health needs related to depression, stress and caregiver stress. Patient requires Support, Education, Resources, Referrals, Advocacy, and Care Coordination, in order to meet Unmet Mental Health Needs. Patient will implement clinical interventions discussed today to decrease symptoms of depression and increase knowledge and/or ability of: coping skills. Mental Health Concerns and Social Isolation Patient lacks knowledge of available community counseling agencies and resources.  Clinical Goal(s): verbalize understanding of plan for management of Anxiety, Depression, and Stress and demonstrate a reduction in symptoms. Patient will connect with a provider for ongoing mental health treatment, increase coping skills, healthy habits, self-management skills, and stress reduction        Clinical Interventions:  Assessed patient's previous and current treatment, coping skills, support system and barriers to care. Patient provided hx. Patient is the primary caregiver of her deaf brother who resides with her. She reports that family came from [place] 3 years ago. Patient reports 3 or 4 adult kids live nearby. Verbalization of feelings encouraged, motivational interviewing employed Emotional support provided, positive coping strategies explored. Establishing healthy boundaries emphasized and healthy self-care education provided Patient was educated on  available mental health resources within their area that accept Medicaid and offer counseling and psychiatry. Email sent to patient today with available mental health resources within her area that accept Medicaid and offer the services that she is interested in. Email included  instructions for scheduling at [facility] as well as some crisis support resources and [facility]'s walk in clinic hours Patient reports significant worsening anxiety and depression affecting her ability to function appropriately and carry out daily task. She reports having trouble getting out of the bed some days.  Patient receives strong support from spouse, MIL, and mentor LCSW provided education on relaxation techniques such as meditation, deep breathing, massage, grounding exercises or yoga that can activate the body's relaxation response and ease symptoms of stress and anxiety. LCSW ask that when pt is struggling with difficult emotions and racing thoughts that they start this relaxation response process. LCSW provided extensive education on healthy coping skills for anxiety. SW used active and reflective listening, validated patient's feelings/concerns, and provided emotional support. Patient will work on implementing appropriate self-care habits into their daily routine such as: staying positive, writing a gratitude list, drinking water, staying active around the house, taking their medications as prescribed, combating negative thoughts or emotions and staying connected with their family and friends. Positive reinforcement provided for this decision to work on this. LCSW provided education on healthy sleep hygiene and what that looks like. LCSW encouraged patient to implement a night time routine into their schedule that works best for them and that they are able to maintain. Advised patient to implement deep breathing/grounding/meditation/self-care exercises into their nightly routine to combat racing thoughts at night. LCSW encouraged patient to wake up at the same time each day, make their sleeping environment comfortable, exercise when able, to limit naps and to not eat or drink anything right before bed.  Motivational Interviewing employed Depression screen reviewed  PHQ2/ PHQ9 completed or reviewed   Mindfulness or Relaxation training provided Active listening / Reflection utilized  Advance Care and HCPOA education provided Emotional Support Provided Problem Solving /Task Center strategies reviewed Provided psychoeducation for mental health needs  Provided brief CBT  Reviewed mental health medications and discussed importance of compliance:  Quality of sleep assessed & Sleep Hygiene techniques promoted  Participation in counseling encouraged  Verbalization of feelings encouraged  Suicidal Ideation/Homicidal Ideation assessed: Patient denies SI/HI  Review resources, discussed options and provided patient information about  Mental Health Resources Inter-disciplinary care team collaboration (see longitudinal plan of care) 05/12/23- Patient reviewed list and is agreeable to referral to [facility] as she wishes to stay within the [facility] network. She reports that they received some great health related news regarding her brother which has helped them reduce traveling back and forth to those necessary medical appointments. 05/28/23- Patient states that [facility] called her and she has scheduled both her psychiatry and counseling appointment. She reports that she psychiatry appointment is with a female and she prefers females providers but desired to get the earliest appointment for medication management. Patient denies any current crises at the time. She is agreeable to [facility] LCSW contacting her on 06/04/23 to follow up.and ensure mental health support was established. Update- Patient recently completed her initial psychiatry visit on 06/02/23. She reports that she was prescribed zoloft but she and her daughter as now hesitant about her taking this new medication. Patient as advised to contact [facility] and PCP office for further information/direction on new psychiatric medication. Patient was appreciative of call and support. Patient will start  counseling at [facility] next month. Update- Patient reports that she has  decided to discontinue zoloft because she did research on medication side effects and read that she is unable to take tylenol with medication. [facility] LCSW strongly encouraged her to contact her provider at [facility] to follow up on this and to contact her prescriber for questions/concerns before she changes her psychiatric medications next time she decides to abruptly stop a medicine. Patient expressed understanding. Brief self-care and coping skill education provided. 07/09/23- Patient reports doing well but she is struggling with chronic lower back pain which triggers her stress and depression. Advised patient to exercise 10-20 minutes everyday, go outside in the sunlight everyday for at least 10-15 minutes, drink 6-8 glasses of water a day, take vitamins/medications as directed and try to increase sleep. Patient has her initial psychiatry and counseling appointments on 07/20/23 and 07/21/23 and reports that her daughter will transport her to these appointments. [facility] LCSW will follow up in three weeks. Update- Patient is now established with long term mental health providers at [facility]. Patient reports that she was put on Prozac and was told by her psychiatrist that she can still take ibuprofen with this but when she researched this online, she found differently. [facility] LCSW will message her psychiatrist inquiring about this concern. Patient looks forward to her next therapy appointment on 08/11/23. She reports no urgent matters at this time. Patient was educated on healthy self care and was encouraged to continue making a daily effort implementing these into her routine. Update- Patient contacted [facility] and spoke with her psychiatry and adjusted her medication successfully. Patient reports that dosage of the new medication is complicated but she is getting some assistance with her family. She was advised to continue to speak with her pharmacist and providers about any medication related concerns or questions she may have. Patient  does not have a follow up with counselor or psychiatry until December of 2024. [facility] resent patient a list of BH resources within her area that accept her insurance per patient request. 10/05/23 Update- Patient reports not liking her new SSRI and has already discontinued medication without notifying her psychiatrist. Patient agreeable to contact provider today to discuss gaining a new psychotropic medication to treat her symptoms. Patient was provided vision resources for [facility] per patient request. Patient was provided brief coping skill education on anxiety and depression management. Patient was advised to contact her PCP as well because she is having issues with getting her pharmacy to approve her prescription for blood strips. Update- Patient's brother passed away tragically last week in an accident which has triggered her depression and grief. 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 Extensive grief management education provided. Patient declined wanting a referral for bereavement therapy at this time. Patient does not have any behavioral health appointments until February 2025. Patient reports that her pets and family members are her main sources of support right now. She shares that her daughter in law has arranged for a meal train through their church community to provide daily meals to the family during this time as they prepare for funeral arrangements. She denies any SI/HI. [facility] LCSW will follow up next month to follow up on grief support needs. 01/17/24 Update-[facility] LCSW successfully completed follow up call with patient today to re-assess [facility] needs. She shares that she is still grieving the loss of her brother but reports that overall, she is doing a lot better and has relocated into her daughter's home  in [place], [place]. She will to change her PCP as well due to this relocation and will be transferring to [facility] which is only 1-2 miles away from her new residence.  She is established with long term BH providers at [facility] and has follow up appointments with both psychiatry and counseling. She is aware of crisis support resources that she can utilize if ever needed. She was appreciative of the follow up call and is agreeable to social work case closure at this time as all clinical social work needs have been met. [facility] LCSW will sign off at this time.   Patient Goals/Self-Care Activities: Over the next 120 days Attend scheduled medical appointments Utilize healthy coping skills and supportive resources discussed Contact PCP with any questions or concerns Keep 90 percent of counseling appointments Call your insurance provider for more information about your Enhanced Benefits  Check out counseling resources provided  Begin personal counseling with LCSW, to reduce and manage symptoms of Depression and Stress, until well-established with mental health provider Accept all calls from representative with [facility] in an effort to establish ongoing mental health counseling and supportive services. Incorporate into daily practice - relaxation techniques, deep breathing exercises, and mindfulness meditation strategies. Talk about feelings with friends, family members, spiritual advisor, etc. Contact LCSW directly [phone]), if you have questions, need assistance, or if additional social work needs are identified between now and our next scheduled telephone outreach call. Call 988 for mental health hotline/crisis line if needed (24/7 available) Try techniques to reduce symptoms of anxiety/negative thinking (deep breathing, distraction, positive self talk, etc)  - develop a personal safety plan - develop a plan to deal with triggers like holidays, anniversaries - exercise at least 2 to 3 times per week - have a plan for how to handle bad days - journal feelings and what helps to feel better or worse - spend time or talk with others at least 2 to 3 times per week - watch  for early signs of feeling worse - begin personal counseling - call and visit an old friend - check out volunteer opportunities - join a support group - laugh; watch a funny movie or comedian - learn and use visualization or guided imagery - perform a random act of kindness - practice relaxation or meditation daily - start or continue a personal journal - practice positive thinking and self-talk -continue with compliance of taking medication  -identify current effective and ineffective coping strategies.  -implement positive self-talk in care to increase self-esteem, confidence and feelings of control.  -consider alternative and complementary therapy approaches such as meditation, mindfulness or yoga.  -journaling, prayer, worship services, meditation or pastoral counseling.  -increase participation in pleasurable group activities such as hobbies, singing, sports or volunteering).  -consider the use of meditative movement therapy such as tai chi, yoga or qigong.  -start a regular daily exercise program based on tolerance, ability and patient choice to support positive thinking and activity    If you are experiencing a Mental Health or Behavioral Health Crisis or need someone to talk to, please call the Suicide and Crisis Lifeline: 988    Patient Goals: Follow up goal     The following coping skill education was provided for stress relief and mental health management: "When your car dies or a deadline looms, how do you respond? Long-term, low-grade or acute stress takes a serious toll on your body and mind, so don't ignore feelings of constant tension. Stress is  a natural part of life. However, too much stress can harm our health, especially if it continues every day. This is chronic stress and can put you at risk for heart problems like heart disease and depression. Understand what's happening inside your body and learn simple coping skills to combat the negative impacts of everyday  stressors.  Types of Stress There are two types of stress: Emotional - types of emotional stress are relationship problems, pressure at work, financial worries, experiencing discrimination or having a major life change. Physical - Examples of physical stress include being sick having pain, not sleeping well, recovery from an injury or having an alcohol and drug use disorder. Fight or Flight Sudden or ongoing stress activates your nervous system and floods your bloodstream with adrenaline and cortisol, two hormones that raise blood pressure, increase heart rate and spike blood sugar. These changes pitch your body into a fight or flight response. That enabled our ancestors to outrun saber-toothed tigers, and it's helpful today for situations like dodging a car accident. But most modern chronic stressors, such as finances or a challenging relationship, keep your body in that heightened state, which hurts your health. Effects of Too Much Stress If constantly under stress, most of [place] will eventually start to function less well.  Multiple studies link chronic stress to a higher risk of heart disease, stroke, depression, weight gain, memory loss and even premature death, so it's important to recognize the warning signals. Talk to your doctor about ways to manage stress if you're experiencing any of these symptoms: Prolonged periods of poor sleep. Regular, severe headaches. Unexplained weight loss or gain. Feelings of isolation, withdrawal or worthlessness. Constant anger and irritability. Loss of interest in activities. Constant worrying or obsessive thinking. Excessive alcohol or drug use. Inability to concentrate.  10 Ways to Cope with Chronic Stress It's key to recognize stressful situations as they occur because it allows you to focus on managing how you react. We all need to know when to close our eyes and take a deep breath when we feel tension rising. Use these tips to prevent or reduce  chronic stress. 1. Rebalance Work and Home All work and no play? If you're spending too much time at the office, intentionally put more dates in your calendar to enjoy time for fun, either alone or with others. 2. Get Regular Exercise Moving your body on a regular basis balances the nervous system and increases blood circulation, helping to flush out stress hormones. Even a daily 20-minute walk makes a difference. Any kind of exercise can lower stress and improve your mood ? just pick activities that you enjoy and make it a regular habit. 3. Eat Well and Limit Alcohol and Stimulants Alcohol, nicotine and caffeine may temporarily relieve stress but have negative health impacts and can make stress worse in the long run. Well-nourished bodies cope better, so start with a good breakfast, add more organic fruits and vegetables for a well-balanced diet, avoid processed foods and sugar, try herbal tea and drink more water. 4. Connect with Supportive People Talking face to face with another person releases hormones that reduce stress. Lean on those good listeners in your life. 5. Carve Out Hobby Time Do you enjoy gardening, reading, listening to music or some other creative pursuit? Engage in activities that bring you pleasure and joy; research shows that reduces stress by almost half and lowers your heart rate, too. 6. Practice Meditation, Stress Reduction or Yoga Relaxation techniques activate a state of restfulness  that counterbalances your body's fight-or-flight hormones. Even if this also means a 10-minute break in a long day: listen to music, read, go for a walk in nature, do a hobby, take a bath or spend time with a friend. Also consider doing a mindfulness exercise or try a daily deep breathing or imagery practice. Deep Breathing Slow, calm and deep breathing can help you relax. Try these steps to focus on your breathing and repeat as needed. Find a comfortable position and close your eyes. Exhale  and drop your shoulders. Breathe in through your nose; fill your lungs and then your belly. Think of relaxing your body, quieting your mind and becoming calm and peaceful. Breathe out slowly through your nose, relaxing your belly. Think of releasing tension, pain, worries or distress. Repeat steps three and four until you feel relaxed. Imagery This involves using your mind to excite the senses -- sound, vision, smell, taste and feeling. This may help ease your stress. Begin by getting comfortable and then do some slow breathing. Imagine a place you love being at. It could be somewhere from your childhood, somewhere you vacationed or just a place in your imagination. Feel how it is to be in the place you're imagining. Pay attention to the sounds, air, colors, and who is there with you. This is a place where you feel cared for and loved. All is well. You are safe. Take in all the smells, sounds, tastes and feelings. As you do, feel your body being nourished and healed. Feel the calm that surrounds you. Breathe in all the good. Breathe out any discomfort or tension. 7. Sleep Enough If you get less than seven to eight hours of sleep, your body won't tolerate stress as well as it could. If stress keeps you up at night, address the cause, and add extra meditation into your day to make up for the lost z's. Try to get seven to nine hours of sleep each night. Make a regular bedtime schedule. Keep your room dark and cool. Try to avoid computers, TV, cell phones and tablets before bed. 8. Bond with Connections You Enjoy Go out for a coffee with a friend, chat with a neighbor, call a family member, visit with a clergy member, or even hang out with your pet. Clinical studies show that spending even a short time with a companion animal can cut anxiety levels almost in half. 9. Take a Vacation Getting away from it all can reset your stress tolerance by increasing your mental and emotional outlook, which makes you  a happier, more productive person upon return. Leave your cellphone and laptop at home! 10. See a Counselor, Coach or Therapist If negative thoughts overwhelm your ability to make positive changes, it's time to seek professional help. Make an appointment today--your health and life are worth it."     24- Hour Availability:    [facility]  [address] [place], [place] [phone] Crisis [phone]   [facility] [phone]  [place] [facility]  [phone]    [facility]  [phone] (after hours)   Therapeutic Alternative/Mobile Crisis   [phone]   [place] National Suicide Hotline  (570)743-9279 Len Childs) [place] [address]   Call [phone] for mental health emergencies   [facility]  [phone]);  [place] and [employer]  (563)421-2302); [place], [place], [place], [place], [place], [place], [place]    [facility] [employer]  [employer] Residents For 24/7 walk-up access to mental health services for [facility] children (4+), adolescents and adults, please visit the [facility] located at [address] in [place], [place].  *[facility] also provides comprehensive outpatient behavioral health services in a variety of locations around the [place].  Connect With [place] [address] [place], [place] [address] HelpLine: 352-674-3874 or 1-732-747-2601  Get Directions  Find Help 24/7 By Phone Call our 24-hour HelpLine at 808-387-1523 or 437-207-3846 for immediate assistance for mental health and substance abuse issues.  Walk-In Help [place]: [facility] (Ages 4 and Up) [place]: Emergency Dept., [facility] Additional Resources National Hopeline Network: 1-800-SUICIDE The National Suicide Prevention Lifeline: 9-629-528-UXLK     Dickie La,  BSW, MSW, LCSW Licensed Clinical Social Worker [employer]   [facility] [email or web address] Direct Dial: [phone]

## 2024-01-17 NOTE — Patient Outreach (Signed)
Medicaid Managed Care Social Work Note  01/17/2024 Name:  Danielle Hurley MRN:  [id number] DOB:  11/05/1961  Danielle Hurley is an 63 y.o. year old female who is a primary patient of Danielle Hurley, [place].  The Medicaid Managed Care Coordination team was consulted for assistance with:  Mental Health Counseling and Resources  Danielle Hurley was given information about Medicaid Managed Care Coordination team services today. Danielle Hurley Patient agreed to services and verbal consent obtained.  Engaged with patient  for by telephone forfollow up visit in response to referral for case management and/or care coordination services.   Patient is participating in a Managed Medicaid Plan:  Yes  Assessments/Interventions:  Review of past medical history, allergies, medications, health status, including review of consultants reports, laboratory and other test data, was performed as part of comprehensive evaluation and provision of chronic care management services.  SDOH: (Social Drivers of Health) assessments and interventions performed: SDOH Interventions    Flowsheet Row Patient Outreach Telephone from 01/17/2024 in Danielle [facility] POPULATION HEALTH DEPARTMENT Office Visit from 11/10/2023 in [facility] at [facility] Patient Outreach Telephone from 11/05/2023 in [place] POPULATION HEALTH DEPARTMENT Patient Outreach Telephone from 10/05/2023 in [place] POPULATION HEALTH DEPARTMENT Patient Outreach Telephone from 09/14/2023 in [place] POPULATION HEALTH DEPARTMENT Patient Outreach Telephone from 08/12/2023 in [place] POPULATION HEALTH DEPARTMENT  SDOH Interventions        Housing Interventions -- -- Other (Comment)  [Housing Resources have been provided] -- -- --  Depression Interventions/Treatment  -- Currently on Treatment, Medication -- -- -- --  Financial Strain Interventions -- -- -- Intervention Not Indicated -- --  Stress Interventions Provide Counseling, Community Resources Provided  -- Provide Counseling, Offered [employer]  [Brother passed away recently in a tragic accident] [employer], Provide Counseling  [employer] meds are not working] Provide Counseling, Offered [employer]  [Recent medication change] [employer], Provide Counseling       Advanced Directives Status:  See Care Plan for related entries.  Care Plan                 No Known Allergies  Medications Reviewed Today     Reviewed by [name], LCSW (Social Worker) on 01/17/24 at 1114  Med List Status: <None>   Medication Order Taking? Sig Documenting Provider Last Dose Status Informant  Accu-Chek Softclix Lancets lancets [id number] No USE 1  TO CHECK GLUCOSE IN THE MORNING AND 1 AT NOON AND 1 AT BEDTIME Saralyn Pilar A, PA Taking Active   acetaminophen (TYLENOL) 325 MG tablet [id number] No Take 650 mg by mouth every 6 (six) hours as needed. [provider] Taking Active   aspirin-acetaminophen-caffeine (EXCEDRIN MIGRAINE) 770 510 1488 MG tablet [id number] No Take 1 tablet by mouth every 6 (six) hours as needed for headache. [provider] Taking Active Self  atorvastatin (LIPITOR) 20 MG tablet [id number]  Take 1 tablet (20 mg total) by mouth daily. Danielle Quitter, PA  Active   Bismuth Subsalicylate (KAOPECTATE) 262 MG TABS [id number] No Take 262 mg by mouth daily as needed (upset stomach). [provider] Taking Active Self  blood glucose meter kit and supplies [id number] No Dispense based on patient and insurance preference. Use up to four times daily as directed. (FOR ICD-10 E10.9, E11.9). Carlean Jews, NP Taking Active   Blood Glucose Monitoring Suppl DEVI [id number] No 1 each by Does not apply route in the morning, at noon,  and at bedtime. May substitute to any manufacturer covered by patient's insurance. Danielle Quitter, PA Taking Active   celecoxib (CELEBREX) 200 MG capsule [id number] No  TAKE 1 CAPSULE BY MOUTH TWICE DAILY AS NEEDED Saralyn Pilar A, PA Taking Active   cetirizine (ZYRTEC) 10 MG tablet [id number] No Take 10 mg by mouth daily as needed for allergies. [provider] Taking Active Self  Cholecalciferol (VITAMIN D) 50 MCG (2000 UT) tablet [id number] No Take 2,000 Units by mouth daily. [provider] Taking Active Self  cyclobenzaprine (FLEXERIL) 5 MG tablet [id number]  Take 1 tablet (5 mg total) by mouth 3 (three) times daily as needed. for muscle spams Danielle Quitter, PA  Active   Dulaglutide (TRULICITY) 3 MG/0.5ML Ivory Broad [id number]  Inject 3 mg into the skin once a week. Danielle Quitter, PA  Active   FLUoxetine (PROZAC) 10 MG capsule [id number] No Take 1 capsule (10 mg total) by mouth daily. Augusto Gamble, MD Taking Active   glimepiride (AMARYL) 4 MG tablet [id number]  Take 1 tablet (4 mg total) by mouth 2 (two) times daily. Saralyn Pilar A, PA  Active   glucose blood (TRUETRACK TEST) test strip [id number] No Bloodsugar testing QD and prn Danielle Quitter, PA Taking Active   guaifenesin (HUMIBID E) 400 MG TABS tablet [id number] No Take 400 mg by mouth daily as needed. [provider] Taking Active Self  LAMICTAL 25 MG tablet [id number] No Take one 25 mg tablet daily for 14 days Then take two 25 mg tablet daily for 14 days Then take one 100 mg tablet daily for 7 days Then take one and a half 100 mg tablets daily for 7 days Then take two 100 mg tablets daily for 7 days Augusto Gamble, MD Taking Active   metFORMIN (GLUCOPHAGE-XR) 500 MG 24 hr tablet [id number]  Take 1 tablet (500 mg total) by mouth 2 (two) times daily with a meal. Danielle Quitter, PA  Active   mupirocin ointment (BACTROBAN) 2 % [id number] No Apply small amount to effected area twice daily for 2 weeks then as needed Carlean Jews, NP Taking Active Self  oxybutynin (DITROPAN) 5 MG tablet [id number]  Take 1 tablet (5 mg total) by mouth daily. Saralyn Pilar A, PA  Active    pantoprazole (PROTONIX) 40 MG tablet [id number]  Take 1 tablet (40 mg total) by mouth daily. Danielle Quitter, PA  Active   valsartan-hydrochlorothiazide (DIOVAN-HCT) 320-12.5 MG tablet [id number]  Take 1 tablet by mouth daily. Danielle Quitter, PA  Active             Patient Active Problem List   Diagnosis Date Noted   Severe major depression without psychotic features (HCC) 01/08/2024   Tobacco use disorder, severe, in sustained remission 07/21/2023   MDD (major depressive disorder) 06/02/2023   GAD (generalized anxiety disorder) 06/02/2023   Chronic left-sided low back pain with left-sided sciatica 07/16/2022   Restless leg syndrome 07/16/2022   Leg cramps 07/16/2022   Gastroesophageal reflux disease without esophagitis 05/31/2022   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   Overactive bladder 11/02/2021   Hepatic cirrhosis (HCC) 10/21/2021   Pulmonary nodule 08/19/2021   Body mass index 27.0-27.9, adult 08/19/2021   TB of lung w/ cavitation 05/08/2021   Hypertension associated with diabetes (HCC) 03/31/2021   Hyperlipidemia associated with type 2 diabetes mellitus (HCC) 03/31/2021   History of active tuberculosis 03/31/2021   Chronic hepatitis C without hepatic coma (HCC) 03/31/2021   Chronic left  shoulder pain 03/31/2021   Type 2 diabetes mellitus with hyperglycemia, without long-term current use of insulin (HCC) 03/31/2021    Conditions to be addressed/monitored per PCP order:  Anxiety and Depression  Care Plan : LCSW Plan of Care  Updates made by [name], LCSW since 01/17/2024 12:00 AM     Problem: Depression Identification (Depression)      Goal: Depressive Symptoms Identified   Note:   Priority: High  Timeframe:  Short-Range Goal Priority:  High Start Date:   04/30/23          Expected End Date:  ongoing                     Follow Up Date--Goal met and ended on 01/17/24  - keep 90 percent of scheduled appointments -consider counseling or  psychiatry -consider bumping up your self-care  -consider creating a stronger support network   Why is this important?             Combatting depression may take some time.            If you don't feel better right away, don't give up on your treatment plan.    Current barriers:   Chronic Mental Health needs related to depression, stress and caregiver stress. Patient requires Support, Education, Resources, Referrals, Advocacy, and Care Coordination, in order to meet Unmet Mental Health Needs. Patient will implement clinical interventions discussed today to decrease symptoms of depression and increase knowledge and/or ability of: coping skills. Mental Health Concerns and Social Isolation Patient lacks knowledge of available community counseling agencies and resources.  Clinical Goal(s): verbalize understanding of plan for management of Anxiety, Depression, and Stress and demonstrate a reduction in symptoms. Patient will connect with a provider for ongoing mental health treatment, increase coping skills, healthy habits, self-management skills, and stress reduction        Clinical Interventions:  Assessed patient's previous and current treatment, coping skills, support system and barriers to care. Patient provided hx. Patient is the primary caregiver of her deaf brother who resides with her. She reports that family came from [place] 3 years ago. Patient reports 3 or 4 adult kids live nearby. Verbalization of feelings encouraged, motivational interviewing employed Emotional support provided, positive coping strategies explored. Establishing healthy boundaries emphasized and healthy self-care education provided Patient was educated on available mental health resources within their area that accept Medicaid and offer counseling and psychiatry. Email sent to patient today with available mental health resources within her area that accept Medicaid and offer the services that she is interested in.  Email included instructions for scheduling at [facility] as well as some crisis support resources and [facility]'s walk in clinic hours Patient reports significant worsening anxiety and depression affecting her ability to function appropriately and carry out daily task. She reports having trouble getting out of the bed some days.  Patient receives strong support from spouse, MIL, and mentor LCSW provided education on relaxation techniques such as meditation, deep breathing, massage, grounding exercises or yoga that can activate the body's relaxation response and ease symptoms of stress and anxiety. LCSW ask that when pt is struggling with difficult emotions and racing thoughts that they start this relaxation response process. LCSW provided extensive education on healthy coping skills for anxiety. SW used active and reflective listening, validated patient's feelings/concerns, and provided emotional support. Patient will work on implementing appropriate self-care habits into their daily routine such as: staying positive, writing a gratitude list, drinking water,  staying active around the house, taking their medications as prescribed, combating negative thoughts or emotions and staying connected with their family and friends. Positive reinforcement provided for this decision to work on this. LCSW provided education on healthy sleep hygiene and what that looks like. LCSW encouraged patient to implement a night time routine into their schedule that works best for them and that they are able to maintain. Advised patient to implement deep breathing/grounding/meditation/self-care exercises into their nightly routine to combat racing thoughts at night. LCSW encouraged patient to wake up at the same time each day, make their sleeping environment comfortable, exercise when able, to limit naps and to not eat or drink anything right before bed.  Motivational Interviewing employed Depression screen reviewed  PHQ2/ PHQ9 completed  or reviewed  Mindfulness or Relaxation training provided Active listening / Reflection utilized  Advance Care and HCPOA education provided Emotional Support Provided Problem Solving /Task Center strategies reviewed Provided psychoeducation for mental health needs  Provided brief CBT  Reviewed mental health medications and discussed importance of compliance:  Quality of sleep assessed & Sleep Hygiene techniques promoted  Participation in counseling encouraged  Verbalization of feelings encouraged  Suicidal Ideation/Homicidal Ideation assessed: Patient denies SI/HI  Review resources, discussed options and provided patient information about  Mental Health Resources Inter-disciplinary care team collaboration (see longitudinal plan of care) 05/12/23- Patient reviewed list and is agreeable to referral to [facility] as she wishes to stay within the [facility] network. She reports that they received some great health related news regarding her brother which has helped them reduce traveling back and forth to those necessary medical appointments. 05/28/23- Patient states that [facility] called her and she has scheduled both her psychiatry and counseling appointment. She reports that she psychiatry appointment is with a female and she prefers females providers but desired to get the earliest appointment for medication management. Patient denies any current crises at the time. She is agreeable to [facility] LCSW contacting her on 06/04/23 to follow up.and ensure mental health support was established. Update- Patient recently completed her initial psychiatry visit on 06/02/23. She reports that she was prescribed zoloft but she and her daughter as now hesitant about her taking this new medication. Patient as advised to contact [facility] and PCP office for further information/direction on new psychiatric medication. Patient was appreciative of call and support. Patient will start counseling at [facility] next month. Update- Patient reports that  she has decided to discontinue zoloft because she did research on medication side effects and read that she is unable to take tylenol with medication. [facility] LCSW strongly encouraged her to contact her provider at [facility] to follow up on this and to contact her prescriber for questions/concerns before she changes her psychiatric medications next time she decides to abruptly stop a medicine. Patient expressed understanding. Brief self-care and coping skill education provided. 07/09/23- Patient reports doing well but she is struggling with chronic lower back pain which triggers her stress and depression. Advised patient to exercise 10-20 minutes everyday, go outside in the sunlight everyday for at least 10-15 minutes, drink 6-8 glasses of water a day, take vitamins/medications as directed and try to increase sleep. Patient has her initial psychiatry and counseling appointments on 07/20/23 and 07/21/23 and reports that her daughter will transport her to these appointments. [facility] LCSW will follow up in three weeks. Update- Patient is now established with long term mental health providers at [facility]. Patient reports that she was put on Prozac and was told by her psychiatrist  that she can still take ibuprofen with this but when she researched this online, she found differently. [facility] LCSW will message her psychiatrist inquiring about this concern. Patient looks forward to her next therapy appointment on 08/11/23. She reports no urgent matters at this time. Patient was educated on healthy self care and was encouraged to continue making a daily effort implementing these into her routine. Update- Patient contacted [facility] and spoke with her psychiatry and adjusted her medication successfully. Patient reports that dosage of the new medication is complicated but she is getting some assistance with her family. She was advised to continue to speak with her pharmacist and providers about any medication related concerns or questions she may have.  Patient does not have a follow up with counselor or psychiatry until December of 2024. [facility] resent patient a list of BH resources within her area that accept her insurance per patient request. 10/05/23 Update- Patient reports not liking her new SSRI and has already discontinued medication without notifying her psychiatrist. Patient agreeable to contact provider today to discuss gaining a new psychotropic medication to treat her symptoms. Patient was provided vision resources for [facility] per patient request. Patient was provided brief coping skill education on anxiety and depression management. Patient was advised to contact her PCP as well because she is having issues with getting her pharmacy to approve her prescription for blood strips. Update- Patient's brother passed away tragically last week in an accident which has triggered her depression and grief. 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 Extensive grief management education provided. Patient declined wanting a referral for bereavement therapy at this time. Patient does not have any behavioral health appointments until February 2025. Patient reports that her pets and family members are her main sources of support right now. She shares that her daughter in law has arranged for a meal train through their church community to provide daily meals to the family during this time as they prepare for funeral arrangements. She denies any SI/HI. [facility] LCSW will follow up next month to follow up on grief support needs. 01/17/24 Update-[facility] LCSW successfully completed follow up call with patient today to re-assess [facility] needs. She shares that she is still grieving the loss of her brother but reports that overall, she is doing a lot better and has relocated into her daughter's home in [place], [place]. She will to change her PCP as well due to this relocation and will be transferring to [facility] which is only 1-2 miles away from her new  residence. She is established with long term BH providers at [facility] and has follow up appointments with both psychiatry and counseling. She is aware of crisis support resources that she can utilize if ever needed. She was appreciative of the follow up call and is agreeable to social work case closure at this time as all clinical social work needs have been met. [facility] LCSW will sign off at this time.   Patient Goals/Self-Care Activities: Over the next 120 days Attend scheduled medical appointments Utilize healthy coping skills and supportive resources discussed Contact PCP with any questions or concerns Keep 90 percent of counseling appointments Call your insurance provider for more information about your Enhanced Benefits  Check out counseling resources provided  Begin personal counseling with LCSW, to reduce and manage symptoms of Depression and Stress, until well-established with mental health provider Accept all calls from representative with [facility] in an effort to establish ongoing mental health  counseling and supportive services. Incorporate into daily practice - relaxation techniques, deep breathing exercises, and mindfulness meditation strategies. Talk about feelings with friends, family members, spiritual advisor, etc. Contact LCSW directly [phone]), if you have questions, need assistance, or if additional social work needs are identified between now and our next scheduled telephone outreach call. Call 988 for mental health hotline/crisis line if needed (24/7 available) Try techniques to reduce symptoms of anxiety/negative thinking (deep breathing, distraction, positive self talk, etc)  - develop a personal safety plan - develop a plan to deal with triggers like holidays, anniversaries - exercise at least 2 to 3 times per week - have a plan for how to handle bad days - journal feelings and what helps to feel better or worse - spend time or talk with others at least 2 to 3 times per  week - watch for early signs of feeling worse - begin personal counseling - call and visit an old friend - check out volunteer opportunities - join a support group - laugh; watch a funny movie or comedian - learn and use visualization or guided imagery - perform a random act of kindness - practice relaxation or meditation daily - start or continue a personal journal - practice positive thinking and self-talk -continue with compliance of taking medication  -identify current effective and ineffective coping strategies.  -implement positive self-talk in care to increase self-esteem, confidence and feelings of control.  -consider alternative and complementary therapy approaches such as meditation, mindfulness or yoga.  -journaling, prayer, worship services, meditation or pastoral counseling.  -increase participation in pleasurable group activities such as hobbies, singing, sports or volunteering).  -consider the use of meditative movement therapy such as tai chi, yoga or qigong.  -start a regular daily exercise program based on tolerance, ability and patient choice to support positive thinking and activity    If you are experiencing a Mental Health or Behavioral Health Crisis or need someone to talk to, please call the Suicide and Crisis Lifeline: 988    Patient Goals: Follow up goal     Follow up:  Patient requests no follow-up at this time.  Plan: The Managed Medicaid care management team is available to follow up with the patient after provider conversation with the patient regarding recommendation for care management engagement and subsequent re-referral to the care management team.   Dickie La, BSW, MSW, LCSW Licensed Clinical Social Worker [place]   [facility] [email or web address] Direct Dial: [phone]

## 2024-02-01 ENCOUNTER — Other Ambulatory Visit: Payer: Self-pay | Admitting: Family Medicine

## 2024-02-01 DIAGNOSIS — E119 Type 2 diabetes mellitus without complications: Secondary | ICD-10-CM

## 2024-02-01 DIAGNOSIS — E1165 Type 2 diabetes mellitus with hyperglycemia: Secondary | ICD-10-CM

## 2024-02-01 MED ORDER — TRUETRACK TEST VI STRP: ORAL_STRIP | 12 refills | Status: DC

## 2024-02-01 MED ORDER — TRULICITY 3 MG/0.5ML ~~LOC~~ SOAJ: 3.0000 mg | SUBCUTANEOUS | 1 refills | Status: DC

## 2024-02-01 NOTE — Telephone Encounter (Signed)
 Copied from CRM [id number]. Topic: Clinical - Medication Refill >> Feb 01, 2024  1:13 PM Danielle Hurley wrote: Most Recent Primary Care Visit:  Provider: Saralyn Pilar A  Department: [facility]  Visit Type: OFFICE VISIT 20  Date: 11/10/2023  Medication: Dulaglutide (TRULICITY) 3 MG/0.5ML [facility] glucose blood (TRUETRACK TEST) test strip   Has the patient contacted their pharmacy? Yes (Agent: If no, request that the patient contact the pharmacy for the refill. If patient does not wish to contact the pharmacy document the reason why and proceed with request.) (Agent: If yes, when and what did the pharmacy advise?)  Is this the correct pharmacy for this prescription? Yes If no, delete pharmacy and type the correct one.  This is the patient'Hurley preferred pharmacy:  [id number] [place], [place] - [address] [place] [address] Phone: [phone] Fax: [phone]     Has the prescription been filled recently? No  Is the patient out of the medication? Yes  Has the patient been seen for an appointment in the last year OR does the patient have an upcoming appointment? Yes  Can we respond through MyChart? Yes  Agent: Please be advised that Rx refills may take up to 3 business days. We ask that you follow-up with your pharmacy.

## 2024-02-01 NOTE — Telephone Encounter (Signed)
 Last Fill: Trulicity: 12/20/23 2 mL/1 RF     Test Strips: 08/11/23  Last OV: 11/10/23 Next OV: 03/07/24  Routing to provider for review/authorization.

## 2024-02-15 ENCOUNTER — Other Ambulatory Visit: Payer: Medicaid Other

## 2024-02-18 ENCOUNTER — Ambulatory Visit (HOSPITAL_COMMUNITY): Payer: Medicaid Other | Admitting: Mental Health

## 2024-02-18 DIAGNOSIS — F411 Generalized anxiety disorder: Secondary | ICD-10-CM

## 2024-02-18 DIAGNOSIS — F322 Major depressive disorder, single episode, severe without psychotic features: Secondary | ICD-10-CM

## 2024-02-18 NOTE — Progress Notes (Signed)
   THERAPIST PROGRESS NOTE Virtual Visit via Video Note  I connected with Danielle Hurley on 02/18/24 at  9:00 AM EDT by a video enabled telemedicine application and verified that I am speaking with the correct person using two identifiers.  Location: Patient: home address on file Provider: office   I discussed the limitations of evaluation and management by telemedicine and the availability of in person appointments. The patient expressed understanding and agreed to proceed.  I discussed the assessment and treatment plan with the patient. The patient was provided an opportunity to ask questions and all were answered. The patient agreed with the plan and demonstrated an understanding of the instructions.   The patient was advised to call back or seek an in-person evaluation if the symptoms worsen or if the condition fails to improve as anticipated.  I provided 22 minutes of non-face-to-face time during this encounter.   Danielle Hurley, [facility]   Session Time: 9:03 am ( 22 minutes)   Participation Level: Active  Behavioral Response: CasualAlertWNL  Type of Therapy: Individual Therapy  Treatment Goals addressed: STG: Annastacia will increase management of depressive sxs AEB development of effective coping skills with ability to process thoughts in balanced manner per self report with the next 90 days.   ProgressTowards Goals: Progressing  Interventions: Supportive  Summary: Danielle Hurley is a 63 y.o. female who presents with dx  of major depression and generalized anxiety disorder. Danielle Hurley presents for session alert and oriented; mood and affect stable; lethargic. Speech clear and coherent at normal rare and tone. Engaged and receptive to interventions. Shares to be tired with poor sleep previous night due to frequent night time waking. Shares for moods to have been stable with period of feelings of grief of passing of brother. Notes ongoing adjustment to living with daughter but shares  for this to be going well and was able to keep pet dogs. Shares can spend most time in her room. Notes living with daughter has been good for her diet with increase in quality of nutrition. Notes overall to be doing well and denies concerns at this time.   Suicidal/Homicidal: Nowithout intent/plan  Therapist Response: Therapist engaged [place] in therapy session. Completed check in and assessed for current level of functioning sxs management and level of stressors. Provided safe space to share thoughts and feelings in regards to feelings of grief and normalized feelings. Supported in exploring working to adjust to new living arrangement. Encouraged ongoing engagement with family and not isolating in room. Reviewed session and provided follow up.   Plan: Return again in  x 8 weeks.  Diagnosis: Severe major depression without psychotic features (HCC)  GAD (generalized anxiety disorder)  Collaboration of Care: Other None  Patient/Guardian was advised Release of Information must be obtained prior to any record release in order to collaborate their care with an outside provider. Patient/Guardian was advised if they have not already done so to contact the registration department to sign all necessary forms in order for [place] to release information regarding their care.   Consent: Patient/Guardian gives verbal consent for treatment and assignment of benefits for services provided during this visit. Patient/Guardian expressed understanding and agreed to proceed.   Stephan Minister Spicer, [facility] 02/18/2024

## 2024-02-21 ENCOUNTER — Telehealth: Payer: Self-pay | Admitting: *Deleted

## 2024-02-21 NOTE — Telephone Encounter (Signed)
 PA started for the below medication.

## 2024-02-21 NOTE — Telephone Encounter (Signed)
 Copied from CRM [id number]. Topic: Clinical - Prescription Issue >> Feb 18, 2024  1:31 PM Desma Mcgregor wrote: Reason for CRM: Patient says that for her Dulaglutide (TRULICITY) 3 MG/0.5ML SOAJ, it requires a PA through the insurance. Patient is out and she will be missing her second dose. Please follow up asap.

## 2024-02-22 ENCOUNTER — Ambulatory Visit: Payer: Medicaid Other | Admitting: Family Medicine

## 2024-03-07 ENCOUNTER — Encounter: Payer: Self-pay | Admitting: Family Medicine

## 2024-03-07 ENCOUNTER — Ambulatory Visit: Payer: Medicaid Other | Admitting: Family Medicine

## 2024-03-07 VITALS — BP 119/78 | HR 88 | Ht 71.0 in | Wt 182.2 lb

## 2024-03-07 DIAGNOSIS — E1165 Type 2 diabetes mellitus with hyperglycemia: Secondary | ICD-10-CM | POA: Diagnosis not present

## 2024-03-07 DIAGNOSIS — Z0000 Encounter for general adult medical examination without abnormal findings: Secondary | ICD-10-CM | POA: Diagnosis not present

## 2024-03-07 DIAGNOSIS — R9431 Abnormal electrocardiogram [ECG] [EKG]: Secondary | ICD-10-CM | POA: Diagnosis not present

## 2024-03-07 DIAGNOSIS — K219 Gastro-esophageal reflux disease without esophagitis: Secondary | ICD-10-CM | POA: Diagnosis not present

## 2024-03-07 DIAGNOSIS — Z13820 Encounter for screening for osteoporosis: Secondary | ICD-10-CM

## 2024-03-07 DIAGNOSIS — E785 Hyperlipidemia, unspecified: Secondary | ICD-10-CM

## 2024-03-07 DIAGNOSIS — Z780 Asymptomatic menopausal state: Secondary | ICD-10-CM

## 2024-03-07 DIAGNOSIS — Z7984 Long term (current) use of oral hypoglycemic drugs: Secondary | ICD-10-CM

## 2024-03-07 DIAGNOSIS — R911 Solitary pulmonary nodule: Secondary | ICD-10-CM | POA: Diagnosis not present

## 2024-03-07 DIAGNOSIS — Z23 Encounter for immunization: Secondary | ICD-10-CM

## 2024-03-07 DIAGNOSIS — E1169 Type 2 diabetes mellitus with other specified complication: Secondary | ICD-10-CM

## 2024-03-07 DIAGNOSIS — I152 Hypertension secondary to endocrine disorders: Secondary | ICD-10-CM | POA: Diagnosis not present

## 2024-03-07 DIAGNOSIS — E1159 Type 2 diabetes mellitus with other circulatory complications: Secondary | ICD-10-CM | POA: Diagnosis not present

## 2024-03-07 DIAGNOSIS — Z79899 Other long term (current) drug therapy: Secondary | ICD-10-CM | POA: Diagnosis not present

## 2024-03-07 DIAGNOSIS — E559 Vitamin D deficiency, unspecified: Secondary | ICD-10-CM | POA: Diagnosis not present

## 2024-03-07 DIAGNOSIS — E119 Type 2 diabetes mellitus without complications: Secondary | ICD-10-CM

## 2024-03-07 MED ORDER — VALSARTAN 320 MG PO TABS: 320.0000 mg | ORAL_TABLET | Freq: Every day | ORAL | 3 refills | Status: AC

## 2024-03-07 MED ORDER — TRUETRACK TEST VI STRP: ORAL_STRIP | 12 refills | Status: AC

## 2024-03-07 NOTE — Progress Notes (Signed)
 New Patient Office Visit  Subjective    Patient ID: Danielle Hurley, female    DOB: 1961/01/30  Age: 63 y.o. MRN: [id number]  CC:  Chief Complaint  Patient presents with   Establish Care    Would like to discuss medications, Pt. Reports that the valsartan is making her dehydrated and constipated.  Pt. Has bend in left thumb nail that is painful when touched.  Has agreed to immunizations today   Referrals for  Colonoscopy [facility] Ophthalmology Lung cancer screening      HPI Danielle Hurley presents to establish care. Oriented to practice routines and expectations. Has been seeing a PCP regularly. PMH includes arthritis, COPD, HLD, HTN, DM2, GERD, Hepatitis C, and TB. Concerns include medication management, she believes her dry mouth is due to HCTZ. She does see psychiatry. Denies tobacco use, alcohol, drugs, STI screening. Was previously referred to cardiology for "abnormal EKG" and did not complete this referral, would like to be sent back.   HTN: recent dose increase in Valsartan-hydrochlorothiazide, reports dry mouth, not monitoring BP at home, denies chest pain, palpitations, recurrent headaches, vision changes, lightheadedness, dizziness, dyspnea on exertion, or swelling of extremities.  HLD: well controlled on 10mg  atorvastatin, regular diet, no exercise  DM: not monitoring, last A1c 11.2, endorses extreme thirst, tolerating Trulicity 3mg  weekly, metformin 500mg  BID and amaryl 4mg  BID.  Anxiety: seeing therapist and psychiatry  GERD: well controlled on Protonix  Health Maintenance  Topic Date Due   Medicare Annual Wellness (AWV)  Never done   OPHTHALMOLOGY EXAM  Never done   DTaP/Tdap/Td (1 - Tdap) Never done   Lung Cancer Screening  08/05/2022   Pneumonia Vaccine 86+ Years old (2 of 2 - PCV) 09/19/2022   DEXA SCAN  Never done   COVID-19 Vaccine (1 - 2024-25 season) 03/23/2024 (Originally 08/01/2023)   FOOT EXAM  04/13/2024   HEMOGLOBIN A1C  05/10/2024   Diabetic kidney  evaluation - eGFR measurement  05/31/2024   INFLUENZA VACCINE  06/30/2024   Diabetic kidney evaluation - Urine ACR  11/09/2024   MAMMOGRAM  08/31/2025   Cervical Cancer Screening (HPV/Pap Cotest)  11/26/2026   Colonoscopy  03/21/2033   Hepatitis C Screening  Completed   HIV Screening  Completed   Zoster Vaccines- Shingrix  Completed   HPV VACCINES  Aged Out      Outpatient Encounter Medications as of 03/07/2024  Medication Sig   Accu-Chek Softclix Lancets lancets USE 1  TO CHECK GLUCOSE IN THE MORNING AND 1 AT NOON AND 1 AT BEDTIME   acetaminophen (TYLENOL) 325 MG tablet Take 650 mg by mouth every 6 (six) hours as needed.   aspirin-acetaminophen-caffeine (EXCEDRIN MIGRAINE) 250-250-65 MG tablet Take 1 tablet by mouth every 6 (six) hours as needed for headache.   atorvastatin (LIPITOR) 20 MG tablet Take 1 tablet (20 mg total) by mouth daily.   Bismuth Subsalicylate (KAOPECTATE) 262 MG TABS Take 262 mg by mouth daily as needed (upset stomach).   blood glucose meter kit and supplies Dispense based on patient and insurance preference. Use up to four times daily as directed. (FOR ICD-10 E10.9, E11.9).   Blood Glucose Monitoring Suppl DEVI 1 each by Does not apply route in the morning, at noon, and at bedtime. May substitute to any manufacturer covered by patient's insurance.   cetirizine (ZYRTEC) 10 MG tablet Take 10 mg by mouth daily as needed for allergies.   Cholecalciferol (VITAMIN D) 50 MCG (2000 UT) tablet Take 2,000 Units by mouth daily.  cyclobenzaprine (FLEXERIL) 5 MG tablet Take 1 tablet (5 mg total) by mouth 3 (three) times daily as needed. for muscle spams   Dulaglutide (TRULICITY) 3 MG/0.5ML SOAJ Inject 3 mg into the skin once a week.   glimepiride (AMARYL) 4 MG tablet Take 1 tablet (4 mg total) by mouth 2 (two) times daily.   metFORMIN (GLUCOPHAGE-XR) 500 MG 24 hr tablet Take 1 tablet (500 mg total) by mouth 2 (two) times daily with a meal.   pantoprazole (PROTONIX) 40 MG tablet  Take 1 tablet (40 mg total) by mouth daily.   valsartan (DIOVAN) 320 MG tablet Take 1 tablet (320 mg total) by mouth daily.   [DISCONTINUED] glucose blood (TRUETRACK TEST) test strip Bloodsugar testing QD and prn   [DISCONTINUED] guaifenesin (HUMIBID E) 400 MG TABS tablet Take 400 mg by mouth daily as needed.   [DISCONTINUED] mupirocin ointment (BACTROBAN) 2 % Apply small amount to effected area twice daily for 2 weeks then as needed   [DISCONTINUED] valsartan-hydrochlorothiazide (DIOVAN-HCT) 320-12.5 MG tablet Take 1 tablet by mouth daily.   glucose blood (TRUETRACK TEST) test strip Bloodsugar testing QD and prn   oxybutynin (DITROPAN) 5 MG tablet Take 1 tablet (5 mg total) by mouth daily. (Patient not taking: Reported on 03/07/2024)   [DISCONTINUED] celecoxib (CELEBREX) 200 MG capsule TAKE 1 CAPSULE BY MOUTH TWICE DAILY AS NEEDED   [DISCONTINUED] FLUoxetine (PROZAC) 10 MG capsule Take 1 capsule (10 mg total) by mouth daily.   [DISCONTINUED] LAMICTAL 25 MG tablet Take one 25 mg tablet daily for 14 days Then take two 25 mg tablet daily for 14 days Then take one 100 mg tablet daily for 7 days Then take one and a half 100 mg tablets daily for 7 days Then take two 100 mg tablets daily for 7 days   No facility-administered encounter medications on file as of 03/07/2024.    Past Medical History:  Diagnosis Date   Arthritis    COPD (chronic obstructive pulmonary disease) (HCC)    Diabetes mellitus without complication (HCC)    Dyspnea    GERD (gastroesophageal reflux disease)    Hepatitis C    treated 2022   High cholesterol    History of kidney stones    Hypertension    Tuberculosis    non contagious Dx 05/2020    Past Surgical History:  Procedure Laterality Date   CHOLECYSTECTOMY  1998   COLONOSCOPY  03/22/2023   first colonoscopy   TUBAL LIGATION  01/1995   VIDEO BRONCHOSCOPY WITH ENDOBRONCHIAL NAVIGATION N/A 09/04/2021   Procedure: VIDEO BRONCHOSCOPY WITH ENDOBRONCHIAL NAVIGATION;   Surgeon: Loreli Slot, MD;  Location: [facility] OR;  Service: Thoracic;  Laterality: N/A;    Family History  Adopted: Yes  Problem Relation Age of Onset   Lung cancer Brother    Colon cancer Neg Hx    Stomach cancer Neg Hx    Esophageal cancer Neg Hx    Colon polyps Neg Hx     Social History   Socioeconomic History   Marital status: Widowed    Spouse name: Not on file   Number of children: 4   Years of education: Not on file   Highest education level: 12th grade  Occupational History   Not on file  Tobacco Use   Smoking status: Former    Current packs/day: 0.00    Average packs/day: 1 pack/day for 40.0 years (40.0 ttl pk-yrs)    Types: Cigarettes    Start date: 54  Quit date: 2014    Years since quitting: 11.2    Passive exposure: Past   Smokeless tobacco: Never  Vaping Use   Vaping status: Never Used  Substance and Sexual Activity   Alcohol use: Not Currently    Comment: none since hep c dx   Drug use: Not Currently    Types: Marijuana, Cocaine, Methamphetamines, IV   Sexual activity: Not Currently  Other Topics Concern   Not on file  Social History Narrative   Not on file   Social Drivers of Health   Financial Resource Strain: Low Risk  (03/03/2024)   Overall Financial Resource Strain (CARDIA)    Difficulty of Paying Living Expenses: Not hard at all  Food Insecurity: No Food Insecurity (03/03/2024)   Hunger Vital Sign    Worried About Running Out of Food in the Last Year: Never true    Ran Out of Food in the Last Year: Never true  Transportation Needs: No Transportation Needs (03/03/2024)   PRAPARE - Administrator, Civil Service (Medical): No    Lack of Transportation (Non-Medical): No  Physical Activity: Insufficiently Active (03/03/2024)   Exercise Vital Sign    Days of Exercise per Week: 5 days    Minutes of Exercise per Session: 20 min  Stress: Stress Concern Present (03/03/2024)   [employer] of Occupational Health - Occupational  Stress Questionnaire    Feeling of Stress : To some extent  Social Connections: Socially Isolated (03/03/2024)   Social Connection and Isolation Panel [NHANES]    Frequency of Communication with Friends and Family: Once a week    Frequency of Social Gatherings with Friends and Family: More than three times a week    Attends Religious Services: Never    Database administrator or Organizations: No    Attends Banker Meetings: Never    Marital Status: Widowed  Intimate Partner Violence: Not At Risk (01/19/2023)   Humiliation, Afraid, Rape, and Kick questionnaire    Fear of Current or Ex-Partner: No    Emotionally Abused: No    Physically Abused: No    Sexually Abused: No    Review of Systems  Constitutional: Negative.   HENT: Negative.    Eyes: Negative.   Respiratory: Negative.    Cardiovascular: Negative.   Gastrointestinal:  Positive for heartburn.  Genitourinary:  Positive for frequency.  Musculoskeletal: Negative.   Skin: Negative.   Neurological: Negative.   Endo/Heme/Allergies:  Positive for polydipsia.  Psychiatric/Behavioral: Negative.    All other systems reviewed and are negative.       Objective    BP 119/78   Pulse 88   Ht 5\' 11"  (1.803 m)   Wt 182 lb 3.2 oz (82.6 kg)   SpO2 97%   BMI 25.41 kg/m   Physical Exam Vitals and nursing note reviewed.  Constitutional:      Appearance: Normal appearance. She is overweight.  HENT:     Head: Normocephalic and atraumatic. Hair is abnormal.     Comments: Thinning hair    Right Ear: Tympanic membrane, ear canal and external ear normal.     Left Ear: Tympanic membrane, ear canal and external ear normal.     Nose: Nose normal.     Mouth/Throat:     Mouth: Mucous membranes are moist.     Pharynx: Oropharynx is clear.  Eyes:     Extraocular Movements: Extraocular movements intact.     Conjunctiva/sclera: Conjunctivae normal.  Pupils: Pupils are equal, round, and reactive to light.  Cardiovascular:      Rate and Rhythm: Normal rate and regular rhythm.     Pulses: Normal pulses.     Heart sounds: Normal heart sounds.  Pulmonary:     Effort: Pulmonary effort is normal.     Breath sounds: Normal breath sounds.  Abdominal:     General: Bowel sounds are normal.     Palpations: Abdomen is soft.  Musculoskeletal:        General: Normal range of motion.     Cervical back: Normal range of motion and neck supple.  Skin:    General: Skin is warm and dry.     Capillary Refill: Capillary refill takes less than 2 seconds.  Neurological:     General: No focal deficit present.     Mental Status: She is alert and oriented to person, place, and time. Mental status is at baseline.  Psychiatric:        Mood and Affect: Mood normal.        Behavior: Behavior normal.        Thought Content: Thought content normal.        Judgment: Judgment normal.         Assessment & Plan:   Problem List Items Addressed This Visit     Hypertension associated with diabetes (HCC)   Well controlled. She would like to DC hydrochlorothiazide due to dry mouth and polyuria. Continue Valsartan 320mg  daily. Recommend heart healthy diet such as Mediterranean diet with whole grains, fruits, vegetable, fish, lean meats, nuts, and olive oil. Limit salt. Encouraged moderate walking, 3-5 times/week for 30-50 minutes each session. Aim for at least 150 minutes.week. Goal should be pace of 3 miles/hours, or walking 1.5 miles in 30 minutes. Avoid tobacco products. Avoid excess alcohol. Take medications as prescribed and bring medications and blood pressure log with cuff to each office visit. Seek medical care for chest pain, palpitations, shortness of breath with exertion, dizziness/lightheadedness, vision changes, recurrent headaches, or swelling of extremities. Montior BP at home BID and return to office in 1 month or sooner if sustains >140/90.      Relevant Medications   valsartan (DIOVAN) 320 MG tablet   Other Relevant  Orders   CBC with Differential/Platelet   Comprehensive metabolic panel with GFR   Lipid panel   Hemoglobin A1c   TSH   VITAMIN D 25 Hydroxy (Vit-D Deficiency, Fractures)   Hyperlipidemia associated with type 2 diabetes mellitus (HCC)   Continue Atorvastatin. Your labs showed elevated cholesterol. I recommend consuming a heart healthy diet such as Mediterranean diet or DASH diet with whole grains, fruits, vegetable, fish, lean meats, nuts, and olive oil. Limit sweets and processed foods. I also encourage moderate intensity exercise 150 minutes weekly. This is 3-5 times weekly for 30-50 minutes each session. Goal should be pace of 3 miles/hours, or walking 1.5 miles in 30 minutes. Labs today.  The 10-year ASCVD risk score (Arnett DK, et al., 2019) is: 11.4%       Relevant Medications   valsartan (DIOVAN) 320 MG tablet   Other Relevant Orders   CBC with Differential/Platelet   Comprehensive metabolic panel with GFR   Lipid panel   Hemoglobin A1c   TSH   VITAMIN D 25 Hydroxy (Vit-D Deficiency, Fractures)   Pulmonary nodule   Former smoker, needs follow up CT      Relevant Orders   Ambulatory Referral for Lung Cancer Scre  Type 2 diabetes mellitus with hyperglycemia, without long-term current use of insulin (HCC)   A1c today. Has started Trulicity since A1c 11.2. Continue Trulicity, Amaryl, Metformin. A1c and uACR UTD. Foot exam utd. Vaccines today. Retinal eye exam ordered. Recommend heart healthy diet such as Mediterranean diet with whole grains, fruits, vegetable, fish, lean meats, nuts, and olive oil. Limit salt. Encouraged moderate walking, 3-5 times/week for 30-50 minutes each session. Aim for at least 150 minutes.week. Goal should be pace of 3 miles/hours, or walking 1.5 miles in 30 minutes. Seek medical care for urinary frequency, extreme thirst, vision changes, lightheadedness, dizziness.        Relevant Medications   valsartan (DIOVAN) 320 MG tablet   glucose blood  (TRUETRACK TEST) test strip   Other Relevant Orders   CBC with Differential/Platelet   Comprehensive metabolic panel with GFR   Lipid panel   Hemoglobin A1c   TSH   VITAMIN D 25 Hydroxy (Vit-D Deficiency, Fractures)   DG Bone Density   Gastroesophageal reflux disease without esophagitis   Well controlled on protonix. Anti-reflux measures such as raising the head of the bed, avoiding tight clothing or belts, avoiding eating late at night and not lying down shortly after mealtime and achieving weight loss are discussed. Avoid ASA, NSAID's, caffeine, peppermints, alcohol and tobacco. Alert me if there are persistent symptoms, dysphagia, weight loss or GI bleeding.       Relevant Orders   CBC with Differential/Platelet   Comprehensive metabolic panel with GFR   Lipid panel   Hemoglobin A1c   TSH   VITAMIN D 25 Hydroxy (Vit-D Deficiency, Fractures)   Magnesium   Abnormal ECG   EKG showed sinus with AV dissociation and accelerated junctional rhythm. Septal infarct. Referral back to cardiology.      Relevant Orders   EKG 12-Lead   Ambulatory referral to Cardiology   Physical exam, annual - Primary   Today your medical history was reviewed and routine physical exam with labs was performed. Recommend 150 minutes of moderate intensity exercise weekly and consuming a well-balanced diet. Advised to stop smoking if a smoker, avoid smoking if a non-smoker, limit alcohol consumption to 1 drink per day for women and 2 drinks per day for men, and avoid illicit drug use. Counseled on safe sex practices and offered STI testing today. Counseled on the importance of sunscreen use. Counseled in mental health awareness and when to seek medical care. Vaccine maintenance discussed. Appropriate health maintenance items reviewed. Return to office in 1 year for annual physical exam.       Other Visit Diagnoses       Diabetes mellitus without complication (HCC)       Relevant Medications   valsartan  (DIOVAN) 320 MG tablet   glucose blood (TRUETRACK TEST) test strip   Other Relevant Orders   Ambulatory referral to Ophthalmology     Encounter for osteoporosis screening in asymptomatic postmenopausal patient       Relevant Orders   DG Bone Density     Medication management       Relevant Orders   Magnesium       Return in about 2 weeks (around 03/21/2024) for hypertension, ASAP AWV.   Park Meo, FNP

## 2024-03-07 NOTE — Assessment & Plan Note (Signed)
 A1c today. Has started Trulicity since A1c 11.2. Continue Trulicity, Amaryl, Metformin. A1c and uACR UTD. Foot exam utd. Vaccines today. Retinal eye exam ordered. Recommend heart healthy diet such as Mediterranean diet with whole grains, fruits, vegetable, fish, lean meats, nuts, and olive oil. Limit salt. Encouraged moderate walking, 3-5 times/week for 30-50 minutes each session. Aim for at least 150 minutes.week. Goal should be pace of 3 miles/hours, or walking 1.5 miles in 30 minutes. Seek medical care for urinary frequency, extreme thirst, vision changes, lightheadedness, dizziness.

## 2024-03-07 NOTE — Assessment & Plan Note (Signed)

## 2024-03-07 NOTE — Assessment & Plan Note (Signed)
 Well controlled on protonix. Anti-reflux measures such as raising the head of the bed, avoiding tight clothing or belts, avoiding eating late at night and not lying down shortly after mealtime and achieving weight loss are discussed. Avoid ASA, NSAID's, caffeine, peppermints, alcohol and tobacco. Alert me if there are persistent symptoms, dysphagia, weight loss or GI bleeding.

## 2024-03-07 NOTE — Assessment & Plan Note (Signed)
 Continue Atorvastatin. Your labs showed elevated cholesterol. I recommend consuming a heart healthy diet such as Mediterranean diet or DASH diet with whole grains, fruits, vegetable, fish, lean meats, nuts, and olive oil. Limit sweets and processed foods. I also encourage moderate intensity exercise 150 minutes weekly. This is 3-5 times weekly for 30-50 minutes each session. Goal should be pace of 3 miles/hours, or walking 1.5 miles in 30 minutes. Labs today.  The 10-year ASCVD risk score (Arnett DK, et al., 2019) is: 11.4%

## 2024-03-07 NOTE — Patient Instructions (Signed)
 It was great to meet you today and I'm excited to have you join the [facility] practice. I hope you had a positive experience today! If you feel so inclined, please feel free to recommend our practice to friends and family. Kurtis Bushman, FNP-C

## 2024-03-07 NOTE — Assessment & Plan Note (Addendum)
 Former smoker, needs follow up CT

## 2024-03-07 NOTE — Assessment & Plan Note (Signed)
 Well controlled. She would like to DC hydrochlorothiazide due to dry mouth and polyuria. Continue Valsartan 320mg  daily. Recommend heart healthy diet such as Mediterranean diet with whole grains, fruits, vegetable, fish, lean meats, nuts, and olive oil. Limit salt. Encouraged moderate walking, 3-5 times/week for 30-50 minutes each session. Aim for at least 150 minutes.week. Goal should be pace of 3 miles/hours, or walking 1.5 miles in 30 minutes. Avoid tobacco products. Avoid excess alcohol. Take medications as prescribed and bring medications and blood pressure log with cuff to each office visit. Seek medical care for chest pain, palpitations, shortness of breath with exertion, dizziness/lightheadedness, vision changes, recurrent headaches, or swelling of extremities. Montior BP at home BID and return to office in 1 month or sooner if sustains >140/90.

## 2024-03-07 NOTE — Assessment & Plan Note (Signed)
 EKG showed sinus with AV dissociation and accelerated junctional rhythm. Septal infarct. Referral back to cardiology.

## 2024-03-08 ENCOUNTER — Other Ambulatory Visit: Payer: Self-pay | Admitting: Family Medicine

## 2024-03-08 LAB — COMPREHENSIVE METABOLIC PANEL WITH GFR
AG Ratio: 1.2 (calc) (ref 1.0–2.5)
ALT: 14 U/L (ref 6–29)
AST: 21 U/L (ref 10–35)
Albumin: 4.1 g/dL (ref 3.6–5.1)
Alkaline phosphatase (APISO): 90 U/L (ref 37–153)
BUN/Creatinine Ratio: 21 (calc) (ref 6–22)
BUN: 33 mg/dL — ABNORMAL HIGH (ref 7–25)
CO2: 23 mmol/L (ref 20–32)
Calcium: 9.5 mg/dL (ref 8.6–10.4)
Chloride: 99 mmol/L (ref 98–110)
Creat: 1.56 mg/dL — ABNORMAL HIGH (ref 0.50–1.05)
Globulin: 3.5 g/dL (ref 1.9–3.7)
Glucose, Bld: 375 mg/dL — ABNORMAL HIGH (ref 65–99)
Potassium: 4.4 mmol/L (ref 3.5–5.3)
Sodium: 134 mmol/L — ABNORMAL LOW (ref 135–146)
Total Bilirubin: 0.5 mg/dL (ref 0.2–1.2)
Total Protein: 7.6 g/dL (ref 6.1–8.1)
eGFR: 37 mL/min/{1.73_m2} — ABNORMAL LOW (ref >=60)

## 2024-03-08 LAB — CBC WITH DIFFERENTIAL/PLATELET
Absolute Lymphocytes: 2293 {cells}/uL (ref 850–3900)
Absolute Monocytes: 473 {cells}/uL (ref 200–950)
Basophils Absolute: 36 {cells}/uL (ref 0–200)
Basophils Relative: 0.4 %
Eosinophils Absolute: 237 {cells}/uL (ref 15–500)
Eosinophils Relative: 2.6 %
HCT: 36.6 % (ref 35.0–45.0)
Hemoglobin: 11.8 g/dL (ref 11.7–15.5)
MCH: 27.9 pg (ref 27.0–33.0)
MCHC: 32.2 g/dL (ref 32.0–36.0)
MCV: 86.5 fL (ref 80.0–100.0)
MPV: 11.9 fL (ref 7.5–12.5)
Monocytes Relative: 5.2 %
Neutro Abs: 6061 {cells}/uL (ref 1500–7800)
Neutrophils Relative %: 66.6 %
Platelets: 283 10*3/uL (ref 140–400)
RBC: 4.23 10*6/uL (ref 3.80–5.10)
RDW: 14.7 % (ref 11.0–15.0)
Total Lymphocyte: 25.2 %
WBC: 9.1 10*3/uL (ref 3.8–10.8)

## 2024-03-08 LAB — HEMOGLOBIN A1C
Hgb A1c MFr Bld: 11 %{Hb} — ABNORMAL HIGH (ref <5.7)
Mean Plasma Glucose: 269 mg/dL
eAG (mmol/L): 14.9 mmol/L

## 2024-03-08 LAB — VITAMIN D 25 HYDROXY (VIT D DEFICIENCY, FRACTURES): Vit D, 25-Hydroxy: 43 ng/mL (ref 30–100)

## 2024-03-08 LAB — LIPID PANEL
Cholesterol: 144 mg/dL (ref <200)
HDL: 38 mg/dL — ABNORMAL LOW (ref >=50)
LDL Cholesterol (Calc): 76 mg/dL
Non-HDL Cholesterol (Calc): 106 mg/dL (ref <130)
Total CHOL/HDL Ratio: 3.8 (calc) (ref <5.0)
Triglycerides: 201 mg/dL — ABNORMAL HIGH (ref <150)

## 2024-03-08 LAB — TSH: TSH: 1.35 m[IU]/L (ref 0.40–4.50)

## 2024-03-08 LAB — MAGNESIUM: Magnesium: 1.4 mg/dL — ABNORMAL LOW (ref 1.5–2.5)
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
## 2024-03-15 ENCOUNTER — Ambulatory Visit (INDEPENDENT_AMBULATORY_CARE_PROVIDER_SITE_OTHER): Payer: Medicaid Other | Admitting: Psychiatry

## 2024-03-15 ENCOUNTER — Encounter (HOSPITAL_COMMUNITY): Payer: Self-pay | Admitting: Psychiatry

## 2024-03-15 VITALS — BP 134/72 | HR 84 | Wt 179.4 lb

## 2024-03-15 DIAGNOSIS — F411 Generalized anxiety disorder: Secondary | ICD-10-CM

## 2024-03-15 MED ORDER — SERTRALINE HCL 25 MG PO TABS
12.5000 mg | ORAL_TABLET | Freq: Every day | ORAL | 2 refills | Status: DC
Start: 2024-03-15 — End: 2024-03-21

## 2024-03-15 NOTE — Patient Instructions (Addendum)
 Start taking zoloft 12.5mg  for generalized anxiety

## 2024-03-21 ENCOUNTER — Encounter: Payer: Self-pay | Admitting: Family Medicine

## 2024-03-21 ENCOUNTER — Ambulatory Visit: Admitting: Family Medicine

## 2024-03-21 VITALS — BP 116/68 | HR 96 | Temp 98.2°F | Ht 71.0 in | Wt 176.0 lb

## 2024-03-21 DIAGNOSIS — Z794 Long term (current) use of insulin: Secondary | ICD-10-CM

## 2024-03-21 DIAGNOSIS — I152 Hypertension secondary to endocrine disorders: Secondary | ICD-10-CM

## 2024-03-21 DIAGNOSIS — E1159 Type 2 diabetes mellitus with other circulatory complications: Secondary | ICD-10-CM | POA: Diagnosis not present

## 2024-03-21 DIAGNOSIS — Z0001 Encounter for general adult medical examination with abnormal findings: Secondary | ICD-10-CM

## 2024-03-21 DIAGNOSIS — Z7985 Long-term (current) use of injectable non-insulin antidiabetic drugs: Secondary | ICD-10-CM | POA: Diagnosis not present

## 2024-03-21 DIAGNOSIS — Z0000 Encounter for general adult medical examination without abnormal findings: Secondary | ICD-10-CM

## 2024-03-21 DIAGNOSIS — E119 Type 2 diabetes mellitus without complications: Secondary | ICD-10-CM

## 2024-03-21 MED ORDER — INSULIN DEGLUDEC 100 UNIT/ML ~~LOC~~ SOPN: 10.0000 [IU] | PEN_INJECTOR | Freq: Every day | SUBCUTANEOUS | 1 refills | Status: DC

## 2024-03-21 MED ORDER — PANTOPRAZOLE SODIUM 20 MG PO TBEC: 20.0000 mg | DELAYED_RELEASE_TABLET | Freq: Every day | ORAL | 1 refills | Status: DC

## 2024-03-21 NOTE — Assessment & Plan Note (Signed)
 Continue valsartan  320mg  daily. Monitor at home and record readings. Bring BP cuff to next OV. Recommend heart healthy diet such as Mediterranean diet with whole grains, fruits, vegetable, fish, lean meats, nuts, and olive oil. Limit salt. Encouraged moderate walking, 3-5 times/week for 30-50 minutes each session. Aim for at least 150 minutes.week. Goal should be pace of 3 miles/hours, or walking 1.5 miles in 30 minutes. Avoid tobacco products. Avoid excess alcohol. Take medications as prescribed and bring medications and blood pressure log with cuff to each office visit. Seek medical care for chest pain, palpitations, shortness of breath with exertion, dizziness/lightheadedness, vision changes, recurrent headaches, or swelling of extremities. Follow up next week for DM.

## 2024-03-21 NOTE — Progress Notes (Signed)
 Subjective:  HPI: Danielle Hurley is a 63 y.o. female presenting on 03/21/2024 for Medicare Wellness and Rash (Pt c/o 3 areas that are red and slightly itchy for last week. )   Rash   Patient is in today for AWV and BP follow up. Not monitoring BP at home, was 134/74 at psychiatry. She is taking Valsartan  320mg  daily, no side effects, denies chest pain, palpitations, recurrent headaches, vision changes, lightheadedness, dizziness, dyspnea on exertion, or swelling of extremities. She has not stopped taking Metformin  as instructed due to her creatinine nor increased trulicity  to 4.5mg  weekly. [name] admits confusion about what she should be eating, has not had formal diabetes education. Is not monitoring BG due to shortage of strips.   HYPERTENSION with Chronic Kidney Disease Hypertension status: better  Satisfied with current treatment? no Duration of hypertension: chronic BP monitoring frequency:  not checking BP range:  BP medication side effects:  no Medication compliance: excellent compliance Previous BP meds: valsartan  Aspirin: no Recurrent headaches: no Visual changes: no Palpitations: no Dyspnea: no Chest pain: no Lower extremity edema: no Dizzy/lightheaded: no  DIABETES Hypoglycemic episodes:no Polydipsia/polyuria: no Visual disturbance: no Chest pain: no Paresthesias: no Glucose Monitoring: no  Accucheck frequency: Not Checking  Fasting glucose:  Post prandial:  Evening:  Before meals: Taking Insulin ?: no  Long acting insulin :  Short acting insulin : Blood Pressure Monitoring: not checking Retinal Examination: Up to Date Foot Exam: Up to Date Diabetic Education: Not Completed Pneumovax: Up to Date Influenza: Up to Date Aspirin: no   Review of Systems  Skin:  Positive for itching.  All other systems reviewed and are negative.   Relevant past medical history reviewed and updated as indicated.   Past Medical History:  Diagnosis Date   Arthritis     COPD (chronic obstructive pulmonary disease) (HCC)    Diabetes mellitus without complication (HCC)    Dyspnea    GERD (gastroesophageal reflux disease)    Hepatitis C    treated 2022   High cholesterol    History of kidney stones    Hypertension    Tuberculosis    non contagious Dx 05/2020     Past Surgical History:  Procedure Laterality Date   CHOLECYSTECTOMY  1998   COLONOSCOPY  03/22/2023   first colonoscopy   TUBAL LIGATION  01/1995   VIDEO BRONCHOSCOPY WITH ENDOBRONCHIAL NAVIGATION N/A 09/04/2021   Procedure: VIDEO BRONCHOSCOPY WITH ENDOBRONCHIAL NAVIGATION;  Surgeon: Zelphia Higashi, MD;  Location: [facility] OR;  Service: Thoracic;  Laterality: N/A;    Allergies and medications reviewed and updated.   Current Outpatient Medications:    Accu-Chek Softclix Lancets lancets, USE 1  TO CHECK GLUCOSE IN THE MORNING AND 1 AT NOON AND 1 AT BEDTIME, Disp: 100 each, Rfl: 0   aspirin-acetaminophen-caffeine (EXCEDRIN MIGRAINE) 250-250-65 MG tablet, Take 1 tablet by mouth every 6 (six) hours as needed for headache., Disp: , Rfl:    atorvastatin  (LIPITOR) 20 MG tablet, Take 1 tablet (20 mg total) by mouth daily., Disp: 90 tablet, Rfl: 0   Bismuth Subsalicylate (KAOPECTATE) 262 MG TABS, Take 262 mg by mouth daily as needed (upset stomach)., Disp: , Rfl:    blood glucose meter kit and supplies, Dispense based on patient and insurance preference. Use up to four times daily as directed. (FOR ICD-10 E10.9, E11.9)., Disp: 1 each, Rfl: 0   Blood Glucose Monitoring Suppl DEVI, 1 each by Does not apply route in the morning, at noon, and at bedtime. May substitute  to any manufacturer covered by patient's insurance., Disp: 1 each, Rfl: 0   cetirizine (ZYRTEC) 10 MG tablet, Take 10 mg by mouth daily as needed for allergies., Disp: , Rfl:    Cholecalciferol (VITAMIN D ) 50 MCG (2000 UT) tablet, Take 2,000 Units by mouth daily., Disp: , Rfl:    cyclobenzaprine  (FLEXERIL ) 5 MG tablet, Take 1 tablet (5 mg  total) by mouth 3 (three) times daily as needed. for muscle spams, Disp: 30 tablet, Rfl: 0   Dulaglutide  (TRULICITY ) 3 MG/0.5ML SOAJ, Inject 3 mg into the skin once a week., Disp: 2 mL, Rfl: 1   glimepiride  (AMARYL ) 4 MG tablet, Take 1 tablet (4 mg total) by mouth 2 (two) times daily., Disp: 180 tablet, Rfl: 1   glucose blood (TRUETRACK TEST) test strip, Bloodsugar testing QD and prn, Disp: 100 each, Rfl: 12   insulin  degludec (TRESIBA ) 100 UNIT/ML FlexTouch Pen, Inject 10 Units into the skin daily., Disp: 3 mL, Rfl: 1   metFORMIN  (GLUCOPHAGE -XR) 500 MG 24 hr tablet, Take 1 tablet (500 mg total) by mouth 2 (two) times daily with a meal., Disp: 180 tablet, Rfl: 1   oxybutynin  (DITROPAN ) 5 MG tablet, Take 1 tablet (5 mg total) by mouth daily., Disp: 90 tablet, Rfl: 1   pantoprazole  (PROTONIX ) 20 MG tablet, Take 1 tablet (20 mg total) by mouth daily., Disp: 90 tablet, Rfl: 1   valsartan  (DIOVAN ) 320 MG tablet, Take 1 tablet (320 mg total) by mouth daily., Disp: 90 tablet, Rfl: 3  No Known Allergies  Objective:   BP 116/68   Pulse 96   Temp 98.2 F (36.8 C)   Ht 5\' 11"  (1.803 m)   Wt 176 lb (79.8 kg)   SpO2 97%   BMI 24.55 kg/m      03/21/2024    3:38 PM 03/15/2024    2:52 PM 03/07/2024    3:12 PM  Vitals with BMI  Height 5\' 11"   5\' 11"   Weight 176 lbs  182 lbs 3 oz  BMI 24.56  25.42  Systolic 116  119  Diastolic 68  78  Pulse 96  88     Information is confidential and restricted. Go to Review Flowsheets to unlock data.     Physical Exam Vitals and nursing note reviewed.  Constitutional:      Appearance: Normal appearance. She is normal weight.  HENT:     Head: Normocephalic and atraumatic.  Cardiovascular:     Rate and Rhythm: Normal rate and regular rhythm.     Pulses: Normal pulses.     Heart sounds: Normal heart sounds.  Pulmonary:     Effort: Pulmonary effort is normal.     Breath sounds: Normal breath sounds.  Skin:    General: Skin is warm and dry.  Neurological:      General: No focal deficit present.     Mental Status: She is alert and oriented to person, place, and time. Mental status is at baseline.  Psychiatric:        Mood and Affect: Mood normal.        Behavior: Behavior normal.        Thought Content: Thought content normal.        Judgment: Judgment normal.     Assessment & Plan:  Encounter for Medicare annual wellness exam  Diabetes mellitus treated with injections of non-insulin  medication (HCC) Assessment & Plan: A1c 11.2. Will hold Metformin  due to creatinine. She just filled her Truclicity and would like  to stay at 3mg  weekly. Start Insulin  Degludec 10 units daily. Increase 1 units daily if fasting BG >180. Monitor BID and bring readings to OV. Follow up in 1 week. Referral to diabetes education and pharmacy placed.   Orders: -     Amb Referral to Nutrition and Diabetic Education -     AMB Referral [facility] Care Management  Diabetes mellitus type 2, insulin  dependent (HCC) -     Amb Referral to Nutrition and Diabetic Education -     AMB Referral [facility] Care Management  Hypertension associated with diabetes (HCC) Assessment & Plan: Continue valsartan  320mg  daily. Monitor at home and record readings. Bring BP cuff to next OV. Recommend heart healthy diet such as Mediterranean diet with whole grains, fruits, vegetable, fish, lean meats, nuts, and olive oil. Limit salt. Encouraged moderate walking, 3-5 times/week for 30-50 minutes each session. Aim for at least 150 minutes.week. Goal should be pace of 3 miles/hours, or walking 1.5 miles in 30 minutes. Avoid tobacco products. Avoid excess alcohol. Take medications as prescribed and bring medications and blood pressure log with cuff to each office visit. Seek medical care for chest pain, palpitations, shortness of breath with exertion, dizziness/lightheadedness, vision changes, recurrent headaches, or swelling of extremities. Follow up next week for DM.    Other orders -     Insulin   Degludec; Inject 10 Units into the skin daily.  Dispense: 3 mL; Refill: 1 -     Pantoprazole  Sodium; Take 1 tablet (20 mg total) by mouth daily.  Dispense: 90 tablet; Refill: 1     Follow up plan: Return in 1 week (on 03/28/2024) for diabetes.  Jenelle Mis, FNP

## 2024-03-21 NOTE — Progress Notes (Signed)
 Subjective:   Danielle Hurley is a 63 y.o. female who presents for Medicare Annual (Subsequent) preventive examination.  Visit Complete: In person  Patient Medicare AWV questionnaire was completed by the patient on 03/21/24; I have confirmed that all information answered by patient is correct and no changes since this date.        Objective:    There were no vitals filed for this visit. There is no height or weight on file to calculate BMI.     09/02/2021    8:13 AM 08/18/2021    8:23 AM  Advanced Directives  Does Patient Have a Medical Advance Directive? No No  Would patient like information on creating a medical advance directive?  No - Patient declined    Current Medications (verified) Outpatient Encounter Medications as of 03/21/2024  Medication Sig   Accu-Chek Softclix Lancets lancets USE 1  TO CHECK GLUCOSE IN THE MORNING AND 1 AT NOON AND 1 AT BEDTIME   aspirin-acetaminophen-caffeine (EXCEDRIN MIGRAINE) 250-250-65 MG tablet Take 1 tablet by mouth every 6 (six) hours as needed for headache.   atorvastatin  (LIPITOR) 20 MG tablet Take 1 tablet (20 mg total) by mouth daily.   Bismuth Subsalicylate (KAOPECTATE) 262 MG TABS Take 262 mg by mouth daily as needed (upset stomach).   blood glucose meter kit and supplies Dispense based on patient and insurance preference. Use up to four times daily as directed. (FOR ICD-10 E10.9, E11.9).   Blood Glucose Monitoring Suppl DEVI 1 each by Does not apply route in the morning, at noon, and at bedtime. May substitute to any manufacturer covered by patient's insurance.   cetirizine (ZYRTEC) 10 MG tablet Take 10 mg by mouth daily as needed for allergies.   Cholecalciferol (VITAMIN D ) 50 MCG (2000 UT) tablet Take 2,000 Units by mouth daily.   cyclobenzaprine  (FLEXERIL ) 5 MG tablet Take 1 tablet (5 mg total) by mouth 3 (three) times daily as needed. for muscle spams   Dulaglutide  (TRULICITY ) 3 MG/0.5ML SOAJ Inject 3 mg into the skin once a week.    glimepiride  (AMARYL ) 4 MG tablet Take 1 tablet (4 mg total) by mouth 2 (two) times daily.   glucose blood (TRUETRACK TEST) test strip Bloodsugar testing QD and prn   metFORMIN  (GLUCOPHAGE -XR) 500 MG 24 hr tablet Take 1 tablet (500 mg total) by mouth 2 (two) times daily with a meal.   oxybutynin  (DITROPAN ) 5 MG tablet Take 1 tablet (5 mg total) by mouth daily. (Patient not taking: Reported on 03/15/2024)   pantoprazole  (PROTONIX ) 40 MG tablet Take 1 tablet (40 mg total) by mouth daily.   sertraline  (ZOLOFT ) 25 MG tablet Take 0.5 tablets (12.5 mg total) by mouth daily.   valsartan  (DIOVAN ) 320 MG tablet Take 1 tablet (320 mg total) by mouth daily.   No facility-administered encounter medications on file as of 03/21/2024.    Allergies (verified) Patient has no known allergies.   History: Past Medical History:  Diagnosis Date   Arthritis    COPD (chronic obstructive pulmonary disease) (HCC)    Diabetes mellitus without complication (HCC)    Dyspnea    GERD (gastroesophageal reflux disease)    Hepatitis C    treated 2022   High cholesterol    History of kidney stones    Hypertension    Tuberculosis    non contagious Dx 05/2020   Past Surgical History:  Procedure Laterality Date   CHOLECYSTECTOMY  1998   COLONOSCOPY  03/22/2023   first colonoscopy  TUBAL LIGATION  01/1995   VIDEO BRONCHOSCOPY WITH ENDOBRONCHIAL NAVIGATION N/A 09/04/2021   Procedure: VIDEO BRONCHOSCOPY WITH ENDOBRONCHIAL NAVIGATION;  Surgeon: Zelphia Higashi, MD;  Location: [facility] OR;  Service: Thoracic;  Laterality: N/A;   Family History  Adopted: Yes  Problem Relation Age of Onset   Lung cancer Brother    Colon cancer Neg Hx    Stomach cancer Neg Hx    Esophageal cancer Neg Hx    Colon polyps Neg Hx    Social History   Socioeconomic History   Marital status: Widowed    Spouse name: Not on file   Number of children: 4   Years of education: Not on file   Highest education level: 12th grade   Occupational History   Not on file  Tobacco Use   Smoking status: Former    Current packs/day: 0.00    Average packs/day: 1 pack/day for 40.0 years (40.0 ttl pk-yrs)    Types: Cigarettes    Start date: 98    Quit date: 2014    Years since quitting: 11.3    Passive exposure: Past   Smokeless tobacco: Never  Vaping Use   Vaping status: Never Used  Substance and Sexual Activity   Alcohol use: Not Currently    Comment: none since hep c dx   Drug use: Not Currently    Types: Marijuana, Cocaine, Methamphetamines, IV   Sexual activity: Not Currently  Other Topics Concern   Not on file  Social History Narrative   Not on file   Social Drivers of Health   Financial Resource Strain: Low Risk  (03/03/2024)   Overall Financial Resource Strain (CARDIA)    Difficulty of Paying Living Expenses: Not hard at all  Food Insecurity: No Food Insecurity (03/03/2024)   Hunger Vital Sign    Worried About Running Out of Food in the Last Year: Never true    Ran Out of Food in the Last Year: Never true  Transportation Needs: No Transportation Needs (03/03/2024)   PRAPARE - Administrator, Civil Service (Medical): No    Lack of Transportation (Non-Medical): No  Physical Activity: Insufficiently Active (03/03/2024)   Exercise Vital Sign    Days of Exercise per Week: 5 days    Minutes of Exercise per Session: 20 min  Stress: Stress Concern Present (03/03/2024)   [employer] of Occupational Health - Occupational Stress Questionnaire    Feeling of Stress : To some extent  Social Connections: Socially Isolated (03/03/2024)   Social Connection and Isolation Panel [NHANES]    Frequency of Communication with Friends and Family: Once a week    Frequency of Social Gatherings with Friends and Family: More than three times a week    Attends Religious Services: Never    Database administrator or Organizations: No    Attends Banker Meetings: Never    Marital Status: Widowed     Tobacco Counseling Counseling given: Not Answered   Clinical Intake:                        Activities of Daily Living     No data to display          Patient Care Team: Jenelle Mis, FNP as PCP - General (Family Medicine)  Indicate any recent Medical Services you may have received from other than [facility] providers in the past year (date may be approximate).     Assessment:  This is a routine wellness examination for Tanashia.  Hearing/Vision screen No results found.   Goals Addressed   None    Depression Screen    03/07/2024    3:18 PM 11/10/2023    2:08 PM 10/05/2023   10:18 AM 08/11/2023    1:21 PM 07/20/2023   11:07 AM 06/02/2023    2:16 PM 04/30/2023   11:24 AM  PHQ 2/9 Scores  PHQ - 2 Score 3 4 4 3   4   PHQ- 9 Score 12 13 14 15   14      Information is confidential and restricted. Go to Review Flowsheets to unlock data.    Fall Risk    03/07/2024    3:18 PM 08/11/2023    1:21 PM 03/04/2023    2:33 PM 05/19/2022    8:39 AM 11/26/2021    2:57 PM  Fall Risk   Falls in the past year? 1 1 1  0 0  Number falls in past yr: 0 1 1 0 0  Injury with Fall? 1 1 1  0 0  Risk for fall due to : Impaired balance/gait History of fall(s)     Follow up Follow up appointment Falls evaluation completed Falls evaluation completed  Falls evaluation completed    MEDICARE RISK AT HOME:    TIMED UP AND GO:  Was the test performed?  Yes  Length of time to ambulate 10 feet: 6  sec Gait steady and fast without use of assistive device    Cognitive Function:        Immunizations Immunization History  Administered Date(s) Administered   Hepb-cpg 05/13/2021, 07/22/2021   Influenza, Seasonal, Injecte, Preservative Fre 08/11/2023   Influenza,inj,Quad PF,6+ Mos 09/10/2021, 11/03/2022   PNEUMOCOCCAL CONJUGATE-20 03/07/2024   Pneumococcal Polysaccharide-23 09/19/2021   Tdap 03/07/2024   Zoster Recombinant(Shingrix) 11/26/2021, 02/24/2022    TDAP status: Up to  date  Flu Vaccine status: Up to date  Pneumococcal vaccine status: Up to date  Covid-19 vaccine status: Completed vaccines  Qualifies for Shingles Vaccine? Yes   Zostavax completed Yes   Shingrix Completed?: Yes  Screening Tests Health Maintenance  Topic Date Due   Medicare Annual Wellness (AWV)  Never done   OPHTHALMOLOGY EXAM  Never done   Lung Cancer Screening  08/05/2022   DEXA SCAN  Never done   COVID-19 Vaccine (1) 03/23/2024 (Originally 02/05/1966)   FOOT EXAM  04/13/2024   INFLUENZA VACCINE  06/30/2024   HEMOGLOBIN A1C  09/06/2024   Diabetic kidney evaluation - Urine ACR  11/09/2024   Diabetic kidney evaluation - eGFR measurement  03/07/2025   MAMMOGRAM  08/31/2025   Cervical Cancer Screening (HPV/Pap Cotest)  11/26/2026   Colonoscopy  03/21/2033   DTaP/Tdap/Td (2 - Td or Tdap) 03/07/2034   Pneumonia Vaccine 15+ Years old  Completed   Hepatitis C Screening  Completed   HIV Screening  Completed   Zoster Vaccines- Shingrix  Completed   HPV VACCINES  Aged Out   Meningococcal B Vaccine  Aged Out    Health Maintenance  Health Maintenance Due  Topic Date Due   Medicare Annual Wellness (AWV)  Never done   OPHTHALMOLOGY EXAM  Never done   Lung Cancer Screening  08/05/2022   DEXA SCAN  Never done    Colorectal cancer screening: Type of screening: Colonoscopy. Completed 03/22/2023. Repeat every 10 years  Mammogram status: Completed 09/01/2023. Repeat every year  Bone Density status: Ordered 02/12/2024. Pt provided with contact info and advised to call to  schedule appt.  Lung Cancer Screening: (Low Dose CT Chest recommended if Age 43-80 years, 20 pack-year currently smoking OR have quit w/in 15years.) does qualify.   Lung Cancer Screening Referral:  Additional Screening:  Hepatitis C Screening: does qualify; Completed 04/22/2023  Vision Screening: Recommended annual ophthalmology exams for early detection of glaucoma and other disorders of the eye. Is the patient  up to date with their annual eye exam?   scheduled for 11/08/24 Who is the provider or what is the name of the office in which the patient attends annual eye exams? Dr. Candi Chafe If pt is not established with a provider, would they like to be referred to a provider to establish care? No .   Dental Screening: Recommended annual dental exams for proper oral hygiene  Diabetic Foot Exam: Diabetic Foot Exam: Overdue, Pt has been advised about the importance in completing this exam. Pt is scheduled for diabetic foot exam on 03/21/24.  Community Resource Referral / Chronic Care Management: CRR required this visit?  No   CCM required this visit?  No     Plan:     I have personally reviewed and noted the following in the patient's chart:   Medical and social history Use of alcohol, tobacco or illicit drugs  Current medications and supplements including opioid prescriptions. Patient is not currently taking opioid prescriptions. Functional ability and status Nutritional status Physical activity Advanced directives List of other physicians Hospitalizations, surgeries, and ER visits in previous 12 months Vitals Screenings to include cognitive, depression, and falls Referrals and appointments  In addition, I have reviewed and discussed with patient certain preventive protocols, quality metrics, and best practice recommendations. A written personalized care plan for preventive services as well as general preventive health recommendations were provided to patient.     Verneda Golder, LPN   [id number]   After Visit Summary: (In Person-Printed) AVS printed and given to the patient  Nurse Notes:

## 2024-03-21 NOTE — Patient Instructions (Signed)
  Danielle Hurley , Thank you for taking time to come for your Medicare Wellness Visit. I appreciate your ongoing commitment to your health goals. Please review the following plan we discussed and let me know if I can assist you in the future.   These are the goals we discussed:  Goals      Exercise 3x per week (30 min per time)     Continue to exercise 3 days per week.         This is a list of the screening recommended for you and due dates:  Health Maintenance  Topic Date Due   Screening for Lung Cancer  08/05/2022   DEXA scan (bone density measurement)  Never done   COVID-19 Vaccine (1) 03/23/2024*   Eye exam for diabetics  11/13/2024*   Complete foot exam   04/13/2024   Flu Shot  06/30/2024   Hemoglobin A1C  09/06/2024   Yearly kidney health urinalysis for diabetes  11/09/2024   Yearly kidney function blood test for diabetes  03/07/2025   Medicare Annual Wellness Visit  03/21/2025   Mammogram  08/31/2025   Pap with HPV screening  11/26/2026   Colon Cancer Screening  03/21/2033   DTaP/Tdap/Td vaccine (2 - Td or Tdap) 03/07/2034   Pneumonia Vaccine  Completed   Hepatitis C Screening  Completed   HIV Screening  Completed   Zoster (Shingles) Vaccine  Completed   HPV Vaccine  Aged Out   Meningitis B Vaccine  Aged Out  *Topic was postponed. The date shown is not the original due date.

## 2024-03-21 NOTE — Assessment & Plan Note (Signed)
 A1c 11.2. Will hold Metformin  due to creatinine. She just filled her Truclicity and would like to stay at 3mg  weekly. Start Insulin  Degludec 10 units daily. Increase 1 units daily if fasting BG >180. Monitor BID and bring readings to OV. Follow up in 1 week. Referral to diabetes education and pharmacy placed.

## 2024-03-22 ENCOUNTER — Telehealth: Payer: Self-pay | Admitting: *Deleted

## 2024-03-22 ENCOUNTER — Telehealth: Payer: Self-pay

## 2024-03-22 ENCOUNTER — Other Ambulatory Visit: Payer: Self-pay | Admitting: Family Medicine

## 2024-03-22 MED ORDER — COMFORT TOUCH INSULIN PEN NEED 32G X 6 MM MISC: 1.0000 | Freq: Every day | 3 refills | Status: AC

## 2024-03-22 NOTE — Telephone Encounter (Signed)
 Copied from CRM [id number]. Topic: Clinical - Prescription Issue >> Mar 22, 2024  9:58 AM Georgeann Kindred wrote: Reason for CRM: Patient is requesting a prescription for needles to take her insulin  as she was prescribed yesterday 04/22 the medication. Please contact patient at [phone].

## 2024-03-22 NOTE — Progress Notes (Signed)
 Care Guide Pharmacy Note  03/22/2024 Name: Danielle Hurley MRN: [id number] DOB: 07-Sep-1961  Referred By: Jenelle Mis, FNP Reason for referral: No chief complaint on file.   Danielle Hurley is a 63 y.o. year old female who is a primary care patient of Jenelle Mis, FNP.  Danielle Hurley was referred to the pharmacist for assistance related to: DMII  Successful contact was made with the patient to discuss pharmacy services including being ready for the pharmacist to call at least 5 minutes before the scheduled appointment time and to have medication bottles and any blood pressure readings ready for review. The patient agreed to meet with the pharmacist via telephone visit on (date/time).03/23/24 at 1300  Barnie Bora  [facility] Guide  Direct Dial: [phone]  Fax [phone]

## 2024-03-23 ENCOUNTER — Other Ambulatory Visit: Payer: Self-pay

## 2024-03-23 DIAGNOSIS — E119 Type 2 diabetes mellitus without complications: Secondary | ICD-10-CM

## 2024-03-23 DIAGNOSIS — E1165 Type 2 diabetes mellitus with hyperglycemia: Secondary | ICD-10-CM

## 2024-03-23 MED ORDER — BLOOD GLUCOSE TEST VI STRP: 1.0000 | ORAL_STRIP | Freq: Every day | 0 refills | Status: AC

## 2024-03-23 MED ORDER — BLOOD GLUCOSE METER KIT: PACK | 0 refills | Status: DC

## 2024-03-23 NOTE — Patient Instructions (Addendum)
 Leary Provencal, I reviewed your insurance benefits and see that you are eligible for the following per [facility]: "$0 copay for 48 one-way trips to or from approved locations, such as medically related appointments, gyms and pharmacies" You will have to call your insurance [phone] to see how to specifically coordinate rides.

## 2024-03-23 NOTE — Progress Notes (Signed)
 03/23/2024  Patient ID: Danielle Hurley, female   DOB: [birth year], 63 y.o.   MRN: [id number]     03/23/2024 Name: Danielle Hurley MRN: [id number] DOB: 30-Dec-1960  No chief complaint on file.   Danielle Hurley is a 63 y.o. year old female who presented for a telephone visit.   They were referred to the pharmacist by their PCP for assistance in managing diabetes.    Subjective:  Care Team: Primary Care Provider: Jenelle Mis, FNP ; Next Scheduled Visit:  Future Appointments  Date Time Provider Department Center  03/28/2024  3:45 PM Jenelle Mis, FNP [facility] P[facility]  04/13/2024 11:00 AM Coni Deep, [facility] None  11/15/2024  3:00 PM GI[facility]G DX DEXA 1 [facility]      Medication Access/Adherence  Current Pharmacy:  [id number] [place], [place] - [address] AT [facility] [address] [place] [address] Phone: [phone] Fax: [phone]  [facility] [address] ([address]), [place] - [address] [place] ([place]) [place] [address] Phone: [phone] Fax: [phone]   Patient reports affordability concerns with their medications:  notes that insurance would not cover test strips  Patient reports access/transportation concerns to their pharmacy:  relies on family to get medications/for transportation. Patient reports adherence concerns with their medications:    Diabetes:  Current medications:  Medications tried in the past: metformin  - on hold d/t kidney function.  -glimepiride  4mg  bid -insulin  glargine 10 units daily -trulicity  3mg  sq once weekly  Patient denies hypoglycemic s/sx including dizziness, shakiness, sweating. Patient denies hyperglycemic symptoms including polyuria, polydipsia, polyphagia, nocturia, neuropathy, blurred vision.  Current meal patterns: foods mentioned but not necessarily in excess: pasta, cookies, salads, water, coffee, tea   Current physical activity:  occasionally walking her two dogs, has peddle machine   Current medication access support: dual [employer] plan   Objective:  Lab Results  Component Value Date   HGBA1C 11.0 (H) 03/07/2024    Lab Results  Component Value Date   CREATININE 1.56 (H) 03/07/2024   BUN 33 (H) 03/07/2024   NA 134 (L) 03/07/2024   K 4.4 03/07/2024   CL 99 03/07/2024   CO2 23 03/07/2024    Lab Results  Component Value Date   CHOL 144 03/07/2024   HDL 38 (L) 03/07/2024   LDLCALC 76 03/07/2024   TRIG 201 (H) 03/07/2024   CHOLHDL 3.8 03/07/2024    Medications Reviewed Today   Medications were not reviewed in this encounter      Assessment/Plan:   Diabetes: - Currently uncontrolled - Reviewed long term cardiovascular and renal outcomes of uncontrolled blood sugar - Reviewed goal A1c, goal fasting, and goal 2 hour post prandial glucose. Has not tested recently.  - Reviewed dietary modifications including low carb diet, not eating late - Reviewed lifestyle modifications including: walking up to 30 minutes per day - Recommend to check glucose at the very minimum daily, we discussed hopefully getting pt on CGM long term, now that she is on insulin .    Follow Up Plan: 1-2wk f/u telephone call. Patient to start insulin  injections - needing for dtr to pick up today. Reviewed insulin  instructions. I will check into strips as pharmacist told her they were not covered and she may need to get over the counter.   Rolando Cliche, PharmD, BCGP Clinical Pharmacist  [phone]    Future Appointments  Date Time Provider Department Center  03/28/2024  3:45 PM Jenelle Mis, FNP [facility] P[facility]  04/13/2024 11:00 AM Coni Deep, [facility] None  11/15/2024  3:00 PM GI-[facility]G DX DEXA 1 GI-[facility] CE

## 2024-03-28 ENCOUNTER — Encounter: Payer: Self-pay | Admitting: Family Medicine

## 2024-03-28 ENCOUNTER — Ambulatory Visit (INDEPENDENT_AMBULATORY_CARE_PROVIDER_SITE_OTHER): Admitting: Family Medicine

## 2024-03-28 VITALS — BP 136/82 | HR 82 | Ht 71.0 in | Wt 180.0 lb

## 2024-03-28 DIAGNOSIS — E785 Hyperlipidemia, unspecified: Secondary | ICD-10-CM

## 2024-03-28 DIAGNOSIS — E1159 Type 2 diabetes mellitus with other circulatory complications: Secondary | ICD-10-CM

## 2024-03-28 DIAGNOSIS — E1165 Type 2 diabetes mellitus with hyperglycemia: Secondary | ICD-10-CM

## 2024-03-28 DIAGNOSIS — I152 Hypertension secondary to endocrine disorders: Secondary | ICD-10-CM | POA: Diagnosis not present

## 2024-03-28 DIAGNOSIS — Z794 Long term (current) use of insulin: Secondary | ICD-10-CM | POA: Insufficient documentation

## 2024-03-28 DIAGNOSIS — S134XXA Sprain of ligaments of cervical spine, initial encounter: Secondary | ICD-10-CM | POA: Diagnosis not present

## 2024-03-28 DIAGNOSIS — M62838 Other muscle spasm: Secondary | ICD-10-CM

## 2024-03-28 DIAGNOSIS — E1169 Type 2 diabetes mellitus with other specified complication: Secondary | ICD-10-CM | POA: Diagnosis not present

## 2024-03-28 MED ORDER — CYCLOBENZAPRINE HCL 5 MG PO TABS: 5.0000 mg | ORAL_TABLET | Freq: Every day | ORAL | 0 refills | Status: DC | PRN

## 2024-03-28 NOTE — Assessment & Plan Note (Signed)
 Currently taking Valsartan , 136/82 in office today. Will monitor at home and return to office in 2 weeks with readings. Recommend heart healthy diet such as Mediterranean diet with whole grains, fruits, vegetable, fish, lean meats, nuts, and olive oil. Limit salt. Encouraged moderate walking, 3-5 times/week for 30-50 minutes each session. Aim for at least 150 minutes.week. Goal should be pace of 3 miles/hours, or walking 1.5 miles in 30 minutes. Avoid tobacco products. Avoid excess alcohol. Take medications as prescribed and bring medications and blood pressure log with cuff to each office visit. Seek medical care for chest pain, palpitations, shortness of breath with exertion, dizziness/lightheadedness, vision changes, recurrent headaches, or swelling of extremities.

## 2024-03-28 NOTE — Assessment & Plan Note (Addendum)
 Increase Atorvastatin  to 20mg  daily as currently taking 0.5 tablet. Your labs showed elevated cholesterol. I recommend consuming a heart healthy diet such as Mediterranean diet or DASH diet with whole grains, fruits, vegetable, fish, lean meats, nuts, and olive oil. Limit sweets and processed foods. I also encourage moderate intensity exercise 150 minutes weekly. This is 3-5 times weekly for 30-50 minutes each session. Goal should be pace of 3 miles/hours, or walking 1.5 miles in 30 minutes. The 10-year ASCVD risk score (Arnett DK, et al., 2019) is: 15.3% Recheck CMP, lipids in 6 weeks

## 2024-03-28 NOTE — Patient Instructions (Signed)
 Increase tresiba  1 units daily if fasting blood glucose is >130.

## 2024-03-28 NOTE — Assessment & Plan Note (Signed)
 Continue Trulicity  3mg  weekly and increase to 4.5mg  weekly at next visit. Continue Tresiba  11 units daily and increase 1 unit daily until FBG <130 then notify office and will begin post prandial readings.Recommend heart healthy diet such as Mediterranean diet with whole grains, fruits, vegetable, fish, lean meats, nuts, and olive oil. Limit salt. Encouraged moderate walking, 3-5 times/week for 30-50 minutes each session. Aim for at least 150 minutes.week. Goal should be pace of 3 miles/hours, or walking 1.5 miles in 30 minutes. Seek medical care for urinary frequency, extreme thirst, vision changes, lightheadedness, dizziness.  Follow up in 2 weeks

## 2024-03-28 NOTE — Progress Notes (Signed)
 Subjective:  HPI: Danielle Hurley is a 63 y.o. female presenting on 03/28/2024 for Hypertension   Hypertension   Patient is in today for HTN and DM follow up.  Has been taking Tresiba  since Saturday. Home FBG readings: 170, 158, 192, 178. PP readings in 300s. Is taking 11 units daily currently as instructed to increase 1 units daily for readings >130. Is taking 3mg  weekly Trulicity  until next refill and will increase to 4.5mg  weekly, has DC metformin , and is taking 20 mg daily Pantoprazole . Repeat CMP today Is taking Atorvastatin  10mg  daily. LDL >70 Home BP readings have been high, office readings are lower, teach back how to use her cuff today in office.   Denies chest pain, palpitations, recurrent headaches, vision changes, lightheadedness, dizziness, dyspnea on exertion, or swelling of extremities, polyuria, polydipsia.   Review of Systems  All other systems reviewed and are negative.   Relevant past medical history reviewed and updated as indicated.   Past Medical History:  Diagnosis Date   Arthritis    COPD (chronic obstructive pulmonary disease) (HCC)    Diabetes mellitus without complication (HCC)    Dyspnea    GERD (gastroesophageal reflux disease)    Hepatitis C    treated 2022   High cholesterol    History of kidney stones    Hypertension    Tuberculosis    non contagious Dx 05/2020     Past Surgical History:  Procedure Laterality Date   CHOLECYSTECTOMY  1998   COLONOSCOPY  03/22/2023   first colonoscopy   TUBAL LIGATION  01/1995   VIDEO BRONCHOSCOPY WITH ENDOBRONCHIAL NAVIGATION N/A 09/04/2021   Procedure: VIDEO BRONCHOSCOPY WITH ENDOBRONCHIAL NAVIGATION;  Surgeon: Zelphia Higashi, MD;  Location: [facility] OR;  Service: Thoracic;  Laterality: N/A;    Allergies and medications reviewed and updated.   Current Outpatient Medications:    Accu-Chek Softclix Lancets lancets, USE 1  TO CHECK GLUCOSE IN THE MORNING AND 1 AT NOON AND 1 AT BEDTIME, Disp: 100 each,  Rfl: 0   aspirin-acetaminophen-caffeine (EXCEDRIN MIGRAINE) 250-250-65 MG tablet, Take 1 tablet by mouth every 6 (six) hours as needed for headache., Disp: , Rfl:    atorvastatin  (LIPITOR) 20 MG tablet, Take 1 tablet (20 mg total) by mouth daily., Disp: 90 tablet, Rfl: 0   Bismuth Subsalicylate (KAOPECTATE) 262 MG TABS, Take 262 mg by mouth daily as needed (upset stomach)., Disp: , Rfl:    Blood Glucose Monitoring Suppl DEVI, 1 each by Does not apply route in the morning, at noon, and at bedtime. May substitute to any manufacturer covered by patient's insurance., Disp: 1 each, Rfl: 0   cetirizine (ZYRTEC) 10 MG tablet, Take 10 mg by mouth daily as needed for allergies., Disp: , Rfl:    Cholecalciferol (VITAMIN D ) 50 MCG (2000 UT) tablet, Take 2,000 Units by mouth daily., Disp: , Rfl:    Dulaglutide  (TRULICITY ) 3 MG/0.5ML SOAJ, Inject 3 mg into the skin once a week., Disp: 2 mL, Rfl: 1   glimepiride  (AMARYL ) 4 MG tablet, Take 1 tablet (4 mg total) by mouth 2 (two) times daily., Disp: 180 tablet, Rfl: 1   Glucose Blood (BLOOD GLUCOSE TEST STRIPS) STRP, 1 each by In Vitro route daily. May substitute to any manufacturer covered by patient's insurance., Disp: 100 each, Rfl: 0   glucose blood (TRUETRACK TEST) test strip, Bloodsugar testing QD and prn, Disp: 100 each, Rfl: 12   insulin  degludec (TRESIBA ) 100 UNIT/ML FlexTouch Pen, Inject 10 Units into the skin  daily., Disp: 3 mL, Rfl: 1   Insulin  Pen Needle (COMFORT TOUCH INSULIN  PEN NEED) 32G X 6 MM MISC, 1 Needle by Does not apply route daily., Disp: 100 each, Rfl: 3   metFORMIN  (GLUCOPHAGE -XR) 500 MG 24 hr tablet, Take 1 tablet (500 mg total) by mouth 2 (two) times daily with a meal., Disp: 180 tablet, Rfl: 1   oxybutynin  (DITROPAN ) 5 MG tablet, Take 1 tablet (5 mg total) by mouth daily., Disp: 90 tablet, Rfl: 1   pantoprazole  (PROTONIX ) 20 MG tablet, Take 1 tablet (20 mg total) by mouth daily., Disp: 90 tablet, Rfl: 1   valsartan  (DIOVAN ) 320 MG tablet,  Take 1 tablet (320 mg total) by mouth daily., Disp: 90 tablet, Rfl: 3   cyclobenzaprine  (FLEXERIL ) 5 MG tablet, Take 1 tablet (5 mg total) by mouth daily as needed for muscle spasms. for muscle spams, Disp: 10 tablet, Rfl: 0  No Known Allergies  Objective:   BP 136/82   Pulse 82   Ht 5\' 11"  (1.803 m)   Wt 180 lb (81.6 kg)   SpO2 97%   BMI 25.10 kg/m      03/28/2024    3:54 PM 03/21/2024    3:38 PM 03/15/2024    2:52 PM  Vitals with BMI  Height 5\' 11"  5\' 11"    Weight 180 lbs 176 lbs   BMI 25.12 24.56   Systolic 136 116   Diastolic 82 68   Pulse 82 96      Information is confidential and restricted. Go to Review Flowsheets to unlock data.     Physical Exam Vitals and nursing note reviewed.  Constitutional:      Appearance: Normal appearance. She is normal weight.  HENT:     Head: Normocephalic and atraumatic.  Cardiovascular:     Rate and Rhythm: Normal rate and regular rhythm.     Pulses: Normal pulses.     Heart sounds: Normal heart sounds.  Pulmonary:     Effort: Pulmonary effort is normal.     Breath sounds: Normal breath sounds.  Skin:    General: Skin is warm and dry.  Neurological:     General: No focal deficit present.     Mental Status: She is alert and oriented to person, place, and time. Mental status is at baseline.  Psychiatric:        Mood and Affect: Mood normal.        Behavior: Behavior normal.        Thought Content: Thought content normal.        Judgment: Judgment normal.     Assessment & Plan:  Type 2 diabetes mellitus with hyperglycemia, without long-term current use of insulin  (HCC) -     Comprehensive metabolic panel with GFR  Neck muscle spasm -     Cyclobenzaprine  HCl; Take 1 tablet (5 mg total) by mouth daily as needed for muscle spasms. for muscle spams  Dispense: 10 tablet; Refill: 0  Whiplash injury to neck, initial encounter -     Cyclobenzaprine  HCl; Take 1 tablet (5 mg total) by mouth daily as needed for muscle spasms. for  muscle spams  Dispense: 10 tablet; Refill: 0  Hypertension associated with diabetes (HCC) Assessment & Plan: Currently taking Valsartan , 136/82 in office today. Will monitor at home and return to office in 2 weeks with readings. Recommend heart healthy diet such as Mediterranean diet with whole grains, fruits, vegetable, fish, lean meats, nuts, and olive oil. Limit salt. Encouraged moderate  walking, 3-5 times/week for 30-50 minutes each session. Aim for at least 150 minutes.week. Goal should be pace of 3 miles/hours, or walking 1.5 miles in 30 minutes. Avoid tobacco products. Avoid excess alcohol. Take medications as prescribed and bring medications and blood pressure log with cuff to each office visit. Seek medical care for chest pain, palpitations, shortness of breath with exertion, dizziness/lightheadedness, vision changes, recurrent headaches, or swelling of extremities.   Orders: -     Comprehensive metabolic panel with GFR  Diabetes mellitus type 2, insulin  dependent (HCC) Assessment & Plan: Continue Trulicity  3mg  weekly and increase to 4.5mg  weekly at next visit. Continue Tresiba  11 units daily and increase 1 unit daily until FBG <130 then notify office and will begin post prandial readings.Recommend heart healthy diet such as Mediterranean diet with whole grains, fruits, vegetable, fish, lean meats, nuts, and olive oil. Limit salt. Encouraged moderate walking, 3-5 times/week for 30-50 minutes each session. Aim for at least 150 minutes.week. Goal should be pace of 3 miles/hours, or walking 1.5 miles in 30 minutes. Seek medical care for urinary frequency, extreme thirst, vision changes, lightheadedness, dizziness.  Follow up in 2 weeks   Hyperlipidemia associated with type 2 diabetes mellitus (HCC) Assessment & Plan: Increase Atorvastatin  to 20mg  daily as currently taking 0.5 tablet. Your labs showed elevated cholesterol. I recommend consuming a heart healthy diet such as Mediterranean diet  or DASH diet with whole grains, fruits, vegetable, fish, lean meats, nuts, and olive oil. Limit sweets and processed foods. I also encourage moderate intensity exercise 150 minutes weekly. This is 3-5 times weekly for 30-50 minutes each session. Goal should be pace of 3 miles/hours, or walking 1.5 miles in 30 minutes. The 10-year ASCVD risk score (Arnett DK, et al., 2019) is: 15.3% Recheck CMP, lipids in 6 weeks      Follow up plan: Return in about 2 weeks (around 04/11/2024) for hypertension, diabetes, AWV.  Jenelle Mis, FNP

## 2024-03-29 LAB — COMPREHENSIVE METABOLIC PANEL WITH GFR
AG Ratio: 1.3 (calc) (ref 1.0–2.5)
ALT: 12 U/L (ref 6–29)
AST: 20 U/L (ref 10–35)
Albumin: 4.2 g/dL (ref 3.6–5.1)
Alkaline phosphatase (APISO): 91 U/L (ref 37–153)
BUN/Creatinine Ratio: 18 (calc) (ref 6–22)
BUN: 21 mg/dL (ref 7–25)
CO2: 23 mmol/L (ref 20–32)
Calcium: 9.4 mg/dL (ref 8.6–10.4)
Chloride: 105 mmol/L (ref 98–110)
Creat: 1.16 mg/dL — ABNORMAL HIGH (ref 0.50–1.05)
Globulin: 3.3 g/dL (ref 1.9–3.7)
Glucose, Bld: 197 mg/dL — ABNORMAL HIGH (ref 65–99)
Potassium: 4.2 mmol/L (ref 3.5–5.3)
Sodium: 139 mmol/L (ref 135–146)
Total Bilirubin: 0.5 mg/dL (ref 0.2–1.2)
Total Protein: 7.5 g/dL (ref 6.1–8.1)
eGFR: 52 mL/min/{1.73_m2} — ABNORMAL LOW (ref >=60)

## 2024-04-05 ENCOUNTER — Other Ambulatory Visit: Payer: Self-pay | Admitting: Family Medicine

## 2024-04-05 DIAGNOSIS — E1169 Type 2 diabetes mellitus with other specified complication: Secondary | ICD-10-CM

## 2024-04-05 DIAGNOSIS — K219 Gastro-esophageal reflux disease without esophagitis: Secondary | ICD-10-CM

## 2024-04-06 ENCOUNTER — Other Ambulatory Visit: Payer: Self-pay | Admitting: Family Medicine

## 2024-04-06 DIAGNOSIS — E1165 Type 2 diabetes mellitus with hyperglycemia: Secondary | ICD-10-CM

## 2024-04-11 ENCOUNTER — Encounter: Payer: Self-pay | Admitting: Family Medicine

## 2024-04-11 ENCOUNTER — Ambulatory Visit: Admitting: Family Medicine

## 2024-04-11 VITALS — BP 122/80 | HR 87 | Ht 71.0 in | Wt 181.5 lb

## 2024-04-11 DIAGNOSIS — E1159 Type 2 diabetes mellitus with other circulatory complications: Secondary | ICD-10-CM | POA: Diagnosis not present

## 2024-04-11 DIAGNOSIS — E1165 Type 2 diabetes mellitus with hyperglycemia: Secondary | ICD-10-CM

## 2024-04-11 DIAGNOSIS — E785 Hyperlipidemia, unspecified: Secondary | ICD-10-CM

## 2024-04-11 DIAGNOSIS — E1169 Type 2 diabetes mellitus with other specified complication: Secondary | ICD-10-CM

## 2024-04-11 DIAGNOSIS — I152 Hypertension secondary to endocrine disorders: Secondary | ICD-10-CM | POA: Diagnosis not present

## 2024-04-11 DIAGNOSIS — Z794 Long term (current) use of insulin: Secondary | ICD-10-CM

## 2024-04-11 DIAGNOSIS — Z7984 Long term (current) use of oral hypoglycemic drugs: Secondary | ICD-10-CM

## 2024-04-11 NOTE — Assessment & Plan Note (Signed)
 Continue atorvastatin . I recommend consuming a heart healthy diet such as Mediterranean diet or DASH diet with whole grains, fruits, vegetable, fish, lean meats, nuts, and olive oil. Limit sweets and processed foods. I also encourage moderate intensity exercise 150 minutes weekly. This is 3-5 times weekly for 30-50 minutes each session. Goal should be pace of 3 miles/hours, or walking 1.5 miles in 30 minutes. The 10-year ASCVD risk score (Arnett DK, et al., 2019) is: 12.5%

## 2024-04-11 NOTE — Assessment & Plan Note (Signed)
 Continue valsartan  320mg  daily. Report to office if home readings sustain >130/80. Recommend heart healthy diet such as Mediterranean diet with whole grains, fruits, vegetable, fish, lean meats, nuts, and olive oil. Limit salt. Encouraged moderate walking, 3-5 times/week for 30-50 minutes each session. Aim for at least 150 minutes.week. Goal should be pace of 3 miles/hours, or walking 1.5 miles in 30 minutes. Avoid tobacco products. Avoid excess alcohol. Take medications as prescribed and bring medications and blood pressure log with cuff to each office visit. Seek medical care for chest pain, palpitations, shortness of breath with exertion, dizziness/lightheadedness, vision changes, recurrent headaches, or swelling of extremities. Follow up in 2 months

## 2024-04-11 NOTE — Assessment & Plan Note (Signed)
 Continue Trulicity , Amaryl , Tresiba . Counseled on treating low BG and when to seek medical care. A1c and uACR UTD. Foot exam utd. Vaccines today. Retinal eye exam ordered. Recommend heart healthy diet such as Mediterranean diet with whole grains, fruits, vegetable, fish, lean meats, nuts, and olive oil. Limit salt. Encouraged moderate walking, 3-5 times/week for 30-50 minutes each session. Aim for at least 150 minutes.week. Goal should be pace of 3 miles/hours, or walking 1.5 miles in 30 minutes. Seek medical care for urinary frequency, extreme thirst, vision changes, lightheadedness, dizziness.  Follow up in 2 months with labs week prior

## 2024-04-11 NOTE — Progress Notes (Signed)
 Subjective:  HPI: Danielle Hurley is a 63 y.o. female presenting on 04/11/2024 for No chief complaint on file.   HPI Patient is in today for blood pressure and blood sugar follow up. FBG ranges are 72-171. This am was 115 and she is taking Tresiba  18 units daily, Trulicity  3mg  weekly, Amaryl  4mg  daily. BP is above goal in AM just prior to taking her Valsartan .  HYPERTENSION / HYPERLIPIDEMIA Satisfied with current treatment? yes Duration of hypertension: chronic BP monitoring frequency: daily BP range: 129/75-154/91 BP medication side effects: no Past BP meds:  valsartan , hctz Duration of hyperlipidemia: chronic Cholesterol medication side effects: no Cholesterol supplements: none Past cholesterol medications: atorvastatin  Medication compliance: excellent compliance Aspirin: no Recent stressors: no Recurrent headaches: no Visual changes: no Palpitations: no Dyspnea: no Chest pain: no Lower extremity edema: no Dizzy/lightheaded: yes  DIABETES Hypoglycemic episodes:no Polydipsia/polyuria: no Visual disturbance: no Chest pain: no Paresthesias: no Glucose Monitoring: yes  Accucheck frequency: Daily  Fasting glucose: 72-171  Post prandial:  Evening:  Before meals: Taking Insulin ?: yes  Long acting insulin : 18 units daily  Short acting insulin : Blood Pressure Monitoring: daily Retinal Examination: Up to Date Foot Exam: Up to Date Diabetic Education: Completed Pneumovax: Up to Date Influenza: Up to Date Aspirin: no   Review of Systems  All other systems reviewed and are negative.   Relevant past medical history reviewed and updated as indicated.   Past Medical History:  Diagnosis Date   Arthritis    COPD (chronic obstructive pulmonary disease) (HCC)    Diabetes mellitus without complication (HCC)    Dyspnea    GERD (gastroesophageal reflux disease)    Hepatitis C    treated 2022   High cholesterol    History of kidney stones    Hypertension     Tuberculosis    non contagious Dx 05/2020     Past Surgical History:  Procedure Laterality Date   CHOLECYSTECTOMY  1998   COLONOSCOPY  03/22/2023   first colonoscopy   TUBAL LIGATION  01/1995   VIDEO BRONCHOSCOPY WITH ENDOBRONCHIAL NAVIGATION N/A 09/04/2021   Procedure: VIDEO BRONCHOSCOPY WITH ENDOBRONCHIAL NAVIGATION;  Surgeon: Zelphia Higashi, MD;  Location: [facility] OR;  Service: Thoracic;  Laterality: N/A;    Allergies and medications reviewed and updated.   Current Outpatient Medications:    Accu-Chek Softclix Lancets lancets, USE 1  TO CHECK GLUCOSE IN THE MORNING AND 1 AT NOON AND 1 AT BEDTIME, Disp: 100 each, Rfl: 0   aspirin-acetaminophen-caffeine (EXCEDRIN MIGRAINE) 250-250-65 MG tablet, Take 1 tablet by mouth every 6 (six) hours as needed for headache., Disp: , Rfl:    atorvastatin  (LIPITOR) 20 MG tablet, Take 1 tablet (20 mg total) by mouth daily., Disp: 90 tablet, Rfl: 0   Bismuth Subsalicylate (KAOPECTATE) 262 MG TABS, Take 262 mg by mouth daily as needed (upset stomach)., Disp: , Rfl:    Blood Glucose Monitoring Suppl DEVI, 1 each by Does not apply route in the morning, at noon, and at bedtime. May substitute to any manufacturer covered by patient's insurance., Disp: 1 each, Rfl: 0   cetirizine (ZYRTEC) 10 MG tablet, Take 10 mg by mouth daily as needed for allergies., Disp: , Rfl:    Cholecalciferol (VITAMIN D ) 50 MCG (2000 UT) tablet, Take 2,000 Units by mouth daily., Disp: , Rfl:    cyclobenzaprine  (FLEXERIL ) 5 MG tablet, Take 1 tablet (5 mg total) by mouth daily as needed for muscle spasms. for muscle spams, Disp: 10 tablet, Rfl: 0  Dulaglutide  (TRULICITY ) 3 MG/0.5ML SOAJ, Inject 3 mg into the skin once a week., Disp: 2 mL, Rfl: 1   glimepiride  (AMARYL ) 4 MG tablet, Take 1 tablet (4 mg total) by mouth 2 (two) times daily., Disp: 180 tablet, Rfl: 1   Glucose Blood (BLOOD GLUCOSE TEST STRIPS) STRP, 1 each by In Vitro route daily. May substitute to any manufacturer covered  by patient's insurance., Disp: 100 each, Rfl: 0   glucose blood (TRUETRACK TEST) test strip, Bloodsugar testing QD and prn, Disp: 100 each, Rfl: 12   insulin  degludec (TRESIBA ) 100 UNIT/ML FlexTouch Pen, Inject 10 Units into the skin daily., Disp: 3 mL, Rfl: 1   Insulin  Pen Needle (COMFORT TOUCH INSULIN  PEN NEED) 32G X 6 MM MISC, 1 Needle by Does not apply route daily., Disp: 100 each, Rfl: 3   metFORMIN  (GLUCOPHAGE -XR) 500 MG 24 hr tablet, Take 1 tablet (500 mg total) by mouth 2 (two) times daily with a meal., Disp: 180 tablet, Rfl: 1   oxybutynin  (DITROPAN ) 5 MG tablet, Take 1 tablet (5 mg total) by mouth daily., Disp: 90 tablet, Rfl: 1   pantoprazole  (PROTONIX ) 20 MG tablet, Take 1 tablet (20 mg total) by mouth daily., Disp: 90 tablet, Rfl: 1   valsartan  (DIOVAN ) 320 MG tablet, Take 1 tablet (320 mg total) by mouth daily., Disp: 90 tablet, Rfl: 3  No Known Allergies  Objective:   BP 122/80   Pulse 87   Ht 5\' 11"  (1.803 m)   Wt 181 lb 8 oz (82.3 kg)   SpO2 96%   BMI 25.31 kg/m      04/11/2024    4:04 PM 03/28/2024    3:54 PM 03/21/2024    3:38 PM  Vitals with BMI  Height 5\' 11"  5\' 11"  5\' 11"   Weight 181 lbs 8 oz 180 lbs 176 lbs  BMI 25.33 25.12 24.56  Systolic 122 136 161  Diastolic 80 82 68  Pulse 87 82 96     Physical Exam Vitals and nursing note reviewed.  Constitutional:      Appearance: Normal appearance. She is overweight.  HENT:     Head: Normocephalic and atraumatic.  Cardiovascular:     Rate and Rhythm: Normal rate and regular rhythm.     Pulses: Normal pulses.     Heart sounds: Normal heart sounds.  Pulmonary:     Effort: Pulmonary effort is normal.     Breath sounds: Normal breath sounds.  Skin:    General: Skin is warm and dry.  Neurological:     General: No focal deficit present.     Mental Status: She is alert and oriented to person, place, and time. Mental status is at baseline.  Psychiatric:        Mood and Affect: Mood normal.        Behavior:  Behavior normal.        Thought Content: Thought content normal.        Judgment: Judgment normal.     Assessment & Plan:  Type 2 diabetes mellitus with hyperglycemia, without long-term current use of insulin  (HCC) Assessment & Plan: Continue Trulicity , Amaryl , Tresiba . Counseled on treating low BG and when to seek medical care. A1c and uACR UTD. Foot exam utd. Vaccines today. Retinal eye exam ordered. Recommend heart healthy diet such as Mediterranean diet with whole grains, fruits, vegetable, fish, lean meats, nuts, and olive oil. Limit salt. Encouraged moderate walking, 3-5 times/week for 30-50 minutes each session. Aim for at least 150  minutes.week. Goal should be pace of 3 miles/hours, or walking 1.5 miles in 30 minutes. Seek medical care for urinary frequency, extreme thirst, vision changes, lightheadedness, dizziness.  Follow up in 2 months with labs week prior  Orders: -     Comprehensive metabolic panel with GFR; Future -     Lipid panel; Future -     Hemoglobin A1c; Future  Hypertension associated with diabetes [facility]) Assessment & Plan: Continue valsartan  320mg  daily. Report to office if home readings sustain >130/80. Recommend heart healthy diet such as Mediterranean diet with whole grains, fruits, vegetable, fish, lean meats, nuts, and olive oil. Limit salt. Encouraged moderate walking, 3-5 times/week for 30-50 minutes each session. Aim for at least 150 minutes.week. Goal should be pace of 3 miles/hours, or walking 1.5 miles in 30 minutes. Avoid tobacco products. Avoid excess alcohol. Take medications as prescribed and bring medications and blood pressure log with cuff to each office visit. Seek medical care for chest pain, palpitations, shortness of breath with exertion, dizziness/lightheadedness, vision changes, recurrent headaches, or swelling of extremities. Follow up in 2 months  Orders: -     Comprehensive metabolic panel with GFR; Future -     Lipid panel; Future -      Hemoglobin A1c; Future  Hyperlipidemia associated with type 2 diabetes mellitus (HCC) Assessment & Plan: Continue atorvastatin . I recommend consuming a heart healthy diet such as Mediterranean diet or DASH diet with whole grains, fruits, vegetable, fish, lean meats, nuts, and olive oil. Limit sweets and processed foods. I also encourage moderate intensity exercise 150 minutes weekly. This is 3-5 times weekly for 30-50 minutes each session. Goal should be pace of 3 miles/hours, or walking 1.5 miles in 30 minutes. The 10-year ASCVD risk score (Arnett DK, et al., 2019) is: 12.5%   Orders: -     Comprehensive metabolic panel with GFR; Future -     Lipid panel; Future -     Hemoglobin A1c; Future     Follow up plan: Return in about 9 weeks (around 06/13/2024) for chronic follow-up with labs 1 week prior.  Jenelle Mis, FNP

## 2024-04-13 ENCOUNTER — Ambulatory Visit (HOSPITAL_COMMUNITY): Admitting: Mental Health

## 2024-04-13 DIAGNOSIS — F411 Generalized anxiety disorder: Secondary | ICD-10-CM

## 2024-04-13 DIAGNOSIS — F322 Major depressive disorder, single episode, severe without psychotic features: Secondary | ICD-10-CM

## 2024-04-13 NOTE — Progress Notes (Signed)
 THERAPIST PROGRESS NOTE Virtual Visit via Video Note  I connected with Danielle Hurley on 04/13/24 at 11:00 AM EDT by a video enabled telemedicine application and verified that I am speaking with the correct person using two identifiers.  Location: Patient: home address on file Provider: office   I discussed the limitations of evaluation and management by telemedicine and the availability of in person appointments. The patient expressed understanding and agreed to proceed.  I discussed the assessment and treatment plan with the patient. The patient was provided an opportunity to ask questions and all were answered. The patient agreed with the plan and demonstrated an understanding of the instructions.   The patient was advised to call back or seek an in-person evaluation if the symptoms worsen or if the condition fails to improve as anticipated.  I provided 36 minutes of non-face-to-face time during this encounter.   Loman Risk, [facility]   Session Time: 11:05 am   Participation Level: Active  Behavioral Response: CasualAlertEuthymic  Type of Therapy: Individual therapy  Treatment Goals addressed: STG: [place] will increase management of depressive sxs AEB development of effective coping skills with ability to process thoughts in balanced manner per self report with the next 90 days.   ProgressTowards Goals: Progressing  Interventions: CBT and Supportive  Summary: Danielle Hurley is a 63 y.o. female who presents with dx  of major depression and generalized anxiety disorder. Danielle Hurley presents for session alert and oriented; mood and affect stable; WNL. Speech clear and coherent at normal rate and tone. Engaged and receptive to interventions. Shares to be doing wel and for moods to be stable. Notes daughter can cause anxiety as she can come in from work irritable and thus gets her feeling on edge. Shares to walk the dogs as mans of coping. Notes presence of flashbacks of brother's  passing and can see the images of him in the street in her minds eye. Notes to allow them to come and go and does not feel a high degree of distress from this. Shares of upcoming events with grand children. Shares thoughts of brother and processing feelings of grief and missing brother. Notes at times can be triggered by medical shows but continues to watch as she enjoys. Notes to be doing well and denies concerns for depression. No safety concerns reported.   Suicidal/Homicidal: Nowithout intent/plan  Therapist Response: Therapist engaged Sherman in therapy session. Completed check in and assessed for current level of functioning sxs management and level of stressors. Provided safe space to share thoughts and feelings in regards to feelings of grief and normalized feelings. Assessed for ability to cope with feelings of anxiety and assessed for overall level of functioning. Supported in processing thoughts in balanced manner. Reviewed progress in therapy and factors that are supportive of managing mental health and quality of life. Reviewed session and provided follow up.  Plan: Return again in  x 6 weeks.  Diagnosis: GAD (generalized anxiety disorder)  Severe major depression without psychotic features (HCC)  Collaboration of Care: Other None  Patient/Guardian was advised Release of Information must be obtained prior to any record release in order to collaborate their care with an outside provider. Patient/Guardian was advised if they have not already done so to contact the registration department to sign all necessary forms in order for us  to release information regarding their care.   Consent: Patient/Guardian gives verbal consent for treatment and assignment of benefits for services provided during this visit. Patient/Guardian expressed understanding and agreed  to proceed.   Carmel Chimes [place], [facility] 04/13/2024

## 2024-04-25 ENCOUNTER — Other Ambulatory Visit: Payer: Self-pay | Admitting: Family Medicine

## 2024-04-25 DIAGNOSIS — K219 Gastro-esophageal reflux disease without esophagitis: Secondary | ICD-10-CM

## 2024-04-25 DIAGNOSIS — E1165 Type 2 diabetes mellitus with hyperglycemia: Secondary | ICD-10-CM

## 2024-04-25 DIAGNOSIS — E1169 Type 2 diabetes mellitus with other specified complication: Secondary | ICD-10-CM

## 2024-04-27 ENCOUNTER — Other Ambulatory Visit: Payer: Self-pay | Admitting: Family Medicine

## 2024-04-27 DIAGNOSIS — E1165 Type 2 diabetes mellitus with hyperglycemia: Secondary | ICD-10-CM

## 2024-05-03 ENCOUNTER — Other Ambulatory Visit: Payer: Self-pay | Admitting: Family Medicine

## 2024-05-03 DIAGNOSIS — E1165 Type 2 diabetes mellitus with hyperglycemia: Secondary | ICD-10-CM

## 2024-05-03 NOTE — Telephone Encounter (Signed)
 Copied from CRM [id number]. Topic: Clinical - Medication Refill >> May 03, 2024  4:34 PM Fredrica W wrote: Medication: Dulaglutide  (TRULICITY ) 3 MG/0.5ML SOAJ - states provider may want to raise to 4   Has the patient contacted their pharmacy? Yes (Agent: If no, request that the patient contact the pharmacy for the refill. If patient does not wish to contact the pharmacy document the reason why and proceed with request.) (Agent: If yes, when and what did the pharmacy advise?) sent request no response   This is the patient's preferred pharmacy:  [id number] [place], [place] - [address] [place] [address] Phone: [phone] Fax: [phone]  Is this the correct pharmacy for this prescription? Yes If no, delete pharmacy and type the correct one.   Has the prescription been filled recently? No  Is the patient out of the medication? Yes - out for 2 weeks   Has the patient been seen for an appointment in the last year OR does the patient have an upcoming appointment? Yes  Can we respond through MyChart? No  Agent: Please be advised that Rx refills may take up to 3 business days. We ask that you follow-up with your pharmacy.

## 2024-05-04 NOTE — Telephone Encounter (Signed)
 Requested medications are due for refill today.  See note  Requested medications are on the active medications list.  See note  Last refill. 02/01/2024 2mL 1 rf  Future visit scheduled.   yes  Notes to clinic.  Medication: Dulaglutide  (TRULICITY ) 3 MG/0.5ML SOAJ - states provider may want to raise to 4       Requested Prescriptions  Pending Prescriptions Disp Refills   Dulaglutide  (TRULICITY ) 3 MG/0.5ML SOAJ 2 mL 1    Sig: Inject 3 mg into the skin once a week.     Endocrinology:  Diabetes - GLP-1 Receptor Agonists Failed - 05/04/2024  4:20 PM      Failed - HBA1C is between 0 and 7.9 and within 180 days    HbA1c POC (<> result, manual entry)  Date Value Ref Range Status  03/04/2023 11.5 4.0 - 5.6 % Final   Hgb A1c MFr Bld  Date Value Ref Range Status  03/07/2024 11.0 (H) <5.7 % of total Hgb Final    Comment:    For someone without known diabetes, a hemoglobin A1c value of 6.5% or greater indicates that they may have  diabetes and this should be confirmed with a follow-up  test. . For someone with known diabetes, a value <7% indicates  that their diabetes is well controlled and a value  greater than or equal to 7% indicates suboptimal  control. A1c targets should be individualized based on  duration of diabetes, age, comorbid conditions, and  other considerations. . Currently, no consensus exists regarding use of hemoglobin A1c for diagnosis of diabetes for children. .          Failed - Valid encounter within last 6 months    Recent Outpatient Visits           3 weeks ago Type 2 diabetes mellitus with hyperglycemia, without long-term current use of insulin  [facility])   [facility] Jenelle Mis, FNP   1 month ago Type 2 diabetes mellitus with hyperglycemia, without long-term current use of insulin  [facility])   [place] [facility] Jenelle Mis, FNP   1 month ago Physical exam, annual   [place] Jenelle Mis, FNP       Future Appointments             In 2 months Katheryne Pane, Frederico Jan, MD [facility] [employer] [facility] [employer]

## 2024-05-08 MED ORDER — TRULICITY 3 MG/0.5ML ~~LOC~~ SOAJ
3.0000 mg | SUBCUTANEOUS | 1 refills | Status: DC
Start: 2024-05-08 — End: 2024-07-17

## 2024-05-12 ENCOUNTER — Other Ambulatory Visit: Payer: Self-pay | Admitting: Family Medicine

## 2024-05-12 DIAGNOSIS — E1169 Type 2 diabetes mellitus with other specified complication: Secondary | ICD-10-CM

## 2024-06-07 ENCOUNTER — Other Ambulatory Visit

## 2024-06-07 DIAGNOSIS — I152 Hypertension secondary to endocrine disorders: Secondary | ICD-10-CM

## 2024-06-07 DIAGNOSIS — E1165 Type 2 diabetes mellitus with hyperglycemia: Secondary | ICD-10-CM

## 2024-06-07 DIAGNOSIS — E1169 Type 2 diabetes mellitus with other specified complication: Secondary | ICD-10-CM

## 2024-06-08 LAB — LIPID PANEL
Cholesterol: 115 mg/dL (ref <200)
HDL: 46 mg/dL — ABNORMAL LOW (ref >=50)
LDL Cholesterol (Calc): 53 mg/dL
Non-HDL Cholesterol (Calc): 69 mg/dL (ref <130)
Total CHOL/HDL Ratio: 2.5 (calc) (ref <5.0)
Triglycerides: 80 mg/dL (ref <150)

## 2024-06-08 LAB — HEMOGLOBIN A1C
Hgb A1c MFr Bld: 7.2 % — ABNORMAL HIGH (ref <5.7)
Mean Plasma Glucose: 160 mg/dL
eAG (mmol/L): 8.9 mmol/L

## 2024-06-08 LAB — COMPREHENSIVE METABOLIC PANEL WITH GFR
AG Ratio: 1.3 (calc) (ref 1.0–2.5)
ALT: 10 U/L (ref 6–29)
AST: 20 U/L (ref 10–35)
Albumin: 4.4 g/dL (ref 3.6–5.1)
Alkaline phosphatase (APISO): 96 U/L (ref 37–153)
BUN/Creatinine Ratio: 20 (calc) (ref 6–22)
BUN: 25 mg/dL (ref 7–25)
CO2: 27 mmol/L (ref 20–32)
Calcium: 9.7 mg/dL (ref 8.6–10.4)
Chloride: 106 mmol/L (ref 98–110)
Creat: 1.23 mg/dL — ABNORMAL HIGH (ref 0.50–1.05)
Globulin: 3.3 g/dL (ref 1.9–3.7)
Glucose, Bld: 114 mg/dL — ABNORMAL HIGH (ref 65–99)
Potassium: 4.3 mmol/L (ref 3.5–5.3)
Sodium: 143 mmol/L (ref 135–146)
Total Bilirubin: 0.5 mg/dL (ref 0.2–1.2)
Total Protein: 7.7 g/dL (ref 6.1–8.1)
eGFR: 49 mL/min/1.73m2 — ABNORMAL LOW (ref >=60)

## 2024-06-12 ENCOUNTER — Ambulatory Visit: Payer: Self-pay | Admitting: Family Medicine

## 2024-06-12 ENCOUNTER — Encounter: Payer: Self-pay | Admitting: Physician Assistant

## 2024-06-12 ENCOUNTER — Ambulatory Visit: Attending: Physician Assistant | Admitting: Physician Assistant

## 2024-06-12 ENCOUNTER — Ambulatory Visit (INDEPENDENT_AMBULATORY_CARE_PROVIDER_SITE_OTHER): Admitting: Mental Health

## 2024-06-12 VITALS — BP 115/50 | HR 87 | Ht 71.0 in | Wt 180.2 lb

## 2024-06-12 DIAGNOSIS — E1159 Type 2 diabetes mellitus with other circulatory complications: Secondary | ICD-10-CM

## 2024-06-12 DIAGNOSIS — E785 Hyperlipidemia, unspecified: Secondary | ICD-10-CM

## 2024-06-12 DIAGNOSIS — R072 Precordial pain: Secondary | ICD-10-CM | POA: Diagnosis not present

## 2024-06-12 DIAGNOSIS — I498 Other specified cardiac arrhythmias: Secondary | ICD-10-CM

## 2024-06-12 DIAGNOSIS — F322 Major depressive disorder, single episode, severe without psychotic features: Secondary | ICD-10-CM

## 2024-06-12 DIAGNOSIS — F411 Generalized anxiety disorder: Secondary | ICD-10-CM

## 2024-06-12 DIAGNOSIS — I152 Hypertension secondary to endocrine disorders: Secondary | ICD-10-CM

## 2024-06-12 DIAGNOSIS — E1169 Type 2 diabetes mellitus with other specified complication: Secondary | ICD-10-CM

## 2024-06-12 NOTE — Progress Notes (Unsigned)
   THERAPIST PROGRESS NOTE Virtual Visit via Video Note  I connected with Danielle Hurley on 06/12/2024 at 11:00 AM EDT by a video enabled telemedicine application and verified that I am speaking with the correct person using two identifiers.  Location: Patient: home address on file Provider: home office   I discussed the limitations of evaluation and management by telemedicine and the availability of in person appointments. The patient expressed understanding and agreed to proceed.   I discussed the assessment and treatment plan with the patient. The patient was provided an opportunity to ask questions and all were answered. The patient agreed with the plan and demonstrated an understanding of the instructions.   The patient was advised to call back or seek an in-person evaluation if the symptoms worsen or if the condition fails to improve as anticipated.  I provided 46 minutes of non-face-to-face time during this encounter.   Danielle Hurley, [facility]   Session Time: 11: 05 am ( 46 minutes)  Participation Level: Active  Behavioral Response: CasualAlertWNL  Type of Therapy: Individual Therapy  Treatment Goals addressed:  STG: Danielle Hurley will increase management of depressive sxs AEB development of effective coping skills with ability to process thoughts in balanced manner per self report with the next 90 days.   ProgressTowards Goals: Progressing  Interventions: Supportive  Summary: Danielle Hurley is a 63 y.o. female who presents with dx  of major depression and generalized anxiety disorder. Danielle Hurley presents for session alert and oriented; mood and affect stable; WNL. Speech clear and coherent at normal rate and tone. Engaged and receptive to interventions. Shares to be doing  ok but I am not happy Shares ongoing adjustment to living with daughter and spends most time in her room but will come out to eat dinner with the family. Shares concern for her dog and declining health as well as  ongoing missing of her brother and shares memories.  I get lonely Explores with therapist friendships and agrees in exploring engagement in community and decreased isolation. Denies safety concerns. Progress with goals; decreased depressive feelings enjoys games on phone as coping skill   Suicidal/Homicidal: Nowithout intent/plan  Therapist Response: Therapist engaged Danielle Hurley in therapy session. Completed check in and assessed for current level of functioning sxs management and level of stressors. Provided safe space to share thoughts and feelings in regards to feelings of grief and normalized feelings. Assessed for ability to cope with adjustments in the home. Encouraged engagement with family and explored desire to increase sense of community in area and development of friendships. Provided information on [facility] and encouraged presentation. Reviewed session and provided follow up.   Plan: Return again in  x 8 weeks.  Diagnosis: Severe major depression without psychotic features (HCC)  GAD (generalized anxiety disorder)  Collaboration of Care: Other None  Patient/Guardian was advised Release of Information must be obtained prior to any record release in order to collaborate their care with an outside provider. Patient/Guardian was advised if they have not already done so to contact the registration department to sign all necessary forms in order for us  to release information regarding their care.   Consent: Patient/Guardian gives verbal consent for treatment and assignment of benefits for services provided during this visit. Patient/Guardian expressed understanding and agreed to proceed.   Danielle Hurley, [facility] 06/12/2024

## 2024-06-12 NOTE — Assessment & Plan Note (Signed)
 Hypertension well-controlled with current medication regimen. - Continue valsartan  320 mg daily.

## 2024-06-12 NOTE — Progress Notes (Signed)
 OFFICE NOTE:    Date:  06/12/2024  ID:  Danielle Hurley, DOB 1961-11-17, MRN [id number] PCP: Kayla Jeoffrey RAMAN, FNP  [facility] Providers Cardiologist:  None        Hypertension  Hyperlipidemia  Diabetes mellitus  Chronic kidney disease eGFR 49 Chronic Obstructive Pulmonary Disease  GERD Hep C Latent TB infection        Discussed the use of AI scribe software for clinical note transcription with the patient, who gave verbal consent to proceed. History of Present Illness Danielle Hurley is a 63 y.o. female who is referred by Kayla Jeoffrey RAMAN, FNP for an abnormal EKG.   She experiences chest pain described as a sensation of someone trying to pull out her spine, radiating to her back and occasionally to her arms. The pain occurs randomly, not related to exertion, and lasts about a minute. During these episodes, she experiences shortness of breath and sweating, but no nausea or syncope. These symptoms have been present for a couple of years. She has a history of an abnormal EKG performed over a year ago, which led to her referral to cardiology. However, she delayed the appointment due to caregiving responsibilities for her brother, who has since passed away.  She quit smoking in 2014 after 40 years of smoking a pack a day.  She moved from [place]  in October 2021.  She does not know her FHx. She is adopted.     ROS-See HPI     Studies Reviewed:  EKG Interpretation Date/Time:  Monday June 12 2024 13:41:19 EDT Ventricular Rate:  87 PR Interval:  114 QRS Duration:  72 QT Interval:  358 QTC Calculation: 430 R Axis:   73  Text Interpretation: Normal sinus rhythm Normal ECG Confirmed by Lelon Hamilton [phone]) on 06/12/2024 1:58:59 PM    Labs from chart review 03/07/24: TSH 1.35 06/07/24: SCr 1.23, eGFR 49, K 4.3, ALT 10, A1c 7.2, TC 115, HDL 46, Trig 80, LDL 53           Physical Exam:  VS:  BP (!) 115/50   Pulse 87   Ht 5' 11 (1.803 m)   Wt 180 lb 3.2 oz (81.7 kg)    SpO2 96%   BMI 25.13 kg/m        Wt Readings from Last 3 Encounters:  06/12/24 180 lb 3.2 oz (81.7 kg)  04/11/24 181 lb 8 oz (82.3 kg)  03/28/24 180 lb (81.6 kg)    Constitutional:      Appearance: Healthy appearance. Not in distress.  Neck:     Vascular: No carotid bruit or JVR. JVD normal.  Pulmonary:     Breath sounds: Normal breath sounds. No wheezing. No rales.  Cardiovascular:     Normal rate. Regular rhythm.     Murmurs: There is no murmur.  Edema:    Peripheral edema absent.  Abdominal:     Palpations: Abdomen is soft.       Assessment and Plan:    Assessment & Plan Precordial chest pain She notes intermittent chest pain with radiation to her back.  She has not had ischemic changes on EKG. Risk factors include diabetes, hypertension, hyperlipidemia, and smoking history.  We discussed stress testing versus coronary CTA.  Given chronic kidney disease, I would like to avoid contrast if at all possible. - Order PET myocardial perfusion study to rule out ischemia.  - Order echocardiogram to rule out structural heart disease and aortic dilation. - Follow up in  three months after testing is complete. Junctional rhythm EKG on March 07, 2024 (personally reviewed and interpreted by me today 06/12/2024), showed sinus rhythm with intermittent junctional rhythm. Heart rate 92 bpm.  She is asymptomatic.  No further testing needed at this time. Hypertension associated with diabetes (HCC) Hypertension well-controlled with current medication regimen. - Continue valsartan  320 mg daily. Hyperlipidemia associated with type 2 diabetes mellitus (HCC) Hyperlipidemia well-managed with current medication. LDL in July 2025 was 53 mg/dL, optimal. - Continue Lipitor 20 mg daily.      Informed Consent   Shared Decision Making/Informed Consent The risks [chest pain, shortness of breath, cardiac arrhythmias, dizziness, blood pressure fluctuations, myocardial infarction, stroke/transient ischemic  attack, nausea, vomiting, allergic reaction, radiation exposure, metallic taste sensation and life-threatening complications (estimated to be 1 in 10,000)], benefits (risk stratification, diagnosing coronary artery disease, treatment guidance) and alternatives of a cardiac PET stress test were discussed in detail with Danielle Hurley and she agrees to proceed.     Dispo:  Return in about 3 months (around 09/12/2024) for Follow up after testing, w/ Danielle Ferrier, PA-C.  Signed, Danielle Ferrier, PA-C

## 2024-06-12 NOTE — Patient Instructions (Signed)
 Medication Instructions:  Your physician recommends that you continue on your current medications as directed. Please refer to the Current Medication list given to you today.  *If you need a refill on your cardiac medications before your next appointment, please call your pharmacy*  Lab Work: None ordered  If you have labs (blood work) drawn today and your tests are completely normal, you will receive your results only by: MyChart Message (if you have MyChart) OR A paper copy in the mail If you have any lab test that is abnormal or we need to change your treatment, we will call you to review the results.  Testing/Procedures: Your physician has requested that you have an echocardiogram. Echocardiography is a painless test that uses sound waves to create images of your heart. It provides your doctor with information about the size and shape of your heart and how well your heart's chambers and valves are working. This procedure takes approximately one hour. There are no restrictions for this procedure. Please do NOT wear cologne, perfume, aftershave, or lotions (deodorant is allowed). Please arrive 15 minutes prior to your appointment time.  Please note: We ask at that you not bring children with you during ultrasound (echo/ vascular) testing. Due to room size and safety concerns, children are not allowed in the ultrasound rooms during exams. Our front office staff cannot provide observation of children in our lobby area while testing is being conducted. An adult accompanying a patient to their appointment will only be allowed in the ultrasound room at the discretion of the ultrasound technician under special circumstances. We apologize for any inconvenience.      Please report to Radiology at the [facility] Main Entrance 30 minutes early for your test.  [address] [place], [place] [address]                         OR   Please report to Radiology at [facility] Main Entrance, medical mall, 30 mins prior to your test.  [address]  [place], [place]  How to Prepare for Your Cardiac PET/CT Stress Test:  Nothing to eat or drink, except water, 3 hours prior to arrival time.  NO caffeine/decaffeinated products, or chocolate 12 hours prior to arrival. (Please note decaffeinated beverages (teas/coffees) still contain caffeine).  If you have caffeine within 12 hours prior, the test will need to be rescheduled.  Medication instructions: Do not take nitrates (isosorbide mononitrate, Ranexa) the day before or day of test Do not take tamsulosin the day before or morning of test Hold theophylline containing medications for 12 hours. Hold Dipyridamole 48 hours prior to the test.  Diabetic Preparation: If able to eat breakfast prior to 3 hour fasting, you may take all medications, including your insulin . Do not worry if you miss your breakfast dose of insulin  - start at your next meal. If you do not eat prior to 3 hour fast-Hold all diabetes (oral and insulin ) medications. Patients who wear a continuous glucose monitor MUST remove the device prior to scanning.  You may take your remaining medications with water.  NO perfume, cologne or lotion on chest or abdomen area. FEMALES - Please avoid wearing dresses to this appointment.  Total time is 1 to 2 hours; you may want to bring reading material for the waiting time.  IF YOU THINK YOU MAY BE PREGNANT, OR ARE NURSING PLEASE INFORM THE TECHNOLOGIST.  In preparation for your  appointment, medication and supplies will be purchased.  Appointment availability is limited, so if you need to cancel or reschedule, please call the Radiology Department Scheduler at [phone] 24 hours in advance to avoid a cancellation fee of $100.00  What to Expect When you Arrive:  Once you arrive and check in for your appointment, you will be taken to a preparation room within the Radiology Department.  A  technologist or Nurse will obtain your medical history, verify that you are correctly prepped for the exam, and explain the procedure.  Afterwards, an IV will be started in your arm and electrodes will be placed on your skin for EKG monitoring during the stress portion of the exam. Then you will be escorted to the PET/CT scanner.  There, staff will get you positioned on the scanner and obtain a blood pressure and EKG.  During the exam, you will continue to be connected to the EKG and blood pressure machines.  A small, safe amount of a radioactive tracer will be injected in your IV to obtain a series of pictures of your heart along with an injection of a stress agent.    After your Exam:  It is recommended that you eat a meal and drink a caffeinated beverage to counter act any effects of the stress agent.  Drink plenty of fluids for the remainder of the day and urinate frequently for the first couple of hours after the exam.  Your doctor will inform you of your test results within 7-10 business days.  For more information and frequently asked questions, please visit our website: [email or web address]  For questions about your test or how to prepare for your test, please call: Cardiac Imaging Nurse Navigators Office: [phone]   Follow-Up: At [facility], you and your health needs are our priority.  As part of our continuing mission to provide you with exceptional heart care, our providers are all part of one team.  This team includes your primary Cardiologist (physician) and Advanced Practice Providers or APPs (Physician Assistants and Nurse Practitioners) who all work together to provide you with the care you need, when you need it.  Your next appointment:   3 month(s)  Provider:   Glendia Ferrier, PA-C          We recommend signing up for the patient portal called MyChart.  Sign up information is provided on this After Visit Summary.  MyChart is used to connect with  patients for Virtual Visits (Telemedicine).  Patients are able to view lab/test results, encounter notes, upcoming appointments, etc.  Non-urgent messages can be sent to your provider as well.   To learn more about what you can do with MyChart, go to [email or web address].   Other Instructions

## 2024-06-12 NOTE — Assessment & Plan Note (Signed)
 Hyperlipidemia well-managed with current medication. LDL in July 2025 was 53 mg/dL, optimal. - Continue Lipitor 20 mg daily.

## 2024-06-14 ENCOUNTER — Ambulatory Visit: Admitting: Family Medicine

## 2024-06-14 ENCOUNTER — Encounter: Payer: Self-pay | Admitting: Family Medicine

## 2024-06-14 VITALS — BP 118/70 | HR 82 | Ht 71.0 in | Wt 182.4 lb

## 2024-06-14 DIAGNOSIS — E1165 Type 2 diabetes mellitus with hyperglycemia: Secondary | ICD-10-CM

## 2024-06-14 DIAGNOSIS — I152 Hypertension secondary to endocrine disorders: Secondary | ICD-10-CM

## 2024-06-14 DIAGNOSIS — E1159 Type 2 diabetes mellitus with other circulatory complications: Secondary | ICD-10-CM

## 2024-06-14 MED ORDER — FREESTYLE LIBRE 14 DAY READER DEVI
1.0000 | 0 refills | Status: DC
Start: 2024-06-14 — End: 2024-06-20

## 2024-06-14 MED ORDER — CICLOPIROX 8 % EX SOLN: Freq: Every day | CUTANEOUS | 0 refills | Status: AC

## 2024-06-14 MED ORDER — FREESTYLE LIBRE 14 DAY SENSOR MISC: 1.0000 | 11 refills | Status: DC

## 2024-06-14 NOTE — Progress Notes (Signed)
 Subjective:  HPI: Danielle Hurley is a 63 y.o. female presenting on 06/14/2024 for Follow-up (2 month follow up. Pt has been having issues with low readings. PHQ-9 score of 4.) and Nail Problem (Pt c/o issues with brittle nails and changes in nail color. )   HPI Patient is in today for follow up for HTN and DM. Currently taking Valsartan  320mg  daily, Trulicity  3 mg weekly, Amaryl  4mg  BID, and Tresiba  10 units daily. Her most recent A1c was 7.2%. Today she reports hypoglycemia as low as 61 on multiple instances. See media for FBG readings and BP. BP cuff did correlate with office readings however home readings have been above 80 DBP. At cardiology several days ago BP was 115/50.  HYPERTENSION / HYPERLIPIDEMIA Satisfied with current treatment? yes Duration of hypertension: chronic BP monitoring frequency: daily BP range: 124/86 - 154/84 BP medication side effects: no Past BP meds: valsartan , hydrochlorothiazide , lisinopril Duration of hyperlipidemia: chronic Cholesterol medication side effects: no Cholesterol supplements: none Past cholesterol medications: atorvastatin  Medication compliance: excellent compliance Aspirin: yes Recent stressors: no Recurrent headaches: no Visual changes: no Palpitations: no Dyspnea: no Chest pain: no Lower extremity edema: no Dizzy/lightheaded: no  DIABETES Hypoglycemic episodes:yes Polydipsia/polyuria: no Visual disturbance: no Chest pain: no Paresthesias: no Glucose Monitoring: yes  Accucheck frequency: Daily  Fasting glucose: 61-138  Post prandial:  Evening:  Before meals: Taking Insulin ?: yes  Long acting insulin : tresiba  10 units daily  Short acting insulin : Blood Pressure Monitoring: daily Retinal Examination: Up to Date Foot Exam: Not up to Date Diabetic Education: Completed Pneumovax: Up to Date Influenza: Up to Date Aspirin: no   Review of Systems  All other systems reviewed and are negative.   Relevant past medical  history reviewed and updated as indicated.   Past Medical History:  Diagnosis Date   Arthritis    COPD (chronic obstructive pulmonary disease) (HCC)    Diabetes mellitus without complication (HCC)    Dyspnea    GERD (gastroesophageal reflux disease)    Hepatitis C    treated 2022   High cholesterol    History of kidney stones    Hypertension    Tuberculosis    non contagious Dx 05/2020     Past Surgical History:  Procedure Laterality Date   CHOLECYSTECTOMY  1998   COLONOSCOPY  03/22/2023   first colonoscopy   TUBAL LIGATION  01/1995   VIDEO BRONCHOSCOPY WITH ENDOBRONCHIAL NAVIGATION N/A 09/04/2021   Procedure: VIDEO BRONCHOSCOPY WITH ENDOBRONCHIAL NAVIGATION;  Surgeon: Kerrin Elspeth BROCKS, MD;  Location: [facility] OR;  Service: Thoracic;  Laterality: N/A;    Allergies and medications reviewed and updated.   Current Outpatient Medications:    Accu-Chek Softclix Lancets lancets, USE 1  TO CHECK GLUCOSE IN THE MORNING AND 1 AT NOON AND 1 AT BEDTIME, Disp: 100 each, Rfl: 0   aspirin-acetaminophen-caffeine (EXCEDRIN MIGRAINE) 250-250-65 MG tablet, Take 1 tablet by mouth every 6 (six) hours as needed for headache., Disp: , Rfl:    atorvastatin  (LIPITOR) 20 MG tablet, Take 1 tablet (20 mg total) by mouth daily., Disp: 90 tablet, Rfl: 0   Bismuth Subsalicylate (KAOPECTATE) 262 MG TABS, Take 262 mg by mouth daily as needed (upset stomach)., Disp: , Rfl:    Blood Glucose Monitoring Suppl DEVI, 1 each by Does not apply route in the morning, at noon, and at bedtime. May substitute to any manufacturer covered by patient's insurance., Disp: 1 each, Rfl: 0   cetirizine (ZYRTEC) 10 MG tablet, Take 10 mg by  mouth daily as needed for allergies., Disp: , Rfl:    Cholecalciferol (VITAMIN D ) 50 MCG (2000 UT) tablet, Take 2,000 Units by mouth daily., Disp: , Rfl:    ciclopirox  (PENLAC ) 8 % solution, Apply topically at bedtime. Apply over nail and surrounding skin. Apply daily over previous coat. After  seven (7) days, may remove with alcohol and continue cycle., Disp: 6.6 mL, Rfl: 0   Continuous Glucose Receiver (FREESTYLE LIBRE 14 DAY READER) DEVI, 1 Application by Does not apply route every 14 (fourteen) days. Use with sensor system, Disp: 1 each, Rfl: 0   Continuous Glucose Sensor (FREESTYLE LIBRE 14 DAY SENSOR) MISC, 1 Application by Does not apply route every 14 (fourteen) days. Apply upper deltoid every 14 days, use reader to determine blood sugars, Disp: 2 each, Rfl: 11   cyclobenzaprine  (FLEXERIL ) 5 MG tablet, Take 1 tablet (5 mg total) by mouth daily as needed for muscle spasms. for muscle spams, Disp: 10 tablet, Rfl: 0   Dulaglutide  (TRULICITY ) 3 MG/0.5ML SOAJ, Inject 3 mg into the skin once a week., Disp: 2 mL, Rfl: 1   glimepiride  (AMARYL ) 4 MG tablet, Take 1 tablet (4 mg total) by mouth 2 (two) times daily., Disp: 180 tablet, Rfl: 1   Glucose Blood (BLOOD GLUCOSE TEST STRIPS) STRP, 1 each by In Vitro route daily. May substitute to any manufacturer covered by patient's insurance., Disp: 100 each, Rfl: 0   glucose blood (TRUETRACK TEST) test strip, Bloodsugar testing QD and prn, Disp: 100 each, Rfl: 12   insulin  degludec (TRESIBA ) 100 UNIT/ML FlexTouch Pen, Inject 10 Units into the skin daily., Disp: 3 mL, Rfl: 1   pantoprazole  (PROTONIX ) 20 MG tablet, Take 1 tablet (20 mg total) by mouth daily., Disp: 90 tablet, Rfl: 1   valsartan  (DIOVAN ) 320 MG tablet, Take 1 tablet (320 mg total) by mouth daily., Disp: 90 tablet, Rfl: 3   Insulin  Pen Needle (COMFORT TOUCH INSULIN  PEN NEED) 32G X 6 MM MISC, 1 Needle by Does not apply route daily. (Patient not taking: Reported on 06/14/2024), Disp: 100 each, Rfl: 3  No Known Allergies  Objective:   BP 118/70   Pulse 82   Ht 5' 11 (1.803 m)   Wt 182 lb 6.4 oz (82.7 kg)   SpO2 98%   BMI 25.44 kg/m      06/14/2024    2:36 PM 06/12/2024    1:39 PM 04/11/2024    4:04 PM  Vitals with BMI  Height 5' 11 5' 11 5' 11  Weight 182 lbs 6 oz 180 lbs 3  oz 181 lbs 8 oz  BMI 25.45 25.14 25.33  Systolic 118 115 877  Diastolic 70 50 80  Pulse 82 87 87     Physical Exam Vitals and nursing note reviewed.  Constitutional:      Appearance: Normal appearance. She is normal weight.  HENT:     Head: Normocephalic and atraumatic.  Cardiovascular:     Rate and Rhythm: Normal rate and regular rhythm.     Pulses: Normal pulses.     Heart sounds: Normal heart sounds.  Pulmonary:     Effort: Pulmonary effort is normal.     Breath sounds: Normal breath sounds.  Skin:    General: Skin is warm and dry.  Neurological:     General: No focal deficit present.     Mental Status: She is alert and oriented to person, place, and time. Mental status is at baseline.  Psychiatric:  Mood and Affect: Mood normal.        Behavior: Behavior normal.        Thought Content: Thought content normal.        Judgment: Judgment normal.     Assessment & Plan:  Type 2 diabetes mellitus with hyperglycemia, without long-term current use of insulin  (HCC) Assessment & Plan: Continue Trulicity  3mg  weekly and Tresiba  10 units daily. Discontinue Amaryl  due to hypoglycemia. Notify office if FBG sustain >120 or ongoing hypoglycemia. She had at least 5 readings where FBG was 60-70, will try to get approved for [place]. Counseled on treating low BG and when to seek medical care. A1c and uACR UTD. Foot exam utd. Vaccines today. Retinal eye exam ordered. Recommend heart healthy diet such as Mediterranean diet with whole grains, fruits, vegetable, fish, lean meats, nuts, and olive oil. Limit salt. Encouraged moderate walking, 3-5 times/week for 30-50 minutes each session. Aim for at least 150 minutes.week. Goal should be pace of 3 miles/hours, or walking 1.5 miles in 30 minutes. Seek medical care for urinary frequency, extreme thirst, vision changes, lightheadedness, dizziness.  Follow up in 3 months with labs week prior   Hypertension associated with diabetes  [facility]) Assessment & Plan: Continue valsartan  320mg  daily. Reluctant to increase based on home readings when office reading is 117/70 today and 115/50 2 days ago. Report to office if home readings sustain >130/80. Recommend heart healthy diet such as Mediterranean diet with whole grains, fruits, vegetable, fish, lean meats, nuts, and olive oil. Limit salt. Encouraged moderate walking, 3-5 times/week for 30-50 minutes each session. Aim for at least 150 minutes.week. Goal should be pace of 3 miles/hours, or walking 1.5 miles in 30 minutes. Avoid tobacco products. Avoid excess alcohol. Take medications as prescribed and bring medications and blood pressure log with cuff to each office visit. Seek medical care for chest pain, palpitations, shortness of breath with exertion, dizziness/lightheadedness, vision changes, recurrent headaches, or swelling of extremities. Follow up in 3 months   Other orders -     FreeStyle Libre 14 Day Sensor; 1 Application by Does not apply route every 14 (fourteen) days. Apply upper deltoid every 14 days, use reader to determine blood sugars  Dispense: 2 each; Refill: 11 -     FreeStyle Libre 14 Day Reader; 1 Application by Does not apply route every 14 (fourteen) days. Use with sensor system  Dispense: 1 each; Refill: 0 -     Ciclopirox ; Apply topically at bedtime. Apply over nail and surrounding skin. Apply daily over previous coat. After seven (7) days, may remove with alcohol and continue cycle.  Dispense: 6.6 mL; Refill: 0     Follow up plan: Return in about 3 months (around 09/14/2024) for diabetes, chronic follow-up with labs 1 week prior.  Jeoffrey GORMAN Barrio, FNP

## 2024-06-14 NOTE — Assessment & Plan Note (Signed)
 Continue Trulicity  3mg  weekly and Tresiba  10 units daily. Discontinue Amaryl  due to hypoglycemia. Notify office if FBG sustain >120 or ongoing hypoglycemia. She had at least 5 readings where FBG was 60-70, will try to get approved for [place]. Counseled on treating low BG and when to seek medical care. A1c and uACR UTD. Foot exam utd. Vaccines today. Retinal eye exam ordered. Recommend heart healthy diet such as Mediterranean diet with whole grains, fruits, vegetable, fish, lean meats, nuts, and olive oil. Limit salt. Encouraged moderate walking, 3-5 times/week for 30-50 minutes each session. Aim for at least 150 minutes.week. Goal should be pace of 3 miles/hours, or walking 1.5 miles in 30 minutes. Seek medical care for urinary frequency, extreme thirst, vision changes, lightheadedness, dizziness.  Follow up in 3 months with labs week prior

## 2024-06-14 NOTE — Assessment & Plan Note (Signed)
 Continue valsartan  320mg  daily. Reluctant to increase based on home readings when office reading is 117/70 today and 115/50 2 days ago. Report to office if home readings sustain >130/80. Recommend heart healthy diet such as Mediterranean diet with whole grains, fruits, vegetable, fish, lean meats, nuts, and olive oil. Limit salt. Encouraged moderate walking, 3-5 times/week for 30-50 minutes each session. Aim for at least 150 minutes.week. Goal should be pace of 3 miles/hours, or walking 1.5 miles in 30 minutes. Avoid tobacco products. Avoid excess alcohol. Take medications as prescribed and bring medications and blood pressure log with cuff to each office visit. Seek medical care for chest pain, palpitations, shortness of breath with exertion, dizziness/lightheadedness, vision changes, recurrent headaches, or swelling of extremities. Follow up in 3 months

## 2024-06-15 ENCOUNTER — Telehealth: Payer: Self-pay | Admitting: Pharmacy Technician

## 2024-06-15 ENCOUNTER — Other Ambulatory Visit: Payer: Self-pay | Admitting: Family Medicine

## 2024-06-15 ENCOUNTER — Telehealth: Payer: Self-pay

## 2024-06-15 ENCOUNTER — Other Ambulatory Visit (HOSPITAL_COMMUNITY): Payer: Self-pay

## 2024-06-15 DIAGNOSIS — E1169 Type 2 diabetes mellitus with other specified complication: Secondary | ICD-10-CM

## 2024-06-15 DIAGNOSIS — K219 Gastro-esophageal reflux disease without esophagitis: Secondary | ICD-10-CM

## 2024-06-15 NOTE — Telephone Encounter (Signed)
 Copied from CRM [id number]. Topic: Clinical - Medication Question >> Jun 15, 2024 11:41 AM Jayma L wrote: Reason for CRM:  patient said she was seen yesterday and wants to know if she should be taking her insulin  degludec (TRESIBA ) 100 UNIT/ML FlexTouch Pen as 18 units or 10 units like she's used too.   Also asking if she can download the sample of freesyle on her ipad her phone wont support the download, if she can she said she will come in office to grab the sample. Asking for a callback please

## 2024-06-15 NOTE — Telephone Encounter (Signed)
 Pharmacy Patient Advocate Encounter  Received notification from [employer] that Prior Authorization for FreeStyle Libre 14 Day Sensor has been APPROVED from 06/15/24 to 11/29/24. Unable to obtain price due to refill too soon rejection, last fill date 06/15/24 next available fill date 07/06/24   PA #/Case ID/Reference #: [id number]

## 2024-06-15 NOTE — Telephone Encounter (Signed)
 Pharmacy Patient Advocate Encounter   Received notification from Onbase that prior authorization for FreeStyle Libre 14 Day Sensor is required/requested.   Insurance verification completed.   The patient is insured through [employer] .   Per test claim: PA required; PA submitted to above mentioned insurance via CoverMyMeds Key/confirmation #/EOC [employer] Status is pending

## 2024-06-16 ENCOUNTER — Other Ambulatory Visit: Payer: Self-pay

## 2024-06-19 ENCOUNTER — Other Ambulatory Visit: Payer: Self-pay | Admitting: Family Medicine

## 2024-06-19 ENCOUNTER — Telehealth: Payer: Self-pay

## 2024-06-19 DIAGNOSIS — E1169 Type 2 diabetes mellitus with other specified complication: Secondary | ICD-10-CM

## 2024-06-19 NOTE — Telephone Encounter (Signed)
 Copied from CRM [id number]. Topic: Clinical - Medication Question >> Jun 19, 2024  1:58 PM Danielle Hurley wrote: Reason for CRM: glimepiride  (AMARYL ) - patient would like to try the 2mg  tablet- [phone]

## 2024-06-19 NOTE — Telephone Encounter (Unsigned)
 Copied from CRM [id number]. Topic: Clinical - Medication Refill >> Jun 19, 2024  1:57 PM Delon DASEN wrote: Medication: atorvastatin  (LIPITOR) 20 MG tablet  Has the patient contacted their pharmacy? Yes (Agent: If no, request that the patient contact the pharmacy for the refill. If patient does not wish to contact the pharmacy document the reason why and proceed with request.) (Agent: If yes, when and what did the pharmacy advise?)  This is the patient's preferred pharmacy:  [id number] [place], [place] - [address] [place] [address] Phone: [phone] Fax: [phone]  Is this the correct pharmacy for this prescription? Yes If no, delete pharmacy and type the correct one.   Has the prescription been filled recently? Yes  Is the patient out of the medication? No  Has the patient been seen for an appointment in the last year OR does the patient have an upcoming appointment? Yes  Can we respond through MyChart? Yes  Agent: Please be advised that Rx refills may take up to 3 business days. We ask that you follow-up with your pharmacy.

## 2024-06-20 ENCOUNTER — Telehealth: Payer: Self-pay

## 2024-06-20 ENCOUNTER — Other Ambulatory Visit: Payer: Self-pay

## 2024-06-20 DIAGNOSIS — E1169 Type 2 diabetes mellitus with other specified complication: Secondary | ICD-10-CM

## 2024-06-20 DIAGNOSIS — E119 Type 2 diabetes mellitus without complications: Secondary | ICD-10-CM

## 2024-06-20 DIAGNOSIS — E1165 Type 2 diabetes mellitus with hyperglycemia: Secondary | ICD-10-CM

## 2024-06-20 DIAGNOSIS — K219 Gastro-esophageal reflux disease without esophagitis: Secondary | ICD-10-CM

## 2024-06-20 MED ORDER — ATORVASTATIN CALCIUM 20 MG PO TABS: 20.0000 mg | ORAL_TABLET | Freq: Every day | ORAL | 1 refills | Status: DC

## 2024-06-20 MED ORDER — PANTOPRAZOLE SODIUM 20 MG PO TBEC: 20.0000 mg | DELAYED_RELEASE_TABLET | Freq: Every day | ORAL | 1 refills | Status: AC

## 2024-06-20 MED ORDER — INSULIN DEGLUDEC 100 UNIT/ML ~~LOC~~ SOPN: 10.0000 [IU] | PEN_INJECTOR | Freq: Every day | SUBCUTANEOUS | 1 refills | Status: DC

## 2024-06-20 MED ORDER — GLIMEPIRIDE 2 MG PO TABS: 2.0000 mg | ORAL_TABLET | Freq: Two times a day (BID) | ORAL | 1 refills | Status: DC

## 2024-06-20 MED ORDER — FREESTYLE LIBRE 14 DAY READER DEVI: 1 refills | Status: DC

## 2024-06-20 NOTE — Telephone Encounter (Signed)
 Copied from CRM [id number]. Topic: Clinical - Prescription Issue >> Jun 20, 2024  9:53 AM Donna BRAVO wrote: Reason for CRM:  [facility] to Jeoffrey Barrio FNP and not Joesph Sear PA  Pharmacy  continues to mix this up.   Atorvastatin  (LIPITOR) 20 MG tablet   Jeoffrey Barrio FNP need to send prescription in, not Joesph Sear PA.  Pharmacy keeps sending refill request to Joesph Sear PA to fill.  Jeoffrey Barrio FNP sent in prescription for this on 01/07/24 Pharmacy continues to request refill from [facility]   Pantoprazole  (PROTONIX ) 20 MG tablet pharmacy wants to fill the 40MG , as prescribed by Joesph Sear PA.   Jeoffrey Barrio FNP sent in prescription for 20MG , Pharmacy does not want to fill the correct dosage, pharmacy continues to send a refill request to [facility] PA. for refill the 40MG  . Jeoffrey Barrio FNP lowered dosage sent in new prescription on 03/21/24  patient is out of medication and requesting a refill of the 20MG    Continuous Glucose Receiver (FREESTYLE LIBRE 14 DAY READER) [facility] stated this was out of stock on 06/14/24. Pharmacy is stating they need a new prescription for the refill.   Patient requesting refill, patient has enough through the end of the week. insulin   degludec (TRESIBA ) 100 UNIT/ML FlexTouch Pen  Patient requesting refill   Patient would like a call back  phone [phone] or a message sent on MyChart about this issue.

## 2024-06-21 ENCOUNTER — Encounter: Payer: Self-pay | Admitting: Emergency Medicine

## 2024-06-21 NOTE — Telephone Encounter (Signed)
 Rx 06/20/24 #90 1RF- duplicate request Requested Prescriptions  Pending Prescriptions Disp Refills   atorvastatin  (LIPITOR) 20 MG tablet 90 tablet 0    Sig: Take 1 tablet (20 mg total) by mouth daily.     Cardiovascular:  Antilipid - Statins Failed - 06/21/2024  1:58 PM      Failed - Lipid Panel in normal range within the last 12 months    Cholesterol, Total  Date Value Ref Range Status  06/01/2023 133 100 - 199 mg/dL Final   Cholesterol  Date Value Ref Range Status  06/07/2024 115 <200 mg/dL Final   LDL Cholesterol (Calc)  Date Value Ref Range Status  06/07/2024 53 mg/dL (calc) Final    Comment:    Reference range: <100 . Desirable range <100 mg/dL for primary prevention;   <70 mg/dL for patients with CHD or diabetic patients  with > or = 2 CHD risk factors. SABRA LDL-C is now calculated using the Martin-Hopkins  calculation, which is a validated novel method providing  better accuracy than the Friedewald equation in the  estimation of LDL-C.  Gladis APPLETHWAITE et al. SANDREA. 7986;689(80): 2061-2068  (http://education.[employer].com/faq/FAQ164)    HDL  Date Value Ref Range Status  06/07/2024 46 (L) > OR = 50 mg/dL Final  92/97/7975 39 (L) >39 mg/dL Final   Triglycerides  Date Value Ref Range Status  06/07/2024 80 <150 mg/dL Final         Passed - Patient is not pregnant      Passed - Valid encounter within last 12 months    Recent Outpatient Visits           1 week ago Type 2 diabetes mellitus with hyperglycemia, without long-term current use of insulin  (HCC)   [place] [facility] Kayla Jeoffrey RAMAN, FNP   2 months ago Type 2 diabetes mellitus with hyperglycemia, without long-term current use of insulin  [facility])   [place] [facility] Kayla Jeoffrey RAMAN, FNP   2 months ago Type 2 diabetes mellitus with hyperglycemia, without long-term current use of insulin  [facility])   [place] [facility] Kayla Jeoffrey RAMAN, FNP   3 months  ago Physical exam, annual   [place] [facility] Kayla Jeoffrey RAMAN, FNP       Future Appointments             In 2 months Lelon, Glendia DASEN, PA-C [facility] [employer] [facility] [employer]. [facility] [employer], [employer]

## 2024-07-05 ENCOUNTER — Other Ambulatory Visit (HOSPITAL_COMMUNITY): Payer: Self-pay

## 2024-07-05 ENCOUNTER — Telehealth: Payer: Self-pay

## 2024-07-05 ENCOUNTER — Telehealth: Payer: Self-pay | Admitting: Pharmacy Technician

## 2024-07-05 DIAGNOSIS — Z87891 Personal history of nicotine dependence: Secondary | ICD-10-CM

## 2024-07-05 DIAGNOSIS — Z122 Encounter for screening for malignant neoplasm of respiratory organs: Secondary | ICD-10-CM

## 2024-07-05 NOTE — Telephone Encounter (Signed)
 Hi [name], there is a message in Rx Response that was routed to our PA team that Danielle Hurley need a PA can you please sign off on that. Her insurance just paid a claim for Tresiba  on 06/21/24 and the next refill is 08/28/24.  Her insurance prefers she stay on brand name Tresiba  and not the generic that recently came out.    Thanks!!

## 2024-07-05 NOTE — Telephone Encounter (Signed)
 Lung Cancer Screening Narrative/Criteria Questionnaire (Cigarette Smokers Only- No Cigars/Pipes/vapes)   Danielle Hurley      SDMV:07/10/2024 with Josette at 11:30 am         02-06-1961           LDCT: 07/12/2024 at 2:30 pm at GI    63 y.o.   Phone: [phone]  Lung Screening Narrative (confirm age 54-77 yrs Medicare / 50-80 yrs Private pay insurance)   Insurance information: Medicaid/UHC Medicare   Referring Provider: Kayla, NP   This screening involves an initial phone call with a team member from our program. It is called a shared decision making visit. The initial meeting is required by  insurance and Medicare to make sure you understand the program. This appointment takes about 15-20 minutes to complete. You will complete the screening scan at your scheduled date/time.  This scan takes about 5-10 minutes to complete. You can eat and drink normally before and after the scan.  Criteria questions for Lung Cancer Screening:   Are you a current or former smoker? Former Age began smoking: 13   If you are a former smoker, what year did you quit smoking? Quit 11 years ago (within 15 yrs)   To calculate your smoking history, I need an accurate estimate of how many packs of cigarettes you smoked per day and for how many years. (Not just the number of PPD you are now smoking)   Years smoking 39 x Packs per day 1 = Pack years 39   (at least 20 pack yrs)   (Make sure they understand that we need to know how much they have smoked in the past, not just the number of PPD they are smoking now)  Do you have a personal history of cancer?  No    Do you have a family history of cancer? No  Are you coughing up blood?  No  Have you had unexplained weight loss of 15 lbs or more in the last 6 months? No  It looks like you meet all criteria.  When would be a good time for us  to schedule you for this screening?   Additional information: N/A

## 2024-07-06 NOTE — Telephone Encounter (Signed)
 No worries at all, I try my best to stay cleaned up so that I don't miss anything!! Welcome back have a great day!!

## 2024-07-10 ENCOUNTER — Ambulatory Visit (INDEPENDENT_AMBULATORY_CARE_PROVIDER_SITE_OTHER): Admitting: *Deleted

## 2024-07-10 ENCOUNTER — Encounter: Payer: Self-pay | Admitting: *Deleted

## 2024-07-10 DIAGNOSIS — Z87891 Personal history of nicotine dependence: Secondary | ICD-10-CM

## 2024-07-10 NOTE — Patient Instructions (Signed)

## 2024-07-10 NOTE — Progress Notes (Signed)
 Virtual Visit via Telephone Note  I connected with Marli Diego on 07/10/24 at 11:30 AM EDT by telephone and verified that I am speaking with the correct person using two identifiers.  Location: Patient: in home Provider: [address], [place], [place], [address]  Shared Decision Making Visit Lung Cancer Screening Program [phone])   Eligibility: Age 63 y.o. Pack Years Smoking History Calculation 39 (# packs/per year x # years smoked) Recent History of coughing up blood  no Unexplained weight loss? no ( >Than 15 pounds within the last 6 months ) Prior History Lung / other cancer no (Diagnosis within the last 5 years already requiring surveillance chest CT Scans). Smoking Status Former Smoker Former Smokers: Years since quit: 11 years  Quit Date: 2014  Visit Components: Discussion included one or more decision making aids. yes Discussion included risk/benefits of screening. yes Discussion included potential follow up diagnostic testing for abnormal scans. yes Discussion included meaning and risk of over diagnosis. yes Discussion included meaning and risk of False Positives. yes Discussion included meaning of total radiation exposure. yes  Counseling Included: Importance of adherence to annual lung cancer LDCT screening. yes Impact of comorbidities on ability to participate in the program. yes Ability and willingness to under diagnostic treatment. yes  Smoking Cessation Counseling: Current Smokers:  Discussed importance of smoking cessation. yes Information about tobacco cessation classes and interventions provided to patient. yes Patient provided with ticket for LDCT Scan. yes Symptomatic Patient. no  Counseling NA Diagnosis Code: Tobacco Use Z72.0 Asymptomatic Patient yes  Counseling (Intermediate counseling: > three minutes counseling) H9563 3-4- minutes  Former Smokers:  Discussed the importance of maintaining cigarette abstinence. yes Diagnosis Code:  Personal History of Nicotine Dependence. S12.108 Information about tobacco cessation classes and interventions provided to patient. Yes Patient provided with ticket for LDCT Scan. yes Written Order for Lung Cancer Screening with LDCT placed in Epic. Yes (CT Chest Lung Cancer Screening Low Dose W/O CM) [id number] Z12.2-Screening of respiratory organs Z87.891-Personal history of nicotine dependence   Josette Ranger, RN 07/10/24

## 2024-07-11 ENCOUNTER — Ambulatory Visit: Admitting: Cardiovascular Disease

## 2024-07-12 ENCOUNTER — Ambulatory Visit
Admission: RE | Admit: 2024-07-12 | Discharge: 2024-07-12 | Disposition: A | Discharge status: Home / Self Care | Source: Ambulatory Visit | Attending: Family Medicine | Admitting: Family Medicine

## 2024-07-12 DIAGNOSIS — Z87891 Personal history of nicotine dependence: Secondary | ICD-10-CM

## 2024-07-12 DIAGNOSIS — Z122 Encounter for screening for malignant neoplasm of respiratory organs: Secondary | ICD-10-CM

## 2024-07-16 ENCOUNTER — Other Ambulatory Visit: Payer: Self-pay | Admitting: Family Medicine

## 2024-07-16 DIAGNOSIS — E1165 Type 2 diabetes mellitus with hyperglycemia: Secondary | ICD-10-CM

## 2024-07-19 ENCOUNTER — Ambulatory Visit (HOSPITAL_COMMUNITY)
Admission: RE | Admit: 2024-07-19 | Discharge: 2024-07-19 | Disposition: A | Discharge status: Home / Self Care | Source: Ambulatory Visit | Attending: Cardiovascular Disease | Admitting: Cardiovascular Disease

## 2024-07-19 ENCOUNTER — Other Ambulatory Visit: Payer: Self-pay | Admitting: Physician Assistant

## 2024-07-19 DIAGNOSIS — R072 Precordial pain: Secondary | ICD-10-CM | POA: Diagnosis not present

## 2024-07-19 DIAGNOSIS — E1169 Type 2 diabetes mellitus with other specified complication: Secondary | ICD-10-CM

## 2024-07-19 DIAGNOSIS — I498 Other specified cardiac arrhythmias: Secondary | ICD-10-CM

## 2024-07-19 DIAGNOSIS — E1159 Type 2 diabetes mellitus with other circulatory complications: Secondary | ICD-10-CM

## 2024-07-20 LAB — ECHOCARDIOGRAM SQUAT TO STAND
Area-P 1/2: 2.71 cm2
S' Lateral: 2.6 cm

## 2024-07-25 ENCOUNTER — Ambulatory Visit: Payer: Self-pay | Admitting: Physician Assistant

## 2024-07-25 DIAGNOSIS — E1159 Type 2 diabetes mellitus with other circulatory complications: Secondary | ICD-10-CM

## 2024-08-02 ENCOUNTER — Telehealth: Payer: Self-pay

## 2024-08-02 DIAGNOSIS — Z122 Encounter for screening for malignant neoplasm of respiratory organs: Secondary | ICD-10-CM

## 2024-08-02 DIAGNOSIS — Z87891 Personal history of nicotine dependence: Secondary | ICD-10-CM

## 2024-08-02 NOTE — Addendum Note (Signed)
 Addended by: ANITRA AQUAS D on: 08/02/2024 03:53 PM   Modules accepted: Orders

## 2024-08-02 NOTE — Telephone Encounter (Signed)
 Results have been reviewed by Ruthell, NP. She agrees with the radiologist. Please call patient and reivew results. She can be scanned again in one year. Lung mass similar to scan in 2022 was also biopsied in 2022 and was negative for malignant cells. We will continue to monitor annually for growth or changes. Please place annual Lung CT order. Send results and plan to PCP.

## 2024-08-02 NOTE — Telephone Encounter (Signed)
 Result letter sent to pt. Results/ plans faxed to PCP. Order placed for 12 month lung screening CT.

## 2024-08-07 ENCOUNTER — Encounter (HOSPITAL_COMMUNITY): Payer: Self-pay

## 2024-08-07 ENCOUNTER — Ambulatory Visit (HOSPITAL_COMMUNITY): Admitting: Mental Health

## 2024-08-07 DIAGNOSIS — F325 Major depressive disorder, single episode, in full remission: Secondary | ICD-10-CM | POA: Diagnosis not present

## 2024-08-07 DIAGNOSIS — F411 Generalized anxiety disorder: Secondary | ICD-10-CM

## 2024-08-07 DIAGNOSIS — F329 Major depressive disorder, single episode, unspecified: Secondary | ICD-10-CM | POA: Diagnosis not present

## 2024-08-07 NOTE — Progress Notes (Signed)
   THERAPIST PROGRESS NOTE Virtual Visit via Video Note  I connected with Danielle Hurley on 08/07/24 at  1:00 PM EDT by a video enabled telemedicine application and verified that I am speaking with the correct person using two identifiers.  Location: Patient: home address on files Provider: home office   I discussed the limitations of evaluation and management by telemedicine and the availability of in person appointments. The patient expressed understanding and agreed to proceed.  I discussed the assessment and treatment plan with the patient. The patient was provided an opportunity to ask questions and all were answered. The patient agreed with the plan and demonstrated an understanding of the instructions.   The patient was advised to call back or seek an in-person evaluation if the symptoms worsen or if the condition fails to improve as anticipated.  I provided 33 minutes of non-face-to-face time during this encounter.   Ty Bernice Savant, [facility]   Session Time: 1:03 pm  Participation Level: Active  Behavioral Response: CasualAlertEuthymic  Type of Therapy: Individual Therapy  Treatment Goals addressed: STG: Merri will increase management of depressive sxs AEB development of effective coping skills with ability to process thoughts in balanced manner per self report with the next 90 days.   ProgressTowards Goals: Progressing  Interventions: CBT and Supportive  Summary:  Danielle Hurley is a 63 y.o. female who presents with dx  of major depression and generalized anxiety disorder. Danielle Hurley presents for session alert and oriented; mood and affect stable; WNL. Speech clear and coherent at normal rate and tone. Engaged and receptive to interventions. Shares to be doing well but shares ongoing memories of brother's passing. Shares with therapist details of his passing and notes images of his body. Notes feelings of sadness and grief and anger at first responders. Shares to be coping and  finds hit helpful to share with therapist as family does not want to hear her share about brother. Notes working to take care of her physical health with purchase of a walking pad and monitoring diet. Shares thoughts of obtaining driver's license to allow for greater engagement in community. Denies feelings of depression; denies suicidal thoughts.   Suicidal/Homicidal: Nowithout intent/plan  Therapist Response:  Therapist engaged [employer] in [employer]. Completed check in and assessed for current level of functioning sxs management and level of stressors. Provided safe space to share thoughts and feelings in regards to feelings of grief and normalized feelings. Session moved to telephonic after x 15 minutes due to connection difficulties. Supported in processing thoughts of bother and upcoming holidays. Provided support and encouragement; validated feelings. Affirmed working to monitor physical and mental health. Encouraged exploration of engaging in community at higher rate and development of additional social supports. Reviewed session and provided follow up   Plan: Return again in  x4 weeks.  Diagnosis: Major depressive disorder with single episode, remission status unspecified  Generalized anxiety disorder  Collaboration of Care: Other None  Patient/Guardian was advised Release of Information must be obtained prior to any record release in order to collaborate their care with an outside provider. Patient/Guardian was advised if they have not already done so to contact the registration department to sign all necessary forms in order for us  to release information regarding their care.   Consent: Patient/Guardian gives verbal consent for treatment and assignment of benefits for services provided during this visit. Patient/Guardian expressed understanding and agreed to proceed.   Ty Bernice [place], [facility] 08/07/2024

## 2024-08-08 ENCOUNTER — Telehealth (HOSPITAL_COMMUNITY): Payer: Self-pay | Admitting: *Deleted

## 2024-08-08 NOTE — Telephone Encounter (Signed)
 Reaching out to patient to offer assistance regarding upcoming cardiac imaging study; pt verbalizes understanding of appt date/time, parking situation and where to check in, pre-test NPO status and verified current allergies; name and call back number provided for further questions should they arise  Larey Brick RN Navigator Cardiac Imaging [facility] [phone] office [phone] cell  Patient aware to avoid caffeine 12 hours prior to her cardiac PET scan.

## 2024-08-09 ENCOUNTER — Encounter: Payer: Self-pay | Admitting: Physician Assistant

## 2024-08-09 ENCOUNTER — Ambulatory Visit: Payer: Self-pay | Admitting: Physician Assistant

## 2024-08-09 ENCOUNTER — Ambulatory Visit (HOSPITAL_COMMUNITY)
Admission: RE | Admit: 2024-08-09 | Discharge: 2024-08-09 | Disposition: A | Discharge status: Home / Self Care | Source: Ambulatory Visit | Attending: Physician Assistant | Admitting: Physician Assistant

## 2024-08-09 DIAGNOSIS — R072 Precordial pain: Secondary | ICD-10-CM | POA: Insufficient documentation

## 2024-08-09 LAB — NM PET CT CARDIAC PERFUSION MULTI W/ABSOLUTE BLOODFLOW
LV dias vol: 61 mL (ref 46–106)
LV sys vol: 18 mL (ref 3.8–5.2)
MBFR: 2.68
Nuc Rest EF: 70 %
Nuc Stress EF: 75 %
Rest MBF: 1.21 ml/g/min
Rest Nuclear Isotope Dose: 21.6 mCi
ST Depression (mm): 0 mm
Stress MBF: 3.24 ml/g/min
Stress Nuclear Isotope Dose: 21.5 mCi

## 2024-08-09 MED ORDER — RUBIDIUM RB82 GENERATOR (RUBYFILL)
21.5000 | PACK | Freq: Once | INTRAVENOUS | Status: AC
Start: 2024-08-09 — End: 2024-08-09
  Administered 2024-08-09: 21.5 via INTRAVENOUS

## 2024-08-09 MED ORDER — RUBIDIUM RB82 GENERATOR (RUBYFILL)
21.5900 | PACK | Freq: Once | INTRAVENOUS | Status: AC
Start: 2024-08-09 — End: 2024-08-09
  Administered 2024-08-09: 21.59 via INTRAVENOUS

## 2024-08-09 MED ORDER — REGADENOSON 0.4 MG/5ML IV SOLN
INTRAVENOUS | Status: AC
  Filled 2024-08-09: qty 5

## 2024-08-09 MED ORDER — REGADENOSON 0.4 MG/5ML IV SOLN
0.4000 mg | Freq: Once | INTRAVENOUS | Status: AC
Start: 2024-08-09 — End: 2024-08-09
  Administered 2024-08-09: 0.4 mg via INTRAVENOUS

## 2024-08-16 ENCOUNTER — Encounter: Payer: Self-pay | Admitting: Podiatry

## 2024-08-16 ENCOUNTER — Ambulatory Visit (INDEPENDENT_AMBULATORY_CARE_PROVIDER_SITE_OTHER): Admitting: Podiatry

## 2024-08-16 DIAGNOSIS — M79675 Pain in left toe(s): Secondary | ICD-10-CM | POA: Diagnosis not present

## 2024-08-16 DIAGNOSIS — L84 Corns and callosities: Secondary | ICD-10-CM

## 2024-08-16 DIAGNOSIS — M79674 Pain in right toe(s): Secondary | ICD-10-CM | POA: Diagnosis not present

## 2024-08-16 DIAGNOSIS — B351 Tinea unguium: Secondary | ICD-10-CM

## 2024-08-16 DIAGNOSIS — E1165 Type 2 diabetes mellitus with hyperglycemia: Secondary | ICD-10-CM | POA: Diagnosis not present

## 2024-08-16 NOTE — Progress Notes (Signed)
  Subjective:  Patient ID: Danielle Hurley, female    DOB: 11-Dec-1960,   MRN: [id number]  Chief Complaint  Patient presents with   Diabetes    I need my nails clipped and the calluses.  I have this place on my left big toe.  My doctor gave me this  Ciclopirox  to put on it.  Saw Jeoffrey Barrio, FNP - 06/14/2024; A1c - 7.2    63 y.o. female presents for concern of thickened elongated and painful nails that are difficult to trim. Requesting to have them trimmed today. Relates burning and tingling in their feet. Patient is diabetic and last A1c was  Lab Results  Component Value Date   HGBA1C 7.2 (H) 06/07/2024   .   PCP:  Barrio Jeoffrey RAMAN, FNP    . Denies any other pedal complaints. Denies n/v/f/c.   Past Medical History:  Diagnosis Date   Arthritis    COPD (chronic obstructive pulmonary disease) (HCC)    Diabetes mellitus without complication (HCC)    Dyspnea    GERD (gastroesophageal reflux disease)    Hepatitis C    treated 2022   High cholesterol    History of kidney stones    Hypertension    Precordial chest pain 08/09/2024   PET MPI 08/09/24: no ischemia or infarction; EF 70; MBFR 2.68 (normal); no CAC on CT; Low Risk     Tuberculosis    non contagious Dx 05/2020    Objective:  Physical Exam: Vascular: DP/PT pulses 2/4 bilateral. CFT <3 seconds. Absent hair growth on digits. Edema noted to bilateral lower extremities. Xerosis noted bilaterally.  Skin. No lacerations or abrasions bilateral feet. Nails 1-5 bilateral  are thickened discolored and elongated with subungual debris. Hyperkeratotic lesion noted bilateral plantar fifth metatarsal heads.   Musculoskeletal: MMT 5/5 bilateral lower extremities in DF, PF, Inversion and Eversion. Deceased ROM in DF of ankle joint.  Neurological: Sensation intact to light touch. Protective sensation diminished bilateral.    Assessment:   1. Pain due to onychomycosis of toenails of both feet   2. Callus of foot   3. Type 2 diabetes  mellitus with hyperglycemia, without long-term current use of insulin  (HCC)      Plan:  Patient was evaluated and treated and all questions answered. -Discussed and educated patient on diabetic foot care, especially with  regards to the vascular, neurological and musculoskeletal systems.  -Stressed the importance of good glycemic control and the detriment of not  controlling glucose levels in relation to the foot. -Discussed supportive shoes at all times and checking feet regularly.  -Mechanically debrided all nails 1-5 bilateral using sterile nail nipper and filed with dremel without incident  -Answered all patient questions -Patient to return  in 3 months for at risk foot care -Patient advised to call the office if any problems or questions arise in the meantime.   Asberry Failing, DPM

## 2024-09-06 ENCOUNTER — Other Ambulatory Visit

## 2024-09-06 ENCOUNTER — Ambulatory Visit (INDEPENDENT_AMBULATORY_CARE_PROVIDER_SITE_OTHER)

## 2024-09-06 DIAGNOSIS — E119 Type 2 diabetes mellitus without complications: Secondary | ICD-10-CM

## 2024-09-06 DIAGNOSIS — I152 Hypertension secondary to endocrine disorders: Secondary | ICD-10-CM

## 2024-09-06 DIAGNOSIS — E1169 Type 2 diabetes mellitus with other specified complication: Secondary | ICD-10-CM

## 2024-09-06 DIAGNOSIS — Z23 Encounter for immunization: Secondary | ICD-10-CM | POA: Diagnosis not present

## 2024-09-06 DIAGNOSIS — E1165 Type 2 diabetes mellitus with hyperglycemia: Secondary | ICD-10-CM

## 2024-09-06 NOTE — Progress Notes (Signed)
 Patient is in office today for a nurse visit for Immunization. Patient Injection was given in the  Left deltoid. Patient tolerated injection well.

## 2024-09-11 ENCOUNTER — Other Ambulatory Visit: Payer: Self-pay | Admitting: Family Medicine

## 2024-09-11 ENCOUNTER — Ambulatory Visit: Payer: Self-pay | Admitting: Family Medicine

## 2024-09-11 DIAGNOSIS — E1165 Type 2 diabetes mellitus with hyperglycemia: Secondary | ICD-10-CM

## 2024-09-11 DIAGNOSIS — E119 Type 2 diabetes mellitus without complications: Secondary | ICD-10-CM

## 2024-09-11 MED ORDER — FREESTYLE LIBRE 3 PLUS SENSOR MISC: 3 refills | Status: DC

## 2024-09-12 LAB — CBC WITH DIFFERENTIAL/PLATELET
Absolute Lymphocytes: 1845 {cells}/uL (ref 850–3900)
Absolute Monocytes: 360 {cells}/uL (ref 200–950)
Basophils Absolute: 53 {cells}/uL (ref 0–200)
Basophils Relative: 0.7 %
Eosinophils Absolute: 240 {cells}/uL (ref 15–500)
Eosinophils Relative: 3.2 %
HCT: 38.1 % (ref 35.0–45.0)
Hemoglobin: 12.9 g/dL (ref 11.7–15.5)
MCH: 30.6 pg (ref 27.0–33.0)
MCHC: 33.9 g/dL (ref 32.0–36.0)
MCV: 90.5 fL (ref 80.0–100.0)
MPV: 11.1 fL (ref 7.5–12.5)
Monocytes Relative: 4.8 %
Neutro Abs: 5003 {cells}/uL (ref 1500–7800)
Neutrophils Relative %: 66.7 %
Platelets: 196 Thousand/uL (ref 140–400)
RBC: 4.21 Million/uL (ref 3.80–5.10)
RDW: 13.2 % (ref 11.0–15.0)
Total Lymphocyte: 24.6 %
WBC: 7.5 Thousand/uL (ref 3.8–10.8)

## 2024-09-12 LAB — VITAMIN D 25 HYDROXY (VIT D DEFICIENCY, FRACTURES): Vit D, 25-Hydroxy: 48 ng/mL (ref 30–100)

## 2024-09-12 LAB — TEST AUTHORIZATION

## 2024-09-12 LAB — COMPREHENSIVE METABOLIC PANEL WITH GFR
AG Ratio: 1.2 (calc) (ref 1.0–2.5)
ALT: 11 U/L (ref 6–29)
AST: 18 U/L (ref 10–35)
Albumin: 4.1 g/dL (ref 3.6–5.1)
Alkaline phosphatase (APISO): 79 U/L (ref 37–153)
BUN/Creatinine Ratio: 24 (calc) — ABNORMAL HIGH (ref 6–22)
BUN: 27 mg/dL — ABNORMAL HIGH (ref 7–25)
CO2: 27 mmol/L (ref 20–32)
Calcium: 9.6 mg/dL (ref 8.6–10.4)
Chloride: 109 mmol/L (ref 98–110)
Creat: 1.11 mg/dL — ABNORMAL HIGH (ref 0.50–1.05)
Globulin: 3.3 g/dL (ref 1.9–3.7)
Glucose, Bld: 143 mg/dL — ABNORMAL HIGH (ref 65–99)
Potassium: 3.8 mmol/L (ref 3.5–5.3)
Sodium: 143 mmol/L (ref 135–146)
Total Bilirubin: 0.6 mg/dL (ref 0.2–1.2)
Total Protein: 7.4 g/dL (ref 6.1–8.1)
eGFR: 55 mL/min/1.73m2 — ABNORMAL LOW (ref >=60)

## 2024-09-12 LAB — LIPID PANEL
Cholesterol: 110 mg/dL (ref <200)
HDL: 43 mg/dL — ABNORMAL LOW (ref >=50)
LDL Cholesterol (Calc): 48 mg/dL
Non-HDL Cholesterol (Calc): 67 mg/dL (ref <130)
Total CHOL/HDL Ratio: 2.6 (calc) (ref <5.0)
Triglycerides: 103 mg/dL (ref <150)

## 2024-09-12 LAB — HEMOGLOBIN A1C
Hgb A1c MFr Bld: 5.8 % — ABNORMAL HIGH (ref <5.7)
Mean Plasma Glucose: 120 mg/dL
eAG (mmol/L): 6.6 mmol/L

## 2024-09-12 NOTE — Progress Notes (Unsigned)
 Cardiology Office Note:  .   Date:  09/13/2024  ID:  Danielle Hurley, DOB 19-Jan-1961, MRN [id number] PCP: Kayla Jeoffrey RAMAN, FNP  [facility] Providers Cardiologist:  None {  History of Present Illness: .   Danielle Hurley is a 63 y.o. female with history of hypertension, hyperlipidemia, diabetes, CKD, hepatitis C, latent TB infection.     Chest pain Reason for referral.  Reported radiating nonexertional chest pain accompanied with shortness of breath and sweating. Echo 06/2024 with hyperdynamic EF NM PET normal 07/2024.  Normal myocardial flow reserve  Social history  Prior smoker.  Quit in 2014 after 40 years of smoking a pack per day. No known family history adopted Moved from [place]  in October 2021     Patient recently established care with cardiology for complaints of nonexertional chest pain with accompanying symptoms of shortness of breath and sweating.  Echo and stress test were normal.  Today she is here for follow-up to discuss these results.  She reports that she continues to have these chest pains that are tight and continued to be nonexertional but they occur infrequently and are not of any significant concerns for her.  Denies pain being pleuritic.  She has other complaints of numbness and tingling in her arms and her legs but does state that she does have neuropathy.  She is making better attempts to try to exercise and walking 10 to 20 minutes on a walking pad at home.  Denies any exertional symptoms.  Otherwise today she has no further complaints.  ROS: Denies: shortness of breath, orthopnea, peripheral edema, palpitations, decreased exercise intolerance, fatigue, lightheadedness.   Studies Reviewed: .         Risk Assessment/Calculations:        Physical Exam:   VS:  BP (!) 152/76   Pulse 83   Ht 5' 10 (1.778 m)   Wt 174 lb 9.6 oz (79.2 kg)   SpO2 97%   BMI 25.05 kg/m    Wt Readings from Last 3 Encounters:  09/13/24 174 lb 9.6 oz (79.2 kg)   06/14/24 182 lb 6.4 oz (82.7 kg)  06/12/24 180 lb 3.2 oz (81.7 kg)    GEN: Well nourished, well developed in no acute distress NECK: No JVD; No carotid bruits CARDIAC: RRR, no murmurs, rubs, gallops RESPIRATORY:  Clear to auscultation without rales, wheezing or rhonchi  ABDOMEN: Soft, non-tender, non-distended EXTREMITIES:  No edema; No deformity   ASSESSMENT AND PLAN: .    Chest pain Reassuring echocardiogram and NM PET stress test with normal myocardial flow reserves 07/2024.  No indication for further testing at this point.   Small pericardial effusion Incidentally noted on echocardiogram.  Low suspicion for pericarditis as she has no ST-T wave changes or PR depressions or classic symptoms that would support diagnosis.  Echo also did not show LV gradient likely related to hyperdynamic EF 70-75%.   Repeat limited echocardiogram in 3 months to ensure no progression of effusion.  Hypertension Blood pressure repeated is 140/70.  She reports that she has a wrist cuff at and has been logging her blood pressure but does not have this log with her today.   Will defer to PCP about blood pressure management as she will be seeing her next week and will have her blood pressure log to review.   Continue valsartan  320 mg daily  Hyperlipidemia Continue atorvastatin  20 mg daily.  05/2024 LDL 53.  Type 2 diabetes Decently controlled.  A1c 7.2% 05/2024. Requires insulin .  Defer to PCP about SGLT2 inhibitor.  May benefit given underlying CKD as well.  Prior smoker Nodules noted on lung cancer screening 06/2024.  She gets this monitored annually.  Dispo: Follow-up as needed as long as echocardiogram does not show progression of the effusion.  Signed, Thom LITTIE Sluder, PA-C

## 2024-09-13 ENCOUNTER — Ambulatory Visit: Admitting: Family Medicine

## 2024-09-13 ENCOUNTER — Ambulatory Visit: Attending: Cardiovascular Disease | Admitting: Cardiology

## 2024-09-13 ENCOUNTER — Encounter: Payer: Self-pay | Admitting: Physician Assistant

## 2024-09-13 ENCOUNTER — Telehealth: Payer: Self-pay

## 2024-09-13 VITALS — BP 152/76 | HR 83 | Ht 70.0 in | Wt 174.6 lb

## 2024-09-13 DIAGNOSIS — I152 Hypertension secondary to endocrine disorders: Secondary | ICD-10-CM | POA: Diagnosis not present

## 2024-09-13 DIAGNOSIS — I3139 Other pericardial effusion (noninflammatory): Secondary | ICD-10-CM | POA: Diagnosis not present

## 2024-09-13 DIAGNOSIS — E1159 Type 2 diabetes mellitus with other circulatory complications: Secondary | ICD-10-CM

## 2024-09-13 DIAGNOSIS — E785 Hyperlipidemia, unspecified: Secondary | ICD-10-CM | POA: Diagnosis not present

## 2024-09-13 DIAGNOSIS — E1169 Type 2 diabetes mellitus with other specified complication: Secondary | ICD-10-CM

## 2024-09-13 DIAGNOSIS — R072 Precordial pain: Secondary | ICD-10-CM

## 2024-09-13 NOTE — Telephone Encounter (Signed)
 Copied from CRM #[id number]. Topic: Clinical - Medication Question >> Sep 13, 2024 10:32 AM Tonda B wrote: Reason for CRM: patient is calling asking for a reader for her rx  Continuous Glucose Sensor (FREESTYLE LIBRE 3 please call pt [phone] (M) [phone]

## 2024-09-13 NOTE — Patient Instructions (Addendum)
 Medication Instructions:  Your physician recommends that you continue on your current medications as directed. Please refer to the Current Medication list given to you today.  *If you need a refill on your cardiac medications before your next appointment, please call your pharmacy*  Lab Work: None ordered If you have labs (blood work) drawn today and your tests are completely normal, you will receive your results only by: MyChart Message (if you have MyChart) OR A paper copy in the mail If you have any lab test that is abnormal or we need to change your treatment, we will call you to review the results.  Testing/Procedures Your physician has requested that you have an echocardiogram in January. Echocardiography is a painless test that uses sound waves to create images of your heart. It provides your doctor with information about the size and shape of your heart and how well your heart's chambers and valves are working. This procedure takes approximately one hour. There are no restrictions for this procedure. Please do NOT wear cologne, perfume, aftershave, or lotions (deodorant is allowed). Please arrive 15 minutes prior to your appointment time.  Please note: We ask at that you not bring children with you during ultrasound (echo/ vascular) testing. Due to room size and safety concerns, children are not allowed in the ultrasound rooms during exams. Our front office staff cannot provide observation of children in our lobby area while testing is being conducted. An adult accompanying a patient to their appointment will only be allowed in the ultrasound room at the discretion of the ultrasound technician under special circumstances. We apologize for any inconvenience.   Follow-Up: As needed

## 2024-09-14 ENCOUNTER — Other Ambulatory Visit: Payer: Self-pay | Admitting: Family Medicine

## 2024-09-14 MED ORDER — FREESTYLE LIBRE 3 READER DEVI: 1 refills | Status: DC

## 2024-09-18 ENCOUNTER — Ambulatory Visit (INDEPENDENT_AMBULATORY_CARE_PROVIDER_SITE_OTHER): Admitting: Mental Health

## 2024-09-18 DIAGNOSIS — F329 Major depressive disorder, single episode, unspecified: Secondary | ICD-10-CM | POA: Diagnosis not present

## 2024-09-18 DIAGNOSIS — F411 Generalized anxiety disorder: Secondary | ICD-10-CM | POA: Diagnosis not present

## 2024-09-18 NOTE — Progress Notes (Signed)
   THERAPIST PROGRESS NOTE Virtual Visit via Video Note  I connected with Clarita Constant on 09/18/24 at  3:00 PM EDT by a video enabled telemedicine application and verified that I am speaking with the correct person using two identifiers.  Location: Patient: home address on file Provider: remote office   I discussed the limitations of evaluation and management by telemedicine and the availability of in person appointments. The patient expressed understanding and agreed to proceed.  I discussed the assessment and treatment plan with the patient. The patient was provided an opportunity to ask questions and all were answered. The patient agreed with the plan and demonstrated an understanding of the instructions.   The patient was advised to call back or seek an in-person evaluation if the symptoms worsen or if the condition fails to improve as anticipated.  I provided 33 minutes of non-face-to-face time during this encounter.   Ty Bernice Savant, [facility]   Session Time: 3:03 pm ( 33 minutes)   Participation Level: Active  Behavioral Response: CasualAlertEuthymic  Type of Therapy: Individual Therapy  Treatment Goals addressed: STG: Tashanna will increase management of depressive sxs AEB development of effective coping skills with ability to process thoughts in balanced manner per self report with the next 90 days.   ProgressTowards Goals: Progressing  Interventions: Supportive  Summary: Dhani Imel is a 63 y.o. female who presents with dx  of major depression and generalized anxiety disorder. Reilynn presents for session alert and oriented; mood and affect stable; WNL. Speech clear and coherent at normal rate and tone. Engaged and receptive to interventions. Shares to be doing well with chief complaint of  I am happy. Shares to be getting out and socialization with family and denies isolative behaviors. Shares working to manage medical with with diabetes dx. Shares going for walks on  walking pad and enjoys reading and her movie collection. Shares to have had to put dog to sleep with has been sad with ongoing moments of missing her brother. Shares able to process feelings and talks to brother as means of coping with is supportive for her. Notes for moods to be stable and engages in activities throughout day to support in [place] [facility]. Denies safety concerns. Progress with goals with able to identify coping skills, reading, talking to brother, playing games on phone and movies.   Suicidal/Homicidal: Nowithout intent/plan  Therapist Response: Therapist engaged [employer] in [employer]. Completed check in and assessed for current level of functioning sxs management and level of stressors. Provided safe space to share thoughts and feelings. Explored factors supporting in maintaining mental health stability. Explored activity throughout the day and ensuring no isolation from others. Supported and normalized feelings of grief and provided encouragement. Reviewed session and provided follow up.  Plan: Return again in  x 6 weeks.  Diagnosis: Major depressive disorder with single episode, remission status unspecified  Generalized anxiety disorder  Collaboration of Care: Other None  Patient/Guardian was advised Release of Information must be obtained prior to any record release in order to collaborate their care with an outside provider. Patient/Guardian was advised if they have not already done so to contact the registration department to sign all necessary forms in order for us  to release information regarding their care.   Consent: Patient/Guardian gives verbal consent for treatment and assignment of benefits for services provided during this visit. Patient/Guardian expressed understanding and agreed to proceed.   Ty Bernice [place], [facility] 09/18/2024

## 2024-09-20 ENCOUNTER — Ambulatory Visit: Admitting: Family Medicine

## 2024-09-20 ENCOUNTER — Encounter: Payer: Self-pay | Admitting: Family Medicine

## 2024-09-20 VITALS — BP 132/82 | HR 84 | Temp 98.5°F | Ht 70.0 in | Wt 175.8 lb

## 2024-09-20 DIAGNOSIS — E1159 Type 2 diabetes mellitus with other circulatory complications: Secondary | ICD-10-CM

## 2024-09-20 DIAGNOSIS — I152 Hypertension secondary to endocrine disorders: Secondary | ICD-10-CM | POA: Diagnosis not present

## 2024-09-20 DIAGNOSIS — M62838 Other muscle spasm: Secondary | ICD-10-CM

## 2024-09-20 DIAGNOSIS — E785 Hyperlipidemia, unspecified: Secondary | ICD-10-CM | POA: Diagnosis not present

## 2024-09-20 DIAGNOSIS — E119 Type 2 diabetes mellitus without complications: Secondary | ICD-10-CM

## 2024-09-20 DIAGNOSIS — E1169 Type 2 diabetes mellitus with other specified complication: Secondary | ICD-10-CM

## 2024-09-20 DIAGNOSIS — K219 Gastro-esophageal reflux disease without esophagitis: Secondary | ICD-10-CM | POA: Diagnosis not present

## 2024-09-20 DIAGNOSIS — Z794 Long term (current) use of insulin: Secondary | ICD-10-CM

## 2024-09-20 DIAGNOSIS — D171 Benign lipomatous neoplasm of skin and subcutaneous tissue of trunk: Secondary | ICD-10-CM

## 2024-09-20 MED ORDER — AMLODIPINE BESYLATE 5 MG PO TABS: 5.0000 mg | ORAL_TABLET | Freq: Every day | ORAL | 0 refills | Status: DC

## 2024-09-20 MED ORDER — CYCLOBENZAPRINE HCL 5 MG PO TABS: 5.0000 mg | ORAL_TABLET | Freq: Every day | ORAL | 0 refills | Status: DC | PRN

## 2024-09-20 NOTE — Assessment & Plan Note (Signed)
 Deep 1-2cm nodule to left lower back. Would prefer not to go outside of office for treatment, will refer to my supervising physician for evaluation and removal if appropriate.

## 2024-09-20 NOTE — Assessment & Plan Note (Signed)
 Well controlled on protonix. Anti-reflux measures such as raising the head of the bed, avoiding tight clothing or belts, avoiding eating late at night and not lying down shortly after mealtime and achieving weight loss are discussed. Avoid ASA, NSAID's, caffeine, peppermints, alcohol and tobacco. Alert me if there are persistent symptoms, dysphagia, weight loss or GI bleeding.

## 2024-09-20 NOTE — Assessment & Plan Note (Signed)
 Continue Trulicity  3mg  weekly. Continue Tresiba  10 units daily. Continue amaryl  2mg  BID. Start CGM, request to link sent. Notify office if BG consistently drops <70. SABRARecommend heart healthy diet such as Mediterranean diet with whole grains, fruits, vegetable, fish, lean meats, nuts, and olive oil. Limit salt. Encouraged moderate walking, 3-5 times/week for 30-50 minutes each session. Aim for at least 150 minutes.week. Goal should be pace of 3 miles/hours, or walking 1.5 miles in 30 minutes. Seek medical care for urinary frequency, extreme thirst, vision changes, lightheadedness, dizziness.  Follow up in 3 months or sooner if needed

## 2024-09-20 NOTE — Progress Notes (Signed)
 Subjective:  HPI: Danielle Hurley is a 63 y.o. female presenting on 09/20/2024 for Medical Management of Chronic Issues (3 month DM f/u /Cyst on back that is causing pain would like referral for removal )   HPI Patient is in today for chronic condition management. PMH includes arthritis, COPD, HLD, HTN, DM2, GERD, Hepatitis C, and TB.   HLD: on atorvastatin  20mg  daily Lipid Panel     Component Value Date/Time   CHOL 110 09/06/2024 0959   CHOL 133 06/01/2023 0902   TRIG 103 09/06/2024 0959   HDL 43 (L) 09/06/2024 0959   HDL 39 (L) 06/01/2023 0902   CHOLHDL 2.6 09/06/2024 0959   LDLCALC 48 09/06/2024 0959   LABVLDL 27 06/01/2023 0902  The ASCVD Risk score (Arnett DK, et al., 2019) failed to calculate for the following reasons:   The valid total cholesterol range is 130 to 320 mg/dL  HTN: on valsartan  320mg  daily, home readings 123/76-153/85 Denies chest pain, palpitations, recurrent headaches, vision changes, lightheadedness, dizziness, dyspnea on exertion, or swelling of extremities.  DM2: on amaryl  2mg  BID, tresiba  10 units daily, Trulicity  3mg  weekly, using CGM Denies polyuria, polydypsia, chest pain, paresthesias Lab Results  Component Value Date   HGBA1C 5.8 (H) 09/06/2024   HGBA1C 7.2 (H) 06/07/2024   HGBA1C 11.0 (H) 03/07/2024   GERD: on pantopraole 20mg  daily Denies hematemesis, melena, hematochezia, occult blood in stool, iron deficiency anemia, anorexia, unexplained weight loss, dysphagia, odynophagia, persistent vomiting  CKD3a. Stable, pushing fluids, avoiding NSAIDs Lab Results  Component Value Date   CREATININE 1.11 (H) 09/06/2024   CREATININE 1.23 (H) 06/07/2024   CREATININE 1.16 (H) 03/28/2024   Discussed the use of AI scribe software for clinical note transcription with the patient, who gave verbal consent to proceed.  History of Present Illness Danielle Hurley is a 63 year old female with diabetes and hypertension who presents with concerns about blood  sugar management and blood pressure control.  She experiences nocturnal hypoglycemia, particularly when taking glimepiride  before dinner, with blood sugar levels dropping to 64 mg/dL around midnight or 2 AM, causing her to wake up feeling shaky. She takes Amaryl  (glimepiride ) 2 mg twice daily, once in the morning and once at night, and Tresiba  10 units daily in the morning. She also uses Trulicity  3 mg weekly without adverse effects. She recently started using a continuous glucose monitor but has not yet tried it in the shower due to concerns about it coming off.  Her blood pressure readings have been slightly elevated, with a recent reading of 132/82 mmHg. She currently takes valsartan  320 mg daily, with blood pressure readings varying from 127/83 mmHg to 154/88 mmHg. She has not yet started amlodipine, which was suggested by her cardiologist.  She takes atorvastatin  daily for cholesterol management and reports good cholesterol levels. She also takes Protonix  20 mg daily for acid reflux and has started a fiber supplement to address constipation, which she describes as 'rabbit poops.' She occasionally uses a stool softener.  No chest pain, palpitations, headaches, vision changes, lightheadedness, dizziness, urinary frequency, excessive thirst, blood in stool, or vomiting blood.  She has a cyst on her back that causes pain and shifts position she would like removed. She denies redness, warmth, or purulent drainage.      Review of Systems  All other systems reviewed and are negative.   Relevant past medical history reviewed and updated as indicated.   Past Medical History:  Diagnosis Date   Arthritis  COPD (chronic obstructive pulmonary disease) (HCC)    Diabetes mellitus without complication (HCC) 1999   Dyspnea    GERD (gastroesophageal reflux disease)    Hepatitis C    treated 2022   High cholesterol    History of kidney stones    Hypertension 1999   Precordial chest pain  08/09/2024   PET MPI 08/09/24: no ischemia or infarction; EF 70; MBFR 2.68 (normal); no CAC on CT; Low Risk     Tuberculosis    non contagious Dx 05/2020     Past Surgical History:  Procedure Laterality Date   CHOLECYSTECTOMY  1998   COLONOSCOPY  03/22/2023   first colonoscopy   TUBAL LIGATION  01/1995   VIDEO BRONCHOSCOPY WITH ENDOBRONCHIAL NAVIGATION N/A 09/04/2021   Procedure: VIDEO BRONCHOSCOPY WITH ENDOBRONCHIAL NAVIGATION;  Surgeon: Kerrin Elspeth BROCKS, MD;  Location: [facility] OR;  Service: Thoracic;  Laterality: N/A;    Allergies and medications reviewed and updated.   Current Outpatient Medications:    Accu-Chek Softclix Lancets lancets, USE 1  TO CHECK GLUCOSE IN THE MORNING AND 1 AT NOON AND 1 AT BEDTIME, Disp: 100 each, Rfl: 0   amLODipine (NORVASC) 5 MG tablet, Take 1 tablet (5 mg total) by mouth daily., Disp: 90 tablet, Rfl: 0   aspirin-acetaminophen-caffeine (EXCEDRIN MIGRAINE) 250-250-65 MG tablet, Take 1 tablet by mouth every 6 (six) hours as needed for headache., Disp: , Rfl:    atorvastatin  (LIPITOR) 20 MG tablet, Take 1 tablet (20 mg total) by mouth daily., Disp: 90 tablet, Rfl: 1   Blood Glucose Monitoring Suppl DEVI, 1 each by Does not apply route in the morning, at noon, and at bedtime. May substitute to any manufacturer covered by patient's insurance., Disp: 1 each, Rfl: 0   cetirizine (ZYRTEC) 10 MG tablet, Take 10 mg by mouth daily as needed for allergies., Disp: , Rfl:    Cholecalciferol (VITAMIN D ) 50 MCG (2000 UT) tablet, Take 2,000 Units by mouth daily., Disp: , Rfl:    ciclopirox  (PENLAC ) 8 % solution, Apply topically at bedtime. Apply over nail and surrounding skin. Apply daily over previous coat. After seven (7) days, may remove with alcohol and continue cycle., Disp: 6.6 mL, Rfl: 0   Continuous Glucose Receiver (FREESTYLE LIBRE 3 READER) DEVI, Libre 3 reader, Disp: 1 each, Rfl: 1   Continuous Glucose Sensor (FREESTYLE LIBRE 3 PLUS SENSOR) MISC, Change sensor  every 15 days., Disp: 2 each, Rfl: 3   Dulaglutide  (TRULICITY ) 3 MG/0.5ML SOAJ, INJECT 3 MG INTO THE SKIN ONE TIME PER WEEK, Disp: 6 mL, Rfl: 1   glimepiride  (AMARYL ) 2 MG tablet, Take 1 tablet (2 mg total) by mouth in the morning and at bedtime., Disp: 180 tablet, Rfl: 1   glucose blood (TRUETRACK TEST) test strip, Bloodsugar testing QD and prn, Disp: 100 each, Rfl: 12   insulin  degludec (TRESIBA ) 100 UNIT/ML FlexTouch Pen, Inject 10 Units into the skin daily., Disp: 3 mL, Rfl: 1   Insulin  Degludec FlexTouch 100 UNIT/ML SOPN, INJECT 10 UNITS INTO THE SKIN DAILY, Disp: 100 mL, Rfl: 1   Insulin  Pen Needle (COMFORT TOUCH INSULIN  PEN NEED) 32G X 6 MM MISC, 1 Needle by Does not apply route daily., Disp: 100 each, Rfl: 3   pantoprazole  (PROTONIX ) 20 MG tablet, Take 1 tablet (20 mg total) by mouth daily., Disp: 90 tablet, Rfl: 1   valsartan  (DIOVAN ) 320 MG tablet, Take 1 tablet (320 mg total) by mouth daily., Disp: 90 tablet, Rfl: 3   cyclobenzaprine  (FLEXERIL )  5 MG tablet, Take 1 tablet (5 mg total) by mouth daily as needed for muscle spasms. for muscle spams, Disp: 10 tablet, Rfl: 0  No Known Allergies  Objective:   BP 132/82   Pulse 84   Temp 98.5 F (36.9 C)   Ht 5' 10 (1.778 m)   Wt 175 lb 12.8 oz (79.7 kg)   SpO2 98%   BMI 25.22 kg/m      09/20/2024    2:16 PM 09/13/2024    1:40 PM 08/09/2024   10:13 AM  Vitals with BMI  Height 5' 10 5' 10   Weight 175 lbs 13 oz 174 lbs 10 oz   BMI 25.22 25.05   Systolic 132 152 854  Diastolic 82 76 44  Pulse 84 83      Physical Exam Vitals and nursing note reviewed.  Constitutional:      Appearance: Normal appearance. She is normal weight.  HENT:     Head: Normocephalic and atraumatic.  Cardiovascular:     Rate and Rhythm: Normal rate and regular rhythm.     Pulses: Normal pulses.     Heart sounds: Normal heart sounds.  Pulmonary:     Effort: Pulmonary effort is normal.     Breath sounds: Normal breath sounds.  Skin:     General: Skin is warm and dry.         Comments: Soft, mobile mass approx 1-2cm in subdermal  Neurological:     General: No focal deficit present.     Mental Status: She is alert and oriented to person, place, and time. Mental status is at baseline.  Psychiatric:        Mood and Affect: Mood normal.        Behavior: Behavior normal.        Thought Content: Thought content normal.        Judgment: Judgment normal.     Assessment & Plan:  Lipoma of torso Assessment & Plan: Deep 1-2cm nodule to left lower back. Would prefer not to go outside of office for treatment, will refer to my supervising physician for evaluation and removal if appropriate.    Neck muscle spasm -     Cyclobenzaprine  HCl; Take 1 tablet (5 mg total) by mouth daily as needed for muscle spasms. for muscle spams  Dispense: 10 tablet; Refill: 0  Hypertension associated with diabetes (HCC) Assessment & Plan: Continue valsartan  320mg  daily, start amlodipine 5mg  daily as recommended by cardiology. Recommend heart healthy diet such as Mediterranean diet with whole grains, fruits, vegetable, fish, lean meats, nuts, and olive oil. Limit salt. Encouraged moderate walking, 3-5 times/week for 30-50 minutes each session. Aim for at least 150 minutes.week. Goal should be pace of 3 miles/hours, or walking 1.5 miles in 30 minutes. Avoid tobacco products. Avoid excess alcohol. Take medications as prescribed and bring medications and blood pressure log with cuff to each office visit. Seek medical care for chest pain, palpitations, shortness of breath with exertion, dizziness/lightheadedness, vision changes, recurrent headaches, or swelling of extremities. Follow up in 1 month   Gastroesophageal reflux disease without esophagitis Assessment & Plan: Well controlled on protonix . Anti-reflux measures such as raising the head of the bed, avoiding tight clothing or belts, avoiding eating late at night and not lying down shortly after mealtime  and achieving weight loss are discussed. Avoid ASA, NSAID's, caffeine, peppermints, alcohol and tobacco. Alert me if there are persistent symptoms, dysphagia, weight loss or GI bleeding.    Diabetes  mellitus type 2, insulin  dependent (HCC) Assessment & Plan: Continue Trulicity  3mg  weekly. Continue Tresiba  10 units daily. Continue amaryl  2mg  BID. Start CGM, request to link sent. Notify office if BG consistently drops <70. SABRARecommend heart healthy diet such as Mediterranean diet with whole grains, fruits, vegetable, fish, lean meats, nuts, and olive oil. Limit salt. Encouraged moderate walking, 3-5 times/week for 30-50 minutes each session. Aim for at least 150 minutes.week. Goal should be pace of 3 miles/hours, or walking 1.5 miles in 30 minutes. Seek medical care for urinary frequency, extreme thirst, vision changes, lightheadedness, dizziness.  Follow up in 3 months or sooner if needed   Hyperlipidemia associated with type 2 diabetes mellitus (HCC) Assessment & Plan: Continue atorvastatin . I recommend consuming a heart healthy diet such as Mediterranean diet or DASH diet with whole grains, fruits, vegetable, fish, lean meats, nuts, and olive oil. Limit sweets and processed foods. I also encourage moderate intensity exercise 150 minutes weekly. This is 3-5 times weekly for 30-50 minutes each session. Goal should be pace of 3 miles/hours, or walking 1.5 miles in 30 minutes. The ASCVD Risk score (Arnett DK, et al., 2019) failed to calculate for the following reasons:   The valid total cholesterol range is 130 to 320 mg/dL    Other orders -     amLODIPine Besylate; Take 1 tablet (5 mg total) by mouth daily.  Dispense: 90 tablet; Refill: 0     Follow up plan: Return in about 4 weeks (around 10/18/2024) for hypertension.  Jeoffrey GORMAN Barrio, FNP

## 2024-09-20 NOTE — Assessment & Plan Note (Signed)
 Continue valsartan  320mg  daily, start amlodipine 5mg  daily as recommended by cardiology. Recommend heart healthy diet such as Mediterranean diet with whole grains, fruits, vegetable, fish, lean meats, nuts, and olive oil. Limit salt. Encouraged moderate walking, 3-5 times/week for 30-50 minutes each session. Aim for at least 150 minutes.week. Goal should be pace of 3 miles/hours, or walking 1.5 miles in 30 minutes. Avoid tobacco products. Avoid excess alcohol. Take medications as prescribed and bring medications and blood pressure log with cuff to each office visit. Seek medical care for chest pain, palpitations, shortness of breath with exertion, dizziness/lightheadedness, vision changes, recurrent headaches, or swelling of extremities. Follow up in 1 month

## 2024-09-20 NOTE — Assessment & Plan Note (Signed)
 Continue atorvastatin . I recommend consuming a heart healthy diet such as Mediterranean diet or DASH diet with whole grains, fruits, vegetable, fish, lean meats, nuts, and olive oil. Limit sweets and processed foods. I also encourage moderate intensity exercise 150 minutes weekly. This is 3-5 times weekly for 30-50 minutes each session. Goal should be pace of 3 miles/hours, or walking 1.5 miles in 30 minutes. The ASCVD Risk score (Arnett DK, et al., 2019) failed to calculate for the following reasons:   The valid total cholesterol range is 130 to 320 mg/dL

## 2024-09-26 ENCOUNTER — Ambulatory Visit: Admitting: Family Medicine

## 2024-10-03 ENCOUNTER — Ambulatory Visit: Admitting: Family Medicine

## 2024-10-03 DIAGNOSIS — D171 Benign lipomatous neoplasm of skin and subcutaneous tissue of trunk: Secondary | ICD-10-CM | POA: Diagnosis not present

## 2024-10-03 MED ORDER — CEPHALEXIN 500 MG PO CAPS: 500.0000 mg | ORAL_CAPSULE | Freq: Three times a day (TID) | ORAL | 0 refills | Status: DC

## 2024-10-03 NOTE — Progress Notes (Signed)
 Subjective:    Patient ID: Danielle Hurley, female    DOB: [birth year], 63 y.o.   MRN: [id number]  HPI Patient has a 2x3 cm lipoma on her lower right back.  She was referred to me to have this excised by my partner.  Is approximately 2 x 3 cm in diameter.  Been there for 6 years.  Soft and freely mobile.  Past Medical History:  Diagnosis Date   Arthritis    COPD (chronic obstructive pulmonary disease) (HCC)    Diabetes mellitus without complication (HCC) 1999   Dyspnea    GERD (gastroesophageal reflux disease)    Hepatitis C    treated 2022   High cholesterol    History of kidney stones    Hypertension 1999   Precordial chest pain 08/09/2024   PET MPI 08/09/24: no ischemia or infarction; EF 70; MBFR 2.68 (normal); no CAC on CT; Low Risk     Tuberculosis    non contagious Dx 05/2020   Past Surgical History:  Procedure Laterality Date   CHOLECYSTECTOMY  1998   COLONOSCOPY  03/22/2023   first colonoscopy   TUBAL LIGATION  01/1995   VIDEO BRONCHOSCOPY WITH ENDOBRONCHIAL NAVIGATION N/A 09/04/2021   Procedure: VIDEO BRONCHOSCOPY WITH ENDOBRONCHIAL NAVIGATION;  Surgeon: Kerrin Elspeth BROCKS, MD;  Location: [facility] OR;  Service: Thoracic;  Laterality: N/A;   Current Outpatient Medications on File Prior to Visit  Medication Sig Dispense Refill   Accu-Chek Softclix Lancets lancets USE 1  TO CHECK GLUCOSE IN THE MORNING AND 1 AT NOON AND 1 AT BEDTIME 100 each 0   amLODipine (NORVASC) 5 MG tablet Take 1 tablet (5 mg total) by mouth daily. 90 tablet 0   aspirin-acetaminophen-caffeine (EXCEDRIN MIGRAINE) 250-250-65 MG tablet Take 1 tablet by mouth every 6 (six) hours as needed for headache.     atorvastatin  (LIPITOR) 20 MG tablet Take 1 tablet (20 mg total) by mouth daily. 90 tablet 1   Blood Glucose Monitoring Suppl DEVI 1 each by Does not apply route in the morning, at noon, and at bedtime. May substitute to any manufacturer covered by patient's insurance. 1 each 0   cetirizine (ZYRTEC) 10 MG  tablet Take 10 mg by mouth daily as needed for allergies.     Cholecalciferol (VITAMIN D ) 50 MCG (2000 UT) tablet Take 2,000 Units by mouth daily.     ciclopirox  (PENLAC ) 8 % solution Apply topically at bedtime. Apply over nail and surrounding skin. Apply daily over previous coat. After seven (7) days, may remove with alcohol and continue cycle. 6.6 mL 0   Continuous Glucose Receiver (FREESTYLE LIBRE 3 READER) DEVI Libre 3 reader 1 each 1   Continuous Glucose Sensor (FREESTYLE LIBRE 3 PLUS SENSOR) MISC Change sensor every 15 days. 2 each 3   cyclobenzaprine  (FLEXERIL ) 5 MG tablet Take 1 tablet (5 mg total) by mouth daily as needed for muscle spasms. for muscle spams 10 tablet 0   Dulaglutide  (TRULICITY ) 3 MG/0.5ML SOAJ INJECT 3 MG INTO THE SKIN ONE TIME PER WEEK 6 mL 1   glimepiride  (AMARYL ) 2 MG tablet Take 1 tablet (2 mg total) by mouth in the morning and at bedtime. 180 tablet 1   glucose blood (TRUETRACK TEST) test strip Bloodsugar testing QD and prn 100 each 12   insulin  degludec (TRESIBA ) 100 UNIT/ML FlexTouch Pen Inject 10 Units into the skin daily. 3 mL 1   Insulin  Degludec FlexTouch 100 UNIT/ML SOPN INJECT 10 UNITS INTO THE SKIN DAILY 100 mL 1  Insulin  Pen Needle (COMFORT TOUCH INSULIN  PEN NEED) 32G X 6 MM MISC 1 Needle by Does not apply route daily. 100 each 3   pantoprazole  (PROTONIX ) 20 MG tablet Take 1 tablet (20 mg total) by mouth daily. 90 tablet 1   valsartan  (DIOVAN ) 320 MG tablet Take 1 tablet (320 mg total) by mouth daily. 90 tablet 3   No current facility-administered medications on file prior to visit.   No Known Allergies Social History   Socioeconomic History   Marital status: Widowed    Spouse name: Not on file   Number of children: 4   Years of education: Not on file   Highest education level: 12th grade  Occupational History   Not on file  Tobacco Use   Smoking status: Former    Current packs/day: 0.00    Average packs/day: 1 pack/day for 40.0 years (40.0 ttl  pk-yrs)    Types: Cigarettes    Start date: 17    Quit date: 2014    Years since quitting: 11.8    Passive exposure: Past   Smokeless tobacco: Never  Vaping Use   Vaping status: Never Used  Substance and Sexual Activity   Alcohol use: Not Currently    Comment: none since hep c dx   Drug use: Not Currently    Types: Marijuana, Cocaine, Methamphetamines, IV   Sexual activity: Not Currently    Birth control/protection: Abstinence, Surgical  Other Topics Concern   Not on file  Social History Narrative   Not on file   Social Drivers of Health   Financial Resource Strain: Low Risk  (09/16/2024)   Overall Financial Resource Strain (CARDIA)    Difficulty of Paying Living Expenses: Not hard at all  Food Insecurity: No Food Insecurity (09/16/2024)   Hunger Vital Sign    Worried About Running Out of Food in the Last Year: Never true    Ran Out of Food in the Last Year: Never true  Transportation Needs: No Transportation Needs (09/16/2024)   PRAPARE - Administrator, Civil Service (Medical): No    Lack of Transportation (Non-Medical): No  Physical Activity: Insufficiently Active (09/16/2024)   Exercise Vital Sign    Days of Exercise per Week: 3 days    Minutes of Exercise per Session: 10 min  Stress: Stress Concern Present (09/16/2024)   [employer] of Occupational Health - Occupational Stress Questionnaire    Feeling of Stress: To some extent  Social Connections: Socially Isolated (09/16/2024)   Social Connection and Isolation Panel    Frequency of Communication with Friends and Family: Three times a week    Frequency of Social Gatherings with Friends and Family: More than three times a week    Attends Religious Services: Never    Database Administrator or Organizations: No    Attends Banker Meetings: Not on file    Marital Status: Widowed  Intimate Partner Violence: Not At Risk (03/21/2024)   Humiliation, Afraid, Rape, and Kick questionnaire     Fear of Current or Ex-Partner: No    Emotionally Abused: No    Physically Abused: No    Sexually Abused: No      Review of Systems  All other systems reviewed and are negative.      Objective:   Physical Exam Constitutional:      Appearance: Normal appearance. She is not ill-appearing.  Cardiovascular:     Rate and Rhythm: Normal rate.     Heart sounds:  Normal heart sounds.  Pulmonary:     Effort: Pulmonary effort is normal.     Breath sounds: Normal breath sounds.  Skin:     Neurological:     Mental Status: She is alert.           Assessment & Plan:  Lipoma of torso Skin was anesthetized with 0.1% lidocaine  with epinephrine .  The patient was prepped and draped in sterile fashion.  A 3 cm x 4 cm elliptical excision was made around the entire lipoma.  The lipoma was removed in its entirety.  The skin edges were then approximated with 6, 3-0 Ethilon sutures.  Return in 7 days for suture removal

## 2024-10-04 ENCOUNTER — Ambulatory Visit: Payer: Self-pay

## 2024-10-04 NOTE — Telephone Encounter (Signed)
 FYI Only or Action Required?: Action required by provider: clinical question for provider and update on patient condition.  Patient was last seen in primary care on 10/03/2024 by Duanne Butler DASEN, MD.  Called Nurse Triage reporting Post-op Problem.  Symptoms began today.  Interventions attempted: Prescription medications: muscle relaxer.  Symptoms are: unchanged.  Triage Disposition: Call PCP Now  Patient/caregiver understands and will follow disposition?: Yes     Copied from CRM #[id number]. Topic: Clinical - Red Word Triage >> Oct 04, 2024 10:55 AM Avram MATSU wrote: Red Word that prompted transfer to Nurse Triage: pain due to having surgery      Reason for Disposition  [1] SEVERE post-op pain (e.g., excruciating, pain scale 8-10) AND [2] not controlled with pain medications  Answer Assessment - Initial Assessment Questions Patient had a lipoma removed from her back by Dr. Duanne yesterday. She states that today she is having pain to her incision site and would like something prescribed if possible. Please advise.      1. SYMPTOM: What's the main symptom you're concerned about? (e.g., pain, fever, vomiting)     Pain 2. ONSET: When did pain start?     today 3. SURGERY: What surgery did you have?     Lipoma removed from back  4. DATE of SURGERY: When was the surgery?      Yesterday  5. ANESTHESIA: What type of anesthesia did you have? (e.g., general, spinal, epidural, local)     Local  6. DRAINS: Were any drains place in or around the wound? (e.g., Hemovac, Jackson-Pratt, Penrose)     No drain 7. PAIN: Is there any pain? If Yes, ask: How bad is it?  (Scale 0-10; or none, mild, moderate, severe)     9/10 8. FEVER: Do you have a fever? If Yes, ask: What is your temperature, how was it measured, and when did it start?     No 9. VOMITING: Is there any vomiting? If Yes, ask: How many times?     No 10. BLEEDING: Is there any bleeding? If Yes, ask:  How much? and Where?       No 11. OTHER SYMPTOMS: Do you have any other symptoms? (e.g., drainage from wound, painful urination, constipation)       No  Protocols used: Post-Op Symptoms and Questions-A-AH

## 2024-10-09 ENCOUNTER — Encounter: Payer: Self-pay | Admitting: Family Medicine

## 2024-10-09 ENCOUNTER — Ambulatory Visit: Admitting: Family Medicine

## 2024-10-09 VITALS — BP 136/71 | HR 81 | Temp 98.0°F | Ht 70.0 in

## 2024-10-09 DIAGNOSIS — E1165 Type 2 diabetes mellitus with hyperglycemia: Secondary | ICD-10-CM

## 2024-10-09 DIAGNOSIS — Z794 Long term (current) use of insulin: Secondary | ICD-10-CM

## 2024-10-09 DIAGNOSIS — D171 Benign lipomatous neoplasm of skin and subcutaneous tissue of trunk: Secondary | ICD-10-CM

## 2024-10-09 MED ORDER — FREESTYLE LIBRE 3 PLUS SENSOR MISC: 3 refills | Status: DC

## 2024-10-09 MED ORDER — TRULICITY 3 MG/0.5ML ~~LOC~~ SOAJ: 3.0000 mg | SUBCUTANEOUS | 1 refills | Status: DC

## 2024-10-09 NOTE — Progress Notes (Signed)
 Acute Office Visit  Patient ID: Danielle Hurley, female    DOB: Oct 04, 1961, 63 y.o.   MRN: [id number]  PCP: Kayla Jeoffrey RAMAN, FNP  Chief Complaint  Patient presents with   Acute Visit    Remove sutures  Refills of Trulicity  and freestyle libre 3     Subjective:     HPI  Discussed the use of AI scribe software for clinical note transcription with the patient, who gave verbal consent to proceed.  History of Present Illness Danielle Hurley is a 63 year old female who presents for suture removal following a cyst excision.  Approximately one week ago, she underwent a cyst excision. The cyst was described as being the size of a 'shooter marble'. The procedure involved placing two internal stitches and six external stitches. The area itches and is slightly painful when pressed. The stitches were tight, and there was slight bleeding on the first three days post-procedure. She has been using a waterproof bandage to cover the site.  She is currently taking antibiotics as prescribed following the procedure. She also uses Trulicity  for diabetes management, which she has run out of and requests a refill. Additionally, she uses a Libre 3 sensor for glucose monitoring but placed the first sensor incorrectly, affecting its readings. She is unsure if she has refills available for the [facility] and requests a refill for it as well.  Suture Removal  Date/Time: 10/09/2024 2:35 PM  Performed by: Kayla Jeoffrey RAMAN, FNP Authorized by: Kayla Jeoffrey S, FNP  Body area: trunk Location details: back Wound Appearance: clean Sutures Removed: 6 Post-removal: Steri-Strips applied, dressing applied and antibiotic ointment applied Facility: sutures placed in this facility Patient tolerance: patient tolerated the procedure well with no immediate complications      Review of Systems  All other systems reviewed and are negative.   Past Medical History:  Diagnosis Date   Arthritis    COPD (chronic obstructive  pulmonary disease) (HCC)    Diabetes mellitus without complication (HCC) 1999   Dyspnea    GERD (gastroesophageal reflux disease)    Hepatitis C    treated 2022   High cholesterol    History of kidney stones    Hypertension 1999   Precordial chest pain 08/09/2024   PET MPI 08/09/24: no ischemia or infarction; EF 70; MBFR 2.68 (normal); no CAC on CT; Low Risk     Tuberculosis    non contagious Dx 05/2020    Past Surgical History:  Procedure Laterality Date   CHOLECYSTECTOMY  1998   COLONOSCOPY  03/22/2023   first colonoscopy   TUBAL LIGATION  01/1995   VIDEO BRONCHOSCOPY WITH ENDOBRONCHIAL NAVIGATION N/A 09/04/2021   Procedure: VIDEO BRONCHOSCOPY WITH ENDOBRONCHIAL NAVIGATION;  Surgeon: Kerrin Elspeth BROCKS, MD;  Location: [facility] OR;  Service: Thoracic;  Laterality: N/A;    Current Outpatient Medications on File Prior to Visit  Medication Sig Dispense Refill   Accu-Chek Softclix Lancets lancets USE 1  TO CHECK GLUCOSE IN THE MORNING AND 1 AT NOON AND 1 AT BEDTIME 100 each 0   amLODipine (NORVASC) 5 MG tablet Take 1 tablet (5 mg total) by mouth daily. 90 tablet 0   aspirin-acetaminophen-caffeine (EXCEDRIN MIGRAINE) 250-250-65 MG tablet Take 1 tablet by mouth every 6 (six) hours as needed for headache.     atorvastatin  (LIPITOR) 20 MG tablet Take 1 tablet (20 mg total) by mouth daily. 90 tablet 1   Blood Glucose Monitoring Suppl DEVI 1 each by Does not apply route in  the morning, at noon, and at bedtime. May substitute to any manufacturer covered by patient's insurance. 1 each 0   cephALEXin  (KEFLEX ) 500 MG capsule Take 1 capsule (500 mg total) by mouth 3 (three) times daily. 21 capsule 0   cetirizine (ZYRTEC) 10 MG tablet Take 10 mg by mouth daily as needed for allergies.     Cholecalciferol (VITAMIN D ) 50 MCG (2000 UT) tablet Take 2,000 Units by mouth daily.     ciclopirox  (PENLAC ) 8 % solution Apply topically at bedtime. Apply over nail and surrounding skin. Apply daily over previous  coat. After seven (7) days, may remove with alcohol and continue cycle. 6.6 mL 0   Continuous Glucose Receiver (FREESTYLE LIBRE 3 READER) DEVI Libre 3 reader 1 each 1   cyclobenzaprine  (FLEXERIL ) 5 MG tablet Take 1 tablet (5 mg total) by mouth daily as needed for muscle spasms. for muscle spams 10 tablet 0   glimepiride  (AMARYL ) 2 MG tablet Take 1 tablet (2 mg total) by mouth in the morning and at bedtime. 180 tablet 1   glucose blood (TRUETRACK TEST) test strip Bloodsugar testing QD and prn 100 each 12   insulin  degludec (TRESIBA ) 100 UNIT/ML FlexTouch Pen Inject 10 Units into the skin daily. 3 mL 1   Insulin  Degludec FlexTouch 100 UNIT/ML SOPN INJECT 10 UNITS INTO THE SKIN DAILY 100 mL 1   Insulin  Pen Needle (COMFORT TOUCH INSULIN  PEN NEED) 32G X 6 MM MISC 1 Needle by Does not apply route daily. 100 each 3   pantoprazole  (PROTONIX ) 20 MG tablet Take 1 tablet (20 mg total) by mouth daily. 90 tablet 1   valsartan  (DIOVAN ) 320 MG tablet Take 1 tablet (320 mg total) by mouth daily. 90 tablet 3   No current facility-administered medications on file prior to visit.    No Known Allergies     Objective:    BP 136/71   Pulse 81   Temp 98 F (36.7 C)   Ht 5' 10 (1.778 m)   SpO2 97%   BMI 25.22 kg/m    Physical Exam Vitals and nursing note reviewed.  Constitutional:      Appearance: Normal appearance. She is normal weight.  HENT:     Head: Normocephalic and atraumatic.  Skin:    General: Skin is warm and dry.     Findings: Laceration present.         Comments: Incision well approimated, clean, dry, intact  Neurological:     General: No focal deficit present.     Mental Status: She is alert and oriented to person, place, and time. Mental status is at baseline.  Psychiatric:        Mood and Affect: Mood normal.        Behavior: Behavior normal.        Thought Content: Thought content normal.        Judgment: Judgment normal.       No results found for any visits on  10/09/24.     Assessment & Plan:   Problem List Items Addressed This Visit     Type 2 diabetes mellitus with hyperglycemia, without long-term current use of insulin  (HCC)   Relevant Medications   Dulaglutide  (TRULICITY ) 3 MG/0.5ML SOAJ    Assessment and Plan Assessment & Plan Postoperative wound care following torso mass excision Postoperative wound healing well with minor crusting and bleeding. Wound still kind of hurts to press against it. SteriStrips applied for support. - Removed six external sutures. -  Applied SteriStrips to prevent stretching. - Covered SteriStrips with a larger bandage. - Apply antibiotic ointment. - Allow SteriStrips to come off naturally; trim edges as needed. - Permitted showering with Tegaderm bandage.  Type 2 diabetes mellitus, insulin  dependent Type 2 diabetes managed with insulin . Trulicity  refill needed. Libre 3 sensor used for glucose monitoring. - Refilled Trulicity  prescription. - Refilled Libre 3 sensor prescription.    Meds ordered this encounter  Medications   Continuous Glucose Sensor (FREESTYLE LIBRE 3 PLUS SENSOR) MISC    Sig: Change sensor every 15 days.    Dispense:  2 each    Refill:  3    Supervising Provider:   DUANNE LOWERS T [[id number]]   Dulaglutide  (TRULICITY ) 3 MG/0.5ML SOAJ    Sig: Inject 3 mg into the skin once a week.    Dispense:  6 mL    Refill:  1    Supervising Provider:   DUANNE LOWERS T [[id number]]    Return if symptoms worsen or fail to improve.  Jeoffrey GORMAN Barrio, FNP [facility]

## 2024-11-14 ENCOUNTER — Ambulatory Visit: Admitting: Family Medicine

## 2024-11-14 ENCOUNTER — Encounter: Payer: Self-pay | Admitting: Family Medicine

## 2024-11-14 ENCOUNTER — Ambulatory Visit: Payer: Self-pay

## 2024-11-14 VITALS — BP 124/61 | HR 70 | Temp 98.0°F | Ht 70.0 in | Wt 171.8 lb

## 2024-11-14 DIAGNOSIS — M5442 Lumbago with sciatica, left side: Secondary | ICD-10-CM | POA: Diagnosis not present

## 2024-11-14 DIAGNOSIS — K5900 Constipation, unspecified: Secondary | ICD-10-CM

## 2024-11-14 DIAGNOSIS — E119 Type 2 diabetes mellitus without complications: Secondary | ICD-10-CM

## 2024-11-14 DIAGNOSIS — E11649 Type 2 diabetes mellitus with hypoglycemia without coma: Secondary | ICD-10-CM | POA: Diagnosis not present

## 2024-11-14 DIAGNOSIS — G8929 Other chronic pain: Secondary | ICD-10-CM

## 2024-11-14 DIAGNOSIS — Z794 Long term (current) use of insulin: Secondary | ICD-10-CM

## 2024-11-14 MED ORDER — CYCLOBENZAPRINE HCL 5 MG PO TABS: 5.0000 mg | ORAL_TABLET | Freq: Every day | ORAL | 0 refills | Status: AC | PRN

## 2024-11-14 MED ORDER — INSULIN DEGLUDEC FLEXTOUCH 100 UNIT/ML ~~LOC~~ SOPN: 5.0000 [IU] | PEN_INJECTOR | Freq: Every day | SUBCUTANEOUS | 1 refills | Status: AC

## 2024-11-14 NOTE — Telephone Encounter (Signed)
 FYI Only or Action Required?: FYI only for provider: appointment scheduled on 11/14/24.  Patient was last seen in primary care on 10/09/2024 by Kayla Jeoffrey RAMAN, FNP.  Called Nurse Triage reporting Blood Sugar Problem.  Symptoms began several weeks ago.  Interventions attempted: Dietary changes.  Symptoms are: stable.  Triage Disposition: Call PCP Within 24 Hours  Patient/caregiver understands and will follow disposition?: Yes Reason for Disposition  [1] Blood glucose 70 mg/dL (3.9 mmol/L) or below OR symptomatic, now improved with Care Advice AND [2] cause unknown  Answer Assessment - Initial Assessment Questions Patient reports starting Tresiba  in April and states within the last couple weeks the lows have gotten bad. Reports eating some cookies or crackers and still experiencing the lows. States the blood sugar is dropping mostly in the evening. Patient reports the low sugars sneak up on her, she tries to eat a little something every 4 hours so she is not sure why its dropping so low.  1. SYMPTOMS: What symptoms are you concerned about?     Mild shakes  2. ONSET:  When did the symptoms start?     On and off last few weeks  3. BLOOD GLUCOSE: What is your blood glucose level?      134 currently  4. USUAL RANGE: What is your blood glucose level usually? (e.g., usual fasting morning value, usual evening value)     Patient tries to keep blood sugar above 100  5. TYPE 1 or 2:  Do you know what type of diabetes you have?  (e.g., Type 1, Type 2, Gestational; doesn't know)      Type 2   6. INSULIN : Do you take insulin ? What type of insulin (s) do you use? What is the mode of delivery? (syringe, pen; injection or pump) When did you last give yourself an insulin  dose? (i.e., time or hours/minutes ago) How much did you give? (i.e., how many units)     insulin  degludec (TRESIBA ) 100 UNIT/ML FlexTouch Pen  7. LOW BLOOD GLUCOSE TREATMENT: What have you done so far to  treat the low blood glucose level?     Eats when the alarm goes off that sugar is low  8. FOOD: When did you last eat or drink?       Cottage cheese, drinkable yogurt, baby food pouches  Protocols used: Diabetes - Low Blood Sugar-A-AH  Copied from CRM #[id number]. Topic: Clinical - Red Word Triage >> Nov 14, 2024  9:08 AM Tobias L wrote: Red Word that prompted transfer to Nurse Triage: this morning blood sugar levels were at  69, drops below 100 a lot, gets shaky, lowest has been 65

## 2024-11-14 NOTE — Progress Notes (Signed)
 Acute Office Visit  Patient ID: Danielle Hurley, female    DOB: 05/25/1961, 63 y.o.   MRN: [id number]  PCP: Kayla Jeoffrey RAMAN, FNP  Chief Complaint  Patient presents with   Acute Visit    Low blood sugar from reader, and home monitor is giving 10 degree difference readings  Would like Pneumonia vaccination today.       Subjective:     HPI  Discussed the use of AI scribe software for clinical note transcription with the patient, who gave verbal consent to proceed.  History of Present Illness Danielle Hurley is a 63 year old female with diabetes who presents with concerns about low blood glucose events and medication management.  She experiences low blood glucose events, particularly at night, with levels dropping to below 70 mg/dL around 2-3 AM. She uses a Libre device to monitor her glucose levels, which has shown 10 low glucose events in the last 14 days, mostly occurring at night. She takes 10 units of Tresiba  (insulin  degludec) every morning and has noticed that it reacts faster at night, causing her blood sugar to drop more significantly than in the morning. She also takes 2 mg of glimepiride  in the morning but has stopped taking it at night. Her home blood sugar readings range from 72 to 116 mg/dL, and 05% of her Libre readings are within target range.  She has been experiencing constipation with 'pebble poop' since late April or early May. She started consuming Activia, a drinkable yogurt, which has helped reduce straining. She avoids using MiraLAX or stool softeners due to concerns about sodium and water balance. She consumes whole grain wheat bread and baby food pouches for vegetables, and drinks 5-6 cups of water daily.  She experiences back pain that radiates to her shoulder blades when standing for prolonged periods, such as while cooking. She uses Flexeril  sparingly, about once a week, to manage this pain.  She has been managing her diet carefully, avoiding high sugar foods, and  has found certain snacks like popcorn chips that satisfy her cravings without significantly affecting her blood sugar.   Review of Systems  All other systems reviewed and are negative.   Past Medical History:  Diagnosis Date   Arthritis    COPD (chronic obstructive pulmonary disease) (HCC)    Diabetes mellitus without complication (HCC) 1999   Dyspnea    GERD (gastroesophageal reflux disease)    Hepatitis C    treated 2022   High cholesterol    History of kidney stones    Hypertension 1999   Precordial chest pain 08/09/2024   PET MPI 08/09/24: no ischemia or infarction; EF 70; MBFR 2.68 (normal); no CAC on CT; Low Risk     Tuberculosis    non contagious Dx 05/2020    Past Surgical History:  Procedure Laterality Date   CHOLECYSTECTOMY  1998   COLONOSCOPY  03/22/2023   first colonoscopy   TUBAL LIGATION  01/1995   VIDEO BRONCHOSCOPY WITH ENDOBRONCHIAL NAVIGATION N/A 09/04/2021   Procedure: VIDEO BRONCHOSCOPY WITH ENDOBRONCHIAL NAVIGATION;  Surgeon: Kerrin Elspeth BROCKS, MD;  Location: [facility] OR;  Service: Thoracic;  Laterality: N/A;    Medications Ordered Prior to Encounter[1]  Allergies[2]     Objective:    BP 124/61   Pulse 70   Temp 98 F (36.7 C)   Ht 5' 10 (1.778 m)   Wt 171 lb 12.8 oz (77.9 kg)   SpO2 99%   BMI 24.65 kg/m    Physical Exam  Vitals and nursing note reviewed.  Constitutional:      Appearance: Normal appearance. She is normal weight.  HENT:     Head: Normocephalic and atraumatic.  Skin:    General: Skin is warm and dry.  Neurological:     General: No focal deficit present.     Mental Status: She is alert and oriented to person, place, and time. Mental status is at baseline.  Psychiatric:        Mood and Affect: Mood normal.        Behavior: Behavior normal.        Thought Content: Thought content normal.        Judgment: Judgment normal.       No results found for any visits on 11/14/24.     Assessment & Plan:   Problem List  Items Addressed This Visit       Endocrine   Diabetes mellitus type 2, insulin  dependent (HCC) - Primary   Relevant Medications   Insulin  Degludec FlexTouch 100 UNIT/ML SOPN   Other Relevant Orders   Microalbumin / creatinine urine ratio     Nervous and Auditory   Chronic left-sided low back pain with left-sided sciatica   Relevant Medications   cyclobenzaprine  (FLEXERIL ) 5 MG tablet    Assessment and Plan Assessment & Plan Type 2 diabetes mellitus, insulin  dependent Experiencing nocturnal hypoglycemia with glucose levels at 69 mg/dL. Current regimen includes insulin  degludec and glimepiride . Blood glucose 94% in target range with occasional lows. Tresiba  coverage may end in February. - Reduced insulin  degludec to 5 units daily. - Consider discontinuing Tresiba  if glucose remains controlled. - Continue glimepiride  2 mg in the morning and Trulicity  3mg  weekly. - Checked urine protein for kidney function.  Chronic left-sided low back pain with sciatica Pain radiates to shoulder blades, worsens with prolonged standing. Uses cyclobenzaprine  sparingly. - Continue cyclobenzaprine  as needed, sparingly.  Constipation Constipation since late April or early May. Some improvement with Activia. Avoids stool softeners due to sodium. - Continue Activia drinkable yogurt. - Increase dietary fiber intake, consider Metamucil or other fiber supplements. - Ensure adequate hydration.    Meds ordered this encounter  Medications   cyclobenzaprine  (FLEXERIL ) 5 MG tablet    Sig: Take 1 tablet (5 mg total) by mouth daily as needed for muscle spasms. for muscle spams    Dispense:  10 tablet    Refill:  0   Insulin  Degludec FlexTouch 100 UNIT/ML SOPN    Sig: Inject 5 Units into the skin daily.    Dispense:  100 mL    Refill:  1    Supervising Provider:   DUANNE LOWERS T [[id number]]    Return in about 4 weeks (around 12/12/2024) for diabetes.  Jeoffrey GORMAN Barrio, FNP [facility]      [1]  Current Outpatient Medications on File Prior to Visit  Medication Sig Dispense Refill   Accu-Chek Softclix Lancets lancets USE 1  TO CHECK GLUCOSE IN THE MORNING AND 1 AT NOON AND 1 AT BEDTIME 100 each 0   amLODipine  (NORVASC ) 5 MG tablet Take 1 tablet (5 mg total) by mouth daily. 90 tablet 0   aspirin-acetaminophen-caffeine (EXCEDRIN MIGRAINE) 250-250-65 MG tablet Take 1 tablet by mouth every 6 (six) hours as needed for headache.     atorvastatin  (LIPITOR) 20 MG tablet Take 1 tablet (20 mg total) by mouth daily. 90 tablet 1   Blood Glucose Monitoring Suppl DEVI 1 each by Does not  apply route in the morning, at noon, and at bedtime. May substitute to any manufacturer covered by patient's insurance. 1 each 0   cetirizine (ZYRTEC) 10 MG tablet Take 10 mg by mouth daily as needed for allergies.     Cholecalciferol (VITAMIN D ) 50 MCG (2000 UT) tablet Take 2,000 Units by mouth daily.     ciclopirox  (PENLAC ) 8 % solution Apply topically at bedtime. Apply over nail and surrounding skin. Apply daily over previous coat. After seven (7) days, may remove with alcohol and continue cycle. 6.6 mL 0   Continuous Glucose Receiver (FREESTYLE LIBRE 3 READER) DEVI Libre 3 reader 1 each 1   Continuous Glucose Sensor (FREESTYLE LIBRE 3 PLUS SENSOR) MISC Change sensor every 15 days. 2 each 3   Dulaglutide  (TRULICITY ) 3 MG/0.5ML SOAJ Inject 3 mg into the skin once a week. 6 mL 1   glimepiride  (AMARYL ) 2 MG tablet Take 1 tablet (2 mg total) by mouth in the morning and at bedtime. 180 tablet 1   glucose blood (TRUETRACK TEST) test strip Bloodsugar testing QD and prn 100 each 12   Insulin  Pen Needle (COMFORT TOUCH INSULIN  PEN NEED) 32G X 6 MM MISC 1 Needle by Does not apply route daily. 100 each 3   pantoprazole  (PROTONIX ) 20 MG tablet Take 1 tablet (20 mg total) by mouth daily. 90 tablet 1   valsartan  (DIOVAN ) 320 MG tablet Take 1 tablet (320 mg total) by mouth daily. 90 tablet 3   No current  facility-administered medications on file prior to visit.  [2] No Known Allergies

## 2024-11-15 ENCOUNTER — Ambulatory Visit: Admitting: Podiatry

## 2024-11-15 ENCOUNTER — Other Ambulatory Visit

## 2024-11-15 DIAGNOSIS — E1165 Type 2 diabetes mellitus with hyperglycemia: Secondary | ICD-10-CM

## 2024-11-15 DIAGNOSIS — M79674 Pain in right toe(s): Secondary | ICD-10-CM

## 2024-11-15 DIAGNOSIS — B351 Tinea unguium: Secondary | ICD-10-CM

## 2024-11-15 DIAGNOSIS — L84 Corns and callosities: Secondary | ICD-10-CM

## 2024-11-15 DIAGNOSIS — M79675 Pain in left toe(s): Secondary | ICD-10-CM

## 2024-11-15 LAB — MICROALBUMIN / CREATININE URINE RATIO
Creatinine, Urine: 132 mg/dL (ref 20–275)
Microalb Creat Ratio: 34 mg/g{creat} — ABNORMAL HIGH (ref <30)
Microalb, Ur: 4.5 mg/dL

## 2024-11-15 NOTE — Progress Notes (Signed)
 This patient returns to my office for at risk foot care.  This patient requires this care by a professional since this patient will be at risk due to having Type 2 diabetes. This patient is unable to cut nails herself since the patient cannot reach her nails.These nails are painful walking and wearing shoes.  This patient presents for at risk foot care today.  General Appearance  Alert, conversant and in no acute stress.  Vascular  Dorsalis pedis and posterior tibial  pulses are palpable  bilaterally.  Capillary return is within normal limits  bilaterally. Temperature is within normal limits  bilaterally.  Neurologic  Senn-Weinstein monofilament wire test within normal limits  bilaterally. Muscle power within normal limits bilaterally.  Nails Thick disfigured discolored nails with subungual debris  from hallux to fifth toes bilaterally. No evidence of bacterial infection or drainage bilaterally.  Orthopedic  No limitations of motion  feet .  No crepitus or effusions noted.  No bony pathology or digital deformities noted.  Skin  normotropic skin with no porokeratosis noted bilaterally.  No signs of infections or ulcers noted.     Onychomycosis  Pain in right toes  Pain in left toes  Consent was obtained for treatment procedures.   Mechanical debridement of nails 1-5  bilaterally performed with a nail nipper.  Filed with dremel without incident.    Return office visit    3 months                  Told patient to return for periodic foot care and evaluation due to potential at risk complications.   Cordella Bold DPM

## 2024-12-06 ENCOUNTER — Other Ambulatory Visit: Payer: Self-pay | Admitting: Family Medicine

## 2024-12-06 DIAGNOSIS — E1169 Type 2 diabetes mellitus with other specified complication: Secondary | ICD-10-CM

## 2024-12-11 ENCOUNTER — Ambulatory Visit (HOSPITAL_BASED_OUTPATIENT_CLINIC_OR_DEPARTMENT_OTHER)
Admission: RE | Admit: 2024-12-11 | Discharge: 2024-12-11 | Disposition: A | Discharge status: Home / Self Care | Source: Ambulatory Visit | Attending: Family Medicine | Admitting: Family Medicine

## 2024-12-11 DIAGNOSIS — E1165 Type 2 diabetes mellitus with hyperglycemia: Secondary | ICD-10-CM | POA: Insufficient documentation

## 2024-12-11 DIAGNOSIS — Z780 Asymptomatic menopausal state: Secondary | ICD-10-CM | POA: Diagnosis present

## 2024-12-11 DIAGNOSIS — Z13820 Encounter for screening for osteoporosis: Secondary | ICD-10-CM | POA: Insufficient documentation

## 2024-12-13 ENCOUNTER — Ambulatory Visit (HOSPITAL_COMMUNITY)
Admission: RE | Admit: 2024-12-13 | Discharge: 2024-12-13 | Disposition: A | Discharge status: Home / Self Care | Source: Ambulatory Visit | Attending: Cardiology | Admitting: Cardiology

## 2024-12-13 ENCOUNTER — Ambulatory Visit: Payer: Self-pay | Admitting: Family Medicine

## 2024-12-13 DIAGNOSIS — I3139 Other pericardial effusion (noninflammatory): Secondary | ICD-10-CM | POA: Insufficient documentation

## 2024-12-13 LAB — ECHOCARDIOGRAM LIMITED
Area-P 1/2: 3.24 cm2
S' Lateral: 3.33 cm

## 2024-12-14 ENCOUNTER — Ambulatory Visit: Payer: Self-pay | Admitting: Cardiology

## 2024-12-15 ENCOUNTER — Other Ambulatory Visit: Payer: Self-pay | Admitting: Family Medicine

## 2024-12-15 ENCOUNTER — Telehealth: Payer: Self-pay | Admitting: Cardiology

## 2024-12-15 NOTE — Telephone Encounter (Signed)
Patient is returning call to review echo results. °

## 2024-12-15 NOTE — Telephone Encounter (Signed)
 Pt calling back for echo results. Per Thom Sluder, PA-C: Effusion looks better, trivial now. No other abnormalities noted.  Pt asked about fluid on her heart. Educated patient that this was improving and looks better. Pt states she is a diabetic and said she has recently started exercising more and eating a healthier diet. All questions answered at this time.

## 2024-12-20 ENCOUNTER — Other Ambulatory Visit

## 2024-12-20 DIAGNOSIS — E1169 Type 2 diabetes mellitus with other specified complication: Secondary | ICD-10-CM

## 2024-12-20 DIAGNOSIS — E119 Type 2 diabetes mellitus without complications: Secondary | ICD-10-CM

## 2024-12-20 DIAGNOSIS — E1159 Type 2 diabetes mellitus with other circulatory complications: Secondary | ICD-10-CM

## 2024-12-21 ENCOUNTER — Ambulatory Visit: Payer: Self-pay | Admitting: Family Medicine

## 2024-12-21 LAB — LIPID PANEL
Cholesterol: 114 mg/dL
HDL: 51 mg/dL
LDL Cholesterol (Calc): 45 mg/dL
Non-HDL Cholesterol (Calc): 63 mg/dL
Total CHOL/HDL Ratio: 2.2 (calc)
Triglycerides: 93 mg/dL

## 2024-12-21 LAB — CBC WITH DIFFERENTIAL/PLATELET
Absolute Lymphocytes: 2257 {cells}/uL (ref 850–3900)
Absolute Monocytes: 363 {cells}/uL (ref 200–950)
Basophils Absolute: 30 {cells}/uL (ref 0–200)
Basophils Relative: 0.4 %
Eosinophils Absolute: 192 {cells}/uL (ref 15–500)
Eosinophils Relative: 2.6 %
HCT: 41.3 % (ref 35.9–46.0)
Hemoglobin: 13.4 g/dL (ref 11.7–15.5)
MCH: 29.8 pg (ref 27.0–33.0)
MCHC: 32.4 g/dL (ref 31.6–35.4)
MCV: 92 fL (ref 81.4–101.7)
MPV: 11.4 fL (ref 7.5–12.5)
Monocytes Relative: 4.9 %
Neutro Abs: 4558 {cells}/uL (ref 1500–7800)
Neutrophils Relative %: 61.6 %
Platelets: 225 Thousand/uL (ref 140–400)
RBC: 4.49 Million/uL (ref 3.80–5.10)
RDW: 13.1 % (ref 11.0–15.0)
Total Lymphocyte: 30.5 %
WBC: 7.4 Thousand/uL (ref 3.8–10.8)

## 2024-12-21 LAB — COMPREHENSIVE METABOLIC PANEL WITH GFR
AG Ratio: 1.5 (calc) (ref 1.0–2.5)
ALT: 12 U/L (ref 6–29)
AST: 19 U/L (ref 10–35)
Albumin: 4.5 g/dL (ref 3.6–5.1)
Alkaline phosphatase (APISO): 93 U/L (ref 37–153)
BUN/Creatinine Ratio: 22 (calc) (ref 6–22)
BUN: 27 mg/dL — ABNORMAL HIGH (ref 7–25)
CO2: 25 mmol/L (ref 20–32)
Calcium: 9.9 mg/dL (ref 8.6–10.4)
Chloride: 106 mmol/L (ref 98–110)
Creat: 1.24 mg/dL — ABNORMAL HIGH (ref 0.50–1.05)
Globulin: 3.1 g/dL (ref 1.9–3.7)
Glucose, Bld: 127 mg/dL — ABNORMAL HIGH (ref 65–99)
Potassium: 4.4 mmol/L (ref 3.5–5.3)
Sodium: 142 mmol/L (ref 135–146)
Total Bilirubin: 0.6 mg/dL (ref 0.2–1.2)
Total Protein: 7.6 g/dL (ref 6.1–8.1)
eGFR: 48 mL/min/1.73m2 — ABNORMAL LOW

## 2024-12-21 LAB — HEMOGLOBIN A1C
Hgb A1c MFr Bld: 5.7 % — ABNORMAL HIGH
Mean Plasma Glucose: 117 mg/dL
eAG (mmol/L): 6.5 mmol/L

## 2024-12-27 ENCOUNTER — Ambulatory Visit: Admitting: Family Medicine

## 2024-12-27 ENCOUNTER — Encounter: Payer: Self-pay | Admitting: Family Medicine

## 2024-12-27 VITALS — BP 120/62 | HR 76 | Temp 97.9°F | Ht 70.0 in | Wt 167.0 lb

## 2024-12-27 DIAGNOSIS — E785 Hyperlipidemia, unspecified: Secondary | ICD-10-CM

## 2024-12-27 DIAGNOSIS — I152 Hypertension secondary to endocrine disorders: Secondary | ICD-10-CM | POA: Diagnosis not present

## 2024-12-27 DIAGNOSIS — N1831 Chronic kidney disease, stage 3a: Secondary | ICD-10-CM | POA: Diagnosis not present

## 2024-12-27 DIAGNOSIS — Z7985 Long-term (current) use of injectable non-insulin antidiabetic drugs: Secondary | ICD-10-CM | POA: Diagnosis not present

## 2024-12-27 DIAGNOSIS — E1159 Type 2 diabetes mellitus with other circulatory complications: Secondary | ICD-10-CM

## 2024-12-27 DIAGNOSIS — E1122 Type 2 diabetes mellitus with diabetic chronic kidney disease: Secondary | ICD-10-CM

## 2024-12-27 DIAGNOSIS — M810 Age-related osteoporosis without current pathological fracture: Secondary | ICD-10-CM | POA: Diagnosis not present

## 2024-12-27 DIAGNOSIS — Z23 Encounter for immunization: Secondary | ICD-10-CM

## 2024-12-27 DIAGNOSIS — E1169 Type 2 diabetes mellitus with other specified complication: Secondary | ICD-10-CM | POA: Diagnosis not present

## 2024-12-27 DIAGNOSIS — K219 Gastro-esophageal reflux disease without esophagitis: Secondary | ICD-10-CM | POA: Diagnosis not present

## 2024-12-27 DIAGNOSIS — E119 Type 2 diabetes mellitus without complications: Secondary | ICD-10-CM

## 2024-12-27 MED ORDER — TRULICITY 3 MG/0.5ML ~~LOC~~ SOAJ: 3.0000 mg | SUBCUTANEOUS | 1 refills | Status: AC

## 2024-12-27 MED ORDER — FREESTYLE LIBRE 3 PLUS SENSOR MISC: 3 refills | Status: AC

## 2024-12-27 NOTE — Progress Notes (Signed)
 "  Acute Office Visit  Patient ID: Danielle Hurley, female    DOB: August 13, 1961, 64 y.o.   MRN: [id number]  PCP: Kayla Jeoffrey RAMAN, FNP  Chief Complaint  Patient presents with   Follow-up    3 month follow up. BP 113/67 at home this morning per pt. Blood sugar was 82 at 8 this morning.      Subjective:     HPI  Danielle Hurley is here today for chronic condition management. PMH includes arthritis, COPD, HLD, HTN, DM2, GERD, Hepatitis C, and TB.    HLD: on atorvastatin  20mg  daily Lipid Panel     Component Value Date/Time   CHOL 114 12/20/2024 1004   CHOL 133 06/01/2023 0902   TRIG 93 12/20/2024 1004   HDL 51 12/20/2024 1004   HDL 39 (L) 06/01/2023 0902   CHOLHDL 2.2 12/20/2024 1004   LDLCALC 45 12/20/2024 1004   LABVLDL 27 06/01/2023 0902   HTN: on valsartan  320mg  daily, home readings 113/67 Denies chest pain, palpitations, recurrent headaches, vision changes, lightheadedness, dizziness, dyspnea on exertion, or swelling of extremities.   DM2: on amaryl  2mg  daily, insulin  degludec 5 units daily, Trulicity  3mg  weekly, using CGM TIR 97%, TAR 1%, TBR 2%; GMI 6.1%; 5 hypoglycemic events in 90 days Lab Results  Component Value Date   HGBA1C 5.7 (H) 12/20/2024   HGBA1C 5.8 (H) 09/06/2024   HGBA1C 7.2 (H) 06/07/2024   GERD: on pantopraole 20mg  daily Denies hematemesis, melena, hematochezia, occult blood in stool, iron deficiency anemia, anorexia, unexplained weight loss, dysphagia, odynophagia, persistent vomiting   CKD3a. Stable, pushing fluids, avoiding NSAIDs  Discussed the use of AI scribe software for clinical note transcription with the patient, who gave verbal consent to proceed.  History of Present Illness Danielle Hurley is a 64 year old female who presents for management of her blood sugar levels.  Her glucose readings are generally well-controlled, with 97% of the time in range, 1% above range, and 2% below range. She recently experienced a low reading of 69 mg/dL upon  waking, confirmed by a finger stick as 75 mg/dL. Sausage tends to spike her blood sugar significantly. She is currently taking Amaryl  (glimepiride ) once daily in the morning, Trulicity  3 mg weekly, and Tresiba  5 units daily. Reducing Amaryl  to once daily has helped reduce the frequency of low blood sugar episodes. Dark chocolate with over 70% cocoa helps stabilize her blood sugar when it is high.  She has a history of osteoporosis and takes vitamin D3 daily. She reports that a bone scan was performed and that she was told she has osteoporosis. She does not currently take calcium  supplements but has a bottle at home.  She takes atorvastatin  20 mg daily, valsartan , and pantoprazole . She experiences heartburn if she misses pantoprazole  for a couple of days. She does not take ibuprofen but occasionally takes Excedrin.  She mentions a past procedure where a lesion was removed from her back and sent for biopsy, but she has not received the results. She also reports a history of arthritis in her left hip, which causes discomfort.  She had an eye exam in May of the previous year and has not seen an eye doctor since.   Review of Systems  All other systems reviewed and are negative.   Past Medical History:  Diagnosis Date   Arthritis    COPD (chronic obstructive pulmonary disease) (HCC)    Diabetes mellitus without complication (HCC) 1999   Dyspnea    GERD (gastroesophageal reflux  disease)    Hepatitis C    treated 2022   High cholesterol    History of kidney stones    Hypertension 1999   Precordial chest pain 08/09/2024   PET MPI 08/09/24: no ischemia or infarction; EF 70; MBFR 2.68 (normal); no CAC on CT; Low Risk     Tuberculosis    non contagious Dx 05/2020    Past Surgical History:  Procedure Laterality Date   CHOLECYSTECTOMY  1998   COLONOSCOPY  03/22/2023   first colonoscopy   TUBAL LIGATION  01/1995   VIDEO BRONCHOSCOPY WITH ENDOBRONCHIAL NAVIGATION N/A 09/04/2021   Procedure:  VIDEO BRONCHOSCOPY WITH ENDOBRONCHIAL NAVIGATION;  Surgeon: Kerrin Elspeth BROCKS, MD;  Location: [facility] OR;  Service: Thoracic;  Laterality: N/A;    Outpatient Medications Prior to Visit  Medication Sig Dispense Refill   Accu-Chek Softclix Lancets lancets USE 1  TO CHECK GLUCOSE IN THE MORNING AND 1 AT NOON AND 1 AT BEDTIME 100 each 0   amLODipine  (NORVASC ) 5 MG tablet TAKE 1 TABLET (5 MG TOTAL) BY MOUTH DAILY. 90 tablet 0   aspirin-acetaminophen-caffeine (EXCEDRIN MIGRAINE) 250-250-65 MG tablet Take 1 tablet by mouth every 6 (six) hours as needed for headache.     atorvastatin  (LIPITOR) 20 MG tablet TAKE 1 TABLET BY MOUTH EVERY DAY 90 tablet 1   Blood Glucose Monitoring Suppl DEVI 1 each by Does not apply route in the morning, at noon, and at bedtime. May substitute to any manufacturer covered by patient's insurance. 1 each 0   cetirizine (ZYRTEC) 10 MG tablet Take 10 mg by mouth daily as needed for allergies.     Cholecalciferol (VITAMIN D ) 50 MCG (2000 UT) tablet Take 2,000 Units by mouth daily.     ciclopirox  (PENLAC ) 8 % solution Apply topically at bedtime. Apply over nail and surrounding skin. Apply daily over previous coat. After seven (7) days, may remove with alcohol and continue cycle. 6.6 mL 0   cyclobenzaprine  (FLEXERIL ) 5 MG tablet Take 1 tablet (5 mg total) by mouth daily as needed for muscle spasms. for muscle spams 10 tablet 0   glimepiride  (AMARYL ) 2 MG tablet TAKE 1 TABLET (2 MG TOTAL) BY MOUTH IN THE MORNING AND AT BEDTIME. 180 tablet 1   glucose blood (TRUETRACK TEST) test strip Bloodsugar testing QD and prn 100 each 12   Insulin  Degludec FlexTouch 100 UNIT/ML SOPN Inject 5 Units into the skin daily. 100 mL 1   Insulin  Pen Needle (COMFORT TOUCH INSULIN  PEN NEED) 32G X 6 MM MISC 1 Needle by Does not apply route daily. 100 each 3   pantoprazole  (PROTONIX ) 20 MG tablet Take 1 tablet (20 mg total) by mouth daily. 90 tablet 1   valsartan  (DIOVAN ) 320 MG tablet Take 1 tablet (320 mg  total) by mouth daily. 90 tablet 3   Continuous Glucose Sensor (FREESTYLE LIBRE 3 PLUS SENSOR) MISC Change sensor every 15 days. 2 each 3   Dulaglutide  (TRULICITY ) 3 MG/0.5ML SOAJ Inject 3 mg into the skin once a week. 6 mL 1   Continuous Glucose Receiver (FREESTYLE LIBRE 3 READER) DEVI Libre 3 reader 1 each 1   No facility-administered medications prior to visit.    Allergies[1]     Objective:    BP 120/62   Pulse 76   Temp 97.9 F (36.6 C)   Ht 5' 10 (1.778 m)   Wt 167 lb (75.8 kg)   SpO2 93%   BMI 23.96 kg/m    Physical Exam Vitals  and nursing note reviewed.  Constitutional:      Appearance: Normal appearance. She is normal weight.  HENT:     Head: Normocephalic and atraumatic.  Cardiovascular:     Rate and Rhythm: Normal rate and regular rhythm.     Pulses: Normal pulses.     Heart sounds: Normal heart sounds.  Pulmonary:     Effort: Pulmonary effort is normal.     Breath sounds: Normal breath sounds.  Skin:    General: Skin is warm and dry.  Neurological:     General: No focal deficit present.     Mental Status: She is alert and oriented to person, place, and time. Mental status is at baseline.  Psychiatric:        Mood and Affect: Mood normal.        Behavior: Behavior normal.        Thought Content: Thought content normal.        Judgment: Judgment normal.       No results found for any visits on 12/27/24.     Assessment & Plan:   Problem List Items Addressed This Visit       Cardiovascular and Mediastinum   Hypertension associated with diabetes (HCC)   Relevant Medications   Dulaglutide  (TRULICITY ) 3 MG/0.5ML SOAJ     Digestive   Gastroesophageal reflux disease without esophagitis     Endocrine   Hyperlipidemia associated with type 2 diabetes mellitus (HCC)   Relevant Medications   Dulaglutide  (TRULICITY ) 3 MG/0.5ML SOAJ   Diabetes mellitus treated with injections of non-insulin  medication (HCC)   Relevant Medications   Dulaglutide   (TRULICITY ) 3 MG/0.5ML SOAJ     Musculoskeletal and Integument   Osteoporosis without current pathological fracture - Primary   Relevant Orders   Amb Referral to Osteoporosis Management      Genitourinary   Stage 3a chronic kidney disease (HCC)   Other Visit Diagnoses       Need for vaccination       Relevant Orders   Pneumococcal conjugate vaccine 20-valent (Prevnar 20) (Completed)       Assessment and Plan Assessment & Plan Type 2 diabetes mellitus Blood glucose well-controlled. Occasional evening hypoglycemia likely due to medication. - Stopped Tresiba  5 units daily. - Monitor blood glucose levels closely and report if FBG > 150s or post prandial > 200 - Re-evaluate diabetes management in 3 months.  Age-related osteoporosis Osteoporosis confirmed with T-score of -2.8. Referral to orthopedics planned. - Referred to orthopedics for osteoporosis management. - Checked vitamin D  level. - Consider starting weekly bisphosphonate therapy after orthopedic consultation.  Chronic kidney disease Kidney function stable. No NSAID use. - Continue current management and monitor kidney function.  Hyperlipidemia Cholesterol well-controlled with atorvastatin . - Continue atorvastatin  20 mg daily.  Gastroesophageal reflux disease GERD well-managed with pantoprazole . - Continue pantoprazole  20 mg daily.  General health maintenance Pneumonia vaccine due. Eye exam overdue. - Administered pneumonia vaccine. - Scheduled retinal exam. - Encouraged annual eye exam.    Meds ordered this encounter  Medications   Dulaglutide  (TRULICITY ) 3 MG/0.5ML SOAJ    Sig: Inject 3 mg into the skin once a week.    Dispense:  6 mL    Refill:  1    Supervising Provider:   DUANNE LOWERS T [[id number]]   Continuous Glucose Sensor (FREESTYLE LIBRE 3 PLUS SENSOR) MISC    Sig: Change sensor every 15 days.    Dispense:  2 each    Refill:  3  Supervising Provider:   DUANNE LOWERS T [[id number]]    Return  in about 3 months (around 03/27/2025) for annual physical with labs 1 week prior.  Jeoffrey GORMAN Barrio, FNP [facility]      [1] No Known Allergies  "

## 2025-01-05 ENCOUNTER — Telehealth: Payer: Self-pay

## 2025-01-05 NOTE — Telephone Encounter (Signed)
 Copied from CRM [id number]. Topic: General - Other >> Jan 05, 2025  2:09 PM Rosaria E wrote: Reason for CRM: pt called to report that I am continuing my insulin , Amber advised me to stop it but it made too much of a difference and raised my numbers so I put myself back on it   Best contact: [phone]

## 2025-01-08 ENCOUNTER — Encounter: Admitting: Physician Assistant

## 2025-02-13 ENCOUNTER — Ambulatory Visit: Admitting: Podiatry

## 2025-03-29 ENCOUNTER — Other Ambulatory Visit

## 2025-04-05 ENCOUNTER — Encounter: Admitting: Family Medicine

## 2025-04-05 ENCOUNTER — Ambulatory Visit
